# Patient Record
Sex: Male | Born: 1945
Health system: Southern US, Community
[De-identification: ages and names within clinical notes are randomized; demographics above are authoritative.]

## PROBLEM LIST (undated history)

## (undated) DIAGNOSIS — Z86010 Personal history of colonic polyps: Secondary | ICD-10-CM

## (undated) DIAGNOSIS — J449 Chronic obstructive pulmonary disease, unspecified: Secondary | ICD-10-CM

## (undated) DIAGNOSIS — K219 Gastro-esophageal reflux disease without esophagitis: Secondary | ICD-10-CM

## (undated) DIAGNOSIS — M545 Low back pain, unspecified: Secondary | ICD-10-CM

## (undated) DIAGNOSIS — H269 Unspecified cataract: Secondary | ICD-10-CM

## (undated) DIAGNOSIS — K5730 Diverticulosis of large intestine without perforation or abscess without bleeding: Secondary | ICD-10-CM

## (undated) DIAGNOSIS — I10 Essential (primary) hypertension: Secondary | ICD-10-CM

## (undated) DIAGNOSIS — E785 Hyperlipidemia, unspecified: Secondary | ICD-10-CM

## (undated) DIAGNOSIS — J189 Pneumonia, unspecified organism: Secondary | ICD-10-CM

## (undated) HISTORY — DX: Unspecified cataract: H26.9

## (undated) HISTORY — DX: Diverticulosis of large intestine without perforation or abscess without bleeding: K57.30

## (undated) HISTORY — DX: Gastro-esophageal reflux disease without esophagitis: K21.9

## (undated) HISTORY — PX: TONSILLECTOMY: SUR1361

## (undated) HISTORY — DX: Personal history of colonic polyps: Z86.010

## (undated) HISTORY — DX: Chronic obstructive pulmonary disease, unspecified: J44.9

## (undated) HISTORY — DX: Pneumonia, unspecified organism: J18.9

## (undated) HISTORY — PX: ESOPHAGOGASTRODUODENOSCOPY: SHX1529

## (undated) HISTORY — PX: CATARACT EXTRACTION: SUR2

## (undated) HISTORY — PX: COLONOSCOPY: SHX174

## (undated) HISTORY — DX: Hyperlipidemia, unspecified: E78.5

---
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
## 1966-11-07 DIAGNOSIS — J189 Pneumonia, unspecified organism: Secondary | ICD-10-CM

## 1966-11-07 HISTORY — DX: Pneumonia, unspecified organism: J18.9

## 1995-11-08 HISTORY — PX: HERNIA REPAIR: SHX51

## 2003-08-01 ENCOUNTER — Encounter: Payer: Self-pay | Admitting: Internal Medicine

## 2003-08-27 ENCOUNTER — Encounter: Payer: Self-pay | Admitting: General Surgery

## 2003-09-03 ENCOUNTER — Encounter (INDEPENDENT_AMBULATORY_CARE_PROVIDER_SITE_OTHER): Payer: Self-pay | Admitting: *Deleted

## 2003-09-03 ENCOUNTER — Ambulatory Visit (HOSPITAL_COMMUNITY): Admission: RE | Admit: 2003-09-03 | Discharge: 2003-09-03 | Payer: Self-pay | Admitting: General Surgery

## 2005-06-02 ENCOUNTER — Ambulatory Visit: Payer: Self-pay | Admitting: Internal Medicine

## 2005-06-06 ENCOUNTER — Ambulatory Visit: Payer: Self-pay | Admitting: Internal Medicine

## 2005-06-16 ENCOUNTER — Encounter: Admission: RE | Admit: 2005-06-16 | Discharge: 2005-09-14 | Payer: Self-pay | Admitting: Internal Medicine

## 2005-07-07 ENCOUNTER — Ambulatory Visit: Payer: Self-pay | Admitting: Internal Medicine

## 2005-09-23 ENCOUNTER — Ambulatory Visit: Payer: Self-pay | Admitting: Internal Medicine

## 2005-10-03 ENCOUNTER — Ambulatory Visit: Payer: Self-pay | Admitting: Internal Medicine

## 2006-02-06 ENCOUNTER — Ambulatory Visit: Payer: Self-pay | Admitting: Internal Medicine

## 2006-02-13 ENCOUNTER — Ambulatory Visit: Payer: Self-pay | Admitting: Internal Medicine

## 2006-06-15 ENCOUNTER — Ambulatory Visit: Payer: Self-pay | Admitting: Internal Medicine

## 2006-06-27 ENCOUNTER — Ambulatory Visit: Payer: Self-pay | Admitting: Internal Medicine

## 2006-08-23 ENCOUNTER — Ambulatory Visit: Payer: Self-pay | Admitting: Internal Medicine

## 2006-08-23 LAB — CONVERTED CEMR LAB: Hgb A1c MFr Bld: 7.7 % — ABNORMAL HIGH (ref 4.6–6.0)

## 2006-10-05 ENCOUNTER — Ambulatory Visit: Payer: Self-pay | Admitting: Internal Medicine

## 2006-11-13 ENCOUNTER — Ambulatory Visit: Payer: Self-pay | Admitting: Internal Medicine

## 2007-01-30 ENCOUNTER — Ambulatory Visit: Payer: Self-pay | Admitting: Internal Medicine

## 2007-01-30 LAB — CONVERTED CEMR LAB
ALT: 25 units/L (ref 0–40)
AST: 22 units/L (ref 0–37)
BUN: 17 mg/dL (ref 6–23)
Cholesterol: 103 mg/dL (ref 0–200)
Creatinine, Ser: 1 mg/dL (ref 0.4–1.5)
Creatinine,U: 227.4 mg/dL
HDL: 39.2 mg/dL (ref >39.0)
Hgb A1c MFr Bld: 7.8 % — ABNORMAL HIGH (ref 4.6–6.0)
LDL Cholesterol: 46 mg/dL (ref 0–99)
Microalb Creat Ratio: 5.3 mg/g (ref 0.0–30.0)
Microalb, Ur: 1.2 mg/dL (ref 0.0–1.9)
Potassium: 4.3 meq/L (ref 3.5–5.1)
Total CHOL/HDL Ratio: 2.6
Triglycerides: 87 mg/dL (ref 0–149)
VLDL: 17 mg/dL (ref 0–40)

## 2007-07-06 ENCOUNTER — Ambulatory Visit: Payer: Self-pay | Admitting: Internal Medicine

## 2007-07-06 DIAGNOSIS — R972 Elevated prostate specific antigen [PSA]: Secondary | ICD-10-CM | POA: Insufficient documentation

## 2007-07-06 LAB — CONVERTED CEMR LAB
Cholesterol, target level: 200 mg/dL
HDL goal, serum: 40 mg/dL
LDL Goal: 100 mg/dL

## 2007-07-09 ENCOUNTER — Encounter: Payer: Self-pay | Admitting: Internal Medicine

## 2007-07-09 LAB — CONVERTED CEMR LAB
ALT: 24 units/L (ref 0–53)
AST: 19 units/L (ref 0–37)
Albumin: 4.4 g/dL (ref 3.5–5.2)
Alkaline Phosphatase: 91 units/L (ref 39–117)
BUN: 7 mg/dL (ref 6–23)
Basophils Absolute: 0 10*3/uL (ref 0.0–0.1)
Basophils Relative: 0 % (ref 0.0–1.0)
Bilirubin, Direct: 0.1 mg/dL (ref 0.0–0.3)
CO2: 29 meq/L (ref 19–32)
Calcium: 10.7 mg/dL — ABNORMAL HIGH (ref 8.4–10.5)
Chloride: 102 meq/L (ref 96–112)
Cholesterol: 187 mg/dL (ref 0–200)
Creatinine, Ser: 1 mg/dL (ref 0.4–1.5)
Creatinine,U: 57.1 mg/dL
Eosinophils Absolute: 0.1 10*3/uL (ref 0.0–0.6)
Eosinophils Relative: 1.7 % (ref 0.0–5.0)
GFR calc Af Amer: 98 mL/min
GFR calc non Af Amer: 81 mL/min
Glucose, Bld: 203 mg/dL — ABNORMAL HIGH (ref 70–99)
HCT: 49.6 % (ref 39.0–52.0)
HDL: 35.5 mg/dL — ABNORMAL LOW (ref >39.0)
Hemoglobin: 16.6 g/dL (ref 13.0–17.0)
Hgb A1c MFr Bld: 10.2 % — ABNORMAL HIGH (ref 4.6–6.0)
LDL Cholesterol: 130 mg/dL — ABNORMAL HIGH (ref 0–99)
Lymphocytes Relative: 26.1 % (ref 12.0–46.0)
MCHC: 33.5 g/dL (ref 30.0–36.0)
MCV: 87.2 fL (ref 78.0–100.0)
Microalb Creat Ratio: 8.8 mg/g (ref 0.0–30.0)
Microalb, Ur: 0.5 mg/dL (ref 0.0–1.9)
Monocytes Absolute: 0.8 10*3/uL — ABNORMAL HIGH (ref 0.2–0.7)
Monocytes Relative: 11 % (ref 3.0–11.0)
Neutro Abs: 4.7 10*3/uL (ref 1.4–7.7)
Neutrophils Relative %: 61.2 % (ref 43.0–77.0)
Platelets: 248 10*3/uL (ref 150–400)
Potassium: 4.1 meq/L (ref 3.5–5.1)
RBC: 5.69 M/uL (ref 4.22–5.81)
RDW: 13.2 % (ref 11.5–14.6)
Sodium: 139 meq/L (ref 135–145)
TSH: 1.56 microintl units/mL (ref 0.35–5.50)
Total Bilirubin: 0.7 mg/dL (ref 0.3–1.2)
Total CHOL/HDL Ratio: 5.3
Total Protein: 6.9 g/dL (ref 6.0–8.3)
Triglycerides: 110 mg/dL (ref 0–149)
VLDL: 22 mg/dL (ref 0–40)
WBC: 7.6 10*3/uL (ref 4.5–10.5)

## 2007-07-10 ENCOUNTER — Encounter (INDEPENDENT_AMBULATORY_CARE_PROVIDER_SITE_OTHER): Payer: Self-pay | Admitting: *Deleted

## 2007-10-24 ENCOUNTER — Encounter: Payer: Self-pay | Admitting: Internal Medicine

## 2007-11-15 ENCOUNTER — Encounter: Payer: Self-pay | Admitting: Internal Medicine

## 2008-01-09 ENCOUNTER — Encounter: Payer: Self-pay | Admitting: Internal Medicine

## 2008-07-17 ENCOUNTER — Ambulatory Visit: Payer: Self-pay | Admitting: Internal Medicine

## 2008-07-17 DIAGNOSIS — E1165 Type 2 diabetes mellitus with hyperglycemia: Secondary | ICD-10-CM

## 2008-07-17 DIAGNOSIS — E785 Hyperlipidemia, unspecified: Secondary | ICD-10-CM

## 2008-07-17 DIAGNOSIS — IMO0001 Reserved for inherently not codable concepts without codable children: Secondary | ICD-10-CM | POA: Insufficient documentation

## 2008-07-17 DIAGNOSIS — E782 Mixed hyperlipidemia: Secondary | ICD-10-CM | POA: Insufficient documentation

## 2008-07-17 DIAGNOSIS — Z860100 Personal history of colon polyps, unspecified: Secondary | ICD-10-CM

## 2008-07-17 DIAGNOSIS — Z860101 Personal history of adenomatous and serrated colon polyps: Secondary | ICD-10-CM

## 2008-07-17 DIAGNOSIS — I10 Essential (primary) hypertension: Secondary | ICD-10-CM

## 2008-07-17 HISTORY — DX: Personal history of colonic polyps: Z86.010

## 2008-07-17 HISTORY — DX: Personal history of adenomatous and serrated colon polyps: Z86.0101

## 2008-07-17 HISTORY — DX: Personal history of colon polyps, unspecified: Z86.0100

## 2008-07-29 ENCOUNTER — Ambulatory Visit: Payer: Self-pay | Admitting: Internal Medicine

## 2008-08-04 ENCOUNTER — Encounter (INDEPENDENT_AMBULATORY_CARE_PROVIDER_SITE_OTHER): Payer: Self-pay | Admitting: *Deleted

## 2008-08-04 LAB — CONVERTED CEMR LAB
ALT: 20 units/L (ref 0–53)
AST: 18 units/L (ref 0–37)
Albumin: 4 g/dL (ref 3.5–5.2)
Alkaline Phosphatase: 91 units/L (ref 39–117)
BUN: 9 mg/dL (ref 6–23)
Basophils Absolute: 0 10*3/uL (ref 0.0–0.1)
Basophils Relative: 0 % (ref 0.0–3.0)
Bilirubin, Direct: 0.1 mg/dL (ref 0.0–0.3)
Cholesterol: 180 mg/dL (ref 0–200)
Creatinine, Ser: 0.9 mg/dL (ref 0.4–1.5)
Creatinine,U: 241.2 mg/dL
Eosinophils Absolute: 0.1 10*3/uL (ref 0.0–0.7)
Eosinophils Relative: 0.9 % (ref 0.0–5.0)
HCT: 49.8 % (ref 39.0–52.0)
HDL: 34.4 mg/dL — ABNORMAL LOW (ref >39.0)
Hemoglobin: 16.7 g/dL (ref 13.0–17.0)
Hgb A1c MFr Bld: 9.8 % — ABNORMAL HIGH (ref 4.6–6.0)
LDL Cholesterol: 130 mg/dL — ABNORMAL HIGH (ref 0–99)
Lymphocytes Relative: 20.7 % (ref 12.0–46.0)
MCHC: 33.5 g/dL (ref 30.0–36.0)
MCV: 87.9 fL (ref 78.0–100.0)
Microalb Creat Ratio: 25.3 mg/g (ref 0.0–30.0)
Microalb, Ur: 6.1 mg/dL — ABNORMAL HIGH (ref 0.0–1.9)
Monocytes Absolute: 1.1 10*3/uL — ABNORMAL HIGH (ref 0.1–1.0)
Monocytes Relative: 10.7 % (ref 3.0–12.0)
Neutro Abs: 6.8 10*3/uL (ref 1.4–7.7)
Neutrophils Relative %: 67.7 % (ref 43.0–77.0)
Platelets: 238 10*3/uL (ref 150–400)
RBC: 5.67 M/uL (ref 4.22–5.81)
RDW: 13.7 % (ref 11.5–14.6)
TSH: 1.28 microintl units/mL (ref 0.35–5.50)
Total Bilirubin: 0.8 mg/dL (ref 0.3–1.2)
Total CHOL/HDL Ratio: 5.2
Total Protein: 7.1 g/dL (ref 6.0–8.3)
Triglycerides: 79 mg/dL (ref 0–149)
VLDL: 16 mg/dL (ref 0–40)
WBC: 10.1 10*3/uL (ref 4.5–10.5)

## 2008-10-10 ENCOUNTER — Ambulatory Visit: Payer: Self-pay | Admitting: Internal Medicine

## 2008-10-10 DIAGNOSIS — R1319 Other dysphagia: Secondary | ICD-10-CM | POA: Insufficient documentation

## 2008-10-14 ENCOUNTER — Ambulatory Visit (HOSPITAL_COMMUNITY): Admission: RE | Admit: 2008-10-14 | Discharge: 2008-10-14 | Payer: Self-pay | Admitting: Internal Medicine

## 2008-11-11 ENCOUNTER — Encounter: Payer: Self-pay | Admitting: Internal Medicine

## 2008-11-12 ENCOUNTER — Encounter: Payer: Self-pay | Admitting: Internal Medicine

## 2008-11-12 ENCOUNTER — Ambulatory Visit: Payer: Self-pay | Admitting: Internal Medicine

## 2008-11-18 ENCOUNTER — Encounter: Payer: Self-pay | Admitting: Internal Medicine

## 2009-01-12 ENCOUNTER — Telehealth (INDEPENDENT_AMBULATORY_CARE_PROVIDER_SITE_OTHER): Payer: Self-pay | Admitting: *Deleted

## 2009-01-21 ENCOUNTER — Encounter (INDEPENDENT_AMBULATORY_CARE_PROVIDER_SITE_OTHER): Payer: Self-pay | Admitting: *Deleted

## 2009-03-24 ENCOUNTER — Emergency Department (HOSPITAL_COMMUNITY): Admission: EM | Admit: 2009-03-24 | Discharge: 2009-03-24 | Payer: Self-pay | Admitting: Family Medicine

## 2009-03-28 ENCOUNTER — Emergency Department (HOSPITAL_COMMUNITY): Admission: EM | Admit: 2009-03-28 | Discharge: 2009-03-28 | Payer: Self-pay | Admitting: Family Medicine

## 2009-04-29 ENCOUNTER — Telehealth (INDEPENDENT_AMBULATORY_CARE_PROVIDER_SITE_OTHER): Payer: Self-pay | Admitting: *Deleted

## 2009-05-07 ENCOUNTER — Telehealth (INDEPENDENT_AMBULATORY_CARE_PROVIDER_SITE_OTHER): Payer: Self-pay | Admitting: *Deleted

## 2009-05-07 ENCOUNTER — Ambulatory Visit: Payer: Self-pay | Admitting: Internal Medicine

## 2009-05-19 ENCOUNTER — Encounter (INDEPENDENT_AMBULATORY_CARE_PROVIDER_SITE_OTHER): Payer: Self-pay | Admitting: *Deleted

## 2009-05-19 LAB — CONVERTED CEMR LAB
ALT: 19 units/L (ref 0–53)
AST: 22 units/L (ref 0–37)
Albumin: 4 g/dL (ref 3.5–5.2)
Alkaline Phosphatase: 100 units/L (ref 39–117)
BUN: 11 mg/dL (ref 6–23)
Basophils Absolute: 0 10*3/uL (ref 0.0–0.1)
Basophils Relative: 0 % (ref 0.0–3.0)
Bilirubin, Direct: 0.1 mg/dL (ref 0.0–0.3)
CO2: 27 meq/L (ref 19–32)
Calcium: 9.8 mg/dL (ref 8.4–10.5)
Chloride: 106 meq/L (ref 96–112)
Cholesterol: 115 mg/dL (ref 0–200)
Creatinine, Ser: 0.8 mg/dL (ref 0.4–1.5)
Creatinine,U: 165.2 mg/dL
Eosinophils Absolute: 0.2 10*3/uL (ref 0.0–0.7)
Eosinophils Relative: 1.9 % (ref 0.0–5.0)
GFR calc non Af Amer: 103.57 mL/min (ref >60)
Glucose, Bld: 139 mg/dL — ABNORMAL HIGH (ref 70–99)
HCT: 44.3 % (ref 39.0–52.0)
HDL: 40.5 mg/dL (ref >39.00)
Hemoglobin: 15.4 g/dL (ref 13.0–17.0)
Hgb A1c MFr Bld: 9 % — ABNORMAL HIGH (ref 4.6–6.5)
LDL Cholesterol: 59 mg/dL (ref 0–99)
Lymphocytes Relative: 23.4 % (ref 12.0–46.0)
Lymphs Abs: 1.9 10*3/uL (ref 0.7–4.0)
MCHC: 34.8 g/dL (ref 30.0–36.0)
MCV: 85.5 fL (ref 78.0–100.0)
Microalb Creat Ratio: 38.7 mg/g — ABNORMAL HIGH (ref 0.0–30.0)
Microalb, Ur: 6.4 mg/dL — ABNORMAL HIGH (ref 0.0–1.9)
Monocytes Absolute: 0.8 10*3/uL (ref 0.1–1.0)
Monocytes Relative: 9.3 % (ref 3.0–12.0)
Neutro Abs: 5.4 10*3/uL (ref 1.4–7.7)
Neutrophils Relative %: 65.4 % (ref 43.0–77.0)
Platelets: 219 10*3/uL (ref 150.0–400.0)
Potassium: 4.2 meq/L (ref 3.5–5.1)
RBC: 5.18 M/uL (ref 4.22–5.81)
RDW: 14.5 % (ref 11.5–14.6)
Sodium: 141 meq/L (ref 135–145)
TSH: 1.01 microintl units/mL (ref 0.35–5.50)
Total Bilirubin: 0.7 mg/dL (ref 0.3–1.2)
Total CHOL/HDL Ratio: 3
Total Protein: 6.9 g/dL (ref 6.0–8.3)
Triglycerides: 77 mg/dL (ref 0.0–149.0)
VLDL: 15.4 mg/dL (ref 0.0–40.0)
WBC: 8.3 10*3/uL (ref 4.5–10.5)

## 2009-05-22 ENCOUNTER — Ambulatory Visit: Payer: Self-pay | Admitting: Internal Medicine

## 2009-05-22 DIAGNOSIS — K219 Gastro-esophageal reflux disease without esophagitis: Secondary | ICD-10-CM | POA: Insufficient documentation

## 2009-08-25 ENCOUNTER — Telehealth (INDEPENDENT_AMBULATORY_CARE_PROVIDER_SITE_OTHER): Payer: Self-pay | Admitting: *Deleted

## 2009-09-28 ENCOUNTER — Telehealth (INDEPENDENT_AMBULATORY_CARE_PROVIDER_SITE_OTHER): Payer: Self-pay | Admitting: *Deleted

## 2009-11-19 ENCOUNTER — Telehealth: Payer: Self-pay | Admitting: Internal Medicine

## 2009-11-20 ENCOUNTER — Encounter (INDEPENDENT_AMBULATORY_CARE_PROVIDER_SITE_OTHER): Payer: Self-pay | Admitting: *Deleted

## 2009-12-01 ENCOUNTER — Encounter (INDEPENDENT_AMBULATORY_CARE_PROVIDER_SITE_OTHER): Payer: Self-pay | Admitting: *Deleted

## 2009-12-02 ENCOUNTER — Ambulatory Visit: Payer: Self-pay | Admitting: Internal Medicine

## 2009-12-02 ENCOUNTER — Encounter (INDEPENDENT_AMBULATORY_CARE_PROVIDER_SITE_OTHER): Payer: Self-pay | Admitting: *Deleted

## 2009-12-14 ENCOUNTER — Ambulatory Visit: Payer: Self-pay | Admitting: Internal Medicine

## 2009-12-18 ENCOUNTER — Encounter: Payer: Self-pay | Admitting: Internal Medicine

## 2010-01-04 ENCOUNTER — Telehealth (INDEPENDENT_AMBULATORY_CARE_PROVIDER_SITE_OTHER): Payer: Self-pay | Admitting: *Deleted

## 2010-01-12 ENCOUNTER — Telehealth (INDEPENDENT_AMBULATORY_CARE_PROVIDER_SITE_OTHER): Payer: Self-pay | Admitting: *Deleted

## 2010-01-28 ENCOUNTER — Encounter: Payer: Self-pay | Admitting: Internal Medicine

## 2010-02-10 ENCOUNTER — Telehealth: Payer: Self-pay | Admitting: Internal Medicine

## 2010-04-12 ENCOUNTER — Telehealth (INDEPENDENT_AMBULATORY_CARE_PROVIDER_SITE_OTHER): Payer: Self-pay | Admitting: *Deleted

## 2010-08-17 ENCOUNTER — Encounter: Payer: Self-pay | Admitting: Internal Medicine

## 2010-08-19 ENCOUNTER — Telehealth (INDEPENDENT_AMBULATORY_CARE_PROVIDER_SITE_OTHER): Payer: Self-pay | Admitting: *Deleted

## 2010-10-11 ENCOUNTER — Encounter: Payer: Self-pay | Admitting: Internal Medicine

## 2010-10-14 ENCOUNTER — Ambulatory Visit: Payer: Self-pay | Admitting: Internal Medicine

## 2010-10-14 ENCOUNTER — Encounter: Payer: Self-pay | Admitting: Internal Medicine

## 2010-10-14 DIAGNOSIS — I455 Other specified heart block: Secondary | ICD-10-CM | POA: Insufficient documentation

## 2010-10-15 LAB — CONVERTED CEMR LAB
ALT: 19 units/L (ref 0–53)
AST: 19 units/L (ref 0–37)
Albumin: 4.1 g/dL (ref 3.5–5.2)
Basophils Relative: 0.1 % (ref 0.0–3.0)
Cholesterol: 121 mg/dL (ref 0–200)
Eosinophils Relative: 0 % (ref 0.0–5.0)
GFR calc non Af Amer: 59.94 mL/min — ABNORMAL LOW (ref >60.00)
HCT: 45.9 % (ref 39.0–52.0)
HDL: 31.3 mg/dL — ABNORMAL LOW (ref >39.00)
Hemoglobin: 15.7 g/dL (ref 13.0–17.0)
Lymphs Abs: 0.8 10*3/uL (ref 0.7–4.0)
Monocytes Relative: 9.2 % (ref 3.0–12.0)
Neutro Abs: 8.8 10*3/uL — ABNORMAL HIGH (ref 1.4–7.7)
Potassium: 3.5 meq/L (ref 3.5–5.1)
RBC: 5.27 M/uL (ref 4.22–5.81)
TSH: 1.75 microintl units/mL (ref 0.35–5.50)
Total Bilirubin: 0.6 mg/dL (ref 0.3–1.2)
Total CHOL/HDL Ratio: 4
Triglycerides: 170 mg/dL — ABNORMAL HIGH (ref 0.0–149.0)
WBC: 10.6 10*3/uL — ABNORMAL HIGH (ref 4.5–10.5)

## 2010-10-16 ENCOUNTER — Emergency Department (HOSPITAL_COMMUNITY)
Admission: EM | Admit: 2010-10-16 | Discharge: 2010-10-16 | Payer: Self-pay | Source: Home / Self Care | Admitting: Emergency Medicine

## 2010-10-19 ENCOUNTER — Encounter: Payer: Self-pay | Admitting: Internal Medicine

## 2010-10-21 ENCOUNTER — Encounter: Payer: Self-pay | Admitting: Cardiology

## 2010-10-22 ENCOUNTER — Ambulatory Visit: Payer: Self-pay | Admitting: Cardiology

## 2010-10-22 DIAGNOSIS — R9431 Abnormal electrocardiogram [ECG] [EKG]: Secondary | ICD-10-CM

## 2010-10-22 DIAGNOSIS — R079 Chest pain, unspecified: Secondary | ICD-10-CM

## 2010-11-02 ENCOUNTER — Telehealth (INDEPENDENT_AMBULATORY_CARE_PROVIDER_SITE_OTHER): Payer: Self-pay | Admitting: *Deleted

## 2010-11-03 ENCOUNTER — Encounter (HOSPITAL_COMMUNITY)
Admission: RE | Admit: 2010-11-03 | Discharge: 2010-12-07 | Payer: Self-pay | Source: Home / Self Care | Attending: Cardiology | Admitting: Cardiology

## 2010-11-03 ENCOUNTER — Ambulatory Visit (HOSPITAL_COMMUNITY)
Admission: RE | Admit: 2010-11-03 | Discharge: 2010-11-03 | Payer: Self-pay | Source: Home / Self Care | Attending: Cardiology | Admitting: Cardiology

## 2010-11-03 ENCOUNTER — Encounter: Payer: Self-pay | Admitting: Cardiology

## 2010-11-03 ENCOUNTER — Ambulatory Visit: Payer: Self-pay

## 2010-11-11 ENCOUNTER — Telehealth: Payer: Self-pay | Admitting: Cardiology

## 2010-11-18 ENCOUNTER — Ambulatory Visit: Admission: RE | Admit: 2010-11-18 | Discharge: 2010-11-18 | Payer: Self-pay | Source: Home / Self Care

## 2010-12-07 NOTE — Assessment & Plan Note (Signed)
Summary: CPX/FASTING//KN   Vital Signs:  Patient profile:   65 year old male Height:      69.25 inches Weight:      239.8 pounds BMI:     35.28 Temp:     98.0 degrees F oral Pulse rate:   76 / minute Resp:     16 per minute BP sitting:   116 / 68  (left arm) Cuff size:   large  Vitals Entered By: Shonna Chock CMA (October 14, 2010 10:52 AM) CC: CPX with fasting labs , discuss pneumonia vaccine, Type 2 diabetes mellitus follow-up Comments Refused Flu Vaccine     Primary Care Provider:  Marga Melnick, MD  CC:  CPX with fasting labs , discuss pneumonia vaccine, and Type 2 diabetes mellitus follow-up.  History of Present Illness: Here for Medicare AWV: 1.Risk factors based on Past M, S, F history:DM; HTN; Dyslipidemia; ERD ( chart updated) 2.Physical Activities: no regular exercise 3.Depression/mood: no issues 4.Hearing: whisper heard @ 6 ft 5.ADL's: no limitations 6.Fall Risk: no issues 7.Home Safety: no risk  8.Height, weight, &visual acuity:wall chart read @ 6 ft w/o lenses 9.Counseling: POA & Living Will not in place  10.Labs ordered based on risk factors: see Orders  11.  Referral Coordination: none requested 12. Care Plan: see Instructions 13.Cognitive Assessment: Oriented X 3 ; memory & recall  excellent  ; "WORLD" spelled  backwards ; mood & affect normal. Type 2 Diabetes Mellitus Follow-Up:    The patient reports  rare self managed hypoglycemia, but denies polyuria, polydipsia, blurred vision, weight loss, weight gain, and numbness of extremities.  Other symptoms include orthostatic symptoms after squatting.  The patient denies the following symptoms: neuropathic pain, chest pain, vomiting, poor wound healing, intermittent claudication, vision loss, and foot ulcer.  Since the last visit the patient reports  fair  dietary compliance.  The patient has been measuring capillary blood glucose before breakfast , 130 on aveage and 2 hrs after dinner, 175-200.Last A1c 8% in  09/11 @ Dr Willeen Cass.  Since the last visit, the patient reports having had eye care by an ophthalmologist ( no retinopathy) but  no foot care.   Hyperlipidemia Follow-Up: The patient denies muscle aches, GI upset, abdominal pain, flushing, itching, constipation, diarrhea, and fatigue.  Other symptoms include syncope X 1 in context of choking  due to coughing. PMH of esophageal stricture, S/P dilation. The patient denies the following symptoms: dypsnea, palpitations, and pedal edema.  Compliance with medications (by patient report) has been near 100%.  Adjunctive measures currently used by the patient include ASA and fish oil supplements.    Preventive Screening-Counseling & Management  Alcohol-Tobacco     Alcohol drinks/day: < 1     Smoking Status: quit > 6 months     Year Started: 1996     Cigars/week: 10 / week     Tobacco Counseling: to quit use of tobacco products  Caffeine-Diet-Exercise     Caffeine use/day: 1 cup     Diet Comments: low carb  Hep-HIV-STD-Contraception     Dental Visit-last 6 months no     Dental Care Counseling: dentures     Sun Exposure-Excessive: no  Safety-Violence-Falls     Seat Belt Use: yes     Smoke Detectors: yes      Blood Transfusions:  no.        Travel History:  [place] 2008.    Current Medications (verified): 1)  Novolog Mix 70/30 70-30 % Susp (Insulin Aspart  Prot & Aspart) .... 40 Units Am 30 Units Pm 2)  Metformin Hcl 1000 Mg  Tabs (Metformin Hcl) .Marland Kitchen.. 1 By Mouth Two Times A Day With Meals **appointment Due** 3)  Freestyle Test   Strp (Glucose Blood) .... Test Twice Daily 4)  Ramipril 10 Mg  Caps (Ramipril) .... Take One Capsule Daily 5)  Adult Aspirin Low Strength 81 Mg Tbdp (Aspirin) .Marland Kitchen.. 1 By Mouth Once Daily 6)  Multivitamins   Tabs (Multiple Vitamin) .Marland Kitchen.. 1 By Mouth Once Daily 7)  Fish Oil 1000 Mg Caps (Omega-3 Fatty Acids) .Marland Kitchen.. 1 By Mouth Once Daily 8)  Vytorin 10-20 Mg Tabs (Ezetimibe-Simvastatin) .... Take As Directed **labs Due** 9)   Prilosec 40 Mg  Cpdr (Omeprazole) .Marland Kitchen.. 1 Each Day 30 Minutes Before Breakfast 10)  Losartan Potassium-Hctz 100-25 Mg Tabs (Losartan Potassium-Hctz) .Marland Kitchen.. 1 By Mouth Once Daily  Allergies (verified): No Known Drug Allergies  Past History:  Past Medical History: Elevated PSA ( max 9.5), Dr Retta Diones Diabetes mellitus, type II, Dr Talmage Nap Hyperlipidemia Hypertension Colonic polyps, PMH  of 2004,2010, Dr Leone Payor Diverticulosis, colon [phone] GERD with erosive esophagitis & gastritis @ Endo 2010  Past Surgical History: Prostate biopsy X 3, Dr Retta Diones ;Coccyxectomy Appendectomy Colon polypectomy 2004, 2010; Esophageal Dilation 11/2008, Dr Leone Payor Inguinal herniorrhaphy Rotator cuff repair Tonsillectomy  Family History: Father: CVA in  late 60s,CHF Mother: Alzheimer's Siblings: bro: DM ,  PTE  post  gastric bypass; sister :DDD  Social History: Married with 2 children (boys) Occupation: Systems developer Current Smoker: 10 cigars / week Alcohol use-yes Smoking Status:  quit > 6 months Caffeine use/day:  1 cup Dental Care w/in 6 mos.:  no Sun Exposure-Excessive:  no Seat Belt Use:  yes Blood Transfusions:  no  Review of Systems  The patient denies anorexia, fever, vision loss, decreased hearing, hoarseness, prolonged cough, hemoptysis, melena, hematochezia, severe indigestion/heartburn, suspicious skin lesions, unusual weight change, abnormal bleeding, enlarged lymph nodes, and angioedema.    Physical Exam  General:  alert,appropriate and cooperative throughout examination Head:  Normocephalic and atraumatic without obvious abnormalities. No apparent alopecia ; moustache & beard Eyes:  No corneal or conjunctival inflammation noted. Perrla. Funduscopic exam benign, without hemorrhages, exudates or papilledema.  Ears:  External ear exam shows no significant lesions or deformities.  Otoscopic examination reveals clear canals, tympanic membranes are intact bilaterally  without bulging, retraction, inflammation or discharge. Hearing is grossly normal bilaterally. Nose:  External nasal examination shows no deformity or inflammation. Nasal mucosa are pink and moist without lesions or exudates. Mouth:  Oral mucosa and oropharynx without lesions or exudates.   Dentures. Marked pharyngeal erythema.   Neck:  No deformities, masses, or tenderness noted. Lungs:  Normal respiratory effort, chest expands symmetrically. Lungs are clear to auscultation, no crackles or wheezes. Heart:  Normal rate and regular rhythm. S1 and S2 normal without gallop, murmur, click, rub. S4 Abdomen:  Bowel sounds positive,abdomen soft and non-tender without masses, organomegaly or hernias noted. Genitalia:  Dr  Retta Diones Msk:  No deformity or scoliosis noted of thoracic or lumbar spine.   Pulses:  R and L carotid,radial,dorsalis pedis and posterior tibial pulses are full and equal bilaterally Extremities:  No clubbing, cyanosis, edema. Chronic finger & toenail fungal changes Neurologic:  alert & oriented X3, sensation intact to light touch over feet, and DTRs symmetrical and normal.   Skin:  Soles dry. Tatoo LUE Cervical Nodes:  No lymphadenopathy noted Axillary Nodes:  No palpable lymphadenopathy Psych:  memory intact for  recent and remote, normally interactive, and good eye contact.     Impression & Recommendations:  Problem # 1:  PREVENTIVE HEALTH CARE (ICD-V70.0)  Orders: Welcome to [employer], Physical [phone])  Problem # 2:  OTHER HEART BLOCK (ICD-426.6)  Bifasicular block, new LAD since 2007  Orders: EKG w/ Interpretation (93000) Cardiology Referral (Cardiology)  Problem # 3:  GERD (ICD-530.81)  controlled His updated medication list for this problem includes:    Prilosec 40 Mg Cpdr (Omeprazole) .Marland Kitchen... 1 each day 30 minutes before breakfast  Orders: Venipuncture (60454) TLB-CBC Platelet - w/Differential (85025-CBCD) Specimen Handling (09811)  Problem # 4:  DIABETES  MELLITUS, TYPE II, UNCONTROLLED (ICD-250.02) as per Dr Talmage Nap His updated medication list for this problem includes:    Novolog Mix 70/30 70-30 % Susp (Insulin aspart prot & aspart) .Marland KitchenMarland KitchenMarland KitchenMarland Kitchen 40 units am 30 units pm    Metformin Hcl 1000 Mg Tabs (Metformin hcl) .Marland Kitchen... 1 by mouth two times a day with meals    Ramipril 10 Mg Caps (Ramipril) .Marland Kitchen... Take one capsule daily    Adult Aspirin Low Strength 81 Mg Tbdp (Aspirin) .Marland Kitchen... 1 by mouth once daily    Losartan Potassium-hctz 100-25 Mg Tabs (Losartan potassium-hctz) .Marland Kitchen... 1 by mouth once daily  Problem # 5:  HYPERTENSION (ICD-401.9)  controlled His updated medication list for this problem includes:    Ramipril 10 Mg Caps (Ramipril) .Marland Kitchen... Take one capsule daily    Losartan Potassium-hctz 100-25 Mg Tabs (Losartan potassium-hctz) .Marland Kitchen... 1 by mouth once daily  Orders: TLB-BMP (Basic Metabolic Panel-BMET) (80048-METABOL) Specimen Handling (91478)  Problem # 6:  HYPERLIPIDEMIA (ICD-272.4)  His updated medication list for this problem includes:    Vytorin 10-20 Mg Tabs (Ezetimibe-simvastatin) .Marland Kitchen... Take as directed  Orders: Venipuncture (29562) TLB-Lipid Panel (80061-LIPID) TLB-Hepatic/Liver Function Pnl (80076-HEPATIC) TLB-TSH (Thyroid Stimulating Hormone) (84443-TSH) Specimen Handling (13086)  Problem # 7:  ELEVATED PROSTATE SPECIFIC ANTIGEN (ICD-790.93) as per Dr Retta Diones  Complete Medication List: 1)  Novolog Mix 70/30 70-30 % Susp (Insulin aspart prot & aspart) .... 40 units am 30 units pm 2)  Metformin Hcl 1000 Mg Tabs (Metformin hcl) .Marland Kitchen.. 1 by mouth two times a day with meals 3)  Freestyle Test Strp (Glucose blood) .... Test twice daily 4)  Ramipril 10 Mg Caps (Ramipril) .... Take one capsule daily 5)  Adult Aspirin Low Strength 81 Mg Tbdp (Aspirin) .Marland Kitchen.. 1 by mouth once daily 6)  Multivitamins Tabs (Multiple vitamin) .Marland Kitchen.. 1 by mouth once daily 7)  Fish Oil 1000 Mg Caps (Omega-3 fatty acids) .Marland Kitchen.. 1 by mouth once daily 8)  Vytorin 10-20  Mg Tabs (Ezetimibe-simvastatin) .... Take as directed 9)  Prilosec 40 Mg Cpdr (Omeprazole) .Marland Kitchen.. 1 each day 30 minutes before breakfast 10)  Losartan Potassium-hctz 100-25 Mg Tabs (Losartan potassium-hctz) .Marland Kitchen.. 1 by mouth once daily  Patient Instructions: 1)  Consider POA & Living Will. Annual Flu shot recommended. 2)  Stop Smoking Tips: Choose a Quit date. Cut down before the Quit date. decide what you will do as a substitute when you feel the urge to smoke(gum,toothpick,exercise). 3)  It is important that you exercise regularly at least 20 minutes 5 times a week. If you develop chest pain, have severe difficulty breathing, or feel very tired , stop exercising immediately and seek medical attention. 4)  Check your feet each night for sore areas, calluses or signs of infection.Call if Podiatry referral desired. 5)  Check your Blood Pressure regularly. If it is above: 135/85 ON AVERAGE  you should  make an appointment. Prescriptions: METFORMIN HCL 1000 MG  TABS (METFORMIN HCL) 1 by mouth two times a day with Meals  #180 x 3   Entered and Authorized by:   Marga Melnick MD   Signed by:   Marga Melnick MD on 10/14/2010   Method used:   Print then Give to Patient   RxID:   [id number] VYTORIN 10-20 MG TABS (EZETIMIBE-SIMVASTATIN) Take as directed  #90 x 3   Entered and Authorized by:   Marga Melnick MD   Signed by:   Marga Melnick MD on 10/14/2010   Method used:   Print then Give to Patient   RxID:   [id number]    Orders Added: 1)  Welcome to Medicare, Physical [G0402] 2)  Est. Patient Level III [84132] 3)  EKG w/ Interpretation [93000] 4)  Venipuncture [36415] 5)  TLB-Lipid Panel [80061-LIPID] 6)  TLB-BMP (Basic Metabolic Panel-BMET) [80048-METABOL] 7)  TLB-CBC Platelet - w/Differential [85025-CBCD] 8)  TLB-Hepatic/Liver Function Pnl [80076-HEPATIC] 9)  TLB-TSH (Thyroid Stimulating Hormone) [84443-TSH] 10)  Specimen Handling [99000] 11)  Cardiology Referral  [Cardiology]

## 2010-12-07 NOTE — Letter (Signed)
Summary: Diabetic Instructions  [facility]  [address] [place], [place] [address]   Phone: [phone]  Fax: [phone]    Karl Lawson 1946/07/03 MRN: [id number]   _  _   ORAL DIABETIC MEDICATION INSTRUCTIONS  The day before your procedure:   Take your diabetic pill as you do normally  The day of your procedure:   Do not take your diabetic pill    We will check your blood sugar levels during the admission process and again in Recovery before discharging you home  ________________________________________________________________________  _  _   INSULIN (LONG ACTING) MEDICATION INSTRUCTIONS (Lantus, NPH, 70/30, Humulin, Novolin-N)   The day before your procedure:   Take  your regular evening dose    The day of your procedure:   Do not take your morning dose

## 2010-12-07 NOTE — Progress Notes (Signed)
Summary: Refill Request  Phone Note Refill Request Call back at [phone] Message from:  Pharmacy on April 12, 2010 4:14 PM  Refills Requested: Medication #1:  VYTORIN 10-20 MG TABS Take as directed   Dosage confirmed as above?Dosage Confirmed   [facility] [address]  Initial call taken by: Harold Barban,  April 12, 2010 4:14 PM    Prescriptions: VYTORIN 10-20 MG TABS (EZETIMIBE-SIMVASTATIN) Take as directed  #90 x 0   Entered by:   Shonna Chock   Authorized by:   Marga Melnick MD   Signed by:   Shonna Chock on 04/12/2010   Method used:   [facility]        [facility]  [address] [phone][facility] ([facility]ail)       [address]       [place], [place]  [address]       Ph: [phone] or [phone]       Fax: [phone]   RxID:   [id number]

## 2010-12-07 NOTE — Progress Notes (Signed)
Summary: vytorin refil   Phone Note Refill Request Message from:  Patient on January 12, 2010 3:40 PM  Refills Requested: Medication #1:  VYTORIN 10-20 MG TABS Take as directed [facility] [address]   Initial call taken by: Doristine Devoid,  January 12, 2010 3:40 PM    Prescriptions: VYTORIN 10-20 MG TABS (EZETIMIBE-SIMVASTATIN) Take as directed  #90 x 0   Entered by:   Doristine Devoid   Authorized by:   Marga Melnick MD   Signed by:   Doristine Devoid on 01/12/2010   Method used:   [facility]        [facility]  [address] [phone]* (retail)       [address]       [place], [place]  [address]       Ph: [phone] or [phone]       Fax: [phone]   RxID:   [id number]

## 2010-12-07 NOTE — Progress Notes (Signed)
Summary: metformin refill   Phone Note Refill Request Message from:  Patient on January 04, 2010 4:34 PM  Refills Requested: Medication #1:  METFORMIN HCL 1000 MG  TABS TAKE 1 TAB TWICE A DAY WITH MEALS Initial call taken by: Doristine Devoid,  January 04, 2010 4:34 PM    Prescriptions: METFORMIN HCL 1000 MG  TABS (METFORMIN HCL) TAKE 1 TAB TWICE A DAY WITH MEALS  #180 x 1   Entered by:   Doristine Devoid   Authorized by:   Marga Melnick MD   Signed by:   Doristine Devoid on 01/04/2010   Method used:   [facility]        [facility]  [address] [phone]* (retail)       [address]       [place], [place]  [address]       Ph: [phone] or [phone]       Fax: [phone]   RxID:   [id number]

## 2010-12-07 NOTE — Progress Notes (Signed)
Summary: Schedule Endoscopy  Phone Note Outgoing Call Call back at [phone]   Call placed by: Harlow Mares CMA Duncan Dull),  November 19, 2009 10:49 AM Call placed to: Patient Summary of Call: called the patient on his ceel and he will call back to schedule a direct EGD  tomorrow when he has his schedule he is on insulin but can be a direct since the previsit nurses has standing insulin orders.  Initial call taken by: Harlow Mares CMA Duncan Dull),  November 19, 2009 10:54 AM  Follow-up for Phone Call        patient schedule egd note to use standing orders for insulin. Follow-up by: Harlow Mares CMA (AAMA),  November 20, 2009 11:10 AM

## 2010-12-07 NOTE — Procedures (Signed)
Summary: Upper Endoscopy  Patient: Karl Lawson Note: All result statuses are Final unless otherwise noted.  Tests: (1) Upper Endoscopy (EGD)   EGD Upper Endoscopy       DONE     [facility]     [address]     [place], [place]  [address]           ENDOSCOPY PROCEDURE REPORT           PATIENT:  Karl Lawson, Karl Lawson  MR#:  [id number]     BIRTHDATE:  [birth year], 63 yrs. old  GENDER:  male           ENDOSCOPIST:  Iva Boop, MD, [facility]           PROCEDURE DATE:  12/14/2009     PROCEDURE:  EGD with biopsy     ASA CLASS:  Class II     INDICATIONS:  GERD, Screening for Barrett's esopahgus in a GERD     patient. 11/2008 reflux esophagitis and stricture, now asymptomatic     for assessment of healing and to look for possible underlying     Barrett's esophagus           MEDICATIONS:   Fentanyl 50 mcg IV, Versed 6 mg IV     TOPICAL ANESTHETIC:  Exactacain Spray           DESCRIPTION OF PROCEDURE:   After the risks benefits and     alternatives of the procedure were thoroughly explained, informed     consent was obtained.  The LB GIF-H180 [id number] endoscope was     introduced through the mouth and advanced to the second portion of     the duodenum, without limitations.  The instrument was slowly     withdrawn as the mucosa was fully examined.     <<PROCEDUREIMAGES>>           There were columnar mucosal changes consistent with Barrett's     esophagus identified. in the distal esophagus. 39-40 cm, 1 tongue     seen Multiple biopsies were obtained and sent to pathology.  A     hiatal hernia was found. It was 2 cm in size. 40-42 cm, sliding.     Otherwise the examination was normal.    Retroflexed views revealed     a hiatal hernia.    The scope was then withdrawn from the patient     and the procedure completed.           COMPLICATIONS:  None           ENDOSCOPIC IMPRESSION:     1) Columnar mucosa, ? Barrett's in the distal esophagus (1cm     tongue - biopsied)     2) 2 cm  hiatal hernia     3) Otherwise normal examination           RECOMMENDATIONS:  Continue omeprazole 40 mg daily for GERD     Barrett's esophagus handout given today           REPEAT EXAM:  In for EGD, pending biopsy results.           Iva Boop, MD, [facility]           CC:  The Patient           n.     eSIGNED:   Iva Boop at 12/14/2009 02:01 PM           Loma Messing, [phone]  Note: An exclamation mark (!) indicates a result that was not dispersed into the flowsheet. Document Creation Date: 12/14/2009 2:01 PM _______________________________________________________________________  (1) Order result status: Final Collection or observation date-time: 12/14/2009 13:52 Requested date-time:  Receipt date-time:  Reported date-time:  Referring Physician:   Ordering Physician: Stan Head [id number]) Specimen Source:  Source: Launa Grill Order Number: [id number] Lab site:   Appended Document: Upper Endoscopy     Procedures Next Due Date:    EGD: 12/2010

## 2010-12-07 NOTE — Letter (Signed)
Summary: EGD Instructions  [facility]  [address] [place], [place] [address]   Phone: [phone]  Fax: [phone]       Karl Lawson    1946/09/19    MRN: [id number]       Procedure Day /Date:  Monday 02/07     Arrival Time:  12:30pm     Procedure Time: 1:30pm     Location of Procedure:                    _ X _ [facility] (4th Floor)    PREPARATION FOR ENDOSCOPY   On  Monday 02/07  THE DAY OF THE PROCEDURE:  1.   No solid foods, milk or milk products are allowed after midnight the night before your procedure.  2.   Do not drink anything colored red or purple.  Avoid juices with pulp.  No orange juice.  3.  You may drink clear liquids until 11:30pm  which is 2 hours before your procedure.                                                                                                CLEAR LIQUIDS INCLUDE: Water Jello Ice Popsicles Tea (sugar ok, no milk/cream) Powdered fruit flavored drinks Coffee (sugar ok, no milk/cream) Gatorade Juice: apple, white grape, white cranberry  Lemonade Clear bullion, consomm, broth Carbonated beverages (any kind) Strained chicken noodle soup Hard Candy   MEDICATION INSTRUCTIONS  Unless otherwise instructed, you should take regular prescription medications with a small sip of water as early as possible the morning of your procedure.  Diabetic patients - see separate instructions.                 OTHER INSTRUCTIONS  You will need a responsible adult at least 66 years of age to accompany you and drive you home.   This person must remain in the waiting room during your procedure.  Wear loose fitting clothing that is easily removed.  Leave jewelry and other valuables at home.  However, you may wish to bring a book to read or an iPod/MP3 player to listen to music as you wait for your procedure to start.  Remove all body piercing jewelry and leave at home.  Total time from sign-in until  discharge is approximately 2-3 hours.  You should go home directly after your procedure and rest.  You can resume normal activities the day after your procedure.  The day of your procedure you should not:   Drive   Make legal decisions   Operate machinery   Drink alcohol   Return to work  You will receive specific instructions about eating, activities and medications before you leave.    The above instructions have been reviewed and explained to me by   Ezra Sites RN  December 02, 2009 8:18 AM     I fully understand and can verbalize these instructions _____________________________ Date _________

## 2010-12-07 NOTE — Progress Notes (Signed)
Summary: 90-day RX  Phone Note From Pharmacy   Caller: [place]  [phone] Call For: Dr. Leone Payor  Summary of Call: Refill for Omeprazole---Ins. will only cover a 90 day supply Initial call taken by: Karna Christmas,  February 10, 2010 10:03 AM  Follow-up for Phone Call        updated rx sent to pharmacy.  Pharmacist notified via phone new rx sent. Follow-up by: Francee Piccolo CMA Duncan Dull),  February 10, 2010 1:59 PM    New/Updated Medications: PRILOSEC 40 MG  CPDR (OMEPRAZOLE) 1 each day 30 minutes before breakfast Prescriptions: PRILOSEC 40 MG  CPDR (OMEPRAZOLE) 1 each day 30 minutes before breakfast  #90 x 3   Entered by:   Francee Piccolo CMA (AAMA)   Authorized by:   Iva Boop MD, [facility]   Signed by:   Francee Piccolo CMA (AAMA) on 02/10/2010   Method used:   [facility]        [facility]  [address] [phone]* (retail)       [address]       [place], [place]  [address]       Ph: [phone] or [phone]       Fax: [phone]   RxID:   [id number]

## 2010-12-07 NOTE — Progress Notes (Signed)
Summary: Refill  Phone Note Call from Patient   Caller: Spouse Summary of Call: Pts wife called and scheduled cpx with fasting labs on 10/14/10 @ 10:45. She is requesting his RX (Vytorin) be filled now that he has an appt. Initial call taken by: Lavell Islam,  August 19, 2010 9:30 AM    Prescriptions: VYTORIN 10-20 MG TABS (EZETIMIBE-SIMVASTATIN) Take as directed **Labs Due**  #90 x 0   Entered by:   Shonna Chock CMA   Authorized by:   Marga Melnick MD   Signed by:   Shonna Chock CMA on 08/19/2010   Method used:   [facility]        [facility]  [address] [phone]* (retail)       [address]       [place], [place]  [address]       Ph: [phone] or [phone]       Fax: [phone]   RxID:   [id number]

## 2010-12-07 NOTE — Letter (Signed)
Summary: Previsit letter  [facility]  [address] [place], [place] [address]   Phone: [phone]  Fax: [phone]       11/20/2009 MRN: [id number]  [facility] [address] [place], [place]  [address]  Dear Mr. [facility],  Welcome to the Gastroenterology Division at [facility].    You are scheduled to see a nurse for your pre-procedure visit on 12-02-09 at 8:00a.m. on the 3rd floor at [facility], [address]  We ask that you try to arrive at our office 15 minutes prior to your appointment time to allow for check-in.  Your nurse visit will consist of discussing your medical and surgical history, your immediate family medical history, and your medications.    Please bring a complete list of all your medications or, if you prefer, bring the medication bottles and we will list them.  We will need to be aware of both prescribed and over the counter drugs.  We will need to know exact dosage information as well.  If you are on blood thinners (Coumadin, Plavix, Aggrenox, Ticlid, etc.) please call our office today/prior to your appointment, as we need to consult with your physician about holding your medication.   Please be prepared to read and sign documents such as consent forms, a financial agreement, and acknowledgement forms.  If necessary, and with your consent, a friend or relative is welcome to sit-in on the nurse visit with you.  Please bring your insurance card so that we may make a copy of it.  If your insurance requires a referral to see a specialist, please bring your referral form from your primary care physician.  No co-pay is required for this nurse visit.     If you cannot keep your appointment, please call [phone] to cancel or reschedule prior to your appointment date.  This allows [place] the opportunity to schedule an appointment for another patient in need of care.    Thank you for choosing [facility] for your medical needs.  We  appreciate the opportunity to care for you.  Please visit [place] at our website  to learn more about our practice.                     Sincerely.                                                                                                                   The Gastroenterology Division

## 2010-12-07 NOTE — Letter (Signed)
Summary: Patient Notice-Barrett's [facility]  [address] [place], [place] [address]   Phone: [phone]  Fax: [phone]        December 18, 2009 MRN: [id number]    [facility] [address] [place], [place]  [address]    Dear Mr. VOTAW,  I am pleased to inform you that the biopsies taken during your recent endoscopic examination did not show any evidence of cancer upon pathologic examination.  However, your biopsies indicate you have a condition known as Barrett's esophagus. While not cancer, it is pre-cancerous (can progress to cancer) and needs to be monitored with repeat endoscopic examination and biopsies.  Fortunately, it is quite rare that this develops into cancer, but careful monitoring of the condition along with taking your medication as prescribed is important in reducing the risk of developing cancer.  Avoiding tobacco, losing weight and minimizing or eliminating caffeine use also reduce the risk of problems with GERD, Barrett's and your overall health.    It is my recommendation that you have a repeat upper gastrointestinal endoscopic examination in 1 year.  Please call us if you have or develop heartburn, reflux symptoms, any swallowing problems, or if you have questions about your condition that have not been fully answered at this time.  Sincerely,  Iva Boop MD, [facility]  This letter has been electronically signed by your physician.  Appended Document: Patient Notice-Barrett's Esopghagus Letter mailed 2.15.11

## 2010-12-07 NOTE — Letter (Signed)
Summary: [facility]  [facility]   Imported By: Lanelle Bal 08/28/2010 10:46:59  _____________________________________________________________________  External Attachment:    Type:   Image     Comment:   External Document

## 2010-12-07 NOTE — Letter (Signed)
Summary: [facility]  [facility]   Imported By: Lanelle Bal 02/09/2010 08:05:03  _____________________________________________________________________  External Attachment:    Type:   Image     Comment:   External Document

## 2010-12-07 NOTE — Miscellaneous (Signed)
Summary: [facility]  Clinical Lists Changes  Observations: Added new observation of NKA: T (12/02/2009 7:51)

## 2010-12-09 NOTE — Assessment & Plan Note (Signed)
Summary: Cardiology Nuclear Testing  Nuclear Med Background Indications for Stress Test: Evaluation for Ischemia, Abnormal EKG     Symptoms: Chest Pain, Dizziness    Nuclear Pre-Procedure Cardiac Risk Factors: Hypertension, IDDM Type 2, Lipids, RBBB, Smoker Caffeine/Decaff Intake: none NPO After: 8:00 PM Lungs: Clear IV 0.9% NS with Angio Cath: 20g     IV Site: R Antecubital IV Started by: Stanton Kidney, EMT-P Chest Size (in) 46     Height (in): 69 Weight (lb): 234 BMI: 34.68  Nuclear Med Study 1 or 2 day study:  1 day     Stress Test Type:  Stress Reading MD:  Olga Millers, MD     Referring MD:  Marca Ancona Resting Radionuclide:  Technetium 110m Tetrofosmin     Resting Radionuclide Dose:  11.0 mCi  Stress Radionuclide:  Technetium 43m Tetrofosmin     Stress Radionuclide Dose:  33.0 mCi   Stress Protocol Exercise Time (min):  4:30 min     Max HR:  148 bpm     Predicted Max HR:  155 bpm  Max Systolic BP: 171 mm Hg     Percent Max HR:  95.48 %     METS: 6.4 Rate Pressure Product:  04540    Stress Test Technologist:  Irean Hong,  RN     Nuclear Technologist:  Doyne Keel, CNMT  Rest Procedure  Myocardial perfusion imaging was performed at rest 45 minutes following the intravenous administration of Technetium 28m Tetrofosmin.  Stress Procedure  The patient exercised for 4 minutes and 30 seconds, RPE=15. The patient stopped due to DOE    and denied any chest pain.  The EKG was nondiagnostic due to baseline changes. Technetium 59m Tetrofosmin was injected at peak exercise and myocardial perfusion imaging was performed after a brief delay.  QPS Raw Data Images:  Acquisition technically good; normal left ventricular size. Stress Images:  There is decreased uptake in the inferior wall Rest Images:  There is decreased uptake in the inferior wall, slightly less prominent compared to the stress images. Subtraction (SDS):  These findings are consistent with inferior thinning and  mild inferior ischemia. Transient Ischemic Dilatation:  .92  (Normal <1.22)  Lung/Heart Ratio:  .32  (Normal <0.45)  Quantitative Gated Spect Images QGS EDV:  77 ml QGS ESV:  23 ml QGS EF:  70 % QGS cine images:  Normal wall motion.   Overall Impression  Exercise Capacity: Poor exercise capacity. BP Response: Normal blood pressure response. Clinical Symptoms: There is dyspnea. ECG Impression: No significant ST segment change suggestive of ischemia. Overall Impression: Abnormal stress nuclear study with inferior thinning and mild inferior ischemia.  Appended Document: Cardiology Nuclear Testing possible mild inferior ischemia.  Will need to discuss in-depth at followup.   Appended Document: Cardiology Nuclear Testing adv pt of results. will schedule 6mos f/u Claris Gladden, RN, BSN    Appended Document: Cardiology Nuclear Testing per Dr Malachi Paradise to schedule OV in next 1-2 weeks--LMTCB   Appended Document: Cardiology Nuclear Testing I discussed with pt--appt made with Dr Shirlee Latch for 11/18/10

## 2010-12-09 NOTE — Assessment & Plan Note (Signed)
Summary: rov   Visit Type:  Follow-up Primary Provider:  Marga Melnick, MD  CC:  no complaints.  History of Present Illness: 65 yo with history of diabetes, HTN, hyperlipidemia, and smoking returns for cardiology followup.  Patient was recently noted to have RBBB with LAFB on ECG.  He has not had any severe cardiopulmonary symptoms.  He does report some sharp left lateral chest pain that is nonexertional and lasts for < 30 seconds at a time.  He gets somewhat short of breath walking up steps and noted some shortness of breath when he had to walk about 1/2 mile last Sunday from the parking lot to the [facility] for the [facility] basketball game. He is trying to cut back on his smoking.  He has no history of syncope.  He is lightheaded only if he bends over for a prolonged period then stands up quickly.   I had him do an ETT-myoview, which showed below average exercise tolerance and a partially reversible mild inferior defect that may have been due to ischemia versus diaphragmatic attenuation.  Echo showed preserved systolic function with mild LV hypertrophy.   ECG: NSR, LAFB, RBBB  Labs (12/11): K 3.5, creatinine 1.3, LDL 56, HDL 31, LFTs normal, TSH normal   Current Medications (verified): 1)  Novolog Mix 70/30 70-30 % Susp (Insulin Aspart Prot & Aspart) .... 40 Units Am 30 Units Pm 2)  Metformin Hcl 1000 Mg  Tabs (Metformin Hcl) .Marland Kitchen.. 1 By Mouth Two Times A Day With Meals 3)  Freestyle Test   Strp (Glucose Blood) .... Test Twice Daily 4)  Ramipril 10 Mg  Caps (Ramipril) .... Take One Capsule Daily 5)  Adult Aspirin Low Strength 81 Mg Tbdp (Aspirin) .Marland Kitchen.. 1 By Mouth Once Daily 6)  Multivitamins   Tabs (Multiple Vitamin) .Marland Kitchen.. 1 By Mouth Once Daily 7)  Fish Oil 1000 Mg Caps (Omega-3 Fatty Acids) .Marland Kitchen.. 1 By Mouth Once Daily 8)  Vytorin 10-20 Mg Tabs (Ezetimibe-Simvastatin) .... Take As Directed 9)  Prilosec 40 Mg  Cpdr (Omeprazole) .Marland Kitchen.. 1 Each Day 30 Minutes Before Breakfast 10)  Losartan  Potassium-Hctz 100-25 Mg Tabs (Losartan Potassium-Hctz) .Marland Kitchen.. 1 By Mouth Once Daily 11)  Finasteride 5 Mg Tabs (Finasteride) .Marland Kitchen.. 1 Tablet By Mouth Once Daily 12)  Rapaflo 8 Mg Caps (Silodosin) .Marland Kitchen.. 1 Capsule By Mouth Once Daily 13)  Doxycycline Hyclate 100 Mg Caps (Doxycycline Hyclate) .Marland Kitchen.. 1 Cap Two Times A Day Until Gone, 10-24-10  Allergies (verified): No Known Drug Allergies  Past History:  Past Medical History: 1. Elevated PSA ( max 9.5), Dr Retta Diones.  Biopsies negative for cancer. 2. Diabetes mellitus, type II, Dr Talmage Nap 3. Hyperlipidemia 4. Hypertension 5. Colonic polyps, PMH  of 2004,2010, Dr Leone Payor 6. Diverticulosis, colon 2004,2010 7. GERD with erosive esophagitis & gastritis @ Endo 2010 8. RBBB with LAFB 9. ETT-myoview (12/11): 4'30", stopped due to dyspnea, nonspecific ECG changes, EF 70%, mild partially reversible inferior defect (ischemia versus diaphragmatic attenuation) 10.  Echo (12/11) : EF 55-60%, mild LV hypertrophy 11.  Obesity  Family History: Reviewed history from 10/14/2010 and no changes required. Father: CVA in  late 60s,CHF Mother: Alzheimer's Siblings: bro: DM ,  PTE  post  gastric bypass; sister :DDD  Social History: Reviewed history from 10/14/2010 and no changes required. Married with 2 children (boys) Occupation: Systems developer Current Smoker: 10 cigars / week Alcohol use-yes  Review of Systems       All systems reviewed and negative except as  per HPI.   Vital Signs:  Patient profile:   65 year old male Height:      69 inches Weight:      240 pounds BMI:     35.57 Pulse rate:   80 / minute BP sitting:   128 / 70  (left arm) Cuff size:   regular  Vitals Entered By: Caralee Ates CMA (November 18, 2010 10:04 AM)  Physical Exam  General:  Well developed, well nourished, in no acute distress. Obese.  Neck:  Neck thick, no JVD. No masses, thyromegaly or abnormal cervical nodes. Lungs:  Clear bilaterally to auscultation and  percussion. Heart:  Non-displaced PMI, chest non-tender; regular rate and rhythm, S1, S2 without murmurs, rubs. +S4. Carotid upstroke normal, no bruit.  Pedals normal pulses. No edema, no varicosities. Abdomen:  Bowel sounds positive; abdomen soft and non-tender without masses, organomegaly, or hernias noted. No hepatosplenomegaly. Extremities:  No clubbing or cyanosis. Neurologic:  Alert and oriented x 3. Psych:  Normal affect.   Impression & Recommendations:  Problem # 1:  SHORTNESS OF BREATH (ICD-786.05) Some exertional dyspnea with no significant chest pain.  ECG with RBBB and LAFB.  Patient has a number of risk factors for CAD: HTN, hyperlipidemia, smoking, diabetes.  He had a myoview showing a mild partially reversible inferior defect.  This is overall low risk and may be diaphragmatic attenuation.  However, given his risk factors, this certainly may indicate coronary disease.  I had a long discussion with him today about how to proceed: cath versus medical management.  Given the overall low risk study, he opted for medical management for now.  If he develops progressive dyspnea or any significant chest pain, I would plan on a left heart cath. - Continue ASA, statin, ACEI.  - Needs to quit smoking - Needs to lose weight  Problem # 2:  HYPERTENSION (ICD-401.9) Blood pressure is currently under good control.   Problem # 3:  HYPERLIPIDEMIA (ICD-272.4) LDL < 70 already, which is at goal.   Patient Instructions: 1)  Your physician wants you to follow-up in:  6 months. You will receive a reminder letter in the mail two months in advance. If you don't receive a letter, please call our office to schedule the follow-up appointment. 2)  You will need to return for FASTING labwork just prior to your follwoup appointment: lipid/liver.  3)  Your physician recommends that you continue on your current medications as directed. Please refer to the Current Medication list given to you today.

## 2010-12-09 NOTE — Assessment & Plan Note (Signed)
Summary: np6/heart block-mb   Visit Type:  np Primary Vernice Bowker:  Marga Melnick, MD  CC:  dizziness-Occ.  History of Present Illness: 65 yo with history of diabetes, HTN, hyperlipidemia, and smoking presents for evaluation of abnormal ECG.  Patient was recently noted to have RBBB with LAFB on ECG.  He has not had any severe cardiopulmonary symptoms.  He does report some sharp left lateral chest pain that is nonexertional and lasts for < 30 seconds at a time.  He denies exertional dyspnea.  No palpitations.  He occasionally gets mildly lightheaded but only after standing quickly.  No sycnope.  He has some left shoulder pain that he attributes to rotator cuff problems.  His blood glucose is not ideally controlled.  It has been running in the 200s most evenings.    ECG: NSR, LAFB, RBBB  Labs (12/11): K 3.5, creatinine 1.3, LDL 56, HDL 31, LFTs normal, TSH normal  Current Medications (verified): 1)  Novolog Mix 70/30 70-30 % Susp (Insulin Aspart Prot & Aspart) .... 40 Units Am 30 Units Pm 2)  Metformin Hcl 1000 Mg  Tabs (Metformin Hcl) .Marland Kitchen.. 1 By Mouth Two Times A Day With Meals 3)  Freestyle Test   Strp (Glucose Blood) .... Test Twice Daily 4)  Ramipril 10 Mg  Caps (Ramipril) .... Take One Capsule Daily 5)  Adult Aspirin Low Strength 81 Mg Tbdp (Aspirin) .Marland Kitchen.. 1 By Mouth Once Daily 6)  Multivitamins   Tabs (Multiple Vitamin) .Marland Kitchen.. 1 By Mouth Once Daily 7)  Fish Oil 1000 Mg Caps (Omega-3 Fatty Acids) .Marland Kitchen.. 1 By Mouth Once Daily 8)  Vytorin 10-20 Mg Tabs (Ezetimibe-Simvastatin) .... Take As Directed 9)  Prilosec 40 Mg  Cpdr (Omeprazole) .Marland Kitchen.. 1 Each Day 30 Minutes Before Breakfast 10)  Losartan Potassium-Hctz 100-25 Mg Tabs (Losartan Potassium-Hctz) .Marland Kitchen.. 1 By Mouth Once Daily 11)  Finasteride 5 Mg Tabs (Finasteride) .Marland Kitchen.. 1 Tablet By Mouth Once Daily 12)  Rapaflo 8 Mg Caps (Silodosin) .Marland Kitchen.. 1 Capsule By Mouth Once Daily 13)  Doxycycline Hyclate 100 Mg Caps (Doxycycline Hyclate) .Marland Kitchen.. 1 Cap Two Times A  Day Until Gone, 10-24-10  Allergies (verified): No Known Drug Allergies  Past History:  Past Medical History: 1. Elevated PSA ( max 9.5), Dr Retta Diones.  Biopsies negative for cancer. 2. Diabetes mellitus, type II, Dr Talmage Nap 3. Hyperlipidemia 4. Hypertension 5. Colonic polyps, PMH  of 2004,2010, Dr Leone Payor 6. Diverticulosis, colon 2004,2010 7. GERD with erosive esophagitis & gastritis @ Endo 2010 8. RBBB with LAFB  Family History: Reviewed history from 10/14/2010 and no changes required. Father: CVA in  late 60s,CHF Mother: Alzheimer's Siblings: bro: DM ,  PTE  post  gastric bypass; sister :DDD  Social History: Reviewed history from 10/14/2010 and no changes required. 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   Vital Signs:  Patient profile:   65 year old male Height:      69.25 inches Weight:      235.50 pounds BMI:     34.65 Pulse rate:   95 / minute BP sitting:   116 / 70  (left arm) Cuff size:   regular  Vitals Entered By: Caralee Ates CMA (October 22, 2010 2:43 PM)  Physical Exam  General:  Well developed, well nourished, in no acute distress. Obese.  Head:  normocephalic and atraumatic Nose:  no deformity, discharge, inflammation, or lesions Mouth:  Teeth, gums and palate normal. Oral mucosa normal. Neck:  Neck supple, no JVD. No masses, thyromegaly or abnormal cervical nodes. Lungs:  Clear bilaterally to auscultation and percussion. Heart:  Non-displaced PMI, chest non-tender; regular rate and rhythm, S1, S2 without murmurs, rubs. +S4. Carotid upstroke normal, no bruit.  Pedals normal pulses. No edema, no varicosities. Abdomen:  Bowel sounds positive; abdomen soft and non-tender without masses, organomegaly, or hernias noted. No hepatosplenomegaly. Extremities:  No clubbing or cyanosis. Neurologic:   Alert and oriented x 3. Skin:  Intact without lesions or rashes. Psych:  Normal affect.   Impression & Recommendations:  Problem # 1:  ABNORMAL ELECTROCARDIOGRAM (ICD-794.31) Patient has RBBB with LAFB.  This can be due to coronary disease, but also could be secondary to non-CAD related conduction system degeneration.  Patient has had no symptoms of bradycardia such as spontaneous lightheadedness or syncope.  He does have many risk factors for coronary disease, including hyperlipidemia, HTN, diabetes, smoking, obesity, age and gender.  He has some atypical chest pain.  I will arrange for an ETT-myoview for risk stratification.  I will get an echo to assess for structural cardiac disease.    Problem # 2:  HYPERTENSION (ICD-401.9) BP is under good control.   Problem # 3:  HYPERLIPIDEMIA (ICD-272.4) LDL < 70, which is what I would aim for in this patient.   Other Orders: Echocardiogram (Echo) Nuclear Stress Test (Nuc Stress Test)  Patient Instructions: 1)  Your physician has requested that you have an exercise stress myoview.  For further information please visit  2)  [email or web address]  Please follow instruction sheet, as given. 3)  Your physician has requested that you have an echocardiogram.  Echocardiography is a painless test that uses sound waves to create images of your heart. It provides your doctor with information about the size and shape of your heart and how well your heart's chambers and valves are working.  This procedure takes approximately one hour. There are no restrictions for this procedure. 4)  Your physician recommends that you schedule a follow-up appointment as needed with Dr Shirlee Latch.

## 2010-12-09 NOTE — Progress Notes (Signed)
Summary: resturning your call  Phone Note Call from Patient Call back at Home Phone [phone]   Caller: Patient Reason for Call: Talk to Nurse Summary of Call: pt returning anne call Initial call taken by: Roe Coombs,  November 11, 2010 8:59 AM  Follow-up for Phone Call        I discussed myoview results with pt--appt made with Dr Shirlee Latch for 11/18/10 to discuss results

## 2010-12-09 NOTE — Progress Notes (Signed)
Summary: Nuclear Pre-Procedure  Phone Note Outgoing Call Call back at Home Phone [phone]   Action Taken: Phone Call Completed Summary of Call: Reviewed information on Myoview Information Sheet (see scanned document for further details).  Spoke with Patient's wife, Glena Norfolk.    Nuclear Med Background Indications for Stress Test: Evaluation for Ischemia, Abnormal EKG     Symptoms: Chest Pain, Dizziness    Nuclear Pre-Procedure Cardiac Risk Factors: IDDM Type 2, Lipids, RBBB, Smoker Height (in): 69.25

## 2010-12-09 NOTE — Letter (Signed)
Summary: [facility]  [facility]   Imported By: Lanelle Bal 11/03/2010 10:47:43  _____________________________________________________________________  External Attachment:    Type:   Image     Comment:   External Document

## 2010-12-20 ENCOUNTER — Encounter: Payer: Self-pay | Admitting: Internal Medicine

## 2010-12-29 NOTE — Letter (Signed)
Summary: Endoscopy Letter  [facility]  [address]   [place], [place] [address]   Phone: [phone]  Fax: [phone]      December 20, 2010 MRN: [id number]   [facility] [address] [place], [place]  [address]   Dear Mr. MIERZWA,   According to your medical record, it is time for you to schedule an Endoscopy. Endoscopic screening is recommended for patients with certain upper digestive tract conditions because of associated increased risk for cancers of the upper digestive system.  This letter has been generated based on the recommendations made at the time of your prior procedure. If you feel that in your particular situation this may no longer apply, please contact our office.  Please call our office at [phone]) to schedule this appointment or to update your records at your earliest convenience.  Thank you for cooperating with [place] to provide you with the very best care possible.   Sincerely,   Stan Head, M.D.  [facility] [phone]

## 2011-01-17 LAB — URINE MICROSCOPIC-ADD ON

## 2011-01-17 LAB — URINALYSIS, ROUTINE W REFLEX MICROSCOPIC
Bilirubin Urine: NEGATIVE
Glucose, UA: >1000 mg/dL — AB
Ketones, ur: NEGATIVE mg/dL
Protein, ur: NEGATIVE mg/dL
pH: 6.5 (ref 5.0–8.0)

## 2011-01-17 LAB — GLUCOSE, CAPILLARY: Glucose-Capillary: 204 mg/dL — ABNORMAL HIGH (ref 70–99)

## 2011-01-26 LAB — GLUCOSE, CAPILLARY
Glucose-Capillary: 159 mg/dL — ABNORMAL HIGH (ref 70–99)
Glucose-Capillary: 72 mg/dL (ref 70–99)

## 2011-02-21 LAB — GLUCOSE, CAPILLARY: Glucose-Capillary: 163 mg/dL — ABNORMAL HIGH (ref 70–99)

## 2011-03-03 ENCOUNTER — Other Ambulatory Visit: Payer: Self-pay | Admitting: Internal Medicine

## 2011-03-03 NOTE — Telephone Encounter (Signed)
Needs to have Upper Endoscopy.

## 2011-03-25 NOTE — Op Note (Signed)
NAME:  Karl Lawson, Karl Lawson                           ACCOUNT NO.:  [id number]   MEDICAL RECORD NO.:  [id number]                   PATIENT TYPE:  AMB   LOCATION:  DAY                                  FACILITY:  [facility]   PHYSICIAN:  Sharlet Salina T. Hoxworth, M.D.          DATE OF BIRTH:  1946/02/16   DATE OF PROCEDURE:  09/03/2003  DATE OF DISCHARGE:                                 OPERATIVE REPORT   PREOPERATIVE DIAGNOSIS:  Incarcerated sliding left inguinal hernia.   POSTOPERATIVE DIAGNOSIS:  Incarcerated sliding left inguinal hernia.   PROCEDURE:  Repair of incarcerated sliding left inguinal hernia.   SURGEON:  Lorne Skeens. Hoxworth, M.D.   ANESTHESIA:  General.   BRIEF HISTORY:  Mr. Trumbull is a 65 year old white male with a long history of  gradually-enlarging mass in his left groin and scrotum.  Examination reveals  a large, chronically incarcerated left scrotal hernia.  Repair under general  anesthesia has been recommended and accepted.  The nature of the procedure,  indications, risks of bleeding, infection, and recurrence, were discussed  and understood preoperatively.  He is now brought to the operating room for  this procedure.   DESCRIPTION OF OPERATION:  The patient was brought to the operating room and  placed in the supine position on the operating table and general anesthesia  was induced.  He received preoperative antibiotics.  The abdomen, scrotum,  and penis were all sterilely prepped and draped.  An oblique incision was  made in the left groin and dissection carried down through subcutaneous  tissue.  There was  large hernia mass extending out through the external  ring.  The external ring and external oblique were identified and cleared  and the external oblique divided along the line of its fibers through the  external ring.  The hernia sac and contents were very large and to help with  the reduction, I dissected all this bluntly out of the scrotum, including  the  testicle and cord.  At this point the sac was exposed medially and  opened.  The hernia sac contained a large portion of the sigmoid colon,  approximately two feet, and most of this was in a retroperitoneal position  firmly attached to the sac in a sliding fashion and could not be reduced  into the abdomen independent of the sac.  A portion of the sigmoid colon was  free.  Due to large mass of hernia content, this was impossible to reduce  through the existing internal ring even though it was dilated.  Therefore, I  enlarged the internal ring, dividing the internal oblique back laterally  into the abdominal wall to allow the colon and hernia sac to be completely  reduced back into the abdominal cavity.  The cord and testicle were freed,  dividing cremasteric attachments, and the cord was completely freed back  into the retroperitoneum as well.  At this point I then  repaired the  internal oblique, closing with running 0 PDS, working lateral to medial  until the original anatomy of the internal ring was reconstructed.  There  was essentially no floor of the inguinal canal remaining.  A large piece of  Prolene mesh was used to cover the floor and large tails to go back up  around the cord at the internal ring and to cover the internal oblique over  where it had been repaired.  The mesh was sutured initially to the pubic  tubercle with running 2-0 Prolene and then to the iliopubic tract and  inguinal ligament, working medial to lateral up until we were well lateral  to the internal ring.  The rectus sheath was exposed very medially to allow  nice overlap of the mesh onto the rectus sheath as the floor was essentially  gone from the hernia.  The mesh was then sutured to the rectus sheath with  interrupted 2-0 Prolene.  The tails were then tacked together lateral to the  cord at the internal ring, creating a new internal ring.  This appeared to  provide nice, broad coverage of the direct and  indirect spaces.  The  cord and testicle were returned to their anatomic position.  The external  oblique was closed with running 3-0 Vicryl.  The subcu was closed with  running 3-0 Vicryl.  The skin was closed with staples.  Bertram Millard.  Dahlstedt, M.D., then proceeded with circumcision as planned.                                               Lorne Skeens. Hoxworth, M.D.    Tory Emerald  D:  09/03/2003  T:  09/03/2003  Job:  [id number]

## 2011-03-25 NOTE — Op Note (Signed)
NAME:  Karl Lawson, Karl Lawson                           ACCOUNT NO.:  [id number]   MEDICAL RECORD NO.:  [id number]                   PATIENT TYPE:  AMB   LOCATION:  DAY                                  FACILITY:  [facility]   PHYSICIAN:  Bertram Millard. Dahlstedt, M.D.          DATE OF BIRTH:  [birth year]   DATE OF PROCEDURE:  09/03/2003  DATE OF DISCHARGE:                                 OPERATIVE REPORT   PREOPERATIVE DIAGNOSES:  1. Recurrent balanitis.  2. Pathologic phimosis.   POSTOPERATIVE DIAGNOSIS:  Pathologic phimosis.   PROCEDURE:  Circumcision.   SURGEON:  Bertram Millard. Retta Diones, M.D.   ASSISTANT:  Susanne Borders, M.D.   ANESTHESIA:  General endotracheal, 0.5% Marcaine 6 mL penile block.   COMPLICATIONS:  None.   ESTIMATED BLOOD LOSS:  Minimal.   INDICATIONS FOR PROCEDURE:  [name] is a 65 year old diabetic male who has  a history of recurrent balanitis and mild phimosis. The patient had  difficulty retracting his foreskin and has had problems with yeast  infections. He requested a circumcision. He agreed to this after  understanding the risks, benefits, and alternatives. Because the patient had  a large left inguinal hernia, this is to be done in conjunction with Dr.  Johna Sheriff performing a left inguinal hernia repair. Because of the excessive  size of this hernia, it was felt that it was necessary to repair the hernia  prior to the circumcision to avoid distortion of the penile skin.   DESCRIPTION OF PROCEDURE:  The patient was already under general anesthesia  having had his left inguinal hernia repair performed by Dr. Johna Sheriff. The  foreskin was retracted manually, there were some penile adhesions that were  bluntly taken down. The glans was reprepped with Betadine. Proximal and  distal circumcising incisions were made. Care was taken in the proximal  incision to preserve a little more ventral skin. A 15 blade knife was used  to make the incisions. Metzenbaum scissors were used  to dissect under the  foreskin.   A midline incision was made dorsally through the foreskin. The four corners  of the foreskin were grasped with hemostats and Bovie electrocautery was  used to incise the dartos attachments to the foreskin. The foreskin was  passed off the table. The dartos tissue remaining on the penile shaft was  carefully inspected for bleeding and any bleeding vessels were carefully  coagulated with the Bovie. The incision was then closed in four quadrants  with  3-0 chromic, these were interrupted sutures. A U stitch was placed  ventrally through the frenulum. The remaining incision between the four  interrupted sutures was closed in a running fashion with the 4-0 chromic. A  Vaseline gauze and a Conform dressing were applied.   The patient awakened easily from his anesthesia and was taken to  Postanesthesia Care Unit in stable condition. Please note that Dr. Retta Diones  was present and participated in  all aspects of this case as he was the  primary surgeon.     Susanne Borders, MD                           Bertram Millard. Dahlstedt, M.D.    DR/MEDQ  D:  09/03/2003  T:  09/03/2003  Job:  [id number]

## 2011-06-20 ENCOUNTER — Other Ambulatory Visit: Payer: Self-pay | Admitting: Internal Medicine

## 2011-09-01 ENCOUNTER — Other Ambulatory Visit: Payer: Self-pay | Admitting: Internal Medicine

## 2011-10-09 ENCOUNTER — Other Ambulatory Visit: Payer: Self-pay | Admitting: Internal Medicine

## 2011-10-13 ENCOUNTER — Other Ambulatory Visit: Payer: Self-pay | Admitting: Internal Medicine

## 2011-10-17 ENCOUNTER — Other Ambulatory Visit: Payer: Self-pay | Admitting: Internal Medicine

## 2011-10-18 NOTE — Telephone Encounter (Signed)
Patient last seen for an office visit 10/10/2008. Last seen for EGD 12/14/2009 with a note for recall EGD in 1 year. A Letter was sent out 12/2010 to remind patient of a recall Endoscopic procedure but there is a recall Colon in the system for 11/08/2011. Do you want to refill Omeprazole or does patient need to come in to be seen. Please advise.

## 2011-10-19 NOTE — Telephone Encounter (Signed)
Talked with Mrs Mohon and told her per Dr. Leone Payor, patient needs to call and schedule a pre-visit and EGD/Colon or can call and schedule OV. I will send 2 month supply only of medication.

## 2011-10-19 NOTE — Telephone Encounter (Signed)
He can have a 2 month refill but I would lke him called and have a previsit for EGD/colonoscopy arranged He may schedule office visit if he wants to discuss these recommendations

## 2011-12-02 ENCOUNTER — Encounter: Payer: Self-pay | Admitting: Internal Medicine

## 2011-12-18 ENCOUNTER — Other Ambulatory Visit: Payer: Self-pay | Admitting: Internal Medicine

## 2011-12-22 ENCOUNTER — Emergency Department (INDEPENDENT_AMBULATORY_CARE_PROVIDER_SITE_OTHER)
Admission: EM | Admit: 2011-12-22 | Discharge: 2011-12-22 | Disposition: A | Discharge status: Home / Self Care | Payer: BC Managed Care – PPO | Source: Home / Self Care | Attending: Family Medicine | Admitting: Family Medicine

## 2011-12-22 ENCOUNTER — Encounter (HOSPITAL_COMMUNITY): Payer: Self-pay

## 2011-12-22 DIAGNOSIS — L723 Sebaceous cyst: Secondary | ICD-10-CM

## 2011-12-22 HISTORY — DX: Essential (primary) hypertension: I10

## 2011-12-22 MED ORDER — CEPHALEXIN 500 MG PO CAPS: 500.0000 mg | ORAL_CAPSULE | Freq: Four times a day (QID) | ORAL | Status: AC

## 2011-12-22 NOTE — ED Notes (Signed)
C/o painful cyst ot lt upper back for 1 week. Has been using hot compresses and taking ibuprofen.  States had similar area to rt side of back years ago.

## 2011-12-22 NOTE — Discharge Instructions (Signed)
Keep area clean and covered. It may drain for a few days. Epidermal Cyst An epidermal cyst is sometimes called a sebaceous cyst, epidermal inclusion cyst, or infundibular cyst. These cysts usually contain a substance that looks "pasty" or "cheesy" and may have a bad smell. This substance is a protein called keratin. Epidermal cysts are usually found on the face, neck, or trunk. They may also occur in the vaginal area or other parts of the genitalia of both men and women. Epidermal cysts are usually small, painless, slow-growing bumps or lumps that move freely under the skin. It is important not to try to pop them. This may cause an infection and lead to tenderness and swelling. CAUSES  Epidermal cysts may be caused by a deep penetrating injury to the skin or a plugged hair follicle, often associated with acne. SYMPTOMS  Epidermal cysts can become inflamed and cause:  Redness.   Tenderness.   Increased temperature of the skin over the bumps or lumps.   Grayish-white, bad smelling material that drains from the bump or lump.  DIAGNOSIS  Epidermal cysts are easily diagnosed by your caregiver during an exam. Rarely, a tissue sample (biopsy) may be taken to rule out other conditions that may resemble epidermal cysts. TREATMENT   Epidermal cysts often get better and disappear on their own. They are rarely ever cancerous.   If a cyst becomes infected, it may become inflamed and tender. This may require opening and draining the cyst. Treatment with antibiotics may be necessary. When the infection is gone, the cyst may be removed with minor surgery.   Small, inflamed cysts can often be treated with antibiotics or by injecting steroid medicines.   Sometimes, epidermal cysts become large and bothersome. If this happens, surgical removal in your caregiver's office may be necessary.  HOME CARE INSTRUCTIONS  Only take over-the-counter or prescription medicines as directed by your caregiver.   Take your  antibiotics as directed. Finish them even if you start to feel better.  SEEK MEDICAL CARE IF:   Your cyst becomes tender, red, or swollen.   Your condition is not improving or is getting worse.   You have any other questions or concerns.  MAKE SURE YOU:  Understand these instructions.   Will watch your condition.   Will get help right away if you are not doing well or get worse.  Document Released: 09/24/2004 Document Revised: 07/06/2011 Document Reviewed: 05/02/2011 [facility] Patient Information 2012 [place], [place].

## 2011-12-22 NOTE — ED Provider Notes (Signed)
History     CSN: [id number]  Arrival date & time 12/22/11  0920   First MD Initiated Contact with Patient 12/22/11 1002      Chief Complaint  Patient presents with  . Cyst    (Consider location/radiation/quality/duration/timing/severity/associated sxs/prior treatment) HPI Comments: Patient reports having an abscess on his back for quite sometime. States it enlarged over the past wk and became more painful. His wife tried to drain with a needle. Hx of similar episode in past on different side of back.   The history is provided by the patient.    Past Medical History  Diagnosis Date  . Diabetes mellitus   . Hypertension     Past Surgical History  Procedure Date  . Appendectomy   . Hernia repair     History reviewed. No pertinent family history.  History  Substance Use Topics  . Smoking status: Current Everyday Smoker    Types: Cigars  . Smokeless tobacco: Not on file  . Alcohol Use: Yes      Review of Systems  Constitutional: Negative.   HENT: Negative.   Respiratory: Negative.   Cardiovascular: Negative.   Gastrointestinal: Negative.   Genitourinary: Negative.     Allergies  Review of patient's allergies indicates no known allergies.  Home Medications   Current Outpatient Rx  Name Route Sig Dispense Refill  . AMLODIPINE BESYLATE 5 MG PO TABS Oral Take 5 mg by mouth daily.    Marland Kitchen EZETIMIBE-SIMVASTATIN 10-20 MG PO TABS Oral Take 1 tablet by mouth at bedtime.    Marland Kitchen FINASTERIDE 5 MG PO TABS Oral Take 5 mg by mouth daily.    Marland Kitchen NOVOLOG [place] Subcutaneous Inject into the skin 2 (two) times daily.    Marland Kitchen LOSARTAN POTASSIUM-HCTZ 100-25 MG PO TABS Oral Take 1 tablet by mouth daily.    Marland Kitchen METFORMIN HCL 1000 MG PO TABS Oral Take 1,000 mg by mouth 2 (two) times daily with a meal.    . OMEPRAZOLE 40 MG PO CPDR  TAKE 1 CAPSULE EACH DAY 30 MINUTES BEFORE BREAKFAST 30 capsule 1  . CEPHALEXIN 500 MG PO CAPS Oral Take 1 capsule (500 mg total) by mouth 4 (four) times daily. 20  capsule 0    BP 119/74  Pulse 88  Temp(Src) 97.9 F (36.6 C) (Oral)  Resp 16  SpO2 94%  Physical Exam  Nursing note and vitals reviewed. Constitutional: He appears well-developed and well-nourished. No distress.  Cardiovascular: Normal rate and regular rhythm.   Pulmonary/Chest: Effort normal and breath sounds normal.  Skin:       Inflamed sebaceous cyst left upper back.     ED Course  Procedures (including critical care time) after discussion with the patient he state he wants it drained. area was prepped with betadine. local with 2% xylocain . 2 cm incision with 11 blade. large amt of foul smelling cheesey material expressed. good hemostasis . patient tollerated well. steril dressing applied. Labs Reviewed - No data to display No results found.   1. Sebaceous cyst       MDM          Randa Spike, MD 12/22/11 1053

## 2011-12-23 ENCOUNTER — Other Ambulatory Visit: Payer: Self-pay | Admitting: Internal Medicine

## 2011-12-26 ENCOUNTER — Other Ambulatory Visit: Payer: Self-pay | Admitting: Internal Medicine

## 2012-01-07 ENCOUNTER — Other Ambulatory Visit: Payer: Self-pay | Admitting: Internal Medicine

## 2012-01-08 ENCOUNTER — Other Ambulatory Visit: Payer: Self-pay | Admitting: Internal Medicine

## 2012-01-09 NOTE — Telephone Encounter (Signed)
Prescription sent to pharmacy.  Past is past due for CPX

## 2012-01-09 NOTE — Telephone Encounter (Signed)
Prescription sent to pharmacy.  Patient is past due for CPX

## 2012-05-08 ENCOUNTER — Other Ambulatory Visit: Payer: Self-pay | Admitting: Internal Medicine

## 2012-05-08 NOTE — Telephone Encounter (Signed)
A1C 250.00 

## 2012-05-20 ENCOUNTER — Other Ambulatory Visit: Payer: Self-pay | Admitting: Internal Medicine

## 2012-05-21 NOTE — Telephone Encounter (Signed)
Dr.Hopper please advise for patient was reminded on several occasions that he is due for an appointment. No pending appointment , Last OV 10/14/2010

## 2012-05-21 NOTE — Telephone Encounter (Signed)
No office visit for 19 months; as per the standard care, unfortunately I cannot refill this. He has been seeing his cardiologist perhaps they will be willing to fill this.

## 2012-07-01 ENCOUNTER — Other Ambulatory Visit: Payer: Self-pay | Admitting: Cardiology

## 2012-07-16 ENCOUNTER — Other Ambulatory Visit: Payer: Self-pay | Admitting: Internal Medicine

## 2012-07-17 NOTE — Telephone Encounter (Signed)
Unfortunately this medication cannot be refilled until evaluation is completed. Last office visit was approximately 2 years ago

## 2012-07-17 NOTE — Telephone Encounter (Signed)
Last OV 2011, no A1c on file for greater than 3 years. Please advise

## 2012-07-20 NOTE — Telephone Encounter (Signed)
Polly called LM on triage stated pt just saw his Diabetes dr and she was not aware medication needed to be filled by dr hopp., but patient needs medication today if possible Cb# [phone]

## 2012-07-20 NOTE — Telephone Encounter (Signed)
I called Polly and informed her she needs to contact patient's DM doctor for refills. Polly verbalized understanding of instruction.

## 2012-07-26 ENCOUNTER — Encounter: Payer: Self-pay | Admitting: Internal Medicine

## 2012-08-10 ENCOUNTER — Encounter: Payer: Self-pay | Admitting: Internal Medicine

## 2012-10-01 ENCOUNTER — Encounter: Payer: Self-pay | Admitting: Internal Medicine

## 2012-11-04 ENCOUNTER — Encounter (HOSPITAL_COMMUNITY): Payer: Self-pay | Admitting: *Deleted

## 2012-11-04 ENCOUNTER — Emergency Department (INDEPENDENT_AMBULATORY_CARE_PROVIDER_SITE_OTHER)
Admission: EM | Admit: 2012-11-04 | Discharge: 2012-11-04 | Disposition: A | Discharge status: Home / Self Care | Payer: BC Managed Care – PPO | Source: Home / Self Care | Attending: Emergency Medicine | Admitting: Emergency Medicine

## 2012-11-04 ENCOUNTER — Emergency Department (INDEPENDENT_AMBULATORY_CARE_PROVIDER_SITE_OTHER): Payer: BC Managed Care – PPO

## 2012-11-04 DIAGNOSIS — J45909 Unspecified asthma, uncomplicated: Secondary | ICD-10-CM

## 2012-11-04 DIAGNOSIS — R911 Solitary pulmonary nodule: Secondary | ICD-10-CM

## 2012-11-04 MED ORDER — AZITHROMYCIN 250 MG PO TABS: ORAL_TABLET | ORAL | Status: DC

## 2012-11-04 MED ORDER — ALBUTEROL SULFATE HFA 108 (90 BASE) MCG/ACT IN AERS: 1.0000 | INHALATION_SPRAY | Freq: Four times a day (QID) | RESPIRATORY_TRACT | Status: DC | PRN

## 2012-11-04 MED ORDER — IPRATROPIUM BROMIDE 0.02 % IN SOLN
0.5000 mg | Freq: Once | RESPIRATORY_TRACT | Status: AC
  Administered 2012-11-04: 0.5 mg via RESPIRATORY_TRACT

## 2012-11-04 MED ORDER — ALBUTEROL SULFATE (5 MG/ML) 0.5% IN NEBU
INHALATION_SOLUTION | RESPIRATORY_TRACT | Status: AC
  Filled 2012-11-04: qty 1

## 2012-11-04 MED ORDER — GUAIFENESIN-CODEINE 100-10 MG/5ML PO SYRP: 10.0000 mL | ORAL_SOLUTION | Freq: Four times a day (QID) | ORAL | Status: DC | PRN

## 2012-11-04 MED ORDER — ALBUTEROL SULFATE (5 MG/ML) 0.5% IN NEBU
5.0000 mg | INHALATION_SOLUTION | Freq: Once | RESPIRATORY_TRACT | Status: AC
  Administered 2012-11-04: 5 mg via RESPIRATORY_TRACT

## 2012-11-04 NOTE — ED Notes (Signed)
Patient complains of chest congestion with worsening cough with wheezing x 1 week. Complains of fever/chills, weakness, body aches x 4 days. Denies nausea, vomiting, diarrhea.

## 2012-11-04 NOTE — ED Provider Notes (Addendum)
Chief Complaint  Patient presents with  . URI    History of Present Illness:   Karl Lawson is a 66 year old male with type II, insulin requiring diabetes, hypertension, and reflux esophagitis. He presents today with a four-day history of dry cough, wheezing and chest tightness, low-grade fever no higher than 99, nasal congestion and rhinorrhea. He denies any shaking chills, chest pain, sore throat, or GI symptoms. He has no history of asthma or COPD, but he is a cigar smoker.  Review of Systems:  Other than noted above, the patient denies any of the following symptoms. Systemic:  No fever, chills, sweats, fatigue, myalgias, headache, or anorexia. Eye:  No redness, pain or drainage. ENT:  No earache, ear congestion, nasal congestion, sneezing, rhinorrhea, sinus pressure, sinus pain, post nasal drip, or sore throat. Lungs:  No cough, sputum production, wheezing, shortness of breath, or chest pain. GI:  No abdominal pain, nausea, vomiting, or diarrhea.  PMFSH:  Past medical history, family history, social history, meds, and allergies were reviewed.  Physical Exam:   Vital signs:  BP 148/80  Pulse 102  Temp 98.6 F (37 C) (Oral)  Resp 24  SpO2 93% General:  Alert, in no distress. Eye:  No conjunctival injection or drainage. Lids were normal. ENT:  TMs and canals were normal, without erythema or inflammation.  Nasal mucosa was clear and uncongested, without drainage.  Mucous membranes were moist.  Pharynx was clear, without exudate or drainage.  There were no oral ulcerations or lesions. Neck:  Supple, no adenopathy, tenderness or mass. Lungs:  No respiratory distress.  He had bilateral expiratory wheezes in all lung fields, no rales or rhonchi. Heart:  Regular rhythm, without gallops, murmers or rubs. Skin:  Clear, warm, and dry, without rash or lesions.  Radiology:  Dg Chest 2 View  11/04/2012  *RADIOLOGY REPORT*  Clinical Data: Non productive cough and wheezing for 3 days. History of  smoking.  CHEST - 2 VIEW  Comparison: 03/28/2009  Findings: The heart is upper limits normal in size.  There is minimal bibasilar atelectasis or scarring.  At the left lung base, there are is a possibility of two small pulmonary nodules.  Further evaluation with chest CT is recommended for further evaluation. There is no evidence for pulmonary edema.  Mild mid thoracic degenerative changes are identified.  IMPRESSION:  1.  Question of pulmonary nodules at the left base.  Non emergent follow-up chest CT with contrast is recommended for further evaluation. 2.  No evidence for acute infectious process.  These results will be called to the ordering clinician or representative by the Radiologist Assistant, and communication documented in the PACS Dashboard.   Original Report Authenticated By: Norva Pavlov, M.D.    I reviewed the images independently and personally and concur with the radiologist's findings.  Course in Urgent Care Center:   He was given a DuoNeb breathing treatment and thereafter felt well although still did have some expiratory wheezes. He was in no respiratory distress and air movement was good. The patient was informed about the results of the chest x-ray, particularly with regard to the 2 nodules at the left base. He was informed of these could represent early cancer and that he would need to have them followed up as soon as possible. I suggested he followup with Dr. Alwyn Ren and we'll send an e-mail and call Dr. Alwyn Ren about this to make sure that he gets followup.  Assessment:  The primary encounter diagnosis was Asthmatic bronchitis. A  diagnosis of Pulmonary nodule, left was also pertinent to this visit.  Plan:   1.  The following meds were prescribed:   New Prescriptions   ALBUTEROL (PROVENTIL HFA;VENTOLIN HFA) 108 (90 BASE) MCG/ACT INHALER    Inhale 1-2 puffs into the lungs every 6 (six) hours as needed for wheezing.   AZITHROMYCIN (ZITHROMAX Z-PAK) 250 MG TABLET    Take as directed.    GUAIFENESIN-CODEINE (GUIATUSS AC) 100-10 MG/5ML SYRUP    Take 10 mLs by mouth 4 (four) times daily as needed for cough.   2.  The patient was instructed in symptomatic care and handouts were given. 3.  The patient was told to return if becoming worse in any way, if no better in 3 or 4 days, and given some red flag symptoms that would indicate earlier return.  Follow up:  The patient was told to follow up with Dr. Alwyn Ren in one week for followup on the pulmonary nodules.    Reuben Likes, MD 11/04/12 1518  Reuben Likes, MD 11/04/12 [phone]

## 2012-11-05 ENCOUNTER — Ambulatory Visit (AMBULATORY_SURGERY_CENTER): Payer: Medicare Other | Admitting: *Deleted

## 2012-11-05 VITALS — Ht 69.0 in | Wt 240.0 lb

## 2012-11-05 DIAGNOSIS — R1310 Dysphagia, unspecified: Secondary | ICD-10-CM

## 2012-11-05 DIAGNOSIS — Z1211 Encounter for screening for malignant neoplasm of colon: Secondary | ICD-10-CM

## 2012-11-05 MED ORDER — SUPREP BOWEL PREP KIT 17.5-3.13-1.6 GM/177ML PO SOLN: ORAL | Status: DC

## 2012-11-05 NOTE — ED Notes (Signed)
Jane from radiology called report on pt's cxr.  ? Pulmonary nodules at the base of the left lung.  Non emergent; F/U chest CT with contrast recommended.  Will notify Dr. Lorenz Coaster.

## 2012-11-19 ENCOUNTER — Encounter: Payer: Self-pay | Admitting: Internal Medicine

## 2012-11-19 ENCOUNTER — Ambulatory Visit (AMBULATORY_SURGERY_CENTER): Payer: Medicare Other | Admitting: Internal Medicine

## 2012-11-19 VITALS — BP 117/69 | HR 100 | Temp 96.8°F | Resp 18 | Ht 69.0 in | Wt 240.0 lb

## 2012-11-19 DIAGNOSIS — K2270 Barrett's esophagus without dysplasia: Secondary | ICD-10-CM | POA: Insufficient documentation

## 2012-11-19 DIAGNOSIS — K222 Esophageal obstruction: Secondary | ICD-10-CM

## 2012-11-19 DIAGNOSIS — Z86010 Personal history of colonic polyps: Secondary | ICD-10-CM

## 2012-11-19 DIAGNOSIS — D126 Benign neoplasm of colon, unspecified: Secondary | ICD-10-CM

## 2012-11-19 DIAGNOSIS — K449 Diaphragmatic hernia without obstruction or gangrene: Secondary | ICD-10-CM

## 2012-11-19 DIAGNOSIS — K5730 Diverticulosis of large intestine without perforation or abscess without bleeding: Secondary | ICD-10-CM

## 2012-11-19 DIAGNOSIS — R1310 Dysphagia, unspecified: Secondary | ICD-10-CM

## 2012-11-19 DIAGNOSIS — K219 Gastro-esophageal reflux disease without esophagitis: Secondary | ICD-10-CM

## 2012-11-19 DIAGNOSIS — Z1211 Encounter for screening for malignant neoplasm of colon: Secondary | ICD-10-CM

## 2012-11-19 HISTORY — DX: Diverticulosis of large intestine without perforation or abscess without bleeding: K57.30

## 2012-11-19 LAB — GLUCOSE, CAPILLARY: Glucose-Capillary: 155 mg/dL — ABNORMAL HIGH (ref 70–99)

## 2012-11-19 MED ORDER — OMEPRAZOLE 40 MG PO CPDR: 40.0000 mg | DELAYED_RELEASE_CAPSULE | Freq: Every day | ORAL | Status: DC

## 2012-11-19 MED ORDER — SODIUM CHLORIDE 0.9 % IV SOLN: 500.0000 mL | INTRAVENOUS | Status: DC

## 2012-11-19 NOTE — Op Note (Signed)
[facility] [address] [place], [address]   COLONOSCOPY PROCEDURE REPORT  PATIENT: Karl, Lawson  MR#: [id number] BIRTHDATE: Jul 14, 1946 , 66  yrs. old GENDER: Male ENDOSCOPIST: Iva Boop, MD, [facility] PROCEDURE DATE:  11/19/2012 PROCEDURE:   Colonoscopy with biopsy and snare polypectomy ASA CLASS:   Class III INDICATIONS:Screening and surveillance,personal history of colonic polyps and Patient's personal history of adenomatous colon polyps.  MEDICATIONS: There was residual sedation effect present from prior procedure, Propofol (Diprivan) 210 mg IV, MAC sedation, administered by CRNA, and These medications were titrated to patient response per physician's verbal order  DESCRIPTION OF PROCEDURE:   After the risks benefits and alternatives of the procedure were thoroughly explained, informed consent was obtained.  A digital rectal exam revealed decreased sphincter tone, A digital rectal exam revealed no rectal mass, and A digital rectal exam revealed the prostate was not enlarged.   The LB PCF-H180AL [id number]  endoscope was introduced through the anus and advanced to the cecum, which was identified by both the appendix and ileocecal valve. No adverse events experienced.   The quality of the prep was Suprep good  The instrument was then slowly withdrawn as the colon was fully examined.      COLON FINDINGS: Two diminutive polypoid shaped sessile polyps were found at the cecum.  A polypectomy was performed with cold forceps. The resection was complete and the polyp tissue was completely retrieved.   Two polypoid shaped sessile polyps measuring 7-10 mm in size were found in the transverse colon and descending colon.  A polypectomy was performed with a cold snare.  The resection was complete and the polyp tissue was completely retrieved.   Moderate diverticulosis was noted The finding was in the left colon.   The colon mucosa was otherwise normal.   A right  colon retroflexion was performed.  Retroflexed views revealed no abnormalities. The time to cecum=1 minutes 52 seconds.  Withdrawal time=21 minutes 42 seconds.  The scope was withdrawn and the procedure completed. COMPLICATIONS: There were no complications.  ENDOSCOPIC IMPRESSION: 1.   Two diminutive sessile polyps were found at the cecum; polypectomy was performed with cold forceps 2.   Two sessile polyps measuring 7-10 mm in size were found in the transverse colon and descending colon; polypectomy was performed with a cold snare 3.   Moderate diverticulosis was noted in the left colon 4.   The colon mucosa was otherwise normal - prior hx 6 adenomas 2010 and 2 polyps in 2004 (inflammatory and one not retrieved)  RECOMMENDATIONS: Timing of repeat colonoscopy will be determined by pathology findings.   eSigned:  Iva Boop, MD, [facility] 11/19/2012 10:40 AM   cc: The Patient   PATIENT NAME:  Karl, Lawson MR#: [id number]

## 2012-11-19 NOTE — Progress Notes (Addendum)
Called to room to assist during endoscopic procedure.  Patient ID and intended procedure confirmed with present staff. Received instructions for my participation in the procedure from the performing physician.   I was in the procedure room for the polyp removed but not for the esophageal dilation.  All information taken from the report

## 2012-11-19 NOTE — Patient Instructions (Addendum)
The esophagus looked slightly inflamed and was narrow (stricture) where it joins the stomach. I dilated or stretched that. I did not see signs of the Barrett's esophagus changes (? Pre-cancerous changes) seen last time.   Four colon polyps were removed and you have diverticulosis (pouches) in the colon.  I will let you know pathology results and when to have another routine colonoscopy by mail.  I do want to see you back in the office in 2 months to make sure swallowing is ok and that heartburn is under control. We can also discuss need to look at the esophagus again - it might be a good idea to be sure things are healed up and there is no pre-cancerous change present.  Thank you for choosing me and [facility].  Iva Boop, MD, FACG   YOU HAD AN ENDOSCOPIC PROCEDURE TODAY AT THE [facility]: Refer to the procedure report that was given to you for any specific questions about what was found during the examination.  If the procedure report does not answer your questions, please call your gastroenterologist to clarify.  If you requested that your care partner not be given the details of your procedure findings, then the procedure report has been included in a sealed envelope for you to review at your convenience later.  YOU SHOULD EXPECT: Some feelings of bloating in the abdomen. Passage of more gas than usual.  Walking can help get rid of the air that was put into your GI tract during the procedure and reduce the bloating. If you had a lower endoscopy (such as a colonoscopy or flexible sigmoidoscopy) you may notice spotting of blood in your stool or on the toilet paper. If you underwent a bowel prep for your procedure, then you may not have a normal bowel movement for a few days.  DIET: FOLLOW DILATATION DIET TODAY  ACTIVITY: Your care partner should take you home directly after the procedure.  You should plan to take it easy, moving slowly for the rest of the day.   You can resume normal activity the day after the procedure however you should NOT DRIVE or use heavy machinery for 24 hours (because of the sedation medicines used during the test).    SYMPTOMS TO REPORT IMMEDIATELY: A gastroenterologist can be reached at any hour.  During normal business hours, 8:30 AM to 5:00 PM Monday through Friday, call [phone].  After hours and on weekends, please call the GI answering service at [phone] who will take a message and have the physician on call contact you.   Following lower endoscopy (colonoscopy or flexible sigmoidoscopy):  Excessive amounts of blood in the stool  Significant tenderness or worsening of abdominal pains  Swelling of the abdomen that is new, acute  Fever of 100F or higher  Following upper endoscopy (EGD)  Vomiting of blood or coffee ground material  New chest pain or pain under the shoulder blades  Painful or persistently difficult swallowing  New shortness of breath  Fever of 100F or higher  Black, tarry-looking stools  FOLLOW UP: If any biopsies were taken you will be contacted by phone or by letter within the next 1-3 weeks.  Call your gastroenterologist if you have not heard about the biopsies in 3 weeks.  Our staff will call the home number listed on your records the next business day following your procedure to check on you and address any questions or concerns that you may have at that time regarding  the information given to you following your procedure. This is a courtesy call and so if there is no answer at the home number and we have not heard from you through the emergency physician on call, we will assume that you have returned to your regular daily activities without incident.  SIGNATURES/CONFIDENTIALITY: You and/or your care partner have signed paperwork which will be entered into your electronic medical record.  These signatures attest to the fact that that the information above on your After Visit Summary  has been reviewed and is understood.  Full responsibility of the confidentiality of this discharge information lies with you and/or your care-partner  DILATATION DIET FOR TODAY (GIVEN TO YOU)  INFORMATION ON POLYPS & DIVERTICULOSIS GIVEN TO YOU TODAY  CALL & MAKE APPOINTMENT TO SEE DR GESSNER IN HIS OFFICE 8 WEEKS FROM TODAY  90 DAY SUPPLY RX OF REFLUX MEDICATION SENT TO [facility] BY DR GESSNER   UNTIL 90 DAY RX GOES THROUGH,  DR GESSNER GAVE YOU PRILOSEC  TO TAKE 2 CAPSULES 30 MINUTES PRIOR TO BREAKFAST OR FIRST MEAL OF THE DAY

## 2012-11-19 NOTE — Progress Notes (Signed)
Patient did not have preoperative order for IV antibiotic SSI prophylaxis. (G8918)  Patient did not experience any of the following events: a burn prior to discharge; a fall within the facility; wrong site/side/patient/procedure/implant event; or a hospital transfer or hospital admission upon discharge from the facility. (G8907)  

## 2012-11-19 NOTE — Op Note (Addendum)
[facility] [address] [place], [address]   ENDOSCOPY PROCEDURE REPORT  PATIENT: Karl, Lawson  MR#: [id number] BIRTHDATE: 05-10-1946 , 66  yrs. old GENDER: Male ENDOSCOPIST: Iva Boop, MD, [facility] PROCEDURE DATE:  11/19/2012 PROCEDURE:  Esophagoscopy and Maloney dilation of esophagus ASA CLASS:     Class III INDICATIONS:  Dysphagia.   follow up of Barrett's esophagus. MEDICATIONS: Propofol (Diprivan) 120 mg IV, MAC sedation, administered by CRNA, These medications were titrated to patient response per physician's verbal order, and Robinul 0.2 mg IV TOPICAL ANESTHETIC: Cetacaine Spray  DESCRIPTION OF PROCEDURE: After the risks benefits and alternatives of the procedure were thoroughly explained, informed consent was obtained.  [facility] GIF-H180 [id number] endoscope was introduced through the mouth and advanced to the stomach antrum. Without limitations. The instrument was slowly withdrawn as the mucosa was fully examined.        ESOPHAGUS: A stricture was found at the gastroesophageal junction. The stenosis was traversable with the endoscope. Mild inflammatory changes seen, no columnar tongues were seen (no signs of Barrett's) A 1 cm hiatal hernia was noted.  STOMACH: The mucosa of the stomach appeared normal.  Retroflexed views revealed a hiatal hernia.     The scope was then withdrawn from the patient, a 35 [place] Elease Hashimoto was passed with mild resistance and no heme. Reinspection showed appropriate heme and dilation effect, the scope was removed and the procedure completed.  COMPLICATIONS: There were no complications. ENDOSCOPIC IMPRESSION: 1.   Stricture was found at the gastroesophageal junction- dilated 54 French maloney 2.   1 cm hiatal hernia 3.   The mucosa of the stomach appeared normal - exam to antrum 4. ? if previous bxs thought to be Barrett's were related to hiatal hernia  RECOMMENDATIONS: 1.  Clear liquids until 1230, then soft  foods rest of day.  Resume prior diet tomorrow. 2.  Call for office visit (call this week) to be seen in 8 weeks - need to be sure dysphagia resolved and discuss repeat endoscopy need. 3. Stay on omeprazole 40 mg daily   eSigned:  Iva Boop, MD, [facility] 11/19/2012 10:41 AM Revised: 11/19/2012 10:41 AM  CC:The Patient

## 2012-11-20 ENCOUNTER — Telehealth: Payer: Self-pay

## 2012-11-20 NOTE — Telephone Encounter (Signed)
  Follow up Call-  Call back number 11/19/2012  Post procedure Call Back phone  # [phone]  Permission to leave phone message Yes     Patient questions:  Do you have a fever, pain , or abdominal swelling? no Pain Score  0 *  Have you tolerated food without any problems? no  Have you been able to return to your normal activities? yes  Do you have any questions about your discharge instructions: Diet   no Medications  no Follow up visit  no  Do you have questions or concerns about your Care? no  Actions: * If pain score is 4 or above: No action needed, pain <4.   Per the pt he ate a soft fish last night and he said, "it did get stuck".  I reminded him he was supposed to be on a soft diet last night.  He was drinking coffee this am when I called and was not having any diffiuclity swallowing it.  I told him he could try to advance to his regular diet today.  To call if difficulity swallowing persist.  He said he would. Maw

## 2012-11-22 ENCOUNTER — Encounter: Payer: Self-pay | Admitting: Internal Medicine

## 2012-11-22 ENCOUNTER — Telehealth: Payer: Self-pay | Admitting: Internal Medicine

## 2012-11-22 NOTE — Telephone Encounter (Signed)
Spoke to his wife and assured her rx went to the pharmacy requested, not [facility].

## 2012-11-22 NOTE — Progress Notes (Signed)
Quick Note:  4 adenomas max 10 mm ______

## 2012-11-27 ENCOUNTER — Encounter: Payer: Self-pay | Admitting: Internal Medicine

## 2012-11-27 ENCOUNTER — Ambulatory Visit (INDEPENDENT_AMBULATORY_CARE_PROVIDER_SITE_OTHER): Payer: Medicare Other | Admitting: Internal Medicine

## 2012-11-27 VITALS — BP 118/70 | HR 88 | Wt 251.0 lb

## 2012-11-27 DIAGNOSIS — R918 Other nonspecific abnormal finding of lung field: Secondary | ICD-10-CM

## 2012-11-27 DIAGNOSIS — R9389 Abnormal findings on diagnostic imaging of other specified body structures: Secondary | ICD-10-CM | POA: Insufficient documentation

## 2012-11-27 DIAGNOSIS — Z87891 Personal history of nicotine dependence: Secondary | ICD-10-CM

## 2012-11-27 DIAGNOSIS — I10 Essential (primary) hypertension: Secondary | ICD-10-CM

## 2012-11-27 DIAGNOSIS — IMO0001 Reserved for inherently not codable concepts without codable children: Secondary | ICD-10-CM

## 2012-11-27 DIAGNOSIS — E785 Hyperlipidemia, unspecified: Secondary | ICD-10-CM

## 2012-11-27 LAB — BUN: BUN: 20 mg/dL (ref 6–23)

## 2012-11-27 NOTE — Progress Notes (Signed)
  Subjective:    Patient ID: Karl Lawson., male    DOB: 1946-07-18, 67 y.o.   MRN: [id number]  HPI He was seen 11/04/12 at the urgent care for asthmatic bronchitis. He was seen for nonproductive cough and wheezing for 3 days. He has no history of asthma. He has a smoking history of at least 30 pack years of cigarettes as well as subsequent history of smoking 2-3 cigars a day for 17 years.  Chest x-ray suggested possible pulmonary nodules at the left base. Followup CT was recommended. The prescription was "possible 2 small pulmonary nodules.  At this time he denies cough, sputum,hemoptysis, dyspnea, fever, chills, sweats or weight loss.    Review of Systems HYPERTENSION: Disease Monitoring:average < 140/90 Compliant with present antihypertensive regimen; not on ACE-I   DIABETES  : Disease Monitoring: Blood Sugar: range 110-115  Medication Compliance: yes Diet: low carb  Exercise : none   Ophth exam current; cataract surgery pending Podiatry exam not current  Hypoglycemic symptoms: occasionally middle of night to 80  HYPERLIPIDEMIA: Diet & exercise as noted Medication Compliance:  Statin taken  FH premature CAD / CVA :  no (updated & recorded in  FH)    Past medical history/family history/social history were all reviewed and updated. CAP 1968 in [place]    Significant headache:no Chest pain, palpitations , claudications, PNDyspnea:no      Lightheadedness,Syncope: no   Edema:no Polyuria/phagia/dipsia : no Numbness & tingling :no Non healing skin lesions:  no    Blurred , double or loss of vision:no Abd pain, bowel changes:no  Myalgias:no             Objective:   Physical Exam General appearance:adequate nourishment w/o distress.  Eyes: No conjunctival inflammation or scleral icterus is present.faint arcus  Oral exam: Dentures; lips and gums are healthy appearing.There is mild oropharyngeal erythema ;no exudate noted.   Heart:  Normal rate and regular  rhythm. S1 and S2 normal without gallop, murmur, click, rub .S4 w/o extra sounds     Lungs:Chest clear to auscultation; no wheezes, rhonchi,rales ,or rubs present.No increased work of breathing.   Abdomen: bowel sounds normal, soft and non-tender without masses, organomegaly or hernias noted.  No guarding or rebound   Skin:Warm & dry.  Intact without suspicious lesions or rashes ; no jaundice or tenting  Lymphatic: No lymphadenopathy is noted about the head, neck, axilla  Chronically deformed  R  fingernails       Assessment & Plan:  #1 possible left lower lobe pulmonary nodules in the context of at least 30 pack years of smoking. Remote history of pneumonia while in [place] 1968.  #2 diabetes; history suggest that control. The concern is nocturnal hypoglycemia. The should be discussed with Dr. Talmage Nap.  Plan: Noncontrast CT of the lungs

## 2012-11-27 NOTE — Patient Instructions (Addendum)
Do not take the metformin the day before or for 2 days after the CAT scan. Stay well hydrated; drink to thirst up to 32-40 ounces of fluids a day during the same period Share results with all non [facility] medical staff seen    If you activate My Chart; the results can be released to you as soon as they populate from the lab. If you choose not to use this program; the labs have to be reviewed, copied & mailed causing a delay in getting the results to you.

## 2012-12-04 ENCOUNTER — Ambulatory Visit (INDEPENDENT_AMBULATORY_CARE_PROVIDER_SITE_OTHER)
Admission: RE | Admit: 2012-12-04 | Discharge: 2012-12-04 | Disposition: A | Discharge status: Home / Self Care | Payer: Medicare Other | Source: Ambulatory Visit | Attending: Internal Medicine | Admitting: Internal Medicine

## 2012-12-04 DIAGNOSIS — Z87891 Personal history of nicotine dependence: Secondary | ICD-10-CM

## 2012-12-04 DIAGNOSIS — R918 Other nonspecific abnormal finding of lung field: Secondary | ICD-10-CM

## 2012-12-04 MED ORDER — IOHEXOL 300 MG/ML  SOLN
80.0000 mL | Freq: Once | INTRAMUSCULAR | Status: AC | PRN
  Administered 2012-12-04: 80 mL via INTRAVENOUS

## 2012-12-22 ENCOUNTER — Other Ambulatory Visit: Payer: Self-pay

## 2013-01-22 ENCOUNTER — Ambulatory Visit: Payer: Medicare Other | Admitting: Internal Medicine

## 2013-02-20 ENCOUNTER — Other Ambulatory Visit: Payer: Self-pay | Admitting: Cardiology

## 2013-03-04 ENCOUNTER — Encounter: Payer: Self-pay | Admitting: Family Medicine

## 2013-03-04 ENCOUNTER — Telehealth: Payer: Self-pay | Admitting: Internal Medicine

## 2013-03-04 ENCOUNTER — Ambulatory Visit (INDEPENDENT_AMBULATORY_CARE_PROVIDER_SITE_OTHER): Payer: Medicare Other | Admitting: Family Medicine

## 2013-03-04 VITALS — BP 120/70 | HR 85 | Temp 98.0°F | Wt 256.0 lb

## 2013-03-04 DIAGNOSIS — F341 Dysthymic disorder: Secondary | ICD-10-CM

## 2013-03-04 DIAGNOSIS — R0989 Other specified symptoms and signs involving the circulatory and respiratory systems: Secondary | ICD-10-CM

## 2013-03-04 DIAGNOSIS — J209 Acute bronchitis, unspecified: Secondary | ICD-10-CM

## 2013-03-04 DIAGNOSIS — F418 Other specified anxiety disorders: Secondary | ICD-10-CM | POA: Insufficient documentation

## 2013-03-04 MED ORDER — ALBUTEROL SULFATE HFA 108 (90 BASE) MCG/ACT IN AERS: 1.0000 | INHALATION_SPRAY | Freq: Four times a day (QID) | RESPIRATORY_TRACT | Status: DC | PRN

## 2013-03-04 MED ORDER — ALBUTEROL SULFATE (2.5 MG/3ML) 0.083% IN NEBU
2.5000 mg | INHALATION_SOLUTION | Freq: Once | RESPIRATORY_TRACT | Status: AC
  Administered 2013-03-04: 2.5 mg via RESPIRATORY_TRACT

## 2013-03-04 MED ORDER — CLONAZEPAM 0.5 MG PO TABS: 0.5000 mg | ORAL_TABLET | Freq: Two times a day (BID) | ORAL | Status: DC | PRN

## 2013-03-04 MED ORDER — AZITHROMYCIN 250 MG PO TABS: ORAL_TABLET | ORAL | Status: DC

## 2013-03-04 MED ORDER — SERTRALINE HCL 50 MG PO TABS: 50.0000 mg | ORAL_TABLET | Freq: Every day | ORAL | Status: DC

## 2013-03-04 NOTE — Progress Notes (Signed)
  Subjective:     Karl Lawson is a 67 y.o. male here for evaluation of a cough. Onset of symptoms was several days ago. Symptoms have been gradually worsening since that time. The cough is barky and is aggravated by pollens and reclining position. Associated symptoms include: sputum production and wheezing. Patient does not have a history of asthma. Patient does not have a history of environmental allergens. Patient has not traveled recently. Patient does have a history of smoking. Patient has had a previous chest x-ray. Patient has not had a PPD done.  Pt also admits to feeling jittery and depressed.  Pt is having trouble sleeping-- he will drink 1-2 glasses of wine and then he is up at 4am.  Pt is not suicidal.    The following portions of the patient's history were reviewed and updated as appropriate: allergies, current medications, past family history, past medical history, past social history, past surgical history and problem list.  Review of Systems Pertinent items are noted in HPI.    Objective:    Oxygen saturation 97% on room air BP 120/70  Pulse 85  Temp(Src) 98 F (36.7 C) (Oral)  Wt 256 lb (116.121 kg)  BMI 37.79 kg/m2  SpO2 97% General appearance: alert, cooperative, appears stated age and no distress Lungs: wheezes bilaterally Heart: S1, S2 normal Psych-- pt admits to feeling anxious and depressed.  Pt is not suicidal      Assessment:    Acute Bronchitis    Plan:    Antibiotics per medication orders. Antitussives per medication orders. Avoid exposure to tobacco smoke and fumes. B-agonist inhaler. Call if shortness of breath worsens, blood in sputum, change in character of cough, development of fever or chills, inability to maintain nutrition and hydration. Avoid exposure to tobacco smoke and fumes. Steroid inhaler as ordered.

## 2013-03-04 NOTE — Addendum Note (Signed)
Addended by: Arnette Norris on: 03/04/2013 04:58 PM   Modules accepted: Orders

## 2013-03-04 NOTE — Assessment & Plan Note (Signed)
zoloft 50 mg  1/2 po qd for 8 days then increase 1 tab qd  Klonopin 0.5 mg 1 po bid prn

## 2013-03-04 NOTE — Telephone Encounter (Signed)
Patient Information:  Caller Name: Dauntae  Phone: [phone]  Patient: Karl Lawson, Karl Lawson  Gender: Male  DOB: August 08, 1946  Age: 67 Years  PCP: Marga Melnick  Office Follow Up:  Does the office need to follow up with this patient?: No  Instructions For The Office: N/A  RN Note:  pt also reports that he has wheezing in his chest at times.  Pt reports he is having SOB with going up steps  Symptoms  Reason For Call & Symptoms: jittery, no appetite, cant sleep.  Pt denies any pain.  Pt reports he quit smoking on 02/24/13.  Pt is using ecig.  Reviewed Health History In EMR: Yes  Reviewed Medications In EMR: Yes  Reviewed Allergies In EMR: Yes  Reviewed Surgeries / Procedures: Yes  Date of Onset of Symptoms: 02/28/2013  Guideline(s) Used:  Breathing Difficulty  Disposition Per Guideline:   Go to Office Now  Reason For Disposition Reached:   Mild difficulty breathing (e.g., minimal/no SOB at rest, SOB with walking, pulse < 100) of new onset or worse than normal  Advice Given:  N/A  Patient Will Follow Care Advice:  YES  Appointment Scheduled:  03/04/2013 15:45:00 Appointment Scheduled Provider:  Lelon Perla.  (RN offered pt a sooner appt but pt states he had to be on a job and needed the latest appt )

## 2013-03-04 NOTE — Patient Instructions (Signed)

## 2013-03-29 ENCOUNTER — Encounter: Payer: Self-pay | Admitting: Lab

## 2013-04-02 ENCOUNTER — Ambulatory Visit (INDEPENDENT_AMBULATORY_CARE_PROVIDER_SITE_OTHER): Payer: Medicare Other | Admitting: Internal Medicine

## 2013-04-02 ENCOUNTER — Encounter: Payer: Self-pay | Admitting: Internal Medicine

## 2013-04-02 VITALS — BP 128/70 | HR 82 | Wt 254.0 lb

## 2013-04-02 DIAGNOSIS — F4323 Adjustment disorder with mixed anxiety and depressed mood: Secondary | ICD-10-CM | POA: Insufficient documentation

## 2013-04-02 DIAGNOSIS — R918 Other nonspecific abnormal finding of lung field: Secondary | ICD-10-CM

## 2013-04-02 LAB — T4, FREE: Free T4: 0.76 ng/dL (ref 0.60–1.60)

## 2013-04-02 MED ORDER — SERTRALINE HCL 50 MG PO TABS: ORAL_TABLET | ORAL | Status: DC

## 2013-04-02 NOTE — Progress Notes (Signed)
  Subjective:    Patient ID: Karl Lawson, male    DOB: 08-01-1946, 67 y.o.   MRN: [id number]  HPI  He feels that he is 50% better with the clonazepam for anxiety and the sertraline. He is only taking clonazepam on average once a day. He feels that the sertraline has helped the "sadness" he was experiencing.  He expresses concerns about getting older and having associates his age die.  He is unaware of any family history of anxiety or depression.  In reviewing the chart his last thyroid function test was December 2011. Was therapeutic at 1.75.    Review of Systems His appetite is "okay". He sleeps on average 6-1/2 hours a night from 11:30 PM to 5 AM; he awakens rested.  He denies blurred vision, double vision, loss of vision. He has no hoarseness or trouble swallowing. Constipation or diarrhea are not present. Other than some thinning of the hair he has no skin or nail changes otherwise.  He denies a constellation of headache, flushing, chest pain, &  diarrhea       Objective:   Physical Exam Appears  well-nourished & in no acute distress  No carotid bruits are present. Thyroid normal to palpation  Heart rhythm and rate are normal with no significant murmurs or gallops.S4  Chest is clear with no increased work of breathing   No clubbing, cyanosis or edema present.  Pedal pulses are intact   No ischemic skin changes are present . Nails  with chronic fungal changes  RUE. No onycholysis  Oriented but affect flat. Strength, tone, DTRs reflexes normal. No tremor          Assessment & Plan:  See Current Assessment & Plan in Problem List under specific Diagnosis

## 2013-04-02 NOTE — Assessment & Plan Note (Signed)
Check TFTs Increase Sertraline to 75 mg daily The pathophysiology of neurotransmitter deficiency was discussed along with the benefits and potential adverse effects of SSRI therapy.

## 2013-04-02 NOTE — Patient Instructions (Addendum)
Share results with all non [facility] medical staff seen  

## 2013-04-05 ENCOUNTER — Other Ambulatory Visit: Payer: Self-pay | Admitting: Internal Medicine

## 2013-04-05 ENCOUNTER — Ambulatory Visit (INDEPENDENT_AMBULATORY_CARE_PROVIDER_SITE_OTHER)
Admission: RE | Admit: 2013-04-05 | Discharge: 2013-04-05 | Disposition: A | Discharge status: Home / Self Care | Payer: Medicare Other | Source: Ambulatory Visit | Attending: Internal Medicine | Admitting: Internal Medicine

## 2013-04-05 DIAGNOSIS — R918 Other nonspecific abnormal finding of lung field: Secondary | ICD-10-CM

## 2013-04-17 ENCOUNTER — Encounter: Payer: Self-pay | Admitting: Internal Medicine

## 2013-06-08 ENCOUNTER — Other Ambulatory Visit: Payer: Self-pay | Admitting: Internal Medicine

## 2013-06-12 ENCOUNTER — Other Ambulatory Visit: Payer: Self-pay

## 2013-08-12 ENCOUNTER — Other Ambulatory Visit: Payer: Self-pay | Admitting: Family Medicine

## 2013-09-12 ENCOUNTER — Other Ambulatory Visit: Payer: Self-pay

## 2013-11-19 ENCOUNTER — Other Ambulatory Visit: Payer: Self-pay | Admitting: *Deleted

## 2013-11-19 MED ORDER — ALBUTEROL SULFATE HFA 108 (90 BASE) MCG/ACT IN AERS: INHALATION_SPRAY | RESPIRATORY_TRACT | Status: DC

## 2013-12-30 ENCOUNTER — Telehealth: Payer: Self-pay | Admitting: Internal Medicine

## 2013-12-30 MED ORDER — OMEPRAZOLE 40 MG PO CPDR: 40.0000 mg | DELAYED_RELEASE_CAPSULE | Freq: Every day | ORAL | Status: DC

## 2014-04-21 ENCOUNTER — Emergency Department (HOSPITAL_COMMUNITY)
Admission: EM | Admit: 2014-04-21 | Discharge: 2014-04-21 | Disposition: A | Discharge status: Home / Self Care | Payer: Medicare Other | Source: Home / Self Care | Attending: Family Medicine | Admitting: Family Medicine

## 2014-04-21 ENCOUNTER — Emergency Department (INDEPENDENT_AMBULATORY_CARE_PROVIDER_SITE_OTHER): Payer: Medicare Other

## 2014-04-21 ENCOUNTER — Encounter (HOSPITAL_COMMUNITY): Payer: Self-pay | Admitting: Emergency Medicine

## 2014-04-21 DIAGNOSIS — J45909 Unspecified asthma, uncomplicated: Secondary | ICD-10-CM

## 2014-04-21 MED ORDER — BENZONATATE 100 MG PO CAPS: 100.0000 mg | ORAL_CAPSULE | Freq: Three times a day (TID) | ORAL | Status: DC

## 2014-04-21 MED ORDER — ALBUTEROL SULFATE (2.5 MG/3ML) 0.083% IN NEBU
INHALATION_SOLUTION | RESPIRATORY_TRACT | Status: AC
  Filled 2014-04-21: qty 3

## 2014-04-21 MED ORDER — ALBUTEROL SULFATE (2.5 MG/3ML) 0.083% IN NEBU
5.0000 mg | INHALATION_SOLUTION | Freq: Once | RESPIRATORY_TRACT | Status: AC
  Administered 2014-04-21: 5 mg via RESPIRATORY_TRACT

## 2014-04-21 MED ORDER — IPRATROPIUM BROMIDE 0.02 % IN SOLN
0.5000 mg | Freq: Once | RESPIRATORY_TRACT | Status: AC
  Administered 2014-04-21: 0.5 mg via RESPIRATORY_TRACT

## 2014-04-21 MED ORDER — PREDNISONE 20 MG PO TABS
60.0000 mg | ORAL_TABLET | Freq: Once | ORAL | Status: AC
  Administered 2014-04-21: 60 mg via ORAL

## 2014-04-21 MED ORDER — IPRATROPIUM-ALBUTEROL 0.5-2.5 (3) MG/3ML IN SOLN
RESPIRATORY_TRACT | Status: AC
  Filled 2014-04-21: qty 3

## 2014-04-21 MED ORDER — PREDNISONE 20 MG PO TABS
ORAL_TABLET | ORAL | Status: AC
  Filled 2014-04-21: qty 3

## 2014-04-21 MED ORDER — PREDNISONE 10 MG PO TABS: ORAL_TABLET | ORAL | Status: DC

## 2014-04-21 MED ORDER — ALBUTEROL SULFATE HFA 108 (90 BASE) MCG/ACT IN AERS: 1.0000 | INHALATION_SPRAY | Freq: Four times a day (QID) | RESPIRATORY_TRACT | Status: DC | PRN

## 2014-04-21 NOTE — ED Provider Notes (Signed)
CSN: [id number]     Arrival date & time 04/21/14  1709 History   First MD Initiated Contact with Patient 04/21/14 1805     Chief Complaint  Patient presents with  . URI   (Consider location/radiation/quality/duration/timing/severity/associated sxs/prior Treatment) HPI Comments: Former smoker. Quit 13 mos ago  Patient is a 68 y.o. male presenting with URI. The history is provided by the patient.  URI Presenting symptoms: congestion, cough, fever, rhinorrhea and sore throat   Presenting symptoms: no ear pain, no facial pain and no fatigue   Severity:  Moderate Onset quality:  Gradual Duration:  5 days Timing:  Constant Progression:  Unchanged Chronicity:  New Associated symptoms: no wheezing   Risk factors comment:  Reports grandson also sick with URI   Past Medical History  Diagnosis Date  . Diabetes mellitus   . Hypertension   . Hyperlipidemia   . Personal history of adenomatous colonic polyps 07/17/2008    2004 2 polyps max 8 mm (adenomas) 2010 7 polyps TV and tubular adenomas max 6 mm 11/19/2012    . Barrett's esophagus 11/19/2012     initially dxed 2010   . CAP (community acquired pneumonia) 1968    [place] ([place])  . Diverticulosis of colon 11/19/12    left colon   Past Surgical History  Procedure Laterality Date  . Hernia repair  1997    left inguinal  . Appendectomy  1959  . Esophagogastroduodenoscopy  multiple  . Colonoscopy  multiple    adenomatous polyps, Dr Carlean Purl   Family History  Problem Relation Age of Onset  . Heart disease Father   . Stroke Father   . Asthma Neg Hx   . Diabetes Neg Hx   . Alzheimer's disease Mother   . Pulmonary embolism Brother    History  Substance Use Topics  . Smoking status: Former Smoker    Types: Cigars    Start date: 10/07/2012    Quit date: 10/31/2012  . Smokeless tobacco: Never Used     Comment: smoked ages [age], up to 1 ppd . Smoked 2-3 cigars/ day [age]  . Alcohol Use: 1.8 oz/week    3 Cans of beer per  week    Review of Systems  Constitutional: Positive for fever. Negative for fatigue.  HENT: Positive for congestion, rhinorrhea and sore throat. Negative for ear pain.   Eyes: Negative.   Respiratory: Positive for cough. Negative for chest tightness, shortness of breath and wheezing.   Cardiovascular: Negative.   Gastrointestinal: Negative.   Musculoskeletal: Negative.     Allergies  Review of patient's allergies indicates no known allergies.  Home Medications   Prior to Admission medications   Medication Sig Start Date End Date Taking? Authorizing Provider  albuterol (PROAIR HFA) 108 (90 BASE) MCG/ACT inhaler INHALE 1 TO 2 PUFFS INTO THE LUNGS EVERY 6 HOURS AS NEEDED FOR WHEEZING 11/19/13   Hendricks Limes, MD  albuterol (PROVENTIL HFA;VENTOLIN HFA) 108 (90 BASE) MCG/ACT inhaler Inhale 1-2 puffs into the lungs every 6 (six) hours as needed for wheezing or shortness of breath. 04/21/14   Lahoma Rocker, PA  amLODipine (NORVASC) 5 MG tablet Take 5 mg by mouth daily.    Historical Provider, MD  aspirin EC 81 MG tablet Take 81 mg by mouth daily.    Historical Provider, MD  benzonatate (TESSALON) 100 MG capsule Take 1 capsule (100 mg total) by mouth every 8 (eight) hours. 04/21/14   Lahoma Rocker, PA  clonazePAM Bobbye [place]) 0.5  MG tablet Take 1 tablet (0.5 mg total) by mouth 2 (two) times daily as needed for anxiety. 03/04/13   Rosalita Chessman, DO  ezetimibe-simvastatin (VYTORIN) 10-20 MG per tablet As directed: 1/2 by mouth daily, Rx'ed by cardiologist 07/01/12   Larey Dresser, MD  finasteride (PROSCAR) 5 MG tablet Take 5 mg by mouth daily.    Historical Provider, MD  Insulin Aspart (NOVOLOG [place]) Inject into the skin 2 (two) times daily. 50 Units in AM/40 Units in PM Novolog 70/30    Historical Provider, MD  losartan-hydrochlorothiazide (HYZAAR) 100-25 MG per tablet Take 1 tablet by mouth daily.    Historical Provider, MD  metFORMIN (GLUCOPHAGE) 1000 MG tablet TAKE 1 TABLET TWICE  A DAY WITH MEALS 05/08/12   Hendricks Limes, MD  Multiple Vitamins-Minerals (MULTIVITAMIN WITH MINERALS) tablet Take 1 tablet by mouth daily.    Historical Provider, MD  omeprazole (PRILOSEC) 40 MG capsule Take 1 capsule (40 mg total) by mouth daily before breakfast. 12/30/13   Gatha Mayer, MD  predniSONE (DELTASONE) 10 MG tablet Beginning 04-22-2014, take 4 tabs po qd day 1, 3 tabs po qd day 2, 2 tabs po qd day 3, 1 tab po qd day 4 then stop 04/21/14   Lahoma Rocker, PA  sertraline (ZOLOFT) 50 MG tablet TAKE 1 & 1/2 TABLET BY MOUTH EVERY DAY 06/08/13   Hendricks Limes, MD   BP 132/82  Pulse 92  Temp(Src) 98.4 F (36.9 C)  Resp 16  SpO2 92% Physical Exam  Nursing note and vitals reviewed. Constitutional: He is oriented to person, place, and time. He appears well-developed and well-nourished. No distress.  HENT:  Head: Normocephalic and atraumatic.  Mouth/Throat: Oropharynx is clear and moist.  Eyes: Conjunctivae are normal. No scleral icterus.  Cardiovascular: Normal rate, regular rhythm and normal heart sounds.   Pulmonary/Chest: Effort normal. No respiratory distress. He has wheezes. He has no rales. He exhibits no tenderness.  Abdominal: Soft. Bowel sounds are normal. He exhibits no distension. There is no tenderness.  +obese  Musculoskeletal: Normal range of motion.  Neurological: He is alert and oriented to person, place, and time.  Skin: Skin is warm and dry. No rash noted. No erythema.  Psychiatric: He has a normal mood and affect. His behavior is normal.    ED Course  Procedures (including critical care time) Labs Review Labs Reviewed - No data to display  Imaging Review Dg Chest 2 View  04/21/2014   CLINICAL DATA:  Cough, congestion.  EXAM: CHEST  2 VIEW  COMPARISON:  11/04/2012  FINDINGS: There is hyperinflation of the lungs compatible with COPD. Heart is borderline in size. No confluent airspace opacities or effusions. No acute bony abnormality.  IMPRESSION: COPD.   No active disease.   Electronically Signed   By: Rolm Baptise M.D.   On: 04/21/2014 18:53     MDM   1. Asthmatic bronchitis   Films without evidence of acute illness. Given 60mg  oral prednisone and albuterol 5mg  and atrovent 0.5mg  neb treatment in clinic and patient reports he is feeling much better following treatment. Will discharge home with Rx for tessalon, albuterol MDI and prednisone taper. Advised close follow up with PCP should symptoms not begin to improve over the next several days.    [place], [place] 04/21/14 [phone]

## 2014-04-21 NOTE — Discharge Instructions (Signed)
You have no evidence of pneumonia and you are likely experiencing a viral bronchitis. You do not need antibiotics. Please use prednisone and albuterol as prescribed along with cough suppressant. You can expect to cough for another 7-10 days. If you develop fever >100.5, please follow up with your doctor for re-evaluation.  Bronchitis Bronchitis is inflammation of the airways that extend from the windpipe into the lungs (bronchi). The inflammation often causes mucus to develop, which leads to a cough. If the inflammation becomes severe, it may cause shortness of breath. CAUSES  Bronchitis may be caused by:   Viral infections.   Bacteria.   Cigarette smoke.   Allergens, pollutants, and other irritants.  SIGNS AND SYMPTOMS  The most common symptom of bronchitis is a frequent cough that produces mucus. Other symptoms include:  Fever.   Body aches.   Chest congestion.   Chills.   Shortness of breath.   Sore throat.  DIAGNOSIS  Bronchitis is usually diagnosed through a medical history and physical exam. Tests, such as chest X-rays, are sometimes done to rule out other conditions.  TREATMENT  You may need to avoid contact with whatever caused the problem (smoking, for example). Medicines are sometimes needed. These may include:  Antibiotics. These may be prescribed if the condition is caused by bacteria.  Cough suppressants. These may be prescribed for relief of cough symptoms.   Inhaled medicines. These may be prescribed to help open your airways and make it easier for you to breathe.   Steroid medicines. These may be prescribed for those with recurrent (chronic) bronchitis. HOME CARE INSTRUCTIONS  Get plenty of rest.   Drink enough fluids to keep your urine clear or pale yellow (unless you have a medical condition that requires fluid restriction). Increasing fluids may help thin your secretions and will prevent dehydration.   Only take over-the-counter or  prescription medicines as directed by your health care provider.  Only take antibiotics as directed. Make sure you finish them even if you start to feel better.  Avoid secondhand smoke, irritating chemicals, and strong fumes. These will make bronchitis worse. If you are a smoker, quit smoking. Consider using nicotine gum or skin patches to help control withdrawal symptoms. Quitting smoking will help your lungs heal faster.   Put a cool-mist humidifier in your bedroom at night to moisten the air. This may help loosen mucus. Change the water in the humidifier daily. You can also run the hot water in your shower and sit in the bathroom with the door closed for 5 10 minutes.   Follow up with your health care provider as directed.   Wash your hands frequently to avoid catching bronchitis again or spreading an infection to others.  SEEK MEDICAL CARE IF: Your symptoms do not improve after 1 week of treatment.  SEEK IMMEDIATE MEDICAL CARE IF:  Your fever increases.  You have chills.   You have chest pain.   You have worsening shortness of breath.   You have bloody sputum.  You faint.  You have lightheadedness.  You have a severe headache.   You vomit repeatedly. MAKE SURE YOU:   Understand these instructions.  Will watch your condition.  Will get help right away if you are not doing well or get worse. Document Released: 10/24/2005 Document Revised: 08/14/2013 Document Reviewed: 06/18/2013 [facility] Patient Information 2014 [place].

## 2014-04-21 NOTE — ED Notes (Signed)
C/o cold sx for six days  States he has a fever, chest congestion, non productive cough, watery eyes and sneezing tx tried was tylenol and ibuprofen Admits to smoking electronic cigs

## 2014-04-22 NOTE — ED Provider Notes (Signed)
Medical screening examination/treatment/procedure(s) were performed by resident physician or non-physician practitioner and as supervising physician I was immediately available for consultation/collaboration.   Pauline Good MD.   Billy Fischer, MD 04/22/14 [phone]

## 2014-06-08 IMAGING — CT CT CHEST W/O CM
2 of 3 series · 15 of 36 positions shown, 18 images · IV contrast (Omnipaque 300)
Comparison: 12/04/2012

CLINICAL DATA: Evaluate pulmonary nodules, former smoker

CT CHEST WITHOUT CONTRAST
TECHNIQUE: Multidetector CT imaging of the chest was performed
following the standard protocol without IV contrast.

[Series 3: chest routine with · axial · 0.94mm/px · z∈[-344,-64]mm · 12 of 66 slices shown, 15 images]
[im 5/66  mediastinal]
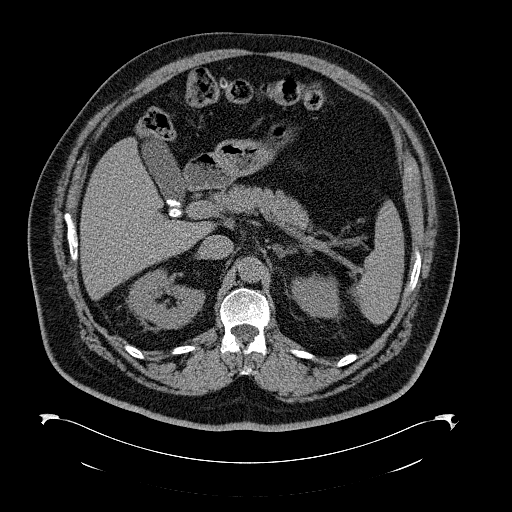
[im 5/66  lung]
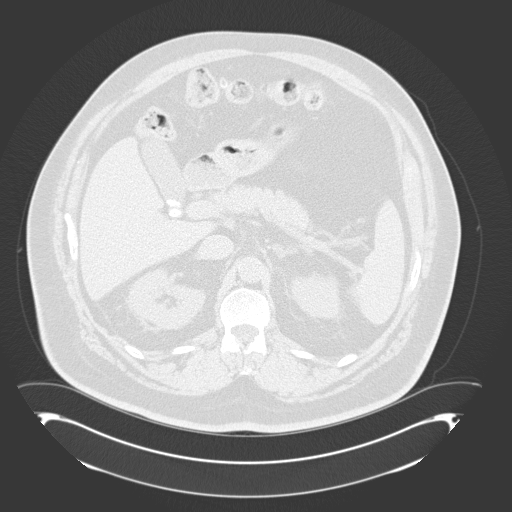
[im 10/66  lung]
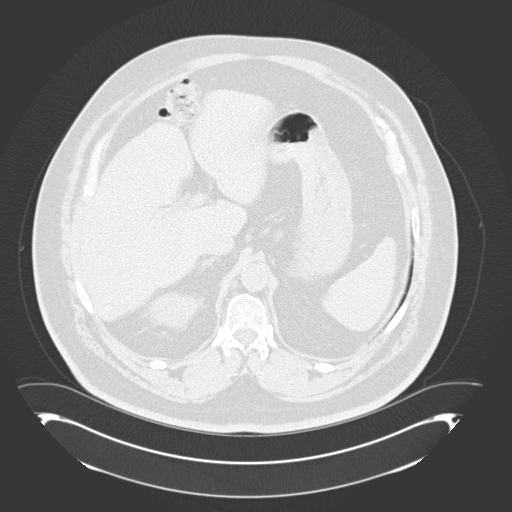
[im 15/66  lung]
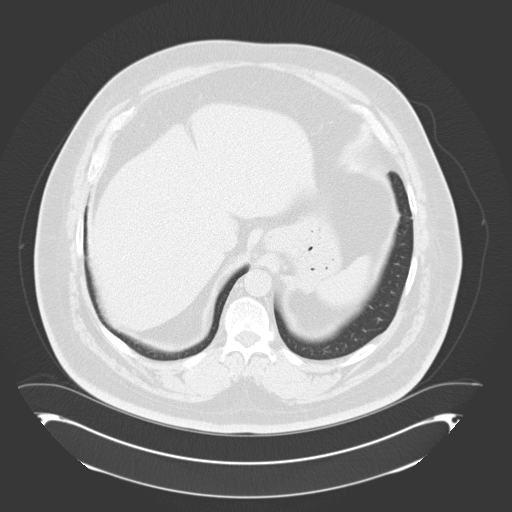
[im 20/66  lung]
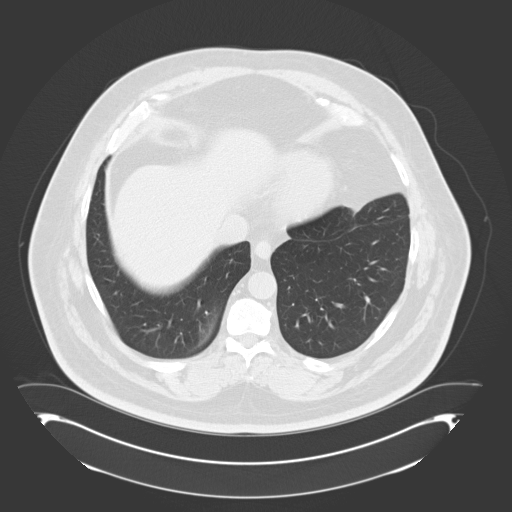
[im 25/66  mediastinal]
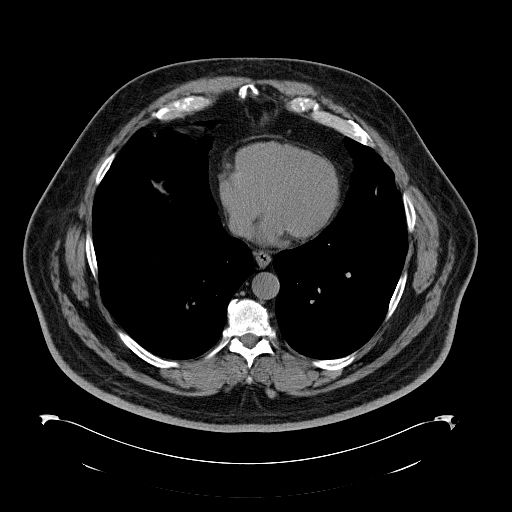
[im 25/66  lung]
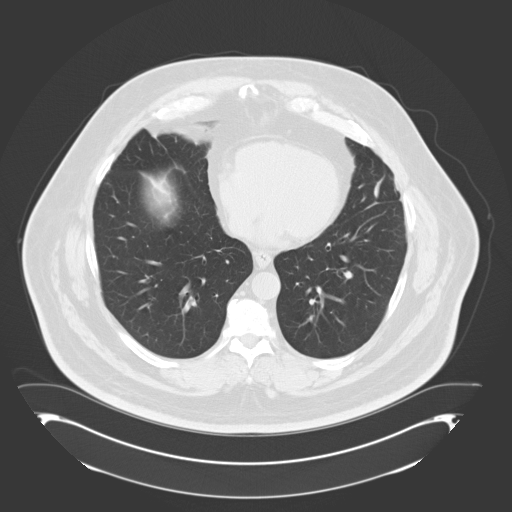
[im 29/66  lung]
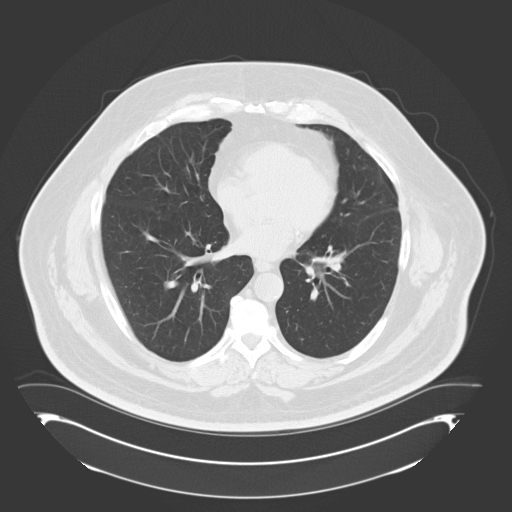
[im 37/66  lung]
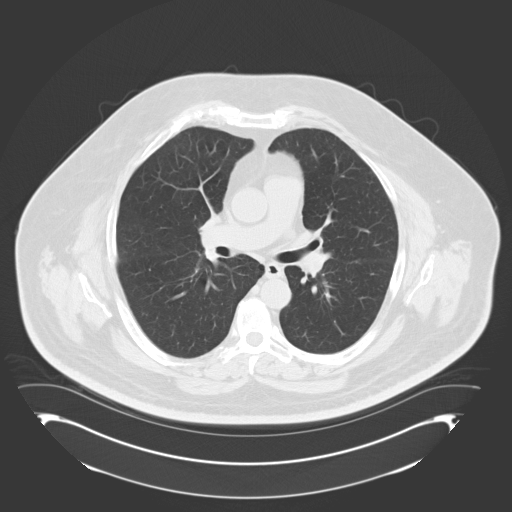
[im 41/66  lung]
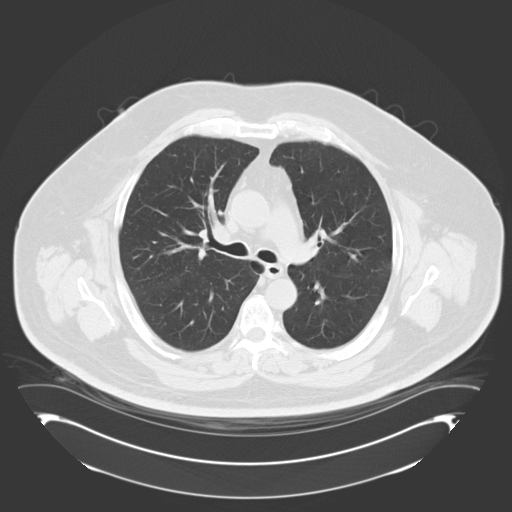
[im 46/66  mediastinal]
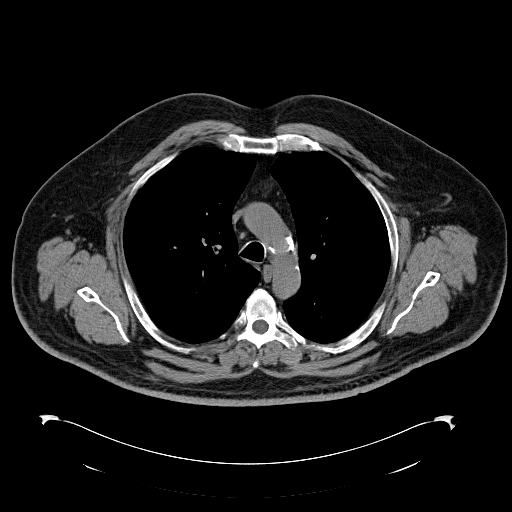
[im 46/66  lung]
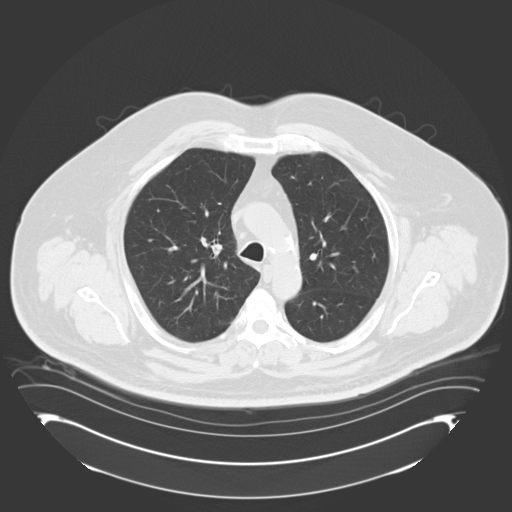
[im 51/66  lung]
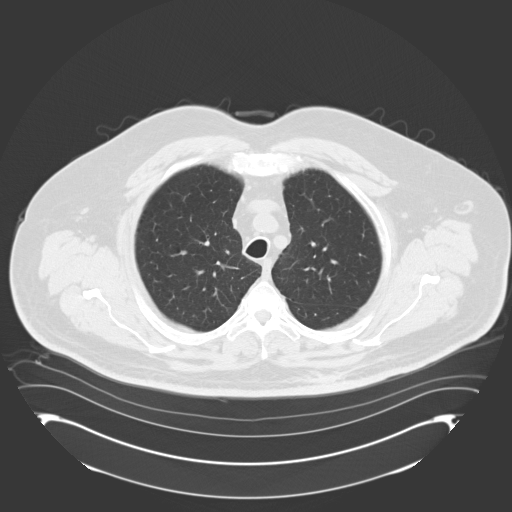
[im 56/66  lung]
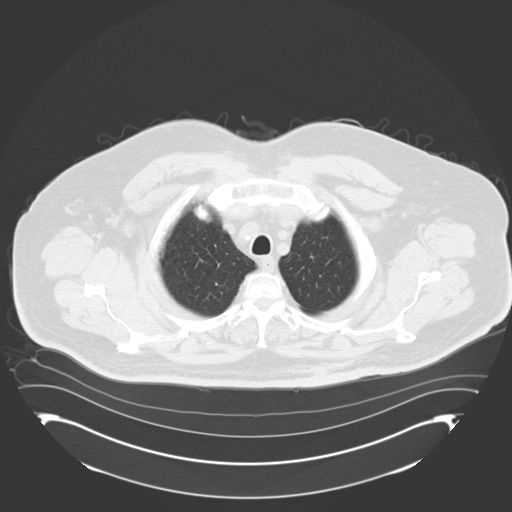
[im 61/66  lung]
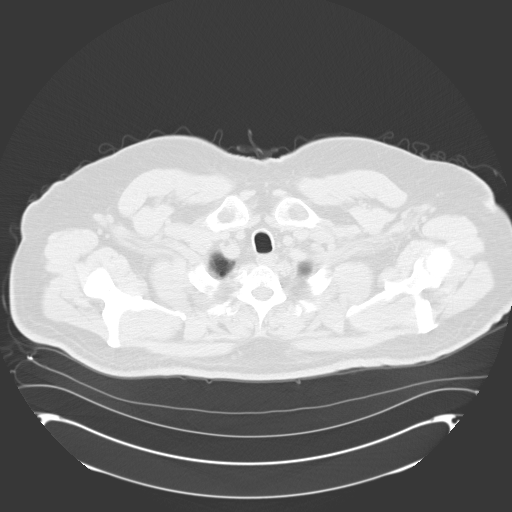

[Series 602: coronals · coronal · 0.94mm/px · 3 of 147 slices shown]
[im 30/147  lung]
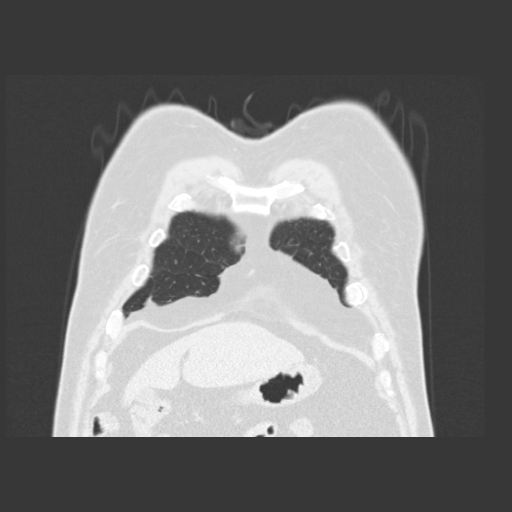
[im 59/147  lung]
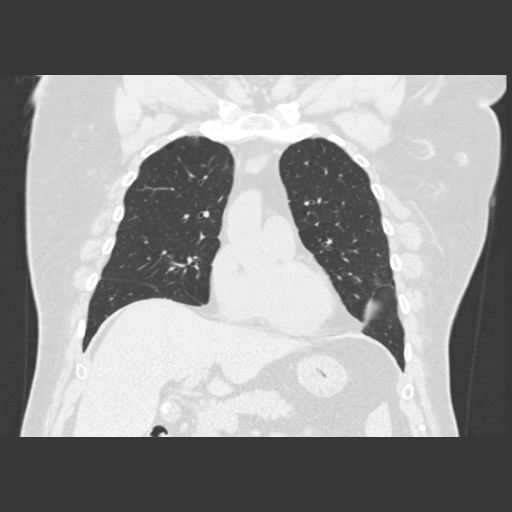
[im 88/147  lung]
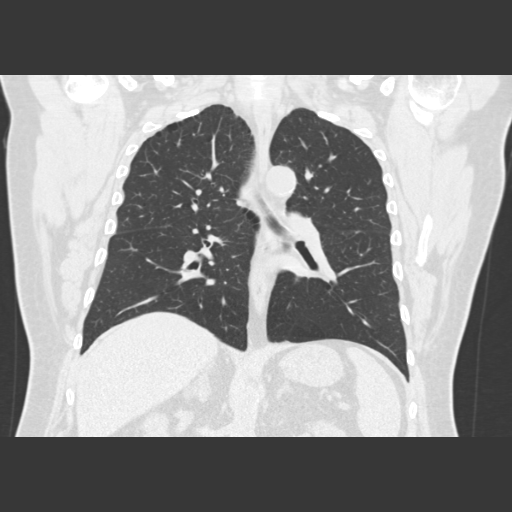

[15 of 36 positions shown; findings below may reference images not displayed]

FINDINGS: Mild apical predominant centrilobular emphysematous change.

No new pulmonary nodules.  Punctate granuloma within the right
lower lobe left lower lobe (image 36, series four) is unchanged.
Fissural nodules adjacent to the left major fissure are unchanged
with dominant nodule measuring approximate 5 mm in diameter (image
31, series four).  There is been apparent interval resolution of
previously noted dominant (approximately 9 mm) nodule within the
inferior segment of the lingula - rather, there is minimal
subsegmental atelectasis at this location.

No focal airspace opacity.  No pleural effusion or pneumothorax.
The central pulmonary airways are patent.  Shoddy mediastinal lymph
nodes are not enlarged by CT criteria.  No mediastinal, hilar or
axillary lymphadenopathy on this noncontrast examination.

Noncontrast evaluation of the upper abdomen demonstrates
cholelithiasis.  Punctate calcification within the right lobe of
the liver (image 56, series 3) is favored to be the sequelae of
prior granulomatous infection.

Normal heart size. No pericardial effusion.  Coronary artery
calcifications.  Minimal atherosclerotic plaque primarily involving
the undersurface of the aortic arch.

No acute or aggressive osseous abnormalities.
IMPRESSION: 1.  No new pulmonary nodules.

2.  Interval resolution of previously noted dominant nodule within
the inferior segment of the lingula.  Residual nodules adjacent to
the left major fissure are unchanged with dominant nodule measuring
5 mm in diameter.  This examination documents 4 months of
stability.  Given risk factors for bronchogenic carcinoma, follow-
up chest CT at 6 - 12 months is recommended.  This recommendation
follows the consensus statement: Guidelines for Management of Small
Pulmonary Nodules Detected on CT Scans: A Statement from the

3.  Mild centrilobular emphysematous change.

4.  Cholelithiasis.

## 2014-07-09 ENCOUNTER — Other Ambulatory Visit: Payer: Self-pay | Admitting: Internal Medicine

## 2014-07-16 ENCOUNTER — Encounter: Payer: Self-pay | Admitting: Internal Medicine

## 2014-07-16 ENCOUNTER — Ambulatory Visit (INDEPENDENT_AMBULATORY_CARE_PROVIDER_SITE_OTHER): Payer: Medicare Other | Admitting: Internal Medicine

## 2014-07-16 ENCOUNTER — Other Ambulatory Visit (INDEPENDENT_AMBULATORY_CARE_PROVIDER_SITE_OTHER): Payer: Medicare Other

## 2014-07-16 VITALS — BP 130/70 | HR 99 | Temp 98.3°F | Ht 68.5 in | Wt 253.8 lb

## 2014-07-16 DIAGNOSIS — E785 Hyperlipidemia, unspecified: Secondary | ICD-10-CM

## 2014-07-16 DIAGNOSIS — E1165 Type 2 diabetes mellitus with hyperglycemia: Secondary | ICD-10-CM

## 2014-07-16 DIAGNOSIS — IMO0001 Reserved for inherently not codable concepts without codable children: Secondary | ICD-10-CM

## 2014-07-16 DIAGNOSIS — Z0000 Encounter for general adult medical examination without abnormal findings: Secondary | ICD-10-CM

## 2014-07-16 LAB — LIPID PANEL
CHOLESTEROL: 133 mg/dL (ref 0–200)
HDL: 33.5 mg/dL — ABNORMAL LOW (ref >39.00)
LDL Cholesterol: 72 mg/dL (ref 0–99)
NonHDL: 99.5
Total CHOL/HDL Ratio: 4
Triglycerides: 138 mg/dL (ref 0.0–149.0)
VLDL: 27.6 mg/dL (ref 0.0–40.0)

## 2014-07-16 LAB — HEPATIC FUNCTION PANEL
ALT: 18 U/L (ref 0–53)
AST: 23 U/L (ref 0–37)
Albumin: 4.2 g/dL (ref 3.5–5.2)
Alkaline Phosphatase: 90 U/L (ref 39–117)
Bilirubin, Direct: 0.2 mg/dL (ref 0.0–0.3)
TOTAL PROTEIN: 7.2 g/dL (ref 6.0–8.3)
Total Bilirubin: 0.8 mg/dL (ref 0.2–1.2)

## 2014-07-16 LAB — TSH: TSH: 1.26 u[IU]/mL (ref 0.35–4.50)

## 2014-07-16 NOTE — Progress Notes (Signed)
   Subjective:    Patient ID: Karl Lawson, male    DOB: Sep 23, 1946, 68 y.o.   MRN: [id number]  HPI UHC/Medicare Wellness Visit: Psychosocial and medical history were reviewed as required by Medicare (history related to abuse, antisocial behavior , firearm risk). Social history: Caffeine: 1-2 cups/day & 1-2 diet colas/ day , Alcohol: occasionally, Tobacco TKW:IOXB 2014 Exercise: bike 1-2 X/week for 15 min Personal safety/fall risk:no Limitations of activities of daily living:no Seatbelt/ smoke alarm use:yes Healthcare Power of Attorney/Living Will status: not UTD Ophthalmologic exam status:due  Hearing evaluation status:not UTD Orientation: Oriented X 3 Memory and recall: good Math testing: good Depression/anxiety assessment: no Foreign travel history: 2005 [place] Immunization status for influenza/pneumonia/ shingles /tetanus:Shingles , flu, PNA never taken. PMH of CAP. Risks/benefits  discussed Transfusion history:no Preventive health care maintenance status: Colonoscopy as per protocol/standard care:UTD (11/19/12) Dental care: dentures Chart reviewed and updated. Active issues reviewed and addressed as documented below.  He has been compliant with his statin/ezetimibe agent. He states he is on modified heart healthy diet. Exercise as noted above.  There are no current lipid values chart; the last values were done in 2011. No Cardiology F/U.  His diabetes and renal function have been monitored by Dr. Chalmers Cater. A1c was < 8% in May.  Lightheadedness when he arises from a squatting or kneeling while working occasionally. Polyuria on Invokana Occasional feet numbness. Slow healing L shin lesion treated by Derm with topical antibiotics.    Review of Systems   All below NEGATIVE: Chest pain, palpitations       Dyspnea Edema Claudication  Weight change Polyphagia/dipsia    Blurred vision /diplopia/lossof vision Abd pain, bowel changes   Myalgias       Objective:   Physical Exam   Positive or pertinent physical findings include: Pattern alopecia is present. He has a beard and mustache. Complete dentures present. Nasal septum deviated to the right. Ptosis greater on the left and right. Arcus senilis present. There is decreased range of motion of the cervical spine. Faint rales are noted diffusely without increased work of breathing or other abnormal breath sounds.(Note: Chest x-ray 04/21/14 revealed COPD with no active lung disease). Abdomen is massive; no organomegaly or tenderness present. Deep tendon reflexes 1/2+ at the knees. He has crepitus of the knees without associated effusion. Dorsalis pedis pulses are decreased. There is scarring of the left shin without active cellulitis. Affect is markedly flat.    General appearance :adequately nourished; in no distress. Eyes: No conjunctival inflammation or scleral icterus is present. Oral exam:  Lips and gums are healthy appearing.There is no oropharyngeal erythema or exudate noted.  Heart:  Normal rate and regular rhythm. S1 and S2 normal without gallop, murmur, click, rub or other extra sounds  Abdomen: bowel sounds normal, soft and non-tender without  guarding or rebound.  Skin:Warm & dry.  Intact without suspicious lesions or rashes ; no jaundice or tenting Lymphatic: No lymphadenopathy is noted about the head, neck, axilla GU as per Urology.           Assessment & Plan:  See Current Assessment & Plan in Problem List under specific DiagnosisThe labs will be reviewed and risks and options assessed. Written recommendations will be provided by mail or directly through My Chart.Further evaluation or change in medical therapy will be directed by those results.

## 2014-07-16 NOTE — Assessment & Plan Note (Signed)
Lipids, LFTs, TSH  

## 2014-07-16 NOTE — Progress Notes (Signed)
Pre visit review using our clinic review tool, if applicable. No additional management support is needed unless otherwise documented below in the visit note. 

## 2014-07-16 NOTE — Patient Instructions (Signed)
Your next office appointment will be determined based upon review of your pending labs. Those instructions will be transmitted to you through My Chart . 

## 2014-07-16 NOTE — Assessment & Plan Note (Signed)
A1c < 8% in May

## 2014-10-06 ENCOUNTER — Other Ambulatory Visit: Payer: Self-pay | Admitting: Internal Medicine

## 2014-12-03 DIAGNOSIS — R972 Elevated prostate specific antigen [PSA]: Secondary | ICD-10-CM | POA: Diagnosis not present

## 2014-12-03 DIAGNOSIS — N400 Enlarged prostate without lower urinary tract symptoms: Secondary | ICD-10-CM | POA: Diagnosis not present

## 2014-12-03 DIAGNOSIS — N471 Phimosis: Secondary | ICD-10-CM | POA: Diagnosis not present

## 2014-12-03 DIAGNOSIS — N5201 Erectile dysfunction due to arterial insufficiency: Secondary | ICD-10-CM | POA: Diagnosis not present

## 2015-01-19 ENCOUNTER — Other Ambulatory Visit: Payer: Self-pay | Admitting: Internal Medicine

## 2015-04-14 DIAGNOSIS — I10 Essential (primary) hypertension: Secondary | ICD-10-CM | POA: Diagnosis not present

## 2015-04-14 DIAGNOSIS — E780 Pure hypercholesterolemia: Secondary | ICD-10-CM | POA: Diagnosis not present

## 2015-04-14 DIAGNOSIS — E1165 Type 2 diabetes mellitus with hyperglycemia: Secondary | ICD-10-CM | POA: Diagnosis not present

## 2015-04-16 ENCOUNTER — Encounter: Payer: Self-pay | Admitting: Internal Medicine

## 2015-04-16 ENCOUNTER — Other Ambulatory Visit (INDEPENDENT_AMBULATORY_CARE_PROVIDER_SITE_OTHER): Payer: Medicare Other

## 2015-04-16 ENCOUNTER — Ambulatory Visit (INDEPENDENT_AMBULATORY_CARE_PROVIDER_SITE_OTHER): Payer: Medicare Other | Admitting: Internal Medicine

## 2015-04-16 VITALS — BP 132/68 | HR 77 | Temp 97.6°F | Resp 18 | Ht 68.5 in | Wt 255.0 lb

## 2015-04-16 DIAGNOSIS — E1165 Type 2 diabetes mellitus with hyperglycemia: Secondary | ICD-10-CM | POA: Diagnosis not present

## 2015-04-16 DIAGNOSIS — I10 Essential (primary) hypertension: Secondary | ICD-10-CM

## 2015-04-16 DIAGNOSIS — IMO0002 Reserved for concepts with insufficient information to code with codable children: Secondary | ICD-10-CM

## 2015-04-16 DIAGNOSIS — Z86010 Personal history of colonic polyps: Secondary | ICD-10-CM

## 2015-04-16 DIAGNOSIS — E785 Hyperlipidemia, unspecified: Secondary | ICD-10-CM

## 2015-04-16 DIAGNOSIS — Z0000 Encounter for general adult medical examination without abnormal findings: Secondary | ICD-10-CM | POA: Diagnosis not present

## 2015-04-16 DIAGNOSIS — Z23 Encounter for immunization: Secondary | ICD-10-CM | POA: Diagnosis not present

## 2015-04-16 LAB — CBC WITH DIFFERENTIAL/PLATELET
BASOS ABS: 0 10*3/uL (ref 0.0–0.1)
Basophils Relative: 0.3 % (ref 0.0–3.0)
EOS ABS: 0.2 10*3/uL (ref 0.0–0.7)
Eosinophils Relative: 2.4 % (ref 0.0–5.0)
HEMATOCRIT: 46.9 % (ref 39.0–52.0)
HEMOGLOBIN: 15.6 g/dL (ref 13.0–17.0)
Lymphocytes Relative: 20 % (ref 12.0–46.0)
Lymphs Abs: 1.9 10*3/uL (ref 0.7–4.0)
MCHC: 33.2 g/dL (ref 30.0–36.0)
MCV: 83.4 fl (ref 78.0–100.0)
MONO ABS: 0.9 10*3/uL (ref 0.1–1.0)
Monocytes Relative: 9.2 % (ref 3.0–12.0)
NEUTROS ABS: 6.6 10*3/uL (ref 1.4–7.7)
Neutrophils Relative %: 68.1 % (ref 43.0–77.0)
PLATELETS: 248 10*3/uL (ref 150.0–400.0)
RBC: 5.63 Mil/uL (ref 4.22–5.81)
RDW: 14.7 % (ref 11.5–15.5)
WBC: 9.8 10*3/uL (ref 4.0–10.5)

## 2015-04-16 LAB — BASIC METABOLIC PANEL
BUN: 22 mg/dL (ref 6–23)
CO2: 26 meq/L (ref 19–32)
Calcium: 10.4 mg/dL (ref 8.4–10.5)
Chloride: 100 mEq/L (ref 96–112)
Creatinine, Ser: 1.3 mg/dL (ref 0.40–1.50)
GFR: 58.08 mL/min — AB (ref >60.00)
GLUCOSE: 110 mg/dL — AB (ref 70–99)
POTASSIUM: 3.8 meq/L (ref 3.5–5.1)
Sodium: 135 mEq/L (ref 135–145)

## 2015-04-16 LAB — LIPID PANEL
Cholesterol: 109 mg/dL (ref 0–200)
HDL: 37.1 mg/dL — ABNORMAL LOW (ref >39.00)
LDL CALC: 51 mg/dL (ref 0–99)
NONHDL: 71.9
TRIGLYCERIDES: 107 mg/dL (ref 0.0–149.0)
Total CHOL/HDL Ratio: 3
VLDL: 21.4 mg/dL (ref 0.0–40.0)

## 2015-04-16 LAB — HEPATIC FUNCTION PANEL
ALT: 15 U/L (ref 0–53)
AST: 15 U/L (ref 0–37)
Albumin: 4.5 g/dL (ref 3.5–5.2)
Alkaline Phosphatase: 101 U/L (ref 39–117)
Bilirubin, Direct: 0.2 mg/dL (ref 0.0–0.3)
Total Bilirubin: 1 mg/dL (ref 0.2–1.2)
Total Protein: 7.3 g/dL (ref 6.0–8.3)

## 2015-04-16 LAB — TSH: TSH: 1.31 u[IU]/mL (ref 0.35–4.50)

## 2015-04-16 LAB — MICROALBUMIN / CREATININE URINE RATIO
Creatinine,U: 113.6 mg/dL
MICROALB/CREAT RATIO: 0.6 mg/g (ref 0.0–30.0)
Microalb, Ur: <0.7 mg/dL (ref 0.0–1.9)

## 2015-04-16 NOTE — Patient Instructions (Signed)
  Your next office appointment will be determined based upon review of your pending labs .  Those written interpretation of the lab results and instructions will be transmitted to you by My Chart   Critical results will be called.   Followup as needed for any active or acute issue. Please report any significant change in your symptoms.  Colonoscopy due 2019.

## 2015-04-16 NOTE — Progress Notes (Signed)
Pre visit review using our clinic review tool, if applicable. No additional management support is needed unless otherwise documented below in the visit note. 

## 2015-04-16 NOTE — Progress Notes (Signed)
Subjective:    Patient ID: Karl Lawson., male    DOB: 04-12-1946, 69 y.o.   MRN: [id number]  HPI Medicare Wellness Visit: Psychosocial and medical history were reviewed as required by Medicare (history related to abuse, antisocial behavior , firearm risk). Social history: Caffeine: 2 cups Alcohol:occasionally Tobacco use: [phone], up to 1 ppd. Cigars 2-3/ day  1999-2013 Exercise:none Personal safety/fall risk:no Limitations of activities of daily living:no Seatbelt/ smoke alarm use:yes Healthcare Power of Attorney/Living Will status and End of Life process assessment : needs review Ophthalmologic exam status: due Hearing evaluation status:not UTD Orientation: Oriented X 3 Memory and recall: good Math testing: excellent Depression/anxiety assessment: denied Foreign travel history:[place] 2005 Immunization status for influenza/pneumonia/ shingles /tetanus:Shingles shot needed Transfusion history:no Preventive health care maintenance status: Colonoscopy as per protocol/standard care:polypectomy 2014, Dr Carlean Purl Dental care:dentures Chart reviewed and updated. Active issues reviewed and addressed as documented below.   He is not on heart healthy diet; he does restricts salt. He does not exercise. He denies any cardiopulmonary symptoms.  He has been compliant with his medicines without adverse effects. He is not monitoring his blood pressure.  He has polyuria and some postural dizziness which he attributes to the Invokana prescribed by Dr. Chalmers Cater. His A1c was 7.8% on 04/14/15.  Colonoscopy is due 2019. He has no active GI symptoms  His father had a stroke in his 52s & congestive heart failure. There is no family history of heart attack or colon cancer. A brother had diabetes    Review of Systems Chest pain, palpitations, tachycardia, exertional dyspnea, paroxysmal nocturnal dyspnea, claudication or edema are absent. No unexplained weight loss, abdominal pain, significant  dyspepsia, dysphagia, melena, rectal bleeding, or persistently small caliber stools. Dysuria, pyuria, hematuria, frequency,or nocturia are denied. Change in hair, skin, nails denied. No bowel changes of constipation or diarrhea. No intolerance to heat or cold.    Objective:   Physical Exam Gen.: Adequately nourished in appearance. Alert, appropriate and cooperative throughout exam. BMI: 38.2 Appears younger than stated age  Head: Normocephalic without obvious abnormalities;  Beard & moustache.  Eyes: No corneal or conjunctival inflammation noted. Pupils equal round reactive to light and accommodation. Extraocular motion intact. Pterygia bilaterally.Ptosis bilaterally. Ears: External  ear exam reveals no significant lesions or deformities. Canals clear .TMs normal. Hearing is grossly normal bilaterally. Nose: External nasal exam reveals no deformity or inflammation. Nasal mucosa are pink and moist. No lesions or exudates noted.   Mouth: Oral mucosa and oropharynx reveal no lesions or exudates. Complete dentures. Neck: No deformities, masses, or tenderness noted. Range of motion & Thyroid normal. Chest/Lungs: Gynecomastia.Normal respiratory effort; chest expands symmetrically. Lungs are clear to auscultation without rales, wheezes, or increased work of breathing. Heart: Normal rate and rhythm. Normal S1 and S2. No gallop, click, or rub. No murmur. Abdomen: Very protuberant.Bowel sounds normal; abdomen soft and nontender. No masses, organomegaly or hernias noted. Genitalia: as per Dr Diona Fanti                                  Musculoskeletal/extremities: No deformity or scoliosis noted of  the thoracic or lumbar spine.  No clubbing, cyanosis, edema, or significant extremity  deformity noted.  Range of motion normal . Tone & strength normal. Hand joints normal  Deformed,pitted fingernails right hand. Crepitus of knees  Able to lie down & sit up w/o help.  Negative SLR bilaterally Vascular:  Carotid,  radial artery, dorsalis pedis and  posterior tibial pulses are full and equal. No bruits present. Neurologic: Alert and oriented x3. Deep tendon reflexes symmetrical and normal.  Gait normal        Skin: Intact without suspicious lesions or rashes. Lymph: No cervical, axillary lymphadenopathy present. Psych: Mood and affect are normal. Normally interactive                                                                                       Assessment & Plan:  See Current Assessment & Plan in Problem List under specific DiagnosisThe labs will be reviewed and risks and options assessed. Written recommendations will be provided by mail or directly through My Chart.Further evaluation or change in medical therapy will be directed by those results.

## 2015-04-18 NOTE — Assessment & Plan Note (Signed)
Urine microalbumin

## 2015-04-18 NOTE — Assessment & Plan Note (Signed)
CBC Colonoscopy 2019

## 2015-04-18 NOTE — Assessment & Plan Note (Signed)
Lipids, LFTs, TSH  

## 2015-04-18 NOTE — Assessment & Plan Note (Signed)
Blood pressure goals reviewed. BMET 

## 2015-05-03 ENCOUNTER — Other Ambulatory Visit: Payer: Self-pay | Admitting: Internal Medicine

## 2015-05-07 ENCOUNTER — Other Ambulatory Visit: Payer: Self-pay | Admitting: Internal Medicine

## 2015-05-08 ENCOUNTER — Other Ambulatory Visit: Payer: Self-pay | Admitting: Internal Medicine

## 2015-05-13 ENCOUNTER — Other Ambulatory Visit: Payer: Self-pay | Admitting: Internal Medicine

## 2015-07-15 ENCOUNTER — Ambulatory Visit (INDEPENDENT_AMBULATORY_CARE_PROVIDER_SITE_OTHER): Payer: Medicare Other | Admitting: Internal Medicine

## 2015-07-15 ENCOUNTER — Encounter: Payer: Self-pay | Admitting: Internal Medicine

## 2015-07-15 VITALS — BP 114/68 | HR 80 | Ht 68.5 in | Wt 262.2 lb

## 2015-07-15 DIAGNOSIS — K2270 Barrett's esophagus without dysplasia: Secondary | ICD-10-CM

## 2015-07-15 DIAGNOSIS — K2100 Gastro-esophageal reflux disease with esophagitis, without bleeding: Secondary | ICD-10-CM

## 2015-07-15 DIAGNOSIS — K222 Esophageal obstruction: Secondary | ICD-10-CM | POA: Diagnosis not present

## 2015-07-15 MED ORDER — OMEPRAZOLE 40 MG PO CPDR: DELAYED_RELEASE_CAPSULE | ORAL | Status: DC

## 2015-07-15 NOTE — Patient Instructions (Addendum)
You have been scheduled for an endoscopy. Please follow written instructions given to you at your visit today. If you use inhalers (even only as needed), please bring them with you on the day of your procedure.  Please hold your Diabetic medicines the AM of your procedure and only take 1/2 dose of your insulin the night before.   We have sent the following medications to your pharmacy for you to pick up at your convenience: Omeprazole   I appreciate the opportunity to care for you. Silvano Rusk, MD, [facility]

## 2015-07-15 NOTE — Progress Notes (Signed)
   Subjective:    Patient ID: Karl Lawson., male    DOB: 21-Jun-1946, 69 y.o.   MRN: [id number] CC: GERD f/u HPI Doing well - no dysphagia, sig heartburn Last EGD w/ dili 2014  Wife just had colon resection for tumor in appendix   Medications, allergies, past medical history, past surgical history, family history and social history are reviewed and updated in the EMR.  Review of Systems As above    Objective:   Physical Exam BP 114/68 mmHg  Pulse 80  Ht 5' 8.5" (1.74 m)  Wt 262 lb 3.2 oz (118.933 kg)  BMI 39.28 kg/m2 Obese NAD  Reviewed 2014 EGD, 2011 EGD, pathology results    Assessment & Plan:  Gastroesophageal reflux disease with esophagitis  Barrett's esophagus -  Esophageal stricture - hx, dilated 2014  He had Barrett's vs intestinal metaplasia of hiatal hernia in 2011. Did not see any tongues when I dilated stricture 2014. Not clear what to do but one more endoscopy to reinspect re? ? Barrett's is plan - ? If stricture obscured it 2014. Stay on PPI - its working.  The risks and benefits as well as alternatives of endoscopic procedure(s) have been discussed and reviewed. All questions answered. The patient agrees to proceed.

## 2015-08-21 DIAGNOSIS — H353131 Nonexudative age-related macular degeneration, bilateral, early dry stage: Secondary | ICD-10-CM | POA: Diagnosis not present

## 2015-08-21 DIAGNOSIS — D3131 Benign neoplasm of right choroid: Secondary | ICD-10-CM | POA: Diagnosis not present

## 2015-08-21 DIAGNOSIS — E113293 Type 2 diabetes mellitus with mild nonproliferative diabetic retinopathy without macular edema, bilateral: Secondary | ICD-10-CM | POA: Diagnosis not present

## 2015-09-02 ENCOUNTER — Other Ambulatory Visit: Payer: Self-pay | Admitting: Internal Medicine

## 2015-09-25 ENCOUNTER — Encounter: Payer: Self-pay | Admitting: Internal Medicine

## 2015-09-25 DIAGNOSIS — Z794 Long term (current) use of insulin: Secondary | ICD-10-CM | POA: Diagnosis not present

## 2015-09-25 DIAGNOSIS — E113293 Type 2 diabetes mellitus with mild nonproliferative diabetic retinopathy without macular edema, bilateral: Secondary | ICD-10-CM | POA: Diagnosis not present

## 2015-09-25 DIAGNOSIS — H11042 Peripheral pterygium, stationary, left eye: Secondary | ICD-10-CM | POA: Diagnosis not present

## 2015-09-25 DIAGNOSIS — H2513 Age-related nuclear cataract, bilateral: Secondary | ICD-10-CM | POA: Diagnosis not present

## 2015-09-25 DIAGNOSIS — H25013 Cortical age-related cataract, bilateral: Secondary | ICD-10-CM | POA: Diagnosis not present

## 2015-10-07 ENCOUNTER — Ambulatory Visit (AMBULATORY_SURGERY_CENTER): Payer: Medicare Other | Admitting: Internal Medicine

## 2015-10-07 ENCOUNTER — Encounter: Payer: Self-pay | Admitting: Internal Medicine

## 2015-10-07 VITALS — BP 119/59 | HR 66 | Temp 96.8°F | Resp 23 | Ht 68.0 in | Wt 262.0 lb

## 2015-10-07 DIAGNOSIS — K219 Gastro-esophageal reflux disease without esophagitis: Secondary | ICD-10-CM | POA: Diagnosis present

## 2015-10-07 DIAGNOSIS — K2270 Barrett's esophagus without dysplasia: Secondary | ICD-10-CM

## 2015-10-07 LAB — GLUCOSE, CAPILLARY
GLUCOSE-CAPILLARY: 125 mg/dL — AB (ref 65–99)
GLUCOSE-CAPILLARY: 116 mg/dL — AB (ref 65–99)

## 2015-10-07 MED ORDER — SODIUM CHLORIDE 0.9 % IV SOLN: 500.0000 mL | INTRAVENOUS | Status: DC

## 2015-10-07 NOTE — Progress Notes (Signed)
Report to PACU, RN, vss, BBS= Clear.  

## 2015-10-07 NOTE — Op Note (Signed)
[place]  [address]. [place], [id number]   ENDOSCOPY PROCEDURE REPORT  PATIENT: Karl, Lawson  MR#: [id number] BIRTHDATE: 1946/01/16 , 24  yrs. old GENDER: male ENDOSCOPIST: Gatha Mayer, MD, [facility] PROCEDURE DATE:  10/07/2015 PROCEDURE:  Esophagoscopy ASA CLASS:     Class III INDICATIONS:  ? Barrett's - hx of intestinal metaplasia in GE junction region. MEDICATIONS: Propofol 140 mg IV TOPICAL ANESTHETIC: none  DESCRIPTION OF PROCEDURE: After the risks benefits and alternatives of the procedure were thoroughly explained, informed consent was obtained.  The [id number] endoscope was introduced through the mouth and advanced to the stomach antrum , Without limitations.  The instrument was slowly withdrawn as the mucosa was fully examined.    Slightly irregular Z-line but no columnar tongues.  Normal stomach. Retroflexed views revealed no abnormalities.     The scope was then withdrawn from the patient and the procedure completed.  COMPLICATIONS: There were no immediate complications.  ENDOSCOPIC IMPRESSION: Slightly irregular Z-line but no columnar tongues.  Normal stomach   RECOMMENDATIONS: See me as needed, stay on PPI for GERD and hx stricture Colonoscopy first half 2017 - will send letter - hx polyps   eSigned:  Gatha Mayer, MD, [facility] 10/07/2015 8:16 AM    CC: The Patient

## 2015-10-07 NOTE — Patient Instructions (Addendum)
You do not have Barrett's esophagus.  Try Pepcid Complete before bedtime on [place] food nights.  I will see you as needed.  I appreciate the opportunity to care for you. Gatha Mayer, MD, [facility]  Colonoscopy in 2017.   YOU HAD AN ENDOSCOPIC PROCEDURE TODAY AT [facility]:   Refer to the procedure report that was given to you for any specific questions about what was found during the examination.  If the procedure report does not answer your questions, please call your gastroenterologist to clarify.  If you requested that your care partner not be given the details of your procedure findings, then the procedure report has been included in a sealed envelope for you to review at your convenience later.  YOU SHOULD EXPECT: Some feelings of bloating in the abdomen. Passage of more gas than usual.  Walking can help get rid of the air that was put into your GI tract during the procedure and reduce the bloating. If you had a lower endoscopy (such as a colonoscopy or flexible sigmoidoscopy) you may notice spotting of blood in your stool or on the toilet paper. If you underwent a bowel prep for your procedure, you may not have a normal bowel movement for a few days.  Please Note:  You might notice some irritation and congestion in your nose or some drainage.  This is from the oxygen used during your procedure.  There is no need for concern and it should clear up in a day or so.  SYMPTOMS TO REPORT IMMEDIATELY:    Following upper endoscopy (EGD)  Vomiting of blood or coffee ground material  New chest pain or pain under the shoulder blades  Painful or persistently difficult swallowing  New shortness of breath  Fever of 100F or higher  Black, tarry-looking stools  For urgent or emergent issues, a gastroenterologist can be reached at any hour by calling [phone].   DIET: Your first meal following the procedure should be a small meal and then it is ok to progress to  your normal diet. Heavy or fried foods are harder to digest and may make you feel nauseous or bloated.  Likewise, meals heavy in dairy and vegetables can increase bloating.  Drink plenty of fluids but you should avoid alcoholic beverages for 24 hours.  ACTIVITY:  You should plan to take it easy for the rest of today and you should NOT DRIVE or use heavy machinery until tomorrow (because of the sedation medicines used during the test).    FOLLOW UP: Our staff will call the number listed on your records the next business day following your procedure to check on you and address any questions or concerns that you may have regarding the information given to you following your procedure. If we do not reach you, we will leave a message.  However, if you are feeling well and you are not experiencing any problems, there is no need to return our call.  We will assume that you have returned to your regular daily activities without incident.  If any biopsies were taken you will be contacted by phone or by letter within the next 1-3 weeks.  Please call us at [phone] if you have not heard about the biopsies in 3 weeks.    SIGNATURES/CONFIDENTIALITY: You and/or your care partner have signed paperwork which will be entered into your electronic medical record.  These signatures attest to the fact that that the information above on your After  Visit Summary has been reviewed and is understood.  Full responsibility of the confidentiality of this discharge information lies with you and/or your care-partner. 

## 2015-10-08 ENCOUNTER — Telehealth: Payer: Self-pay

## 2015-10-08 NOTE — Telephone Encounter (Signed)
  Follow up Call-  Call back number 10/07/2015  Post procedure Call Back phone  # [phone] - cell  Permission to leave phone message Yes     Patient questions:  Do you have a fever, pain , or abdominal swelling? No. Pain Score  0 *  Have you tolerated food without any problems? Yes.    Have you been able to return to your normal activities? Yes.    Do you have any questions about your discharge instructions: Diet   No. Medications  No. Follow up visit  No.  Do you have questions or concerns about your Care? No.  Actions: * If pain score is 4 or above: No action needed, pain <4.

## 2015-10-21 DIAGNOSIS — I10 Essential (primary) hypertension: Secondary | ICD-10-CM | POA: Diagnosis not present

## 2015-10-21 DIAGNOSIS — E1165 Type 2 diabetes mellitus with hyperglycemia: Secondary | ICD-10-CM | POA: Diagnosis not present

## 2015-10-21 DIAGNOSIS — E7800 Pure hypercholesterolemia, unspecified: Secondary | ICD-10-CM | POA: Diagnosis not present

## 2015-12-07 ENCOUNTER — Encounter: Payer: Self-pay | Admitting: Internal Medicine

## 2015-12-10 LAB — HM DIABETES EYE EXAM

## 2015-12-15 LAB — HM DIABETES EYE EXAM

## 2015-12-25 LAB — HM DIABETES EYE EXAM

## 2016-01-13 LAB — HM DIABETES EYE EXAM

## 2016-01-27 DIAGNOSIS — H18452 Nodular corneal degeneration, left eye: Secondary | ICD-10-CM | POA: Insufficient documentation

## 2016-07-18 LAB — HM DIABETES EYE EXAM

## 2016-09-13 ENCOUNTER — Other Ambulatory Visit: Payer: Self-pay | Admitting: Internal Medicine

## 2016-09-13 ENCOUNTER — Other Ambulatory Visit: Payer: Self-pay

## 2016-09-13 MED ORDER — OMEPRAZOLE 40 MG PO CPDR: DELAYED_RELEASE_CAPSULE | ORAL | 0 refills | Status: DC

## 2016-09-13 NOTE — Telephone Encounter (Signed)
Mail order refill didn't go thru earlier so re-sent the omeprazole to the [facility].

## 2016-11-01 ENCOUNTER — Other Ambulatory Visit: Payer: Self-pay | Admitting: Internal Medicine

## 2016-11-29 ENCOUNTER — Encounter: Payer: Self-pay | Admitting: Internal Medicine

## 2016-12-15 ENCOUNTER — Encounter: Payer: Self-pay | Admitting: Family

## 2016-12-15 ENCOUNTER — Ambulatory Visit (INDEPENDENT_AMBULATORY_CARE_PROVIDER_SITE_OTHER): Payer: Medicare Other | Admitting: Family

## 2016-12-15 DIAGNOSIS — J410 Simple chronic bronchitis: Secondary | ICD-10-CM | POA: Diagnosis not present

## 2016-12-15 DIAGNOSIS — J449 Chronic obstructive pulmonary disease, unspecified: Secondary | ICD-10-CM | POA: Insufficient documentation

## 2016-12-15 DIAGNOSIS — L918 Other hypertrophic disorders of the skin: Secondary | ICD-10-CM | POA: Diagnosis not present

## 2016-12-15 MED ORDER — UMECLIDINIUM-VILANTEROL 62.5-25 MCG/INH IN AEPB: 1.0000 | INHALATION_SPRAY | Freq: Every day | RESPIRATORY_TRACT | 1 refills | Status: DC

## 2016-12-15 MED ORDER — ALBUTEROL SULFATE HFA 108 (90 BASE) MCG/ACT IN AERS: 2.0000 | INHALATION_SPRAY | Freq: Four times a day (QID) | RESPIRATORY_TRACT | 0 refills | Status: DC | PRN

## 2016-12-15 NOTE — Patient Instructions (Signed)
Thank you for choosing [facility].  SUMMARY AND INSTRUCTIONS:  Start Anoro 1 puff daily.  Albuterol as needed.  They will call to schedule your pulmonary function test and dermatology.  Medication:  Your prescription(s) have been submitted to your pharmacy or been printed and provided for you. Please take as directed and contact our office if you believe you are having problem(s) with the medication(s) or have any questions.   Follow up:  If your symptoms worsen or fail to improve, please contact our office for further instruction, or in case of emergency go directly to the emergency room at the closest medical facility.    Chronic Obstructive Pulmonary Disease Chronic obstructive pulmonary disease (COPD) is a common lung problem. In COPD, the flow of air from the lungs is limited. The way your lungs work will probably never return to normal, but there are things you can do to improve your lungs and make yourself feel better. Your doctor may treat your condition with:  Medicines.  Oxygen.  Lung surgery.  Changes to your diet.  Rehabilitation. This may involve a team of specialists. Follow these instructions at home:  Take all medicines as told by your doctor.  Avoid medicines or cough syrups that dry up your airway (such as antihistamines) and do not allow you to get rid of thick spit. You do not need to avoid them if told differently by your doctor.  If you smoke, stop. Smoking makes the problem worse.  Avoid being around things that make your breathing worse (like smoke, chemicals, and fumes).  Use oxygen therapy and therapy to help improve your lungs (pulmonary rehabilitation) if told by your doctor. If you need home oxygen therapy, ask your doctor if you should buy a tool to measure your oxygen level (oximeter).  Avoid people who have a sickness you can catch (contagious).  Avoid going outside when it is very hot, cold, or humid.  Eat healthy foods. Eat  smaller meals more often. Rest before meals.  Stay active, but remember to also rest.  Make sure to get all the shots (vaccines) your doctor recommends. Ask your doctor if you need a pneumonia shot.  Learn and use tips on how to relax.  Learn and use tips on how to control your breathing as told by your doctor. Try: 1. Breathing in (inhaling) through your nose for 1 second. Then, pucker your lips and breath out (exhale) through your lips for 2 seconds. 2. Putting one hand on your belly (abdomen). Breathe in slowly through your nose for 1 second. Your hand on your belly should move out. Pucker your lips and breathe out slowly through your lips. Your hand on your belly should move in as you breathe out.  Learn and use controlled coughing to clear thick spit from your lungs. The steps are: 1. Lean your head a little forward. 2. Breathe in deeply. 3. Try to hold your breath for 3 seconds. 4. Keep your mouth slightly open while coughing 2 times. 5. Spit any thick spit out into a tissue. 6. Rest and do the steps again 1 or 2 times as needed. Contact a doctor if:  You cough up more thick spit than usual.  There is a change in the color or thickness of the spit.  It is harder to breathe than usual.  Your breathing is faster than usual. Get help right away if:  You have shortness of breath while resting.  You have shortness of breath that stops you  from:  Being able to talk.  Doing normal activities.  You chest hurts for longer than 5 minutes.  Your skin color is more blue than usual.  Your pulse oximeter shows that you have low oxygen for longer than 5 minutes. This information is not intended to replace advice given to you by your health care provider. Make sure you discuss any questions you have with your health care provider. Document Released: 04/11/2008 Document Revised: 03/31/2016 Document Reviewed: 06/20/2013 Elsevier Interactive Patient Education  2017 [place].  Pulmonary Function Tests Pulmonary function tests (PFTs) measure how well your lungs are working. The tests can help to identify the causes of lung problems. They can also help your health care provider select the best treatment for you. Your health care provider may order pulmonary function for any of the following reasons:  When an illness involving the lungs is suspected.  To follow changes in your lung function over time if you are known to have a chronic lung disease.  For industrial plant workers to examine the effects of being exposed to chemicals over a long period of time.  To assess lung function prior to surgery or other procedures.  For people who are smokers. Your measured lung function will be compared to the expected lung function of someone with healthy lungs who is similar to you in age, gender, size, and other factors. This is used to determine your "percent predicted" lung function, which is how your health care provider knows if your lung function is normal or abnormal. If you have had prior pulmonary function testing performed, your health care provider will also compare your current results with past tests to see if your lung function is better, worse, or staying the same. This can sometimes be useful to see if treatments are working.  LET [facility] CARE PROVIDER KNOW ABOUT:  Any allergies you have.  All medicines you are taking, including inhaler or nebulizer medicines, vitamins, herbs, eye drops, creams, and over-the-counter medicines.  Any blood disorders you have.  Previous surgeries you have had, especially recent eye surgery, abdominal surgery, or chest surgery. These can make performing pulmonary function tests difficult or unsafe.  Medical conditions you have.  Chest pain or heart problems.  Tuberculosis or respiratory infections, such as pneumonia, a cold, or the flu. If you think you will have difficulty performing any of the breathing maneuvers,  ask your health care provider if you should reschedule the test. RISKS AND COMPLICATIONS: Generally, pulmonary function testing is a safe procedure. However, as with any procedure, complications can occur. Possible complications include:  Lightheadedness due to overbreathing (hyperventilation).  An asthmatic attack from deep breathing. BEFORE THE PROCEDURE  Take medicine as directed by your health care provider. If you take inhaler or nebulizer medicines, ask your health care provider which medicines you should take on the day of your test. Some inhaler medicines may interfere with pulmonary function tests, such as bronchodilator testing, if taken shortly before the test.  Avoid eating a large meal before your test.  Do not smoke before your test.  Wear comfortable clothing which will not interfere with breathing. PROCEDURE  You will be given a soft nose clip to wear during the procedure. This is done so that all of your breaths will go through your mouth instead of your nose.  You will be given a germ-free (sterile) mouthpiece. It will be attached to a spirometer. The spirometer is the machine that measures your breathing.  You will  be instructed to perform various breathing maneuvers. The maneuvers will be done by breathing in (inhaling) and breathing out (exhaling). Depending on what measurements are ordered, you may be asked to repeat the maneuvers several times before the test is completed.  It is important to follow the instructions exactly to obtain accurate results. Make sure to blow as hard and as fast as you can when you are instructed to do so.  You may be given a bronchodilator after testing has been performed. A bronchodilator is a medicine which makes the small air passages in your lungs larger. These medicines usually make it easier to breathe. The tests are then repeated several minutes later after the bronchodilator has taken effect.  You will be monitored carefully  during the procedure for faintness, dizziness, difficulty breathing, or any other problems. AFTER THE PROCEDURE   You may resume your usual diet, medicines, and activities as directed by your health care provider.  Your health care provider will go over your test results with you and determine what treatments may be helpful. This information is not intended to replace advice given to you by your health care provider. Make sure you discuss any questions you have with your health care provider. Document Released: 06/16/2004 Document Revised: 08/14/2013 Document Reviewed: 05/23/2013 Elsevier Interactive Patient Education  2017 [employer].

## 2016-12-15 NOTE — Progress Notes (Signed)
Subjective:    Patient ID: Karl Lawson., male    DOB: Feb 11, 1946, 71 y.o.   MRN: [id number]  Chief Complaint  Patient presents with  . Establish Care    skin under right eye, deep cough left over from a cold in january, needs to see if he needs inhaler as Dr. Linna Darner rxed him one in the past for same situation    HPI:  Karl Lawson. is a 71 y.o. male who  has a past medical history of CAP (community acquired pneumonia) (1968); Cataract; Diabetes mellitus; Diverticulosis of colon (11/19/12); GERD (gastroesophageal reflux disease); Hyperlipidemia; Hypertension; and Personal history of adenomatous colonic polyps (07/17/2008). and presents today for an office visit.   1.) Cough - Previously had a cough about 1 month ago and described as going from his head to his chest and continues to have a deep bronchial type cough. Denies fevers. Modifying factors robitussin which has helped. Symptoms have generally stayed about the same since initial onset. He has been smoking e-cigarettes and using menthol seems to aggravate his symptoms. No recent antibiotics. He is currently maintained on omeprazole for his GERD. Noted to have an inhaler previously.   2.) Skin tag - Associated symptom of a skin tag located under his right eye has been going on for about 3-4 months. Unsure of any growth. Nonpainful. Denies any modifying factors that make it better or worse.    No Known Allergies    Outpatient Medications Prior to Visit  Medication Sig Dispense Refill  . amLODipine (NORVASC) 5 MG tablet Take 1 tablet by mouth daily.    Marland Kitchen aspirin EC 81 MG tablet Take 81 mg by mouth daily.    . finasteride (PROSCAR) 5 MG tablet Take 5 mg by mouth daily.    . insulin lispro protamine-lispro (HUMALOG 75/25 MIX) (75-25) 100 UNIT/ML SUSP injection Inject 40 Units into the skin 2 (two) times daily. Inject 45 units am and 40 units pm     . INVOKANA 100 MG TABS tablet Take 1 tablet by mouth.    .  losartan-hydrochlorothiazide (HYZAAR) 100-25 MG per tablet Take 1 tablet by mouth daily.    . metFORMIN (GLUCOPHAGE) 1000 MG tablet TAKE 1 TABLET TWICE A DAY WITH MEALS 60 tablet 0  . Multiple Vitamins-Minerals (MULTIVITAMIN WITH MINERALS) tablet Take 1 tablet by mouth daily.    Marland Kitchen omeprazole (PRILOSEC) 40 MG capsule TAKE 1 CAPSULE BY MOUTH  DAILY BEFORE BREAKFAST 90 capsule 0  . ezetimibe-simvastatin (VYTORIN) 10-20 MG per tablet As directed: 1/2 by mouth daily, Rx'ed by cardiologist     No facility-administered medications prior to visit.       Past Surgical History:  Procedure Laterality Date  . APPENDECTOMY  1959  . CATARACT EXTRACTION    . COLONOSCOPY  multiple   adenomatous polyps, Dr Carlean Purl  . ESOPHAGOGASTRODUODENOSCOPY  multiple  . HERNIA REPAIR  1997   left inguinal      Past Medical History:  Diagnosis Date  . CAP (community acquired pneumonia) 1968   [place] ([facility])  . Cataract   . Diabetes mellitus   . Diverticulosis of colon 11/19/12   left colon, Dr Carlean Purl  . GERD (gastroesophageal reflux disease)   . Hyperlipidemia   . Hypertension   . Personal history of adenomatous colonic polyps 07/17/2008   2004 2 polyps max 8 mm (adenomas) 2010 7 polyps TV and tubular adenomas max 6 mm 11/19/2012  Review of Systems  Constitutional: Negative for chills and fever.  HENT: Positive for congestion.   Respiratory: Positive for cough and wheezing. Negative for chest tightness and shortness of breath.   Cardiovascular: Negative for chest pain, palpitations and leg swelling.  Skin:       Positive for skin tag      Objective:    BP 128/68 (BP Location: Left Arm, Patient Position: Sitting, Cuff Size: Large)   Pulse (!) 108   Temp 97.8 F (36.6 C) (Oral)   Resp 18   Ht 5\' 8"  (1.727 m)   Wt 254 lb 1.9 oz (115.3 kg)   SpO2 93%   BMI 38.64 kg/m  Nursing note and vital signs reviewed.  Physical Exam  Constitutional: He is oriented to person, place, and  time. He appears well-developed and well-nourished. No distress.  Cardiovascular: Normal rate, regular rhythm, normal heart sounds and intact distal pulses.  Exam reveals no gallop and no friction rub.   No murmur heard. Pulmonary/Chest: Effort normal. No respiratory distress. He has wheezes. He has no rales. He exhibits no tenderness.  Neurological: He is alert and oriented to person, place, and time.  Skin: Skin is warm and dry.  Small firm skin tag noted under right eye.   Psychiatric: He has a normal mood and affect. His behavior is normal. Judgment and thought content normal.       Assessment & Plan:   Problem List Items Addressed This Visit      Respiratory   COPD (chronic obstructive pulmonary disease) ([place])    Symptoms and exam are consistent with COPD given smoking history and continues to use e-cigarettes. Start Anoro. Continue current dosage of albuterol as needed for wheezing. Obtain pulmonary function test. Encouraged nicotine cessation. Continue to monitor.       Relevant Medications   albuterol (PROVENTIL HFA;VENTOLIN HFA) 108 (90 Base) MCG/ACT inhaler   umeclidinium-vilanterol (ANORO ELLIPTA) 62.5-25 MCG/INH AEPB   Other Relevant Orders   Pulmonary function test     Musculoskeletal and Integument   Skin tag    Small skin tag noted below right eye. Appears benign. Refer to dermatology for removal with concern for scar development.       Relevant Orders   Ambulatory referral to Dermatology       I have discontinued Mr. Allocco ezetimibe-simvastatin. I am also having him start on albuterol and umeclidinium-vilanterol. Additionally, I am having him maintain his losartan-hydrochlorothiazide, finasteride, metFORMIN, multivitamin with minerals, aspirin EC, insulin lispro protamine-lispro, INVOKANA, amLODipine, and omeprazole.   Meds ordered this encounter  Medications  . albuterol (PROVENTIL HFA;VENTOLIN HFA) 108 (90 Base) MCG/ACT inhaler    Sig: Inhale 2 puffs into  the lungs every 6 (six) hours as needed for wheezing or shortness of breath.    Dispense:  1 Inhaler    Refill:  0    Order Specific Question:   Supervising Provider    Answer:   Pricilla Holm A [id number]  . umeclidinium-vilanterol (ANORO ELLIPTA) 62.5-25 MCG/INH AEPB    Sig: Inhale 1 puff into the lungs daily.    Dispense:  60 each    Refill:  1    Order Specific Question:   Supervising Provider    Answer:   Pricilla Holm A [id number]     Follow-up: Return in about 1 month (around 01/12/2017), or if symptoms worsen or fail to improve.  Mauricio Po, FNP

## 2016-12-15 NOTE — Assessment & Plan Note (Signed)
Small skin tag noted below right eye. Appears benign. Refer to dermatology for removal with concern for scar development.

## 2016-12-15 NOTE — Assessment & Plan Note (Addendum)
Symptoms and exam are consistent with COPD given smoking history and continues to use e-cigarettes. Start Anoro. Continue current dosage of albuterol as needed for wheezing. Obtain pulmonary function test. Encouraged nicotine cessation. Continue to monitor.

## 2016-12-22 ENCOUNTER — Ambulatory Visit (AMBULATORY_SURGERY_CENTER): Payer: Self-pay

## 2016-12-22 ENCOUNTER — Encounter: Payer: Self-pay | Admitting: Internal Medicine

## 2016-12-22 VITALS — Ht 69.0 in | Wt 255.4 lb

## 2016-12-22 DIAGNOSIS — Z860100 Personal history of colon polyps, unspecified: Secondary | ICD-10-CM

## 2016-12-22 NOTE — Progress Notes (Signed)
No allergies to eggs or soy No past problems with anesthesia No diet meds No home oxygen  Declined emmi 

## 2017-01-05 ENCOUNTER — Encounter: Payer: Self-pay | Admitting: Internal Medicine

## 2017-01-05 ENCOUNTER — Ambulatory Visit (AMBULATORY_SURGERY_CENTER): Payer: Medicare Other | Admitting: Internal Medicine

## 2017-01-05 VITALS — BP 104/54 | HR 76 | Temp 99.3°F | Resp 25 | Ht 69.0 in | Wt 255.0 lb

## 2017-01-05 DIAGNOSIS — Z86010 Personal history of colonic polyps: Secondary | ICD-10-CM

## 2017-01-05 DIAGNOSIS — D123 Benign neoplasm of transverse colon: Secondary | ICD-10-CM

## 2017-01-05 DIAGNOSIS — D124 Benign neoplasm of descending colon: Secondary | ICD-10-CM | POA: Diagnosis not present

## 2017-01-05 DIAGNOSIS — D122 Benign neoplasm of ascending colon: Secondary | ICD-10-CM

## 2017-01-05 MED ORDER — SODIUM CHLORIDE 0.9 % IV SOLN: 500.0000 mL | INTRAVENOUS | Status: DC

## 2017-01-05 NOTE — Progress Notes (Signed)
Pt's states no medical or surgical changes since previsit or office visit. maw 

## 2017-01-05 NOTE — Progress Notes (Signed)
Called to room to assist during endoscopic procedure.  Patient ID and intended procedure confirmed with present staff. Received instructions for my participation in the procedure from the performing physician.  

## 2017-01-05 NOTE — Op Note (Signed)
[facility] Patient Name: Karl Lawson Procedure Date: 01/05/2017 8:08 AM MRN: [id number] Endoscopist: Gatha Mayer , MD Age: 71 Referring MD:  Date of Birth: Mar 17, 1946 Gender: Male Account #: [id number] Procedure:                Colonoscopy Indications:              Surveillance: Personal history of adenomatous                            polyps on last colonoscopy > 3 years ago Medicines:                Propofol per Anesthesia, Monitored Anesthesia Care Procedure:                Pre-Anesthesia Assessment:                           - Prior to the procedure, a History and Physical                            was performed, and patient medications and                            allergies were reviewed. The patient's tolerance of                            previous anesthesia was also reviewed. The risks                            and benefits of the procedure and the sedation                            options and risks were discussed with the patient.                            All questions were answered, and informed consent                            was obtained. Prior Anticoagulants: The patient has                            taken no previous anticoagulant or antiplatelet                            agents. ASA Grade Assessment: III - A patient with                            severe systemic disease. After reviewing the risks                            and benefits, the patient was deemed in                            satisfactory condition to undergo the procedure.  After obtaining informed consent, the colonoscope                            was passed under direct vision. Throughout the                            procedure, the patient's blood pressure, pulse, and                            oxygen saturations were monitored continuously. The                            Colonoscope was introduced through the anus and   advanced to the the cecum, identified by                            appendiceal orifice and ileocecal valve. The                            colonoscopy was performed without difficulty. The                            patient tolerated the procedure well. The quality                            of the bowel preparation was good. The bowel                            preparation used was Miralax. The ileocecal valve,                            appendiceal orifice, and rectum were photographed. Scope In: 8:12:43 AM Scope Out: 8:28:21 AM Scope Withdrawal Time: 0 hours 13 minutes 32 seconds  Total Procedure Duration: 0 hours 15 minutes 38 seconds  Findings:                 The perianal and digital rectal examinations were                            normal. Pertinent negatives include normal prostate                            (size, shape, and consistency).                           Eight sessile polyps were found in the descending                            colon, transverse colon and ascending colon. The                            polyps were 3 to 10 mm in size. These polyps were  removed with a cold snare. Resection and retrieval                            were complete. Verification of patient                            identification for the specimen was done. Estimated                            blood loss was minimal.                           Multiple large-mouthed diverticula were found in                            the sigmoid colon and descending colon.                           The exam was otherwise without abnormality on                            direct and retroflexion views. Complications:            No immediate complications. Estimated Blood Loss:     Estimated blood loss was minimal. Impression:               - Eight 3 to 10 mm polyps in the descending colon,                            in the transverse colon and in the ascending colon,                             removed with a cold snare. Resected and retrieved.                           - Diverticulosis in the sigmoid colon and in the                            descending colon.                           - The examination was otherwise normal on direct                            and retroflexion views.                           - Personal history of colonic polyps. Recommendation:           - Patient has a contact number available for                            emergencies. The signs and symptoms of potential                            delayed  complications were discussed with the                            patient. Return to normal activities tomorrow.                            Written discharge instructions were provided to the                            patient.                           - Resume previous diet.                           - Continue present medications.                           - Repeat colonoscopy is recommended for                            surveillance. The colonoscopy date will be                            determined after pathology results from today's                            exam become available for review. Gatha Mayer, MD 01/05/2017 8:37:23 AM This report has been signed electronically.

## 2017-01-05 NOTE — Progress Notes (Signed)
A and O x3. Report to RN. Tolerated MAC anesthesia well.

## 2017-01-05 NOTE — Patient Instructions (Addendum)
You are still growing polyps - I removed 8 small to medium polyps.  You also have a condition called diverticulosis - common and not usually a problem. Please read the handout provided.  I will let you know pathology results and when to have another routine colonoscopy by mail or My Chart.   I appreciate the opportunity to care for you. Gatha Mayer, MD, [facility]  Discharge instructions given. Handouts on polyps and diverticulosis. Resume previous medications. YOU HAD AN ENDOSCOPIC PROCEDURE TODAY AT [facility]:   Refer to the procedure report that was given to you for any specific questions about what was found during the examination.  If the procedure report does not answer your questions, please call your gastroenterologist to clarify.  If you requested that your care partner not be given the details of your procedure findings, then the procedure report has been included in a sealed envelope for you to review at your convenience later.  YOU SHOULD EXPECT: Some feelings of bloating in the abdomen. Passage of more gas than usual.  Walking can help get rid of the air that was put into your GI tract during the procedure and reduce the bloating. If you had a lower endoscopy (such as a colonoscopy or flexible sigmoidoscopy) you may notice spotting of blood in your stool or on the toilet paper. If you underwent a bowel prep for your procedure, you may not have a normal bowel movement for a few days.  Please Note:  You might notice some irritation and congestion in your nose or some drainage.  This is from the oxygen used during your procedure.  There is no need for concern and it should clear up in a day or so.  SYMPTOMS TO REPORT IMMEDIATELY:   Following lower endoscopy (colonoscopy or flexible sigmoidoscopy):  Excessive amounts of blood in the stool  Significant tenderness or worsening of abdominal pains  Swelling of the abdomen that is new, acute  Fever of 100F or  higher   For urgent or emergent issues, a gastroenterologist can be reached at any hour by calling [phone].   DIET:  We do recommend a small meal at first, but then you may proceed to your regular diet.  Drink plenty of fluids but you should avoid alcoholic beverages for 24 hours.  ACTIVITY:  You should plan to take it easy for the rest of today and you should NOT DRIVE or use heavy machinery until tomorrow (because of the sedation medicines used during the test).    FOLLOW UP: Our staff will call the number listed on your records the next business day following your procedure to check on you and address any questions or concerns that you may have regarding the information given to you following your procedure. If we do not reach you, we will leave a message.  However, if you are feeling well and you are not experiencing any problems, there is no need to return our call.  We will assume that you have returned to your regular daily activities without incident.  If any biopsies were taken you will be contacted by phone or by letter within the next 1-3 weeks.  Please call us at [phone] if you have not heard about the biopsies in 3 weeks.    SIGNATURES/CONFIDENTIALITY: You and/or your care partner have signed paperwork which will be entered into your electronic medical record.  These signatures attest to the fact that that the information above on your  After Visit Summary has been reviewed and is understood.  Full responsibility of the confidentiality of this discharge information lies with you and/or your care-partner.

## 2017-01-06 ENCOUNTER — Telehealth: Payer: Self-pay

## 2017-01-06 NOTE — Telephone Encounter (Signed)
  Follow up Call-  Call back number 01/05/2017 10/07/2015  Post procedure Call Back phone  # [phone] - hm [phone] - cell  Permission to leave phone message Yes Yes  Some recent data might be hidden     Patient questions:  Do you have a fever, pain , or abdominal swelling? No. Pain Score  0 *  Have you tolerated food without any problems? Yes.    Have you been able to return to your normal activities? Yes.    Do you have any questions about your discharge instructions: Diet   No. Medications  No. Follow up visit  No.  Do you have questions or concerns about your Care? No.  Actions: * If pain score is 4 or above:  No action needed, pain <4.   Per pt no problems. maw

## 2017-01-12 ENCOUNTER — Ambulatory Visit (INDEPENDENT_AMBULATORY_CARE_PROVIDER_SITE_OTHER): Payer: Medicare Other | Admitting: Family

## 2017-01-12 ENCOUNTER — Encounter: Payer: Self-pay | Admitting: Family

## 2017-01-12 DIAGNOSIS — J410 Simple chronic bronchitis: Secondary | ICD-10-CM

## 2017-01-12 MED ORDER — UMECLIDINIUM-VILANTEROL 62.5-25 MCG/INH IN AEPB: 1.0000 | INHALATION_SPRAY | Freq: Every day | RESPIRATORY_TRACT | 0 refills | Status: DC

## 2017-01-12 NOTE — Patient Instructions (Signed)
Thank you for choosing [facility].  SUMMARY AND INSTRUCTIONS:  Please check the price of the following medications:  Bevespi Stiolto Spiriva Atrovent  We will follow up at the completion of your breathing test.  Medication:  Your prescription(s) have been submitted to your pharmacy or been printed and provided for you. Please take as directed and contact our office if you believe you are having problem(s) with the medication(s) or have any questions.   Follow up:  If your symptoms worsen or fail to improve, please contact our office for further instruction, or in case of emergency go directly to the emergency room at the closest medical facility.

## 2017-01-12 NOTE — Assessment & Plan Note (Signed)
Symptoms and exam consistent with COPD favoring chronic bronchitis. Awaiting pulmonary function testing. Will continue Anoro and albuterol pending PFT. Stop Anoro 1 week prior to testing. Continue albuterol as needed. Follow up pending pulmonary function testing. Advised to discontinue e-cigarettes as able.

## 2017-01-12 NOTE — Progress Notes (Signed)
Subjective:    Patient ID: Karl Darting., male    DOB: 1946/02/05, 71 y.o.   MRN: [id number]  Chief Complaint  Patient presents with  . Follow-up    Bronchitis, has breathing test the 23rd of march    HPI:  Karl Lawson. is a 71 y.o. male who  has a past medical history of CAP (community acquired pneumonia) (1968); Cataract; COPD (chronic obstructive pulmonary disease) ([place]); Diabetes mellitus; Diverticulosis of colon (11/19/12); GERD (gastroesophageal reflux disease); Hyperlipidemia; Hypertension; and Personal history of adenomatous colonic polyps (07/17/2008). and presents today for a follow office visit.   1.) COPD - Recently evaluated in the office with concern for chronic bronchitis. He has a pulmonary function test scheduled for March 23rd. He was prescribed albuterol and Anoro which he reports taking as prescribed and denies adverse side effects. Notes breathing improvements since starting the medication and feels like things are broken up a little easier. He does continue to use e-cigarettes on a daily basis which he notes generally worsens his symptoms.   No Known Allergies    Outpatient Medications Prior to Visit  Medication Sig Dispense Refill  . albuterol (PROVENTIL HFA;VENTOLIN HFA) 108 (90 Base) MCG/ACT inhaler Inhale 2 puffs into the lungs every 6 (six) hours as needed for wheezing or shortness of breath. 1 Inhaler 0  . amLODipine (NORVASC) 5 MG tablet Take 1 tablet by mouth daily.    Marland Kitchen aspirin EC 81 MG tablet Take 81 mg by mouth daily.    . finasteride (PROSCAR) 5 MG tablet Take 5 mg by mouth daily.    . insulin lispro protamine-lispro (HUMALOG 75/25 MIX) (75-25) 100 UNIT/ML SUSP injection Inject 40 Units into the skin 2 (two) times daily. Inject 45 units am and 40 units pm     . INVOKANA 100 MG TABS tablet Take 1 tablet by mouth.    . losartan-hydrochlorothiazide (HYZAAR) 100-25 MG per tablet Take 1 tablet by mouth daily.    . metFORMIN (GLUCOPHAGE) 1000 MG  tablet TAKE 1 TABLET TWICE A DAY WITH MEALS 60 tablet 0  . Multiple Vitamins-Minerals (MULTIVITAMIN WITH MINERALS) tablet Take 1 tablet by mouth daily.    Marland Kitchen omeprazole (PRILOSEC) 40 MG capsule TAKE 1 CAPSULE BY MOUTH  DAILY BEFORE BREAKFAST 90 capsule 0   Facility-Administered Medications Prior to Visit  Medication Dose Route Frequency Provider Last Rate Last Dose  . 0.9 %  sodium chloride infusion  500 mL Intravenous Continuous Gatha Mayer, MD        Review of Systems  Constitutional: Negative for chills and fever.  Respiratory: Positive for cough and wheezing. Negative for chest tightness and shortness of breath.   Cardiovascular: Negative for chest pain, palpitations and leg swelling.      Objective:    BP 112/60 (BP Location: Left Arm, Patient Position: Sitting, Cuff Size: Large)   Pulse 73   Temp 97.9 F (36.6 C) (Oral)   Resp 16   Ht 5\' 9"  (1.753 m)   Wt 261 lb (118.4 kg)   SpO2 93%   BMI 38.54 kg/m  Nursing note and vital signs reviewed.  Physical Exam  Constitutional: He is oriented to person, place, and time. He appears well-developed and well-nourished. No distress.  Cardiovascular: Normal rate, regular rhythm, normal heart sounds and intact distal pulses.  Exam reveals no gallop and no friction rub.   No murmur heard. Pulmonary/Chest: Effort normal. No respiratory distress. He has wheezes. He has no rales. He  exhibits no tenderness.  Neurological: He is alert and oriented to person, place, and time.  Skin: Skin is warm and dry.  Psychiatric: He has a normal mood and affect. His behavior is normal. Judgment and thought content normal.       Assessment & Plan:   Problem List Items Addressed This Visit      Respiratory   COPD (chronic obstructive pulmonary disease) ([place])    Symptoms and exam consistent with COPD favoring chronic bronchitis. Awaiting pulmonary function testing. Will continue Anoro and albuterol pending PFT. Stop Anoro 1 week prior to testing.  Continue albuterol as needed. Follow up pending pulmonary function testing. Advised to discontinue e-cigarettes as able.       Relevant Medications   umeclidinium-vilanterol (ANORO ELLIPTA) 62.5-25 MCG/INH AEPB       I am having Mr. Chacko start on umeclidinium-vilanterol. I am also having him maintain his losartan-hydrochlorothiazide, finasteride, metFORMIN, multivitamin with minerals, aspirin EC, insulin lispro protamine-lispro, INVOKANA, amLODipine, omeprazole, and albuterol. We will continue to administer sodium chloride.   Meds ordered this encounter  Medications  . umeclidinium-vilanterol (ANORO ELLIPTA) 62.5-25 MCG/INH AEPB    Sig: Inhale 1 puff into the lungs daily.    Dispense:  7 each    Refill:  0    Order Specific Question:   Supervising Provider    Answer:   Pricilla Holm A [[id number]]     Follow-up: Return in about 2 months (around 03/14/2017), or if symptoms worsen or fail to improve.  Mauricio Po, FNP

## 2017-01-16 ENCOUNTER — Encounter: Payer: Self-pay | Admitting: Internal Medicine

## 2017-01-16 NOTE — Progress Notes (Signed)
8 adenomas Recall 3 yrs 2021 My chart letter

## 2017-01-26 ENCOUNTER — Other Ambulatory Visit: Payer: Self-pay | Admitting: Internal Medicine

## 2017-01-27 ENCOUNTER — Ambulatory Visit (INDEPENDENT_AMBULATORY_CARE_PROVIDER_SITE_OTHER): Payer: Medicare Other | Admitting: Internal Medicine

## 2017-01-27 DIAGNOSIS — J410 Simple chronic bronchitis: Secondary | ICD-10-CM

## 2017-01-27 LAB — PULMONARY FUNCTION TEST
DL/VA % PRED: 86 %
DL/VA: 3.85 ml/min/mmHg/L
DLCO UNC: 21.49 ml/min/mmHg
DLCO cor % pred: 69 %
DLCO cor: 20.72 ml/min/mmHg
DLCO unc % pred: 72 %
FEF 25-75 Post: 2.22 L/sec
FEF 25-75 Pre: 2.03 L/sec
FEF2575-%Change-Post: 9 %
FEF2575-%PRED-POST: 100 %
FEF2575-%Pred-Pre: 91 %
FEV1-%CHANGE-POST: 1 %
FEV1-%PRED-POST: 87 %
FEV1-%Pred-Pre: 86 %
FEV1-Post: 2.58 L
FEV1-Pre: 2.54 L
FEV1FVC-%Change-Post: 5 %
FEV1FVC-%Pred-Pre: 104 %
FEV6-%Change-Post: -2 %
FEV6-%PRED-POST: 83 %
FEV6-%Pred-Pre: 86 %
FEV6-POST: 3.18 L
FEV6-PRE: 3.28 L
FEV6FVC-%CHANGE-POST: 0 %
FEV6FVC-%PRED-PRE: 104 %
FEV6FVC-%Pred-Post: 105 %
FVC-%CHANGE-POST: -3 %
FVC-%PRED-POST: 79 %
FVC-%Pred-Pre: 82 %
FVC-POST: 3.22 L
FVC-Pre: 3.34 L
PRE FEV6/FVC RATIO: 98 %
Post FEV1/FVC ratio: 80 %
Post FEV6/FVC ratio: 99 %
Pre FEV1/FVC ratio: 76 %
RV % PRED: 101 %
RV: 2.41 L
TLC % pred: 87 %
TLC: 5.82 L

## 2017-01-27 NOTE — Progress Notes (Signed)
PFT done today. 

## 2017-01-29 ENCOUNTER — Encounter: Payer: Self-pay | Admitting: Family

## 2017-02-17 ENCOUNTER — Ambulatory Visit (HOSPITAL_COMMUNITY)
Admission: EM | Admit: 2017-02-17 | Discharge: 2017-02-17 | Disposition: A | Discharge status: Home / Self Care | Payer: Medicare Other | Attending: Family Medicine | Admitting: Family Medicine

## 2017-02-17 ENCOUNTER — Encounter (HOSPITAL_COMMUNITY): Payer: Self-pay | Admitting: Emergency Medicine

## 2017-02-17 DIAGNOSIS — H6592 Unspecified nonsuppurative otitis media, left ear: Secondary | ICD-10-CM | POA: Diagnosis not present

## 2017-02-17 MED ORDER — FLUTICASONE PROPIONATE 50 MCG/ACT NA SUSP: 2.0000 | Freq: Every day | NASAL | 1 refills | Status: DC

## 2017-02-17 NOTE — ED Triage Notes (Signed)
Here for ear fullness and muffled hearing onset 1 week  Denies cold sx, fevers  A&O x4... NAD

## 2017-02-17 NOTE — ED Provider Notes (Signed)
CSN: [id number]     Arrival date & time 02/17/17  1143 History   First MD Initiated Contact with Patient 02/17/17 1206     Chief Complaint  Patient presents with  . Otalgia   (Consider location/radiation/quality/duration/timing/severity/associated sxs/prior Treatment) 71 year old male presents to clinic with with hx of ear pain and pressure. Dx last week by PCP with "fluid in his ears" has been taking OTC ear drops with minimal relief    The history is provided by the patient.  Otalgia  Location:  Left Behind ear:  No abnormality Quality:  Aching and pressure Severity:  Mild Onset quality:  Gradual Duration:  2 weeks Timing:  Constant Progression:  Worsening Chronicity:  New Context: not direct blow, not foreign body in ear, not recent URI and not water in ear   Relieved by:  Nothing Worsened by:  Nothing Ineffective treatments:  OTC medications Associated symptoms: no congestion, no cough, no ear discharge, no fever, no headaches, no hearing loss, no rash, no rhinorrhea, no sore throat and no vomiting     Past Medical History:  Diagnosis Date  . CAP (community acquired pneumonia) 1968   [place] ([facility])  . Cataract   . COPD (chronic obstructive pulmonary disease) ([place])   . Diabetes mellitus   . Diverticulosis of colon 11/19/12   left colon, Dr Carlean Purl  . GERD (gastroesophageal reflux disease)   . History of adenomatous polyps of colon - probable attenuated polyposis 07/17/2008   2004: 2 polyps max 8 mm 1 lost and 1 infammatory 2010: 7 polyps TV and tubular adenomas max 6 mm 11/19/2012 :4 polyps; max 1 cm removed; all adenomas 01/05/2017 8 polyps max 10 mm adenomas recall 2021    . Hyperlipidemia   . Hypertension   . Personal history of adenomatous colonic polyps 07/17/2008   2004 2 polyps max 8 mm (adenomas) 2010 7 polyps TV and tubular adenomas max 6 mm 11/19/2012     Past Surgical History:  Procedure Laterality Date  . APPENDECTOMY  1959  . CATARACT EXTRACTION     . COLONOSCOPY  multiple   adenomatous polyps, Dr Carlean Purl  . ESOPHAGOGASTRODUODENOSCOPY  multiple  . HERNIA REPAIR  1997   left inguinal   Family History  Problem Relation Age of Onset  . Heart disease Father     CHF  . Stroke Father 65  . Alzheimer's disease Mother   . Pulmonary embolism Brother   . Asthma Neg Hx   . Colon cancer Neg Hx   . Esophageal cancer Neg Hx   . Pancreatic cancer Neg Hx   . Prostate cancer Neg Hx   . Rectal cancer Neg Hx   . Stomach cancer Neg Hx    Social History  Substance Use Topics  . Smoking status: Current Every Day Smoker    Types: Cigarettes, E-cigarettes    Start date: 10/07/2012    Last attempt to quit: 10/31/2012  . Smokeless tobacco: Never Used     Comment: smoked ages [age], up to 1 ppd . Smoked 2-3 cigars/ day [age]  . Alcohol use Yes     Comment: occasionally    Review of Systems  Constitutional: Negative for chills and fever.  HENT: Positive for ear pain. Negative for congestion, ear discharge, hearing loss, rhinorrhea and sore throat.   Eyes: Negative.   Respiratory: Negative for cough and shortness of breath.   Cardiovascular: Negative for chest pain and palpitations.  Gastrointestinal: Negative.  Negative for vomiting.  Genitourinary: Negative.   Musculoskeletal: Negative.   Skin: Negative.  Negative for rash.  Neurological: Negative for dizziness, weakness and headaches.    Allergies  Patient has no known allergies.  Home Medications   Prior to Admission medications   Medication Sig Start Date End Date Taking? Authorizing Provider  albuterol (PROVENTIL HFA;VENTOLIN HFA) 108 (90 Base) MCG/ACT inhaler Inhale 2 puffs into the lungs every 6 (six) hours as needed for wheezing or shortness of breath. 12/15/16  Yes Golden Circle, FNP  amLODipine (NORVASC) 5 MG tablet Take 1 tablet by mouth daily. 05/17/15  Yes Historical Provider, MD  aspirin EC 81 MG tablet Take 81 mg by mouth daily.   Yes Historical Provider, MD   finasteride (PROSCAR) 5 MG tablet Take 5 mg by mouth daily.   Yes Historical Provider, MD  insulin lispro protamine-lispro (HUMALOG 75/25 MIX) (75-25) 100 UNIT/ML SUSP injection Inject 40 Units into the skin 2 (two) times daily. Inject 45 units am and 40 units pm    Yes Historical Provider, MD  INVOKANA 100 MG TABS tablet Take 1 tablet by mouth. 07/13/15  Yes Historical Provider, MD  losartan-hydrochlorothiazide (HYZAAR) 100-25 MG per tablet Take 1 tablet by mouth daily.   Yes Historical Provider, MD  metFORMIN (GLUCOPHAGE) 1000 MG tablet TAKE 1 TABLET TWICE A DAY WITH MEALS 05/08/12  Yes Hendricks Limes, MD  Multiple Vitamins-Minerals (MULTIVITAMIN WITH MINERALS) tablet Take 1 tablet by mouth daily.   Yes Historical Provider, MD  omeprazole (PRILOSEC) 40 MG capsule TAKE 1 CAPSULE BY MOUTH  DAILY BEFORE BREAKFAST 01/26/17  Yes Gatha Mayer, MD  fluticasone [facility]) 50 MCG/ACT nasal spray Place 2 sprays into both nostrils daily. 02/17/17   Barnet Glasgow, NP  umeclidinium-vilanterol (ANORO ELLIPTA) 62.5-25 MCG/INH AEPB Inhale 1 puff into the lungs daily. 01/12/17   Golden Circle, FNP   Meds Ordered and Administered this Visit  Medications - No data to display  BP 130/78 (BP Location: Left Arm)   Pulse 99   Temp 98 F (36.7 C) (Oral)   Resp 18   SpO2 96%  No data found.   Physical Exam  Constitutional: He is oriented to person, place, and time. He appears well-developed and well-nourished. No distress.  HENT:  Head: Normocephalic and atraumatic.  Right Ear: Tympanic membrane and external ear normal.  Left Ear: External ear normal. A middle ear effusion is present.  Mouth/Throat: Oropharynx is clear and moist.  Eyes: Conjunctivae are normal. Right eye exhibits no discharge. Left eye exhibits no discharge.  Neck: Normal range of motion.  Cardiovascular: Normal rate and regular rhythm.   Pulmonary/Chest: Effort normal and breath sounds normal.  Lymphadenopathy:       Head (right  side): No submental, no submandibular, no tonsillar and no preauricular adenopathy present.       Head (left side): No submental, no submandibular, no tonsillar and no preauricular adenopathy present.    He has no cervical adenopathy.  Neurological: He is alert and oriented to person, place, and time.  Skin: Skin is warm and dry. Capillary refill takes less than 2 seconds. He is not diaphoretic.  Psychiatric: He has a normal mood and affect. His behavior is normal.  Nursing note and vitals reviewed.   Urgent Care Course     Procedures (including critical care time)  Labs Review Labs Reviewed - No data to display  Imaging Review No results found.     MDM   1. Left otitis media with effusion  Treating for middle ear effusion, recommend Flonase daily, follow up with PCP if symptoms fail to improve or worsen.      Barnet Glasgow, NP 02/17/17 1240

## 2017-02-17 NOTE — Discharge Instructions (Signed)
You have fluid buildup in your left ear. I have prescribed a nasal steroid called Flonase, do 2 sprays each nostril once a day. This will help open up your tubes, and allow for the fluid to drain off of your ears. Should your symptoms persist past one week, follow-up with your primary care provider or return to clinic.

## 2017-07-14 DIAGNOSIS — R972 Elevated prostate specific antigen [PSA]: Secondary | ICD-10-CM | POA: Diagnosis not present

## 2017-07-14 DIAGNOSIS — R3915 Urgency of urination: Secondary | ICD-10-CM | POA: Diagnosis not present

## 2017-07-15 LAB — PSA: PSA: 2.11

## 2017-08-02 DIAGNOSIS — E1165 Type 2 diabetes mellitus with hyperglycemia: Secondary | ICD-10-CM | POA: Diagnosis not present

## 2017-08-02 DIAGNOSIS — E7800 Pure hypercholesterolemia, unspecified: Secondary | ICD-10-CM | POA: Diagnosis not present

## 2017-08-09 DIAGNOSIS — E7800 Pure hypercholesterolemia, unspecified: Secondary | ICD-10-CM | POA: Diagnosis not present

## 2017-08-09 DIAGNOSIS — Z23 Encounter for immunization: Secondary | ICD-10-CM | POA: Diagnosis not present

## 2017-08-09 DIAGNOSIS — I10 Essential (primary) hypertension: Secondary | ICD-10-CM | POA: Diagnosis not present

## 2017-08-09 DIAGNOSIS — E1165 Type 2 diabetes mellitus with hyperglycemia: Secondary | ICD-10-CM | POA: Diagnosis not present

## 2017-09-27 ENCOUNTER — Ambulatory Visit (HOSPITAL_COMMUNITY)
Admission: EM | Admit: 2017-09-27 | Discharge: 2017-09-27 | Disposition: A | Discharge status: Home / Self Care | Payer: Medicare Other | Attending: Family Medicine | Admitting: Family Medicine

## 2017-09-27 ENCOUNTER — Encounter (HOSPITAL_COMMUNITY): Payer: Self-pay | Admitting: Emergency Medicine

## 2017-09-27 DIAGNOSIS — J00 Acute nasopharyngitis [common cold]: Secondary | ICD-10-CM

## 2017-09-27 MED ORDER — CETIRIZINE-PSEUDOEPHEDRINE ER 5-120 MG PO TB12: 1.0000 | ORAL_TABLET | Freq: Every day | ORAL | 0 refills | Status: DC

## 2017-09-27 MED ORDER — BENZONATATE 100 MG PO CAPS: 100.0000 mg | ORAL_CAPSULE | Freq: Three times a day (TID) | ORAL | 0 refills | Status: DC

## 2017-09-27 NOTE — ED Provider Notes (Signed)
Karl Lawson    CSN: [id number] Arrival date & time: 09/27/17  1001     History   Chief Complaint Chief Complaint  Patient presents with  . URI    HPI Karl Lawson. is a 71 y.o. male.   71 year old male comes in for 3-4 day history of URI symptoms. Symptoms include chest congestion, dry cough, burning of both eyes, rhinorrhea, nasal congestion, mild sore throat. Denies fever, chills, night sweats. Denies shortness of breath, trouble breathing. Tried otc tylenol with some relief. States he used his inhaler once in the past few days with good relief. Was former cigarette smoker, now uses e-cigarette every day.       Past Medical History:  Diagnosis Date  . CAP (community acquired pneumonia) 1968   [place] ([facility])  . Cataract   . COPD (chronic obstructive pulmonary disease) ([place])   . Diabetes mellitus   . Diverticulosis of colon 11/19/12   left colon, Dr Carlean Purl  . GERD (gastroesophageal reflux disease)   . History of adenomatous polyps of colon - probable attenuated polyposis 07/17/2008   2004: 2 polyps max 8 mm 1 lost and 1 infammatory 2010: 7 polyps TV and tubular adenomas max 6 mm 11/19/2012 :4 polyps; max 1 cm removed; all adenomas 01/05/2017 8 polyps max 10 mm adenomas recall 2021    . Hyperlipidemia   . Hypertension   . Personal history of adenomatous colonic polyps 07/17/2008   2004 2 polyps max 8 mm (adenomas) 2010 7 polyps TV and tubular adenomas max 6 mm 11/19/2012      Patient Active Problem List   Diagnosis Date Noted  . COPD (chronic obstructive pulmonary disease) ([place]) 12/15/2016  . Skin tag 12/15/2016  . Adjustment disorder with mixed anxiety and depressed mood 04/02/2013  . Depression with anxiety 03/04/2013  . Abnormal chest x-ray 11/27/2012  . ABNORMAL ELECTROCARDIOGRAM 10/22/2010  . OTHER HEART BLOCK 10/14/2010  . GERD 05/22/2009  . Diabetes type 2, uncontrolled ([place]) 07/17/2008  . Hyperlipidemia 07/17/2008  . Essential  hypertension 07/17/2008  . History of adenomatous polyps of colon - probable attenuated polyposis 07/17/2008  . ELEVATED PROSTATE SPECIFIC ANTIGEN 07/06/2007    Past Surgical History:  Procedure Laterality Date  . APPENDECTOMY  1959  . CATARACT EXTRACTION    . COLONOSCOPY  multiple   adenomatous polyps, Dr Carlean Purl  . ESOPHAGOGASTRODUODENOSCOPY  multiple  . HERNIA REPAIR  1997   left inguinal       Home Medications    Prior to Admission medications   Medication Sig Start Date End Date Taking? Authorizing Provider  amLODipine (NORVASC) 5 MG tablet Take 1 tablet by mouth daily. 05/17/15  Yes [provider]  aspirin EC 81 MG tablet Take 81 mg by mouth daily.   Yes [provider]  finasteride (PROSCAR) 5 MG tablet Take 5 mg by mouth daily.   Yes [provider]  fluticasone (FLONASE) 50 MCG/ACT nasal spray Place 2 sprays into both nostrils daily. 02/17/17  Yes Barnet Glasgow, NP  insulin lispro protamine-lispro (HUMALOG 75/25 MIX) (75-25) 100 UNIT/ML SUSP injection Inject 40 Units into the skin 2 (two) times daily. Inject 45 units am and 40 units pm    Yes [provider]  INVOKANA 100 MG TABS tablet Take 1 tablet by mouth. 07/13/15  Yes [provider]  losartan-hydrochlorothiazide (HYZAAR) 100-25 MG per tablet Take 1 tablet by mouth daily.   Yes [provider]  metFORMIN (GLUCOPHAGE) 1000  MG tablet TAKE 1 TABLET TWICE A DAY WITH MEALS 05/08/12  Yes Hendricks Limes, MD  Multiple Vitamins-Minerals (MULTIVITAMIN WITH MINERALS) tablet Take 1 tablet by mouth daily.   Yes [provider]  omeprazole (PRILOSEC) 40 MG capsule TAKE 1 CAPSULE BY MOUTH  DAILY BEFORE BREAKFAST 01/26/17  Yes Gatha Mayer, MD  umeclidinium-vilanterol (ANORO ELLIPTA) 62.5-25 MCG/INH AEPB Inhale 1 puff into the lungs daily. 01/12/17  Yes Golden Circle, FNP  albuterol (PROVENTIL HFA;VENTOLIN HFA) 108 (90 Base) MCG/ACT inhaler Inhale 2 puffs into the  lungs every 6 (six) hours as needed for wheezing or shortness of breath. 12/15/16   Golden Circle, FNP  benzonatate (TESSALON) 100 MG capsule Take 1 capsule (100 mg total) by mouth every 8 (eight) hours. 09/27/17   Tasia Catchings, Calem Cocozza V, PA-C  cetirizine-pseudoephedrine (ZYRTEC-D) 5-120 MG tablet Take 1 tablet by mouth daily. 09/27/17   Ok Edwards, PA-C    Family History Family History  Problem Relation Age of Onset  . Heart disease Father        CHF  . Stroke Father 63  . Alzheimer's disease Mother   . Pulmonary embolism Brother   . Asthma Neg Hx   . Colon cancer Neg Hx   . Esophageal cancer Neg Hx   . Pancreatic cancer Neg Hx   . Prostate cancer Neg Hx   . Rectal cancer Neg Hx   . Stomach cancer Neg Hx     Social History Social History   Tobacco Use  . Smoking status: Current Every Day Smoker    Types: Cigarettes, E-cigarettes    Start date: 10/07/2012    Last attempt to quit: 10/31/2012    Years since quitting: 4.9  . Smokeless tobacco: Never Used  . Tobacco comment: smoked ages [age], up to 1 ppd . Smoked 2-3 cigars/ day [age]  Substance Use Topics  . Alcohol use: Yes    Comment: occasionally  . Drug use: No     Allergies   Patient has no known allergies.   Review of Systems Review of Systems  Reason unable to perform ROS: See HPI as above.     Physical Exam Triage Vital Signs ED Triage Vitals [09/27/17 1016]  Enc Vitals Group     BP (!) 148/71     Pulse Rate 89     Resp (!) 22     Temp 97.9 F (36.6 C)     Temp Source Oral     SpO2 95 %     Weight      Height      Head Circumference      Peak Flow      Pain Score      Pain Loc      Pain Edu?      Excl. in [place]?    No data found.  Updated Vital Signs BP (!) 148/71 (BP Location: Left Arm)   Pulse 89   Temp 97.9 F (36.6 C) (Oral)   Resp (!) 22   SpO2 95%   Physical Exam  Constitutional: He is oriented to person, place, and time. He appears well-developed and well-nourished. No distress.  HENT:    Head: Normocephalic and atraumatic.  Right Ear: External ear and ear canal normal. Tympanic membrane is erythematous. Tympanic membrane is not bulging.  Left Ear: External ear and ear canal normal. Tympanic membrane is erythematous. Tympanic membrane is not bulging.  Nose: Nose normal. Right sinus exhibits no maxillary sinus tenderness and  no frontal sinus tenderness. Left sinus exhibits no maxillary sinus tenderness and no frontal sinus tenderness.  Mouth/Throat: Uvula is midline, oropharynx is clear and moist and mucous membranes are normal.  Eyes: Conjunctivae are normal. Pupils are equal, round, and reactive to light.  Neck: Normal range of motion. Neck supple.  Cardiovascular: Normal rate, regular rhythm and normal heart sounds. Exam reveals no gallop and no friction rub.  No murmur heard. Pulmonary/Chest: Effort normal.  Mild wheezing that resolved with cough.   Lymphadenopathy:    He has no cervical adenopathy.  Neurological: He is alert and oriented to person, place, and time.  Skin: Skin is warm and dry.  Psychiatric: He has a normal mood and affect. His behavior is normal. Judgment normal.     UC Treatments / Results  Labs (all labs ordered are listed, but only abnormal results are displayed) Labs Reviewed - No data to display  EKG  EKG Interpretation None       Radiology No results found.  Procedures Procedures (including critical care time)  Medications Ordered in UC Medications - No data to display   Initial Impression / Assessment and Plan / UC Course  I have reviewed the triage vital signs and the nursing notes.  Pertinent labs & imaging results that were available during my care of the patient were reviewed by me and considered in my medical decision making (see chart for details).    Discussed with patient history and exam most consistent with viral URI. Symptomatic treatment as needed. Push fluids. Return precautions given.   Final Clinical  Impressions(s) / UC Diagnoses   Final diagnoses:  Acute nasopharyngitis    ED Discharge Orders        Ordered    benzonatate (TESSALON) 100 MG capsule  Every 8 hours     09/27/17 1036    cetirizine-pseudoephedrine (ZYRTEC-D) 5-120 MG tablet  Daily     09/27/17 1036       Ok Edwards, [place] 09/27/17 1048

## 2017-09-27 NOTE — Discharge Instructions (Signed)
Tessalon for cough. Start flonase, zyrtec-D for nasal congestion. You can use over the counter nasal saline rinse such as neti pot for nasal congestion. Keep hydrated, your urine should be clear to pale yellow in color. Tylenol/motrin for fever and pain. Monitor for any worsening of symptoms, chest pain, shortness of breath, wheezing, swelling of the throat, follow up for reevaluation.   For sore throat try using a honey-based tea. Use 3 teaspoons of honey with juice squeezed from half lemon. Place shaved pieces of ginger into 1/2-1 cup of water and warm over stove top. Then mix the ingredients and repeat every 4 hours as needed.

## 2017-09-27 NOTE — ED Triage Notes (Signed)
PT C/O: cold sx  ONSET: 3-4 days  SX INCLUDE: chest congestion, dry cough, burning of both eyes  DENIES: fever, SOB, dyspnea    TAKING MEDS: acetaminophen w/temp relief.    A&O x4... NAD... Ambulatory

## 2017-10-12 DIAGNOSIS — E113292 Type 2 diabetes mellitus with mild nonproliferative diabetic retinopathy without macular edema, left eye: Secondary | ICD-10-CM | POA: Diagnosis not present

## 2017-10-12 DIAGNOSIS — E113591 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, right eye: Secondary | ICD-10-CM | POA: Diagnosis not present

## 2017-10-12 DIAGNOSIS — H353132 Nonexudative age-related macular degeneration, bilateral, intermediate dry stage: Secondary | ICD-10-CM | POA: Diagnosis not present

## 2017-10-12 DIAGNOSIS — H35033 Hypertensive retinopathy, bilateral: Secondary | ICD-10-CM | POA: Diagnosis not present

## 2017-10-12 LAB — HM DIABETES EYE EXAM

## 2017-11-28 DIAGNOSIS — H353132 Nonexudative age-related macular degeneration, bilateral, intermediate dry stage: Secondary | ICD-10-CM | POA: Diagnosis not present

## 2017-12-28 DIAGNOSIS — H353132 Nonexudative age-related macular degeneration, bilateral, intermediate dry stage: Secondary | ICD-10-CM | POA: Diagnosis not present

## 2018-02-05 ENCOUNTER — Other Ambulatory Visit: Payer: Self-pay | Admitting: Internal Medicine

## 2018-02-07 DIAGNOSIS — E7800 Pure hypercholesterolemia, unspecified: Secondary | ICD-10-CM | POA: Diagnosis not present

## 2018-02-07 DIAGNOSIS — B379 Candidiasis, unspecified: Secondary | ICD-10-CM | POA: Diagnosis not present

## 2018-02-07 DIAGNOSIS — I10 Essential (primary) hypertension: Secondary | ICD-10-CM | POA: Diagnosis not present

## 2018-02-07 DIAGNOSIS — E1165 Type 2 diabetes mellitus with hyperglycemia: Secondary | ICD-10-CM | POA: Diagnosis not present

## 2018-05-23 DIAGNOSIS — H353111 Nonexudative age-related macular degeneration, right eye, early dry stage: Secondary | ICD-10-CM | POA: Diagnosis not present

## 2018-05-23 DIAGNOSIS — H35433 Paving stone degeneration of retina, bilateral: Secondary | ICD-10-CM | POA: Diagnosis not present

## 2018-05-23 DIAGNOSIS — H353122 Nonexudative age-related macular degeneration, left eye, intermediate dry stage: Secondary | ICD-10-CM | POA: Diagnosis not present

## 2018-05-23 DIAGNOSIS — E113293 Type 2 diabetes mellitus with mild nonproliferative diabetic retinopathy without macular edema, bilateral: Secondary | ICD-10-CM | POA: Diagnosis not present

## 2018-07-17 LAB — PSA: PSA: 7.03

## 2018-07-23 DIAGNOSIS — R3915 Urgency of urination: Secondary | ICD-10-CM | POA: Diagnosis not present

## 2018-08-02 DIAGNOSIS — E7800 Pure hypercholesterolemia, unspecified: Secondary | ICD-10-CM | POA: Diagnosis not present

## 2018-08-02 DIAGNOSIS — E1165 Type 2 diabetes mellitus with hyperglycemia: Secondary | ICD-10-CM | POA: Diagnosis not present

## 2018-08-09 DIAGNOSIS — E1165 Type 2 diabetes mellitus with hyperglycemia: Secondary | ICD-10-CM | POA: Diagnosis not present

## 2018-08-09 DIAGNOSIS — E7800 Pure hypercholesterolemia, unspecified: Secondary | ICD-10-CM | POA: Diagnosis not present

## 2018-08-09 DIAGNOSIS — Z23 Encounter for immunization: Secondary | ICD-10-CM | POA: Diagnosis not present

## 2018-08-09 DIAGNOSIS — I10 Essential (primary) hypertension: Secondary | ICD-10-CM | POA: Diagnosis not present

## 2018-10-15 DIAGNOSIS — H353132 Nonexudative age-related macular degeneration, bilateral, intermediate dry stage: Secondary | ICD-10-CM | POA: Diagnosis not present

## 2018-10-15 DIAGNOSIS — E113292 Type 2 diabetes mellitus with mild nonproliferative diabetic retinopathy without macular edema, left eye: Secondary | ICD-10-CM | POA: Diagnosis not present

## 2018-10-15 DIAGNOSIS — H35033 Hypertensive retinopathy, bilateral: Secondary | ICD-10-CM | POA: Diagnosis not present

## 2018-10-15 DIAGNOSIS — E113591 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, right eye: Secondary | ICD-10-CM | POA: Diagnosis not present

## 2018-10-15 LAB — HM DIABETES EYE EXAM

## 2019-03-11 DIAGNOSIS — L0390 Cellulitis, unspecified: Secondary | ICD-10-CM | POA: Diagnosis not present

## 2019-03-11 DIAGNOSIS — E7800 Pure hypercholesterolemia, unspecified: Secondary | ICD-10-CM | POA: Diagnosis not present

## 2019-03-11 DIAGNOSIS — E1165 Type 2 diabetes mellitus with hyperglycemia: Secondary | ICD-10-CM | POA: Diagnosis not present

## 2019-03-11 DIAGNOSIS — I10 Essential (primary) hypertension: Secondary | ICD-10-CM | POA: Diagnosis not present

## 2019-03-14 ENCOUNTER — Other Ambulatory Visit: Payer: Self-pay

## 2019-03-14 NOTE — Patient Outreach (Signed)
[facility]) Care Management  03/14/2019  Karl Lawson. 1946-04-12 [id number]   Medication Adherence call to Mr. Karl Lawson spoke with patient he is due on Simvastatin 40 mg and Losartan 100 mg patient explain he is still taking 1 tablet daily and has enough for two more weeks. Karl Lawson is showing past due under [place].   [facility] Management Direct Dial [phone]  Fax [phone] [email or web address]

## 2019-03-29 ENCOUNTER — Other Ambulatory Visit: Payer: Self-pay | Admitting: Internal Medicine

## 2019-05-01 ENCOUNTER — Other Ambulatory Visit: Payer: Self-pay

## 2019-05-01 NOTE — Patient Outreach (Signed)
[facility]) Care Management  05/01/2019  Jamere Stidham. 06-17-1946 [id number]   Medication Adherence call to Mr. Kem Boroughs [facility] spoke with patient he is past due on Losartan 100 mg patient explain he is taking 1 tablet daily and has enough for 3 more month patient did not no why he still had medication.Mr. Faria is showing past due under [place].   [place] Management Direct Dial [phone]  Fax [phone] [email or web address]

## 2019-06-03 ENCOUNTER — Other Ambulatory Visit: Payer: Self-pay | Admitting: Internal Medicine

## 2019-07-06 LAB — PSA: PSA: 2.7

## 2019-07-22 ENCOUNTER — Other Ambulatory Visit: Payer: Self-pay | Admitting: Internal Medicine

## 2019-07-29 ENCOUNTER — Telehealth: Payer: Self-pay

## 2019-07-29 DIAGNOSIS — E113593 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, bilateral: Secondary | ICD-10-CM | POA: Insufficient documentation

## 2019-07-29 NOTE — Progress Notes (Signed)
Subjective:   Patient ID: Karl Lawson., male    DOB: Mar 16, 1946, 73 y.o.   MRN: [id number]  Karl Lawson. is a pleasant 73 y.o. year old male who presents to clinic today with New Patient (Initial Visit) (Patient is here today to establish care. Pt is not currently fasting. Declines PNV vaccine. States that will wait till Nov OV with Endo to have feet checked. Last eye exam in January No DM retinopathy. Agrees to Hep-C screening.)  on 07/30/2019  HPI:  DM- Diagnosed in 1997.  Followed by Dr. Chalmers Cater.  Last a1c was 7.2 ON 08/02/18. Currently taking Humalog 40 mg twice daily, Metformin 1000 twice daily and Invo[place] 100 mg.  Checks sugars twice daily.  Fasting usually around 147.   Lab Results  Component Value Date   HGBA1C 9.0 (H) 05/07/2009   I am unable to read her notes.  HTN- on Hyzaar and and amlodipine. Denies CP, SOB or blurred vision.  COPD- takes Ellipta and as needed albuterol.  HLD- on simvastatin 40 mg daily.  Current Outpatient Medications on File Prior to Visit  Medication Sig Dispense Refill   amLODipine (NORVASC) 5 MG tablet Take 1 tablet by mouth daily.     aspirin EC 81 MG tablet Take 81 mg by mouth daily.     finasteride (PROSCAR) 5 MG tablet Take 5 mg by mouth daily.     fluticasone (FLONASE) 50 MCG/ACT nasal spray Place 2 sprays into both nostrils daily. 15.8 g 1   insulin lispro protamine-lispro (HUMALOG 75/25 MIX) (75-25) 100 UNIT/ML SUSP injection Inject 40 Units into the skin 2 (two) times daily.     INVOKANA 100 MG TABS tablet Take 1 tablet by mouth.     losartan-hydrochlorothiazide (HYZAAR) 100-25 MG per tablet Take 1 tablet by mouth daily.     metFORMIN (GLUCOPHAGE) 1000 MG tablet TAKE 1 TABLET TWICE A DAY WITH MEALS 60 tablet 0   Multiple Vitamins-Minerals (MULTIVITAMIN WITH MINERALS) tablet Take 1 tablet by mouth daily.     omeprazole (PRILOSEC) 40 MG capsule TAKE 1 CAPSULE BY MOUTH  DAILY BEFORE BREAKFAST 90 capsule 0   No current  facility-administered medications on file prior to visit.     No Known Allergies  Past Medical History:  Diagnosis Date   CAP (community acquired pneumonia) 1968   [place] ([facility])   Cataract    COPD (chronic obstructive pulmonary disease) ([place])    Diabetes mellitus    Diverticulosis of colon 11/19/12   left colon, Dr Carlean Purl   GERD (gastroesophageal reflux disease)    History of adenomatous polyps of colon - probable attenuated polyposis 07/17/2008   2004: 2 polyps max 8 mm 1 lost and 1 infammatory 2010: 7 polyps TV and tubular adenomas max 6 mm 11/19/2012 :4 polyps; max 1 cm removed; all adenomas 01/05/2017 8 polyps max 10 mm adenomas recall 2021     Hyperlipidemia    Hypertension    Personal history of adenomatous colonic polyps 07/17/2008   2004 2 polyps max 8 mm (adenomas) 2010 7 polyps TV and tubular adenomas max 6 mm 11/19/2012      Past Surgical History:  Procedure Laterality Date   APPENDECTOMY  1959   CATARACT EXTRACTION     COLONOSCOPY  multiple   adenomatous polyps, Dr Carlean Purl   ESOPHAGOGASTRODUODENOSCOPY  multiple   HERNIA REPAIR  1997   left inguinal    Family History  Problem Relation Age of Onset   Heart  disease Father        CHF   Stroke Father 59   Alzheimer's disease Mother    Pulmonary embolism Brother    Asthma Neg Hx    Colon cancer Neg Hx    Esophageal cancer Neg Hx    Pancreatic cancer Neg Hx    Prostate cancer Neg Hx    Rectal cancer Neg Hx    Stomach cancer Neg Hx     Social History   Socioeconomic History   Marital status: Married    Spouse name: Not on file   Number of children: 2   Years of education: 14   Highest education level: Not on file  Occupational History   Occupation: Retired - Mostly  Social Designer, fashion/clothing strain: Not on file   Food insecurity    Worry: Not on file    Inability: Not on file   Transportation needs    Medical: Not on file    Non-medical: Not on file    Tobacco Use   Smoking status: Current Every Day Smoker    Types: Cigarettes, E-cigarettes    Start date: 10/07/2012    Last attempt to quit: 10/31/2012    Years since quitting: 6.7   Smokeless tobacco: Never Used   Tobacco comment: smoked ages [age], up to 1 ppd . Smoked 2-3 cigars/ day [age]  Substance and Sexual Activity   Alcohol use: Yes    Comment: occasionally   Drug use: No   Sexual activity: Not on file  Lifestyle   Physical activity    Days per week: Not on file    Minutes per session: Not on file   Stress: Not on file  Relationships   Social connections    Talks on phone: Not on file    Gets together: Not on file    Attends religious service: Not on file    Active member of club or organization: Not on file    Attends meetings of clubs or organizations: Not on file    Relationship status: Not on file   Intimate partner violence    Fear of current or ex partner: Not on file    Emotionally abused: Not on file    Physically abused: Not on file    Forced sexual activity: Not on file  Other Topics Concern   Not on file  Social History Narrative   Fun: Building services engineer, working in the yard and planting in the garden.    The PMH, PSH, Social History, Family History, Medications, and allergies have been reviewed in [facility], and have been updated if relevant.  Review of Systems  Constitutional: Negative.   HENT: Negative.   Respiratory: Negative.   Cardiovascular: Negative.   Gastrointestinal: Negative.   Endocrine: Negative.   Genitourinary: Negative.   Musculoskeletal: Negative.   Allergic/Immunologic: Negative.   Neurological: Negative.   Hematological: Negative.   Psychiatric/Behavioral: Negative.   All other systems reviewed and are negative.      Objective:    BP 140/68 (BP Location: Right Arm, Patient Position: Sitting, Cuff Size: Large)    Pulse 90    Temp 98.4 F (36.9 C) (Oral)    Ht 5\' 8"  (1.727 m)    Wt 256 lb 6.4 oz (116.3 kg)    SpO2 92%     BMI 38.99 kg/m    Physical Exam  General:  pleasant male in no acute distress Eyes:  PERRL Ears:  External ear exam shows no significant  lesions or deformities.  TMs normal bilaterally Hearing is grossly normal bilaterally. Nose:  External nasal examination shows no deformity or inflammation. Nasal mucosa are pink and moist without lesions or exudates. Mouth:  Oral mucosa and oropharynx without lesions or exudates.  Teeth in good repair. Neck:  no carotid bruit or thyromegaly no cervical or supraclavicular lymphadenopathy  Lungs:  Normal respiratory effort, chest expands symmetrically. Lungs are clear to auscultation, no crackles or wheezes. Heart:  Normal rate and regular rhythm. S1 and S2 normal without gallop, murmur, click, rub or other extra sounds. Abdomen:  Bowel sounds positive,abdomen soft and non-tender without masses, organomegaly or hernias noted. Pulses:  R and L posterior tibial pulses are full and equal bilaterally  Extremities:  no edema  Psych:  Good eye contact, not anxious or depressed appearing      Assessment & Plan:   Insulin dependent diabetes mellitus with complications (HCC) - Plan: Hemoglobin A1c, Lipid panel, Comprehensive metabolic panel, CBC with Differential/Platelet, TSH  Essential hypertension - Plan: Comprehensive metabolic panel, CBC with Differential/Platelet, TSH  Simple chronic bronchitis (HCC) - Plan: albuterol (VENTOLIN HFA) 108 (90 Base) MCG/ACT inhaler  Pure hypercholesterolemia - Plan: Lipid panel, Comprehensive metabolic panel, CBC with Differential/Platelet, TSH  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

## 2019-07-29 NOTE — Telephone Encounter (Signed)
Questions for Screening COVID-19  Symptom onset: None  Travel or Contacts: None  During this illness, did/does the patient experience any of the following symptoms? Fever >100.79F []   Yes [x]   No []   Unknown Subjective fever (felt feverish) []   Yes [x]   No []   Unknown Chills []   Yes [x]   No []   Unknown Muscle aches (myalgia) []   Yes [x]   No []   Unknown Runny nose (rhinorrhea) []   Yes [x]   No []   Unknown Sore throat []   Yes [x]   No []   Unknown Cough (new onset or worsening of chronic cough) []   Yes [x]   No []   Unknown Shortness of breath (dyspnea) []   Yes [x]   No []   Unknown Nausea or vomiting []   Yes [x]   No []   Unknown Headache []   Yes [x]   No []   Unknown Abdominal pain  []   Yes [x]   No []   Unknown Diarrhea (?3 loose/looser than normal stools/24hr period) []   Yes [x]   No []   Unknown Other, specify:  Patient risk factors: Smoker? []   Current []   Former []   Never If male, currently pregnant? []   Yes []   No  Patient Active Problem List   Diagnosis Date Noted  . COPD (chronic obstructive pulmonary disease) ([place]) 12/15/2016  . Skin tag 12/15/2016  . Adjustment disorder with mixed anxiety and depressed mood 04/02/2013  . Depression with anxiety 03/04/2013  . Abnormal chest x-ray 11/27/2012  . ABNORMAL ELECTROCARDIOGRAM 10/22/2010  . OTHER HEART BLOCK 10/14/2010  . GERD 05/22/2009  . Diabetes type 2, uncontrolled ([place]) 07/17/2008  . Hyperlipidemia 07/17/2008  . Essential hypertension 07/17/2008  . History of adenomatous polyps of colon - probable attenuated polyposis 07/17/2008  . ELEVATED PROSTATE SPECIFIC ANTIGEN 07/06/2007    Plan:  []   High risk for COVID-19 with red flags go to ED (with CP, SOB, weak/lightheaded, or fever > 101.5). Call ahead.  []   High risk for COVID-19 but stable. Inform provider and coordinate time for [facility] visit.   []   No red flags but URI signs or symptoms okay for [facility] visit.

## 2019-07-30 ENCOUNTER — Encounter: Payer: Self-pay | Admitting: Family Medicine

## 2019-07-30 ENCOUNTER — Other Ambulatory Visit: Payer: Self-pay

## 2019-07-30 ENCOUNTER — Ambulatory Visit (INDEPENDENT_AMBULATORY_CARE_PROVIDER_SITE_OTHER): Payer: Medicare Other | Admitting: Family Medicine

## 2019-07-30 VITALS — BP 140/68 | HR 90 | Temp 98.4°F | Ht 68.0 in | Wt 256.4 lb

## 2019-07-30 DIAGNOSIS — E113593 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, bilateral: Secondary | ICD-10-CM

## 2019-07-30 DIAGNOSIS — E7800 Pure hypercholesterolemia, unspecified: Secondary | ICD-10-CM | POA: Diagnosis not present

## 2019-07-30 DIAGNOSIS — E118 Type 2 diabetes mellitus with unspecified complications: Secondary | ICD-10-CM | POA: Diagnosis not present

## 2019-07-30 DIAGNOSIS — IMO0001 Reserved for inherently not codable concepts without codable children: Secondary | ICD-10-CM

## 2019-07-30 DIAGNOSIS — J410 Simple chronic bronchitis: Secondary | ICD-10-CM | POA: Diagnosis not present

## 2019-07-30 DIAGNOSIS — I10 Essential (primary) hypertension: Secondary | ICD-10-CM | POA: Diagnosis not present

## 2019-07-30 DIAGNOSIS — Z1159 Encounter for screening for other viral diseases: Secondary | ICD-10-CM

## 2019-07-30 DIAGNOSIS — Z794 Long term (current) use of insulin: Secondary | ICD-10-CM | POA: Diagnosis not present

## 2019-07-30 LAB — CBC WITH DIFFERENTIAL/PLATELET
Basophils Absolute: 0 10*3/uL (ref 0.0–0.1)
Basophils Relative: 0.2 % (ref 0.0–3.0)
Eosinophils Absolute: 0.2 10*3/uL (ref 0.0–0.7)
Eosinophils Relative: 3.1 % (ref 0.0–5.0)
HCT: 44.8 % (ref 39.0–52.0)
Hemoglobin: 14.6 g/dL (ref 13.0–17.0)
Lymphocytes Relative: 18.9 % (ref 12.0–46.0)
Lymphs Abs: 1.4 10*3/uL (ref 0.7–4.0)
MCHC: 32.5 g/dL (ref 30.0–36.0)
MCV: 80.7 fl (ref 78.0–100.0)
Monocytes Absolute: 0.9 10*3/uL (ref 0.1–1.0)
Monocytes Relative: 12.1 % — ABNORMAL HIGH (ref 3.0–12.0)
Neutro Abs: 5 10*3/uL (ref 1.4–7.7)
Neutrophils Relative %: 65.7 % (ref 43.0–77.0)
Platelets: 269 10*3/uL (ref 150.0–400.0)
RBC: 5.55 Mil/uL (ref 4.22–5.81)
RDW: 15.6 % — ABNORMAL HIGH (ref 11.5–15.5)
WBC: 7.6 10*3/uL (ref 4.0–10.5)

## 2019-07-30 LAB — COMPREHENSIVE METABOLIC PANEL
ALT: 13 U/L (ref 0–53)
AST: 14 U/L (ref 0–37)
Albumin: 4.1 g/dL (ref 3.5–5.2)
Alkaline Phosphatase: 107 U/L (ref 39–117)
BUN: 15 mg/dL (ref 6–23)
CO2: 25 mEq/L (ref 19–32)
Calcium: 10.3 mg/dL (ref 8.4–10.5)
Chloride: 105 mEq/L (ref 96–112)
Creatinine, Ser: 0.92 mg/dL (ref 0.40–1.50)
GFR: 80.46 mL/min (ref >60.00)
Glucose, Bld: 98 mg/dL (ref 70–99)
Potassium: 3.7 mEq/L (ref 3.5–5.1)
Sodium: 139 mEq/L (ref 135–145)
Total Bilirubin: 0.5 mg/dL (ref 0.2–1.2)
Total Protein: 6.8 g/dL (ref 6.0–8.3)

## 2019-07-30 LAB — LIPID PANEL
Cholesterol: 108 mg/dL (ref 0–200)
HDL: 36.6 mg/dL — ABNORMAL LOW (ref >39.00)
LDL Cholesterol: 51 mg/dL (ref 0–99)
NonHDL: 71.06
Total CHOL/HDL Ratio: 3
Triglycerides: 101 mg/dL (ref 0.0–149.0)
VLDL: 20.2 mg/dL (ref 0.0–40.0)

## 2019-07-30 LAB — TSH: TSH: 1.21 u[IU]/mL (ref 0.35–4.50)

## 2019-07-30 MED ORDER — ANORO ELLIPTA 62.5-25 MCG/INH IN AEPB: 1.0000 | INHALATION_SPRAY | Freq: Every day | RESPIRATORY_TRACT | 3 refills | Status: DC

## 2019-07-30 MED ORDER — ALBUTEROL SULFATE HFA 108 (90 BASE) MCG/ACT IN AERS: 2.0000 | INHALATION_SPRAY | Freq: Four times a day (QID) | RESPIRATORY_TRACT | 11 refills | Status: DC | PRN

## 2019-07-30 MED ORDER — SIMVASTATIN 40 MG PO TABS: 40.0000 mg | ORAL_TABLET | Freq: Every day | ORAL | 3 refills | Status: DC

## 2019-07-30 NOTE — Assessment & Plan Note (Signed)
Followed by by Dr. Herbert Deaner and retinal specialist.

## 2019-07-30 NOTE — Assessment & Plan Note (Signed)
Has been under better controlled, followed by endo.  He has an appointment with her next month. . No changes made to rxs. He is on ARB and statin. See avs- I have requested records from all of his providers.

## 2019-07-30 NOTE — Assessment & Plan Note (Signed)
Inhalers have been keeping symptoms under controlled.

## 2019-07-30 NOTE — Assessment & Plan Note (Signed)
On simvastatin 40 mg daily. Denies myalgias. Orders Placed This Encounter  Procedures  . Hepatitis C Antibody  . Lipid panel  . Comprehensive metabolic panel  . CBC with Differential/Platelet  . TSH

## 2019-07-30 NOTE — Patient Instructions (Addendum)
Great to see you. I will call you with your lab results from today and you can view them online.   Please sign medical release form for Dr. Chalmers Cater at [facility], Dr. Dorina Hoyer, Dr. Baird Cancer, Dr. Herbert Deaner and Dr. Jerold Coombe.

## 2019-07-30 NOTE — Assessment & Plan Note (Signed)
Well controlled on hyzaar and amlodipine.  No changes made

## 2019-07-31 LAB — HEPATITIS C ANTIBODY
Hepatitis C Ab: NONREACTIVE
SIGNAL TO CUT-OFF: 0.04 (ref <1.00)

## 2019-08-14 ENCOUNTER — Encounter: Payer: Self-pay | Admitting: Family Medicine

## 2019-08-26 ENCOUNTER — Other Ambulatory Visit: Payer: Self-pay

## 2019-08-26 MED ORDER — ALBUTEROL SULFATE HFA 108 (90 BASE) MCG/ACT IN AERS: 2.0000 | INHALATION_SPRAY | Freq: Four times a day (QID) | RESPIRATORY_TRACT | 11 refills | Status: DC | PRN

## 2019-09-03 ENCOUNTER — Other Ambulatory Visit: Payer: Self-pay

## 2019-09-03 MED ORDER — ALBUTEROL SULFATE HFA 108 (90 BASE) MCG/ACT IN AERS: 2.0000 | INHALATION_SPRAY | Freq: Four times a day (QID) | RESPIRATORY_TRACT | 11 refills | Status: DC | PRN

## 2019-09-03 NOTE — Telephone Encounter (Signed)
Last fill 08/05/19  #18g/1 Last OV 06/18/19

## 2019-09-09 DIAGNOSIS — E7800 Pure hypercholesterolemia, unspecified: Secondary | ICD-10-CM | POA: Diagnosis not present

## 2019-09-09 DIAGNOSIS — E1165 Type 2 diabetes mellitus with hyperglycemia: Secondary | ICD-10-CM | POA: Diagnosis not present

## 2019-09-16 DIAGNOSIS — E1165 Type 2 diabetes mellitus with hyperglycemia: Secondary | ICD-10-CM | POA: Diagnosis not present

## 2019-09-16 DIAGNOSIS — E7800 Pure hypercholesterolemia, unspecified: Secondary | ICD-10-CM | POA: Diagnosis not present

## 2019-09-16 DIAGNOSIS — I10 Essential (primary) hypertension: Secondary | ICD-10-CM | POA: Diagnosis not present

## 2019-09-16 DIAGNOSIS — Z794 Long term (current) use of insulin: Secondary | ICD-10-CM | POA: Diagnosis not present

## 2019-10-04 ENCOUNTER — Other Ambulatory Visit: Payer: Self-pay | Admitting: Internal Medicine

## 2019-11-20 DIAGNOSIS — E113292 Type 2 diabetes mellitus with mild nonproliferative diabetic retinopathy without macular edema, left eye: Secondary | ICD-10-CM | POA: Diagnosis not present

## 2019-11-20 DIAGNOSIS — H353132 Nonexudative age-related macular degeneration, bilateral, intermediate dry stage: Secondary | ICD-10-CM | POA: Diagnosis not present

## 2019-11-20 DIAGNOSIS — E113591 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, right eye: Secondary | ICD-10-CM | POA: Diagnosis not present

## 2019-11-20 DIAGNOSIS — H35033 Hypertensive retinopathy, bilateral: Secondary | ICD-10-CM | POA: Diagnosis not present

## 2019-12-22 ENCOUNTER — Ambulatory Visit: Payer: Medicare Other | Attending: Internal Medicine

## 2019-12-22 DIAGNOSIS — Z23 Encounter for immunization: Secondary | ICD-10-CM

## 2019-12-22 NOTE — Progress Notes (Signed)
   [id number] Vaccination [facility]  Name:  Karl Lawson.    MRN: [id number] DOB: 07-17-1946  12/22/2019  Karl Lawson was observed post Covid-19 immunization for 15 minutes without incidence. He was provided with Vaccine Information Sheet and instruction to access the V-Safe system.   Karl Lawson was instructed to call 911 with any severe reactions post vaccine: Marland Kitchen Difficulty breathing  . Swelling of your face and throat  . A fast heartbeat  . A bad rash all over your body  . Dizziness and weakness    Immunizations Administered    Name Date Dose VIS Date Route   Pfizer COVID-19 Vaccine 12/22/2019  9:48 AM 0.3 mL 10/18/2019 Intramuscular   Manufacturer: [place]   Lot: [id number]   [place]: [id number]

## 2020-01-14 ENCOUNTER — Ambulatory Visit: Payer: Medicare Other | Attending: Internal Medicine

## 2020-01-14 DIAGNOSIS — E1165 Type 2 diabetes mellitus with hyperglycemia: Secondary | ICD-10-CM | POA: Diagnosis not present

## 2020-01-14 DIAGNOSIS — I10 Essential (primary) hypertension: Secondary | ICD-10-CM | POA: Diagnosis not present

## 2020-01-14 DIAGNOSIS — Z794 Long term (current) use of insulin: Secondary | ICD-10-CM | POA: Diagnosis not present

## 2020-01-14 DIAGNOSIS — E7800 Pure hypercholesterolemia, unspecified: Secondary | ICD-10-CM | POA: Diagnosis not present

## 2020-01-14 DIAGNOSIS — Z23 Encounter for immunization: Secondary | ICD-10-CM | POA: Insufficient documentation

## 2020-01-14 NOTE — Progress Notes (Signed)
   [id number] Vaccination Clinic  Name:  Karl Lawson.    MRN: [id number] DOB: 09/26/1946  01/14/2020  Karl Lawson was observed post Covid-19 immunization for 15 minutes without incident. He was provided with Vaccine Information Sheet and instruction to access the V-Safe system.   Karl Lawson was instructed to call 911 with any severe reactions post vaccine: Marland Kitchen Difficulty breathing  . Swelling of face and throat  . A fast heartbeat  . A bad rash all over body  . Dizziness and weakness   Immunizations Administered    Name Date Dose VIS Date Route   Pfizer COVID-19 Vaccine 01/14/2020 10:38 AM 0.3 mL 10/18/2019 Intramuscular   Manufacturer: [place]   Lot: [id number]   [place]: [id number]

## 2020-03-24 ENCOUNTER — Other Ambulatory Visit: Payer: Self-pay | Admitting: Internal Medicine

## 2020-05-05 NOTE — Progress Notes (Signed)
05/05/2020 Benavides [id number] 03/14/1946   CHIEF COMPLAINT:  Schedule a colonoscopy, acid reflux symptoms   HISTORY OF PRESENT ILLNESS:  Karl Lawson. Karl Lawson is a 74 year old male with a past medical history of hypertension, hyperlipidemia, DM II, COPD, GERD, benign esophageal stricture, diverticulosis and colon polyps. He presents today to schedule a colonoscopy.  His most recent colonoscopy was 01/22/2017 which identified 8 tubular adenomatous polyps which were removed from the ascending, transverse and descending colon.  He was advised by Dr. Carlean Purl to repeat a colonoscopy in 3 years.  He is passing a normal formed brown bowel movement once daily.  He has diarrhea if he gets nervous or anxious.  He denies having any rectal bleeding or black stools.  He takes aspirin 81 mg once daily.  He takes Tylenol for headaches as needed.  He has a history of GERD for which she takes Omeprazole 40 mg once daily.  However, he ran out of his Omeprazole prescription 4 weeks ago which resulted in having acid reflux symptoms.  He complains of having heartburn every 2 to 3 days.  He takes Tums with some relief.  He complains of food getting stuck to the upper esophagus which occurs once every 2 to 3 weeks.  He drinks a few sips of water and the food passes down the esophagus within 1 to 2 minutes.  He has a history of a benign esophageal stricture was dilated at the time of an EGD in 2014.  He denies having any upper or lower abdominal pain. He takes ASA 4m daily. No other NSAID use.  His COPD is stable.  He does not require oxygen.  No other complaints today.  Labs 07/30/2019: Hemoglobin 14.6.  Hematocrit 44.8.  BUN 15.  Creatinine 0.92.  Alk phos 107.  AST 14.  ALT 13.  Pulmonary function test 01/27/2017: The FVC, FEV1, FEV1/FVC ratio and FEF25-75% are within normal limits, but there is curvature to the flow volume loop suggesting minimal small airway disease. The airway resistance is normal. While the  TLC and RV are with normal limits, the FRC is reduced. Following administration of bronchodilators, there is no significant response. The reduced diffusing capacity indicates a mild loss of functional alveolar capillary surface. Conclusions: Although there is airway obstruction and a diffusion defect suggesting emphysema, the absence of overinflation is inconsistent with that diagnosis.  Colonoscopy 01/22/2017 by Dr. GCarlean Purl  Eight 3 to 10 mm polyps in the descending colon, in the transverse colon and in the ascending colon, removed with a cold snare. Resected and retrieved. - Diverticulosis in the sigmoid colon and in the descending colon. - The examination was otherwise normal on direct and retroflexion views. - Personal history of colonic polyps - Recall colonoscopy 3 years.  1. Surgical [P], transverse and ascending, polyps (6) -TUBULAR ADENOMAS. -NO HIGH GRADE DYSPLASIA OR MALIGNANCY IDENTIFIED. 2. Surgical [P], descending, polyps (2) -TUBULAR ADENOMAS. -NO HIGH GRADE DYSPLASIA OR MALIGNANCY IDENTIFIED.  EGD 10/07/2015 by Dr. GCarlean Purl  Slightly irregular Z-line but no columnar tongues.  Normal stomach.  EGD 11/19/2012: Stricture at the GE junction dilated with a 579French Maloney 1 cm hiatal hernia   Past Medical History:  Diagnosis Date  . [place]  [place]   . [place]   . Diabetes mellitus   . Diverticulosis of colon 11/19/12   left colon, Dr GCarlean Purl .  GERD (gastroesophageal reflux disease)   . History of adenomatous polyps of colon - probable attenuated polyposis 07/17/2008   2004: 2 polyps max 8 mm 1 lost and 1 infammatory 2010: 7 polyps TV and tubular adenomas max 6 mm 11/19/2012 :4 polyps; max 1 cm removed; all adenomas 01/05/2017 8 polyps max 10 mm adenomas recall 2021    . Hyperlipidemia   . Hypertension   . Personal history of adenomatous colonic polyps 07/17/2008     2004 2 polyps max 8 mm (adenomas) 2010 7 polyps TV and tubular adenomas max 6 mm 11/19/2012     Past Surgical History:  Procedure Laterality Date  . APPENDECTOMY  1959  . CATARACT EXTRACTION    . COLONOSCOPY  multiple   adenomatous polyps, Dr Carlean Purl  . ESOPHAGOGASTRODUODENOSCOPY  multiple  . HERNIA REPAIR  1997   left inguinal    Social History: Married. Retired. He quit smoking cigars 7 or 8 years ago. He drinks one glass of wine or beer once or twice weekly. No history of drug use.    Family History:  Mother died 49's with Alzheimer's disease. Brother died in his early 75's after he had a PE/CVA s/p gastric surgery  No Known Allergies    Outpatient Encounter Medications as of 05/06/2020  Medication Sig  . albuterol (PROAIR HFA) 108 (90 Base) MCG/ACT inhaler Inhale 2 puffs into the lungs every 6 (six) hours as needed for wheezing or shortness of breath.  Marland Kitchen amLODipine (NORVASC) 5 MG tablet Take 1 tablet by mouth daily.  Marland Kitchen aspirin EC 81 MG tablet Take 81 mg by mouth daily.  . finasteride (PROSCAR) 5 MG tablet Take 5 mg by mouth daily.  . fluticasone (FLONASE) 50 MCG/ACT nasal spray Place 2 sprays into both nostrils daily.  . insulin lispro protamine-lispro (HUMALOG 75/25 MIX) (75-25) 100 UNIT/ML SUSP injection Inject 40 Units into the skin 2 (two) times daily.  . INVOKANA 100 MG TABS tablet Take 1 tablet by mouth.  . losartan-hydrochlorothiazide (HYZAAR) 100-25 MG per tablet Take 1 tablet by mouth daily.  . metFORMIN (GLUCOPHAGE) 1000 MG tablet TAKE 1 TABLET TWICE A DAY WITH MEALS  . Multiple Vitamins-Minerals (MULTIVITAMIN WITH MINERALS) tablet Take 1 tablet by mouth daily.  Marland Kitchen omeprazole (PRILOSEC) 40 MG capsule TAKE 1 CAPSULE BY MOUTH  DAILY BEFORE BREAKFAST  . simvastatin (ZOCOR) 40 MG tablet Take 1 tablet (40 mg total) by mouth at bedtime.  Marland Kitchen umeclidinium-vilanterol (ANORO ELLIPTA) 62.5-25 MCG/INH AEPB Inhale 1 puff into the lungs daily.   No facility-administered encounter  medications on file as of 05/06/2020.     REVIEW OF SYSTEMS: All other systems reviewed and negative except where noted in the History of Present Illness.  Gen: Denies fever, sweats or chills. No weight loss.  CV: Denies chest pain, palpitations or edema. Resp: Denies cough, shortness of breath of hemoptysis.  GI: See HPI.  GU : Denies urinary burning, blood in urine, increased urinary frequency or incontinence. MS: Denies joint pain, muscles aches or weakness. Derm: Denies rash, itchiness, skin lesions or unhealing ulcers. Psych: Denies depression, anxiety, memory loss, suicidal ideation and confusion. Heme: Denies bruising, bleeding. Neuro:  Denies headaches, dizziness or paresthesias. Endo:  Denies any problems with DM, thyroid or adrenal function.    PHYSICAL EXAM: BP 136/68 (BP Location: Left Arm, Patient Position: Sitting, Cuff Size: Normal)   Pulse 96   Ht '5\' 7"'  (1.702 m) Comment: height measured without shoes  Wt 246 lb (111.6 kg)   BMI  38.53 kg/m   General: Well developed  74 year old male in no acute distress. Head: Normocephalic and atraumatic. Eyes:  Sclerae non-icteric, conjunctive pink. Ears: Normal auditory acuity. Mouth: Upper and lower dentures. No ulcers or lesions.  Neck: Supple, no lymphadenopathy or thyromegaly.  Lungs: Clear bilaterally to auscultation without wheezes, crackles or rhonchi. Heart: Regular rate and rhythm. No murmur, rub or gallop appreciated.  Abdomen: Soft, nontender, non distended. No masses. No hepatosplenomegaly. Normoactive bowel sounds x 4 quadrants.  Rectal: Deferred.  Musculoskeletal: Symmetrical with no gross deformities. Skin: Warm and dry. No rash or lesions on visible extremities. Extremities: No edema. Neurological: Alert oriented x 4, no focal deficits.  Psychological:  Alert and cooperative. Normal mood and affect.  ASSESSMENT AND PLAN:  29.  73 year old male with a history of GERD and benign stricture at the GE junction  presents with heartburn and dysphagia after running out out of his Omeprazole for 1 month. -Omeprazole 40 mg once daily -EGD benefits and risks discussed including risk with sedation, risk of bleeding, perforation and infection  -Patient to call our office if his symptoms worsen  2.  History of numerous tubular adenomatous polyps -Colonoscopy benefits and risks discussed including risk with sedation, risk of bleeding, perforation and infection   3. COPD  4. DM II  Further follow-up to be determined after the above evaluation completed            CC:  Lucille Passy, MD

## 2020-05-06 ENCOUNTER — Ambulatory Visit: Payer: Medicare Other | Admitting: Nurse Practitioner

## 2020-05-06 ENCOUNTER — Encounter: Payer: Self-pay | Admitting: Nurse Practitioner

## 2020-05-06 VITALS — BP 136/68 | HR 96 | Ht 67.0 in | Wt 246.0 lb

## 2020-05-06 DIAGNOSIS — K2100 Gastro-esophageal reflux disease with esophagitis, without bleeding: Secondary | ICD-10-CM | POA: Diagnosis not present

## 2020-05-06 DIAGNOSIS — Z86010 Personal history of colonic polyps: Secondary | ICD-10-CM

## 2020-05-06 DIAGNOSIS — R1310 Dysphagia, unspecified: Secondary | ICD-10-CM

## 2020-05-06 MED ORDER — OMEPRAZOLE 40 MG PO CPDR: 40.0000 mg | DELAYED_RELEASE_CAPSULE | Freq: Every day | ORAL | 3 refills | Status: DC

## 2020-05-06 MED ORDER — NA SULFATE-K SULFATE-MG SULF 17.5-3.13-1.6 GM/177ML PO SOLN
1.0000 | Freq: Once | ORAL | 0 refills | Status: AC
Start: 2020-05-06 — End: 2020-05-06

## 2020-05-06 NOTE — Patient Instructions (Addendum)
If you are age 75 or older, your body mass index should be between 23-30. Your Body mass index is 38.53 kg/m. If this is out of the aforementioned range listed, please consider follow up with your Primary Care Provider.  If you are age 37 or younger, your body mass index should be between 19-25. Your Body mass index is 38.53 kg/m. If this is out of the aformentioned range listed, please consider follow up with your Primary Care Provider.   We have sent the following medications to your pharmacy for you to pick up at your convenience: Omeprazole 40 mg daily 30-60 minutes before breakfast.  Call office if symptoms get worse.  You have been scheduled for an endoscopy and colonoscopy. Please follow the written instructions given to you at your visit today. Please pick up your prep supplies at the pharmacy within the next 1-3 days. If you use inhalers (even only as needed), please bring them with you on the day of your procedure.  Due to recent changes in healthcare laws, you may see the results of your imaging and laboratory studies on MyChart before your provider has had a chance to review them.  We understand that in some cases there may be results that are confusing or concerning to you. Not all laboratory results come back in the same time frame and the provider may be waiting for multiple results in order to interpret others.  Please give [place] 48 hours in order for your provider to thoroughly review all the results before contacting the office for clarification of your results.

## 2020-06-05 ENCOUNTER — Telehealth: Payer: Self-pay | Admitting: Nurse Practitioner

## 2020-06-05 MED ORDER — NA SULFATE-K SULFATE-MG SULF 17.5-3.13-1.6 GM/177ML PO SOLN: 1.0000 | Freq: Once | ORAL | 0 refills | Status: AC

## 2020-06-05 NOTE — Telephone Encounter (Signed)
Pls send suprep to [facility] on [address].

## 2020-06-05 NOTE — Addendum Note (Signed)
Addended by: Horris Latino on: 06/05/2020 10:23 AM   Modules accepted: Orders

## 2020-06-05 NOTE — Telephone Encounter (Signed)
Script sent to pharmacy.

## 2020-06-26 ENCOUNTER — Encounter: Payer: Self-pay | Admitting: Internal Medicine

## 2020-07-03 ENCOUNTER — Telehealth: Payer: Self-pay | Admitting: Internal Medicine

## 2020-07-03 NOTE — Telephone Encounter (Signed)
OK Thanks No charge

## 2020-07-08 ENCOUNTER — Encounter: Payer: Self-pay | Admitting: Internal Medicine

## 2020-07-08 DIAGNOSIS — E7800 Pure hypercholesterolemia, unspecified: Secondary | ICD-10-CM | POA: Diagnosis not present

## 2020-07-08 DIAGNOSIS — E1165 Type 2 diabetes mellitus with hyperglycemia: Secondary | ICD-10-CM | POA: Diagnosis not present

## 2020-08-17 DIAGNOSIS — Z794 Long term (current) use of insulin: Secondary | ICD-10-CM | POA: Diagnosis not present

## 2020-08-17 DIAGNOSIS — E7800 Pure hypercholesterolemia, unspecified: Secondary | ICD-10-CM | POA: Diagnosis not present

## 2020-08-17 DIAGNOSIS — E1165 Type 2 diabetes mellitus with hyperglycemia: Secondary | ICD-10-CM | POA: Diagnosis not present

## 2020-08-17 DIAGNOSIS — I10 Essential (primary) hypertension: Secondary | ICD-10-CM | POA: Diagnosis not present

## 2020-11-26 DIAGNOSIS — Z20822 Contact with and (suspected) exposure to covid-19: Secondary | ICD-10-CM | POA: Diagnosis not present

## 2020-12-21 DIAGNOSIS — E1165 Type 2 diabetes mellitus with hyperglycemia: Secondary | ICD-10-CM | POA: Diagnosis not present

## 2020-12-21 DIAGNOSIS — E7800 Pure hypercholesterolemia, unspecified: Secondary | ICD-10-CM | POA: Diagnosis not present

## 2020-12-21 DIAGNOSIS — I10 Essential (primary) hypertension: Secondary | ICD-10-CM | POA: Diagnosis not present

## 2020-12-21 DIAGNOSIS — E669 Obesity, unspecified: Secondary | ICD-10-CM | POA: Diagnosis not present

## 2020-12-21 DIAGNOSIS — Z794 Long term (current) use of insulin: Secondary | ICD-10-CM | POA: Diagnosis not present

## 2020-12-24 ENCOUNTER — Ambulatory Visit: Payer: Medicare Other | Admitting: Family Medicine

## 2021-01-13 DIAGNOSIS — H43821 Vitreomacular adhesion, right eye: Secondary | ICD-10-CM | POA: Diagnosis not present

## 2021-01-13 DIAGNOSIS — E113293 Type 2 diabetes mellitus with mild nonproliferative diabetic retinopathy without macular edema, bilateral: Secondary | ICD-10-CM | POA: Diagnosis not present

## 2021-01-13 DIAGNOSIS — H353132 Nonexudative age-related macular degeneration, bilateral, intermediate dry stage: Secondary | ICD-10-CM | POA: Diagnosis not present

## 2021-01-13 DIAGNOSIS — H35433 Paving stone degeneration of retina, bilateral: Secondary | ICD-10-CM | POA: Diagnosis not present

## 2021-01-25 ENCOUNTER — Encounter: Payer: Self-pay | Admitting: Family Medicine

## 2021-01-25 ENCOUNTER — Other Ambulatory Visit: Payer: Self-pay

## 2021-01-25 ENCOUNTER — Ambulatory Visit (INDEPENDENT_AMBULATORY_CARE_PROVIDER_SITE_OTHER): Payer: Medicare HMO | Admitting: Family Medicine

## 2021-01-25 VITALS — BP 138/72 | HR 71 | Temp 98.4°F | Ht 67.0 in | Wt 236.0 lb

## 2021-01-25 DIAGNOSIS — N481 Balanitis: Secondary | ICD-10-CM | POA: Diagnosis not present

## 2021-01-25 DIAGNOSIS — I10 Essential (primary) hypertension: Secondary | ICD-10-CM | POA: Diagnosis not present

## 2021-01-25 DIAGNOSIS — T887XXA Unspecified adverse effect of drug or medicament, initial encounter: Secondary | ICD-10-CM | POA: Diagnosis not present

## 2021-01-25 DIAGNOSIS — E7800 Pure hypercholesterolemia, unspecified: Secondary | ICD-10-CM | POA: Diagnosis not present

## 2021-01-25 NOTE — Patient Instructions (Signed)
Genital Yeast Infection, Male In men, a genital yeast infection is a condition that causes soreness, swelling, and redness (inflammation) of the head of the penis (glans penis). A genital yeast infection can be spread through sexual contact, but it can also develop without sexual contact. If the infection is not treated properly, it is likely to come back. What are the causes? This condition is caused by a change in the normal balance of the yeast and bacteria that live on the skin. This change causes an overgrowth of yeast, which causes the inflammation. Many types of yeast can cause this infection, but Candida is the most common. What increases the risk? The following factors may make you more likely to develop this condition:  Taking antibiotics.  Having diabetes.  Being exposed to the infection by a sexual partner.  Being uncircumcised.  Having a weak body defense system (immune system).  Taking steroid medicines for a long time.  Having poor hygiene. What are the signs or symptoms? Symptoms of this condition include:  Itching of the groin and penis.  Dry, red, or cracked skin on the penis.  Swelling of the genital area.  Pain while urinating or difficulty urinating.  Thick, bad-smelling discharge on the penis. How is this diagnosed? This condition may be diagnosed based on:  Your medical history.  A physical exam. You may also have tests, such as:  Test of a sample of discharge from the penis.  Urine tests.  Blood tests. How is this treated? This condition is treated with:  Anti-fungal creams or medicines. Anti-fungal medicines may be prescribed by your health care provider or they may be available over-the-counter.  Self-care at home. For men who are not circumcised, circumcision may be recommended to control infections that return and are difficult to treat. Follow these instructions at home: Medicines  Take or apply over-the-counter and prescription  medicines only as told by your health care provider.  Take your anti-fungal medicine as told by your health care provider. Do not stop taking the medicine even if you start to feel better.   Self care  Wash your penis with soap and water every day. If you are not circumcised, pull back the foreskin to wash. Make sure to dry your penis completely after washing.  Wear breathable, cotton underwear.  Keep your underwear clean and dry. General instructions  Do not have sex until your health care provider has approved. Tell your sexual partner that you have a yeast infection. That person should go for treatment even if no symptoms are present.  If you have diabetes, keep your blood sugar levels within your target range.  Keep all follow-up visits as told by your health care provider. This is important. Contact a health care provider if you:  Have a fever.  Have symptoms that go away and then return.  Do not get better with treatment.  Have symptoms that get worse.  Have new symptoms. Get help right away if:  Your swelling and inflammation become so severe that you cannot urinate. Summary  In men, a genital yeast infection is a condition that causes soreness, swelling, and redness (inflammation) of the head of the penis (glans penis).  This condition is caused by a change in the normal balance of the yeast and bacteria that live on the skin. This change causes an overgrowth of yeast, which causes the inflammation.  A genital yeast infection usually spreads through sexual contact, but it can develop without sexual contact. For instance, you   may be more likely to develop this infection if you take antibiotics or steroids, have diabetes, are not circumcised, have a weak immune system, or have poor hygiene.  This condition is treated with anti-fungal cream or pills along with self-care at home. This information is not intended to replace advice given to you by your health care provider.  Make sure you discuss any questions you have with your health care provider. Document Revised: 11/28/2017 Document Reviewed: 11/28/2017 Elsevier Patient Education  2021 Elsevier Inc.  

## 2021-01-25 NOTE — Progress Notes (Signed)
New Patient Office Visit  Subjective:  Patient ID: Karl Albarran., male    DOB: March 04, 1946  Age: 75 y.o. MRN: [id number]  CC:  Chief Complaint  Patient presents with  . Establish Care    NP/establish care, concerns about urine incontinence, continued genital yeast.     HPI Karl Lawson. presents for establishment of care and follow-up of a problem that he has been having that includes urinary frequency with some urgency and a yeast infection.  He has a history of BPH that is been well treated with finasteride.  His urine flow is actually good and he is able to sleep through the night.  He is taking a GLT 2 inhibitor for his diabetes through his endocrinologist.  Last hemoglobin A1c 2 weeks ago was less than 8 he tells me.  He is accompanied by his wife who is a patient of mine also with Alzheimer's dementia.  History of hypertension controlled with losartan HCTZ.  History of elevated cholesterol controlled with simvastatin at 40 mg.  Past Medical History:  Diagnosis Date  . CAP (community acquired pneumonia) 1968   [place] ([facility])  . Cataract   . COPD (chronic obstructive pulmonary disease) ([place])   . Diabetes mellitus   . Diverticulosis of colon 11/19/12   left colon, Dr Carlean Purl  . GERD (gastroesophageal reflux disease)   . History of adenomatous polyps of colon - probable attenuated polyposis 07/17/2008   2004: 2 polyps max 8 mm 1 lost and 1 infammatory 2010: 7 polyps TV and tubular adenomas max 6 mm 11/19/2012 :4 polyps; max 1 cm removed; all adenomas 01/05/2017 8 polyps max 10 mm adenomas recall 2021    . Hyperlipidemia   . Hypertension   . Personal history of adenomatous colonic polyps 07/17/2008   2004 2 polyps max 8 mm (adenomas) 2010 7 polyps TV and tubular adenomas max 6 mm 11/19/2012      Past Surgical History:  Procedure Laterality Date  . APPENDECTOMY  1959  . CATARACT EXTRACTION Right   . COLONOSCOPY  multiple   adenomatous polyps, Dr Carlean Purl  .  ESOPHAGOGASTRODUODENOSCOPY  multiple  . INGUINAL HERNIA REPAIR Left 1997  . TONSILLECTOMY      Family History  Problem Relation Age of Onset  . Heart disease Father        CHF  . Stroke Father 77  . Alzheimer's disease Mother   . Pulmonary embolism Brother   . Diabetes Brother   . Heart disease Paternal Grandfather   . Asthma Neg Hx   . Colon cancer Neg Hx   . Esophageal cancer Neg Hx   . Pancreatic cancer Neg Hx   . Prostate cancer Neg Hx   . Rectal cancer Neg Hx   . Stomach cancer Neg Hx     Social History   Socioeconomic History  . Marital status: Married    Spouse name: Not on file  . Number of children: 2  . Years of education: 40  . Highest education level: Not on file  Occupational History  . Occupation: Retired - Mostly  Tobacco Use  . Smoking status: Former Smoker    Types: Cigarettes, E-cigarettes    Quit date: 10/31/2012    Years since quitting: 8.2  . Smokeless tobacco: Never Used  . Tobacco comment: smoked ages [age], up to 1 ppd . Smoked 2-3 cigars/ day [age]  Vaping Use  . Vaping Use: Every day  Substance and Sexual Activity  .  Alcohol use: Yes    Comment: occasionally  . Drug use: No  . Sexual activity: Not on file  Other Topics Concern  . Not on file  Social History Narrative   Fun: Building services engineer, working in the yard and planting in the garden.    Social Determinants of Health   Financial Resource Strain: Not on file  Food Insecurity: Not on file  Transportation Needs: Not on file  Physical Activity: Not on file  Stress: Not on file  Social Connections: Not on file  Intimate Partner Violence: Not on file    ROS Review of Systems  Constitutional: Negative.   HENT: Negative.   Eyes: Negative for photophobia and visual disturbance.  Respiratory: Negative.   Cardiovascular: Negative.   Gastrointestinal: Negative.   Endocrine: Negative for polyphagia and polyuria.  Genitourinary: Positive for frequency and penile swelling. Negative for  difficulty urinating, hematuria and urgency.  Musculoskeletal: Negative for gait problem and myalgias.  Skin: Positive for rash. Negative for pallor.  Allergic/Immunologic: Negative for immunocompromised state.  Neurological: Negative for light-headedness.  Hematological: Does not bruise/bleed easily.  Psychiatric/Behavioral: Negative.     Objective:   Today's Vitals: BP 138/72   Pulse 71   Temp 98.4 F (36.9 C) (Temporal)   Ht _0  (1.702 m)   Wt 236 lb (107 kg)   SpO2 96%   BMI 36.96 kg/m   Physical Exam Vitals and nursing note reviewed.  Constitutional:      General: He is not in acute distress.    Appearance: Normal appearance. He is not ill-appearing, toxic-appearing or diaphoretic.  HENT:     Head: Normocephalic and atraumatic.     Right Ear: Tympanic membrane, ear canal and external ear normal.     Left Ear: Tympanic membrane, ear canal and external ear normal.     Mouth/Throat:     Pharynx: Oropharynx is clear. No oropharyngeal exudate.  Eyes:     Extraocular Movements: Extraocular movements intact.     Conjunctiva/sclera: Conjunctivae normal.     Pupils: Pupils are equal, round, and reactive to light.  Cardiovascular:     Rate and Rhythm: Normal rate and regular rhythm.     Pulses: Normal pulses.     Heart sounds: Normal heart sounds.  Pulmonary:     Effort: Pulmonary effort is normal.     Breath sounds: Normal breath sounds.  Abdominal:     General: Bowel sounds are normal.  Genitourinary:    Penis: Uncircumcised. Paraphimosis and erythema present. No hypospadias, tenderness, discharge, swelling or lesions.      Testes: Normal.  Musculoskeletal:     Cervical back: No rigidity or tenderness.  Lymphadenopathy:     Cervical: No cervical adenopathy.  Skin:    General: Skin is warm and dry.  Neurological:     Mental Status: He is alert and oriented to person, place, and time.  Psychiatric:        Mood and Affect: Mood normal.        Behavior: Behavior  normal.     Assessment & Plan:   Problem List Items Addressed This Visit      Cardiovascular and Mediastinum   Essential hypertension - Primary     Genitourinary   Balanitis     Other   Medication side effect   Pure hypercholesterolemia      Outpatient Encounter Medications as of 01/25/2021  Medication Sig  . amLODipine (NORVASC) 5 MG tablet Take 1 tablet by mouth daily.  Marland Kitchen  aspirin EC 81 MG tablet Take 81 mg by mouth daily.  . Blood Glucose Monitoring Suppl (ONE TOUCH ULTRA 2) w/Device KIT Check blood glucose 3 times daily as directed  . finasteride (PROSCAR) 5 MG tablet Take 5 mg by mouth daily.  Marland Kitchen glucose blood test strip 3 (three) times daily.  . insulin lispro protamine-lispro (HUMALOG 75/25 MIX) (75-25) 100 UNIT/ML SUSP injection Inject 40 Units into the skin 2 (two) times daily.  Marland Kitchen JARDIANCE 10 MG TABS tablet Take 10 mg by mouth daily.  . metFORMIN (GLUCOPHAGE) 1000 MG tablet TAKE 1 TABLET TWICE A DAY WITH MEALS  . Multiple Vitamins-Minerals (MULTIVITAMIN WITH MINERALS) tablet Take 1 tablet by mouth daily.  Marland Kitchen omeprazole (PRILOSEC) 40 MG capsule Take 1 capsule (40 mg total) by mouth daily.  Glory Rosebush ULTRA test strip   . simvastatin (ZOCOR) 40 MG tablet Take 1 tablet (40 mg total) by mouth at bedtime.  . fluticasone (FLONASE) 50 MCG/ACT nasal spray Place 2 sprays into both nostrils daily.  Marland Kitchen losartan-hydrochlorothiazide (HYZAAR) 100-25 MG per tablet Take 1 tablet by mouth daily.  Marland Kitchen umeclidinium-vilanterol (ANORO ELLIPTA) 62.5-25 MCG/INH AEPB Inhale 1 puff into the lungs daily.  . [DISCONTINUED] albuterol (PROAIR HFA) 108 (90 Base) MCG/ACT inhaler Inhale 2 puffs into the lungs every 6 (six) hours as needed for wheezing or shortness of breath. (Patient not taking: Reported on 01/25/2021)   No facility-administered encounter medications on file as of 01/25/2021.    Follow-up: Return in about 3 months (around 04/27/2021).  Suggested that he follow back up with endocrinology  and asked that the Jardiance be discontinued.  Believe that his symptoms are medication side effect.  Libby Maw, MD

## 2021-02-02 DIAGNOSIS — H2512 Age-related nuclear cataract, left eye: Secondary | ICD-10-CM | POA: Diagnosis not present

## 2021-02-02 DIAGNOSIS — E119 Type 2 diabetes mellitus without complications: Secondary | ICD-10-CM | POA: Diagnosis not present

## 2021-02-02 DIAGNOSIS — H353132 Nonexudative age-related macular degeneration, bilateral, intermediate dry stage: Secondary | ICD-10-CM | POA: Diagnosis not present

## 2021-02-02 DIAGNOSIS — H25012 Cortical age-related cataract, left eye: Secondary | ICD-10-CM | POA: Diagnosis not present

## 2021-02-02 DIAGNOSIS — H18452 Nodular corneal degeneration, left eye: Secondary | ICD-10-CM | POA: Diagnosis not present

## 2021-02-26 DIAGNOSIS — N5201 Erectile dysfunction due to arterial insufficiency: Secondary | ICD-10-CM | POA: Diagnosis not present

## 2021-02-26 DIAGNOSIS — N3943 Post-void dribbling: Secondary | ICD-10-CM | POA: Diagnosis not present

## 2021-02-26 DIAGNOSIS — N401 Enlarged prostate with lower urinary tract symptoms: Secondary | ICD-10-CM | POA: Diagnosis not present

## 2021-02-26 DIAGNOSIS — R972 Elevated prostate specific antigen [PSA]: Secondary | ICD-10-CM | POA: Diagnosis not present

## 2021-02-26 DIAGNOSIS — N481 Balanitis: Secondary | ICD-10-CM | POA: Diagnosis not present

## 2021-05-05 DIAGNOSIS — H26491 Other secondary cataract, right eye: Secondary | ICD-10-CM | POA: Diagnosis not present

## 2021-05-05 DIAGNOSIS — Z961 Presence of intraocular lens: Secondary | ICD-10-CM | POA: Diagnosis not present

## 2021-05-05 DIAGNOSIS — H25812 Combined forms of age-related cataract, left eye: Secondary | ICD-10-CM | POA: Diagnosis not present

## 2021-05-05 DIAGNOSIS — H18452 Nodular corneal degeneration, left eye: Secondary | ICD-10-CM | POA: Diagnosis not present

## 2021-05-07 DIAGNOSIS — Z794 Long term (current) use of insulin: Secondary | ICD-10-CM | POA: Diagnosis not present

## 2021-05-07 DIAGNOSIS — E785 Hyperlipidemia, unspecified: Secondary | ICD-10-CM | POA: Diagnosis not present

## 2021-05-07 DIAGNOSIS — E1142 Type 2 diabetes mellitus with diabetic polyneuropathy: Secondary | ICD-10-CM | POA: Diagnosis not present

## 2021-05-07 DIAGNOSIS — K219 Gastro-esophageal reflux disease without esophagitis: Secondary | ICD-10-CM | POA: Diagnosis not present

## 2021-05-07 DIAGNOSIS — N529 Male erectile dysfunction, unspecified: Secondary | ICD-10-CM | POA: Diagnosis not present

## 2021-05-07 DIAGNOSIS — R32 Unspecified urinary incontinence: Secondary | ICD-10-CM | POA: Diagnosis not present

## 2021-05-07 DIAGNOSIS — Z6835 Body mass index (BMI) 35.0-35.9, adult: Secondary | ICD-10-CM | POA: Diagnosis not present

## 2021-05-07 DIAGNOSIS — N400 Enlarged prostate without lower urinary tract symptoms: Secondary | ICD-10-CM | POA: Diagnosis not present

## 2021-05-07 DIAGNOSIS — I10 Essential (primary) hypertension: Secondary | ICD-10-CM | POA: Diagnosis not present

## 2021-06-14 DIAGNOSIS — E1165 Type 2 diabetes mellitus with hyperglycemia: Secondary | ICD-10-CM | POA: Diagnosis not present

## 2021-06-14 DIAGNOSIS — E7800 Pure hypercholesterolemia, unspecified: Secondary | ICD-10-CM | POA: Diagnosis not present

## 2021-06-21 DIAGNOSIS — I10 Essential (primary) hypertension: Secondary | ICD-10-CM | POA: Diagnosis not present

## 2021-06-21 DIAGNOSIS — E1165 Type 2 diabetes mellitus with hyperglycemia: Secondary | ICD-10-CM | POA: Diagnosis not present

## 2021-06-21 DIAGNOSIS — Z794 Long term (current) use of insulin: Secondary | ICD-10-CM | POA: Diagnosis not present

## 2021-06-21 DIAGNOSIS — E669 Obesity, unspecified: Secondary | ICD-10-CM | POA: Diagnosis not present

## 2021-06-21 DIAGNOSIS — E7800 Pure hypercholesterolemia, unspecified: Secondary | ICD-10-CM | POA: Diagnosis not present

## 2021-07-14 DIAGNOSIS — H43821 Vitreomacular adhesion, right eye: Secondary | ICD-10-CM | POA: Diagnosis not present

## 2021-07-14 DIAGNOSIS — H353132 Nonexudative age-related macular degeneration, bilateral, intermediate dry stage: Secondary | ICD-10-CM | POA: Diagnosis not present

## 2021-07-14 DIAGNOSIS — E113293 Type 2 diabetes mellitus with mild nonproliferative diabetic retinopathy without macular edema, bilateral: Secondary | ICD-10-CM | POA: Diagnosis not present

## 2021-08-23 DIAGNOSIS — Z7952 Long term (current) use of systemic steroids: Secondary | ICD-10-CM | POA: Diagnosis not present

## 2021-08-23 DIAGNOSIS — E113213 Type 2 diabetes mellitus with mild nonproliferative diabetic retinopathy with macular edema, bilateral: Secondary | ICD-10-CM | POA: Diagnosis not present

## 2021-08-23 DIAGNOSIS — H547 Unspecified visual loss: Secondary | ICD-10-CM | POA: Diagnosis not present

## 2021-08-23 DIAGNOSIS — Z961 Presence of intraocular lens: Secondary | ICD-10-CM | POA: Diagnosis not present

## 2021-08-23 DIAGNOSIS — Z794 Long term (current) use of insulin: Secondary | ICD-10-CM | POA: Diagnosis not present

## 2021-08-23 DIAGNOSIS — H18452 Nodular corneal degeneration, left eye: Secondary | ICD-10-CM | POA: Diagnosis not present

## 2021-08-23 DIAGNOSIS — Z79899 Other long term (current) drug therapy: Secondary | ICD-10-CM | POA: Diagnosis not present

## 2021-08-23 DIAGNOSIS — H25812 Combined forms of age-related cataract, left eye: Secondary | ICD-10-CM | POA: Diagnosis not present

## 2021-08-23 DIAGNOSIS — E113553 Type 2 diabetes mellitus with stable proliferative diabetic retinopathy, bilateral: Secondary | ICD-10-CM | POA: Diagnosis not present

## 2021-08-23 DIAGNOSIS — H26491 Other secondary cataract, right eye: Secondary | ICD-10-CM | POA: Diagnosis not present

## 2021-08-23 DIAGNOSIS — Z7984 Long term (current) use of oral hypoglycemic drugs: Secondary | ICD-10-CM | POA: Diagnosis not present

## 2021-08-23 DIAGNOSIS — H35371 Puckering of macula, right eye: Secondary | ICD-10-CM | POA: Diagnosis not present

## 2021-09-07 DIAGNOSIS — E113293 Type 2 diabetes mellitus with mild nonproliferative diabetic retinopathy without macular edema, bilateral: Secondary | ICD-10-CM | POA: Diagnosis not present

## 2021-09-07 DIAGNOSIS — H2512 Age-related nuclear cataract, left eye: Secondary | ICD-10-CM | POA: Diagnosis not present

## 2021-09-07 DIAGNOSIS — H353132 Nonexudative age-related macular degeneration, bilateral, intermediate dry stage: Secondary | ICD-10-CM | POA: Diagnosis not present

## 2021-09-07 DIAGNOSIS — D3131 Benign neoplasm of right choroid: Secondary | ICD-10-CM | POA: Diagnosis not present

## 2021-09-22 DIAGNOSIS — B379 Candidiasis, unspecified: Secondary | ICD-10-CM | POA: Diagnosis not present

## 2021-09-22 DIAGNOSIS — E669 Obesity, unspecified: Secondary | ICD-10-CM | POA: Diagnosis not present

## 2021-09-22 DIAGNOSIS — E1165 Type 2 diabetes mellitus with hyperglycemia: Secondary | ICD-10-CM | POA: Diagnosis not present

## 2021-09-22 DIAGNOSIS — I10 Essential (primary) hypertension: Secondary | ICD-10-CM | POA: Diagnosis not present

## 2021-09-22 DIAGNOSIS — Z794 Long term (current) use of insulin: Secondary | ICD-10-CM | POA: Diagnosis not present

## 2021-09-22 DIAGNOSIS — E7800 Pure hypercholesterolemia, unspecified: Secondary | ICD-10-CM | POA: Diagnosis not present

## 2021-09-23 DIAGNOSIS — E1165 Type 2 diabetes mellitus with hyperglycemia: Secondary | ICD-10-CM | POA: Diagnosis not present

## 2021-10-23 DIAGNOSIS — E1165 Type 2 diabetes mellitus with hyperglycemia: Secondary | ICD-10-CM | POA: Diagnosis not present

## 2021-10-25 DIAGNOSIS — Z7984 Long term (current) use of oral hypoglycemic drugs: Secondary | ICD-10-CM | POA: Diagnosis not present

## 2021-10-25 DIAGNOSIS — Z7952 Long term (current) use of systemic steroids: Secondary | ICD-10-CM | POA: Diagnosis not present

## 2021-10-25 DIAGNOSIS — Z961 Presence of intraocular lens: Secondary | ICD-10-CM | POA: Diagnosis not present

## 2021-10-25 DIAGNOSIS — H25812 Combined forms of age-related cataract, left eye: Secondary | ICD-10-CM | POA: Diagnosis not present

## 2021-10-25 DIAGNOSIS — H18452 Nodular corneal degeneration, left eye: Secondary | ICD-10-CM | POA: Diagnosis not present

## 2021-10-25 DIAGNOSIS — E113553 Type 2 diabetes mellitus with stable proliferative diabetic retinopathy, bilateral: Secondary | ICD-10-CM | POA: Diagnosis not present

## 2021-10-25 DIAGNOSIS — H26491 Other secondary cataract, right eye: Secondary | ICD-10-CM | POA: Diagnosis not present

## 2021-11-23 DIAGNOSIS — E1165 Type 2 diabetes mellitus with hyperglycemia: Secondary | ICD-10-CM | POA: Diagnosis not present

## 2021-12-13 ENCOUNTER — Ambulatory Visit (INDEPENDENT_AMBULATORY_CARE_PROVIDER_SITE_OTHER): Payer: Medicare HMO | Admitting: Family Medicine

## 2021-12-13 ENCOUNTER — Other Ambulatory Visit: Payer: Self-pay

## 2021-12-13 ENCOUNTER — Encounter: Payer: Self-pay | Admitting: Family Medicine

## 2021-12-13 VITALS — BP 136/70 | HR 89 | Temp 97.4°F | Ht 67.0 in | Wt 244.8 lb

## 2021-12-13 DIAGNOSIS — R69 Illness, unspecified: Secondary | ICD-10-CM | POA: Diagnosis not present

## 2021-12-13 DIAGNOSIS — F418 Other specified anxiety disorders: Secondary | ICD-10-CM | POA: Diagnosis not present

## 2021-12-13 DIAGNOSIS — E782 Mixed hyperlipidemia: Secondary | ICD-10-CM

## 2021-12-13 DIAGNOSIS — K4090 Unilateral inguinal hernia, without obstruction or gangrene, not specified as recurrent: Secondary | ICD-10-CM | POA: Diagnosis not present

## 2021-12-13 DIAGNOSIS — I10 Essential (primary) hypertension: Secondary | ICD-10-CM | POA: Diagnosis not present

## 2021-12-13 NOTE — Progress Notes (Signed)
Established Patient Office Visit  Subjective:  Patient ID: Karl Lawson., male    DOB: [birth year]  Age: 76 y.o. MRN: [id number]  CC:  Chief Complaint  Patient presents with   Hernia    Hernia right groan area becoming worse x 2 weeks.     HPI Wlliam Lawson. presents for evaluation of possible inguinal hernia.  Status post hernia repair on the left years ago.  He has experienced a mass that has been painful but reducible in the right inguinal area.  Lost to follow-up for hypertension cholesterol.  He is the sole caregiver of his wife who suffers from advanced Alzheimer's dementia.  He often has to help her out of the bed from sitting in a chair.  Continues to see endocrinology for diabetes control.  Continues follow-up with urology for BPH.  Ongoing urinary incontinence.  He wears a depends.  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)   Cataract    COPD (chronic obstructive pulmonary disease) ([place])    Diabetes mellitus    Diverticulosis of colon 11/19/12   left colon, Dr Carlean Purl   GERD (gastroesophageal reflux disease)    History of adenomatous polyps of colon - probable attenuated polyposis 07/17/2008   2004: 2 polyps max 8 mm 1 lost and 1 infammatory 2010: 7 polyps TV and tubular adenomas max 6 mm 11/19/2012 :4 polyps; max 1 cm removed; all adenomas 01/05/2017 8 polyps max 10 mm adenomas recall 2021     Hyperlipidemia    Hypertension    Personal history of adenomatous colonic polyps 07/17/2008   2004 2 polyps max 8 mm (adenomas) 2010 7 polyps TV and tubular adenomas max 6 mm 11/19/2012      Past Surgical History:  Procedure Laterality Date   APPENDECTOMY  1959   CATARACT EXTRACTION Right    COLONOSCOPY  multiple   adenomatous polyps, Dr Carlean Purl   ESOPHAGOGASTRODUODENOSCOPY  multiple   INGUINAL HERNIA REPAIR Left 1997   TONSILLECTOMY      Family History  Problem Relation Age of Onset   Heart disease Father         CHF   Stroke Father 28   Alzheimer's disease Mother    Pulmonary embolism Brother    Diabetes Brother    Heart disease Paternal Grandfather    Asthma Neg Hx    Colon cancer Neg Hx    Esophageal cancer Neg Hx    Pancreatic cancer Neg Hx    Prostate cancer Neg Hx    Rectal cancer Neg Hx    Stomach cancer Neg Hx     Social History   Socioeconomic History   Marital status: Married    Spouse name: Not on file   Number of children: 2   Years of education: 14   Highest education level: Not on file  Occupational History   Occupation: Retired - Mostly  Tobacco Use   Smoking status: Former    Types: Cigarettes, E-cigarettes    Quit date: 10/31/2012    Years since quitting: 9.1   Smokeless tobacco: Never   Tobacco comments:    smoked ages [age], up to 1 ppd . Smoked 2-3 cigars/ day [age]  Vaping Use   Vaping Use: Every day  Substance and Sexual Activity   Alcohol use: Yes    Comment: occasionally   Drug use: No   Sexual activity: Not on file  Other  Topics Concern   Not on file  Social History Narrative   Fun: Building services engineer, working in the yard and planting in the garden.    Social Determinants of Health   Financial Resource Strain: Not on file  Food Insecurity: Not on file  Transportation Needs: Not on file  Physical Activity: Not on file  Stress: Not on file  Social Connections: Not on file  Intimate Partner Violence: Not on file    Outpatient Medications Prior to Visit  Medication Sig Dispense Refill   amLODipine (NORVASC) 5 MG tablet Take 1 tablet by mouth daily.     aspirin EC 81 MG tablet Take 81 mg by mouth daily.     Blood Glucose Monitoring Suppl (ONE TOUCH ULTRA 2) w/Device KIT Check blood glucose 3 times daily as directed     finasteride (PROSCAR) 5 MG tablet Take 5 mg by mouth daily.     glucose blood test strip 3 (three) times daily.     insulin lispro protamine-lispro (HUMALOG 75/25 MIX) (75-25) 100 UNIT/ML SUSP injection Inject 40 Units into the  skin 2 (two) times daily.     JARDIANCE 10 MG TABS tablet Take 10 mg by mouth daily.     losartan-hydrochlorothiazide (HYZAAR) 100-25 MG per tablet Take 1 tablet by mouth daily.     metFORMIN (GLUCOPHAGE) 1000 MG tablet TAKE 1 TABLET TWICE A DAY WITH MEALS 60 tablet 0   Multiple Vitamins-Minerals (MULTIVITAMIN WITH MINERALS) tablet Take 1 tablet by mouth daily.     omeprazole (PRILOSEC) 40 MG capsule Take 1 capsule (40 mg total) by mouth daily. 90 capsule 3   ONETOUCH ULTRA test strip      simvastatin (ZOCOR) 40 MG tablet Take 1 tablet (40 mg total) by mouth at bedtime. 90 tablet 3   No facility-administered medications prior to visit.    No Known Allergies  ROS Review of Systems  Constitutional: Negative.   HENT: Negative.    Eyes:  Negative for photophobia and visual disturbance.  Respiratory: Negative.    Cardiovascular: Negative.   Gastrointestinal:  Positive for abdominal pain. Negative for abdominal distention, anal bleeding, blood in stool, constipation, diarrhea, nausea, rectal pain and vomiting.  Genitourinary:  Positive for difficulty urinating, frequency and urgency.  Musculoskeletal:  Negative for gait problem and joint swelling.  Neurological:  Negative for speech difficulty and weakness.     Objective:    Physical Exam Vitals and nursing note reviewed.  Constitutional:      General: He is not in acute distress.    Appearance: Normal appearance. He is not ill-appearing, toxic-appearing or diaphoretic.  HENT:     Head: Normocephalic and atraumatic.     Right Ear: Tympanic membrane, ear canal and external ear normal.     Left Ear: Tympanic membrane, ear canal and external ear normal.     Mouth/Throat:     Mouth: Mucous membranes are moist.     Pharynx: Oropharynx is clear. No oropharyngeal exudate or posterior oropharyngeal erythema.  Eyes:     General: No scleral icterus.       Right eye: No discharge.        Left eye: No discharge.     Extraocular Movements:  Extraocular movements intact.     Conjunctiva/sclera: Conjunctivae normal.     Pupils: Pupils are equal, round, and reactive to light.  Cardiovascular:     Rate and Rhythm: Normal rate and regular rhythm.  Pulmonary:     Effort: Pulmonary effort is normal.  Breath sounds: Normal breath sounds.  °Abdominal:  °   General: Bowel sounds are normal. There is no distension.  °   Palpations: Abdomen is soft. There is no mass.  °   Tenderness: There is no abdominal tenderness. There is no guarding or rebound.  °   Hernia: A hernia is present. Hernia is present in the right inguinal area. There is no hernia in the left inguinal area.  °Genitourinary: °   Penis: No tenderness, discharge, swelling or lesions.   °   Testes:     °   Right: Mass, tenderness or swelling not present. Right testis is descended.     °   Left: Mass, tenderness or swelling not present. Left testis is descended.  °   Epididymis:  °   Right: Not inflamed or enlarged.  °   Left: Not inflamed or enlarged.  °Musculoskeletal:  °   Cervical back: No rigidity or tenderness.  °Lymphadenopathy:  °   Cervical: No cervical adenopathy.  °   Lower Body: No right inguinal adenopathy. No left inguinal adenopathy.  °Skin: °   General: Skin is warm and dry.  °Neurological:  °   Mental Status: He is alert and oriented to person, place, and time.  °Psychiatric:     °   Mood and Affect: Mood normal.     °   Behavior: Behavior normal.  ° ° °BP 136/70 (BP Location: Left Arm, Patient Position: Sitting, Cuff Size: Normal)    Pulse 89    Temp (!) 97.4 °F (36.3 °C) (Temporal)    Ht 5' 7" (1.702 m)    Wt 244 lb 12.8 oz (111 kg)    SpO2 95%    BMI 38.34 kg/m²  °Wt Readings from Last 3 Encounters:  °12/13/21 244 lb 12.8 oz (111 kg)  °01/25/21 236 lb (107 kg)  °05/06/20 246 lb (111.6 kg)  ° ° ° °Health Maintenance Due  °Topic Date Due  ° Zoster Vaccines- Shingrix (1 of 2) Never done  ° FOOT EXAM  09/06/2017  ° HEMOGLOBIN A1C  01/31/2019  ° COLONOSCOPY (Pts 45-49yrs  Insurance coverage will need to be confirmed)  01/06/2020  ° ° °There are no preventive care reminders to display for this patient. ° °Lab Results  °Component Value Date  ° TSH 1.21 07/30/2019  ° °Lab Results  °Component Value Date  ° WBC 7.6 07/30/2019  ° HGB 14.6 07/30/2019  ° HCT 44.8 07/30/2019  ° MCV 80.7 07/30/2019  ° PLT 269.0 07/30/2019  ° °Lab Results  °Component Value Date  ° NA 139 07/30/2019  ° K 3.7 07/30/2019  ° CO2 25 07/30/2019  ° GLUCOSE 98 07/30/2019  ° BUN 15 07/30/2019  ° CREATININE 0.92 07/30/2019  ° BILITOT 0.5 07/30/2019  ° ALKPHOS 107 07/30/2019  ° AST 14 07/30/2019  ° ALT 13 07/30/2019  ° PROT 6.8 07/30/2019  ° ALBUMIN 4.1 07/30/2019  ° CALCIUM 10.3 07/30/2019  ° GFR 80.46 07/30/2019  ° °Lab Results  °Component Value Date  ° CHOL 108 07/30/2019  ° °Lab Results  °Component Value Date  ° HDL 36.60 (L) 07/30/2019  ° °Lab Results  °Component Value Date  ° LDLCALC 51 07/30/2019  ° °Lab Results  °Component Value Date  ° TRIG 101.0 07/30/2019  ° °Lab Results  °Component Value Date  ° CHOLHDL 3 07/30/2019  ° °Lab Results  °Component Value Date  ° HGBA1C 9.0 (H) 05/07/2009  ° ° °  °Assessment & Plan:  ° °  Problem List Items Addressed This Visit   ° °  ° Cardiovascular and Mediastinum  ° Essential hypertension - Primary  ° Relevant Orders  ° CBC  ° Comprehensive metabolic panel  °  ° Other  ° Mixed hyperlipidemia  ° Relevant Orders  ° Comprehensive metabolic panel  ° LDL cholesterol, direct  ° Lipid panel  ° Unilateral inguinal hernia without obstruction or gangrene  ° Relevant Orders  ° Ambulatory referral to General Surgery  ° ° °No orders of the defined types were placed in this encounter. ° ° °Follow-up: Return in about 6 months (around 06/12/2022), or Return fasting for ordered blood work..  °Discussed the need for closer follow-up with patient. ° ° Alfred , MD °

## 2021-12-14 ENCOUNTER — Other Ambulatory Visit (INDEPENDENT_AMBULATORY_CARE_PROVIDER_SITE_OTHER): Payer: Medicare HMO

## 2021-12-14 DIAGNOSIS — E782 Mixed hyperlipidemia: Secondary | ICD-10-CM | POA: Diagnosis not present

## 2021-12-14 DIAGNOSIS — I10 Essential (primary) hypertension: Secondary | ICD-10-CM | POA: Diagnosis not present

## 2021-12-14 LAB — LIPID PANEL
Cholesterol: 114 mg/dL (ref 0–200)
HDL: 46.4 mg/dL (ref >39.00)
LDL Cholesterol: 50 mg/dL (ref 0–99)
NonHDL: 67.85
Total CHOL/HDL Ratio: 2
Triglycerides: 88 mg/dL (ref 0.0–149.0)
VLDL: 17.6 mg/dL (ref 0.0–40.0)

## 2021-12-14 LAB — CBC
HCT: 45.7 % (ref 39.0–52.0)
Hemoglobin: 14.7 g/dL (ref 13.0–17.0)
MCHC: 32.2 g/dL (ref 30.0–36.0)
MCV: 79.6 fl (ref 78.0–100.0)
Platelets: 228 10*3/uL (ref 150.0–400.0)
RBC: 5.75 Mil/uL (ref 4.22–5.81)
RDW: 16.7 % — ABNORMAL HIGH (ref 11.5–15.5)
WBC: 7.7 10*3/uL (ref 4.0–10.5)

## 2021-12-14 LAB — COMPREHENSIVE METABOLIC PANEL
ALT: 13 U/L (ref 0–53)
AST: 15 U/L (ref 0–37)
Albumin: 4.7 g/dL (ref 3.5–5.2)
Alkaline Phosphatase: 92 U/L (ref 39–117)
BUN: 19 mg/dL (ref 6–23)
CO2: 32 mEq/L (ref 19–32)
Calcium: 10.8 mg/dL — ABNORMAL HIGH (ref 8.4–10.5)
Chloride: 105 mEq/L (ref 96–112)
Creatinine, Ser: 1.1 mg/dL (ref 0.40–1.50)
GFR: 65.35 mL/min (ref >60.00)
Glucose, Bld: 105 mg/dL — ABNORMAL HIGH (ref 70–99)
Potassium: 4.3 mEq/L (ref 3.5–5.1)
Sodium: 142 mEq/L (ref 135–145)
Total Bilirubin: 1 mg/dL (ref 0.2–1.2)
Total Protein: 7 g/dL (ref 6.0–8.3)

## 2021-12-14 LAB — LDL CHOLESTEROL, DIRECT: Direct LDL: 55 mg/dL

## 2021-12-24 DIAGNOSIS — E1165 Type 2 diabetes mellitus with hyperglycemia: Secondary | ICD-10-CM | POA: Diagnosis not present

## 2021-12-31 DIAGNOSIS — K4020 Bilateral inguinal hernia, without obstruction or gangrene, not specified as recurrent: Secondary | ICD-10-CM | POA: Diagnosis not present

## 2022-01-21 DIAGNOSIS — E1165 Type 2 diabetes mellitus with hyperglycemia: Secondary | ICD-10-CM | POA: Diagnosis not present

## 2022-01-31 ENCOUNTER — Telehealth: Payer: Self-pay | Admitting: Family Medicine

## 2022-01-31 NOTE — Telephone Encounter (Signed)
Left message for patient to call back and schedule Medicare Annual Wellness Visit (AWV) in office.  ? ?If not able to come in office, please offer to do virtually or by telephone.  Left office number and my jabber [phone]. ? ?Last AWV:04/16/2015 ? ?Please schedule at anytime with Nurse Health Advisor. ?  ?

## 2022-02-03 ENCOUNTER — Encounter (HOSPITAL_COMMUNITY): Payer: Self-pay | Admitting: Physician Assistant

## 2022-02-03 ENCOUNTER — Ambulatory Visit (INDEPENDENT_AMBULATORY_CARE_PROVIDER_SITE_OTHER): Payer: Medicare HMO

## 2022-02-03 ENCOUNTER — Ambulatory Visit (HOSPITAL_COMMUNITY)
Admission: EM | Admit: 2022-02-03 | Discharge: 2022-02-03 | Disposition: A | Discharge status: Home / Self Care | Payer: Medicare HMO | Attending: Physician Assistant | Admitting: Physician Assistant

## 2022-02-03 DIAGNOSIS — J189 Pneumonia, unspecified organism: Secondary | ICD-10-CM

## 2022-02-03 DIAGNOSIS — J441 Chronic obstructive pulmonary disease with (acute) exacerbation: Secondary | ICD-10-CM

## 2022-02-03 DIAGNOSIS — R059 Cough, unspecified: Secondary | ICD-10-CM | POA: Diagnosis not present

## 2022-02-03 MED ORDER — ALBUTEROL SULFATE HFA 108 (90 BASE) MCG/ACT IN AERS
INHALATION_SPRAY | RESPIRATORY_TRACT | Status: AC
Start: 2022-02-03 — End: ?
  Filled 2022-02-03: qty 6.7

## 2022-02-03 MED ORDER — BENZONATATE 100 MG PO CAPS: 100.0000 mg | ORAL_CAPSULE | Freq: Three times a day (TID) | ORAL | 0 refills | Status: DC

## 2022-02-03 MED ORDER — ALBUTEROL SULFATE HFA 108 (90 BASE) MCG/ACT IN AERS
2.0000 | INHALATION_SPRAY | Freq: Once | RESPIRATORY_TRACT | Status: AC
  Administered 2022-02-03: 2 via RESPIRATORY_TRACT

## 2022-02-03 MED ORDER — DOXYCYCLINE HYCLATE 100 MG PO CAPS: 100.0000 mg | ORAL_CAPSULE | Freq: Two times a day (BID) | ORAL | 0 refills | Status: DC

## 2022-02-03 NOTE — ED Provider Notes (Signed)
?[facility] ? ? ? ?CSN: [id number] ?Arrival date & time: 02/03/22  4825 ? ? ?  ? ?History   ?Chief Complaint ?Chief Complaint  ?Patient presents with  ? Fever  ? Cough  ? ? ?HPI ?Karl Lawson. is a 76 y.o. male.  ? ?Patient presents today with a 1 week history of worsening cough.  He reports associated shortness of breath, subjective fever, fatigue, malaise.  He denies any chest pain, nausea, vomiting, weakness, confusion.  He does have a history of COPD and has been using albuterol that he has available at home with improvement but not resolution of symptoms.  He has been taking cough medicine and Tylenol without improvement of symptoms.  Denies any recent antibiotics.  He has not had COVID in the past.  He is up-to-date on COVID vaccinations.  Denies any known sick contacts.  He is a former smoker but does occasionally vape.  He has a history of diabetes but reports blood sugars are adequately controlled according to his freestyle libre. ? ? ?Past Medical History:  ?Diagnosis Date  ? CAP (community acquired pneumonia) 1968  ? [place] ([place])  ? Cataract   ? COPD (chronic obstructive pulmonary disease) ([place])   ? Diabetes mellitus   ? Diverticulosis of colon 11/19/12  ? left colon, Dr Carlean Purl  ? GERD (gastroesophageal reflux disease)   ? History of adenomatous polyps of colon - probable attenuated polyposis 07/17/2008  ? 2004: 2 polyps max 8 mm 1 lost and 1 infammatory 2010: 7 polyps TV and tubular adenomas max 6 mm 11/19/2012 :4 polyps; max 1 cm removed; all adenomas 01/05/2017 8 polyps max 10 mm adenomas recall 2021    ? Hyperlipidemia   ? Hypertension   ? Personal history of adenomatous colonic polyps 07/17/2008  ? 2004 2 polyps max 8 mm (adenomas) 2010 7 polyps TV and tubular adenomas max 6 mm 11/19/2012    ? ? ?Patient Active Problem List  ? Diagnosis Date Noted  ? Unilateral inguinal hernia without obstruction or gangrene 12/13/2021  ? Medication side effect 01/25/2021  ? Balanitis 01/25/2021  ?  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, bilateral ([place]) 07/29/2019  ? COPD (chronic obstructive pulmonary disease) ([place]) 12/15/2016  ? Skin tag 12/15/2016  ? Adjustment disorder with mixed anxiety and depressed mood 04/02/2013  ? ABNORMAL ELECTROCARDIOGRAM 10/22/2010  ? OTHER HEART BLOCK 10/14/2010  ? GERD 05/22/2009  ? Insulin dependent diabetes mellitus with complications 00/37/0488  ? Mixed hyperlipidemia 07/17/2008  ? Essential hypertension 07/17/2008  ? History of adenomatous polyps of colon - probable attenuated polyposis 07/17/2008  ? ELEVATED PROSTATE SPECIFIC ANTIGEN 07/06/2007  ? ? ?Past Surgical History:  ?Procedure Laterality Date  ? APPENDECTOMY  1959  ? CATARACT EXTRACTION Right   ? COLONOSCOPY  multiple  ? adenomatous polyps, Dr Carlean Purl  ? ESOPHAGOGASTRODUODENOSCOPY  multiple  ? INGUINAL HERNIA REPAIR Left 1997  ? TONSILLECTOMY    ? ? ? ? ? ?Home Medications   ? ?Prior to Admission medications   ?Medication Sig Start Date End Date Taking? Authorizing Provider  ?benzonatate (TESSALON) 100 MG capsule Take 1 capsule (100 mg total) by mouth every 8 (eight) hours. 02/03/22  Yes Chassity Ludke, Junie Panning K, PA-C  ?doxycycline (VIBRAMYCIN) 100 MG capsule Take 1 capsule (100 mg total) by mouth 2 (two) times daily. 02/03/22  Yes Christella App K, PA-C  ?amLODipine (NORVASC) 5 MG tablet Take 1 tablet by mouth daily. 05/17/15  [provider]  ?aspirin EC 81 MG tablet Take 81 mg by mouth daily.    [provider]  ?Blood Glucose Monitoring Suppl (ONE TOUCH ULTRA 2) w/Device KIT Check blood glucose 3 times daily as directed    [provider]  ?finasteride (PROSCAR) 5 MG tablet Take 5 mg by mouth daily.    [provider]  ?insulin lispro protamine-lispro (HUMALOG 75/25 MIX) (75-25) 100 UNIT/ML SUSP injection Inject 40 Units into the skin 2 (two) times daily.    [provider]  ?JARDIANCE 10 MG TABS tablet Take 10 mg by mouth daily.  04/03/20   [provider]  ?losartan-hydrochlorothiazide (HYZAAR) 100-25 MG per tablet Take 1 tablet by mouth daily.    [provider]  ?metFORMIN (GLUCOPHAGE) 1000 MG tablet TAKE 1 TABLET TWICE A DAY WITH MEALS 05/08/12   Hendricks Limes, MD  ?Multiple Vitamins-Minerals (MULTIVITAMIN WITH MINERALS) tablet Take 1 tablet by mouth daily.    [provider]  ?omeprazole (PRILOSEC) 40 MG capsule Take 1 capsule (40 mg total) by mouth daily. 05/06/20   Noralyn Pick, NP  ?ONETOUCH ULTRA test strip  08/11/20   [provider]  ?simvastatin (ZOCOR) 40 MG tablet Take 1 tablet (40 mg total) by mouth at bedtime. 07/30/19   Lucille Passy, MD  ? ? ?Family History ?Family History  ?Problem Relation Age of Onset  ? Heart disease Father   ?     CHF  ? Stroke Father 69  ? Alzheimer's disease Mother   ? Pulmonary embolism Brother   ? Diabetes Brother   ? Heart disease Paternal Grandfather   ? Asthma Neg Hx   ? Colon cancer Neg Hx   ? Esophageal cancer Neg Hx   ? Pancreatic cancer Neg Hx   ? Prostate cancer Neg Hx   ? Rectal cancer Neg Hx   ? Stomach cancer Neg Hx   ? ? ?Social History ?Social History  ? ?Tobacco Use  ? Smoking status: Former  ?  Types: Cigarettes, E-cigarettes  ?  Quit date: 10/31/2012  ?  Years since quitting: 9.2  ? Smokeless tobacco: Never  ? Tobacco comments:  ?  smoked ages [age], up to 1 ppd . Smoked 2-3 cigars/ day [age]  ?Vaping Use  ? Vaping Use: Every day  ?Substance Use Topics  ? Alcohol use: Yes  ?  Comment: occasionally  ? Drug use: No  ? ? ? ?Allergies   ?Patient has no known allergies. ? ? ?Review of Systems ?Review of Systems  ?Constitutional:  Positive for activity change, fatigue and fever (Subjective fever). Negative for appetite change.  ?HENT:  Negative for congestion, sinus pressure, sneezing and sore throat.   ?Respiratory:  Positive for cough and shortness of breath.   ?Cardiovascular:  Negative for chest pain.  ?Gastrointestinal:  Negative for  abdominal pain, diarrhea, nausea and vomiting.  ?Neurological:  Negative for dizziness, light-headedness and headaches.  ? ? ?Physical Exam ?Triage Vital Signs ?ED Triage Vitals [02/03/22 1107]  ?Enc Vitals Group  ?   BP (!) 148/74  ?   Pulse Rate 68  ?   Resp 18  ?   Temp 98 ?F (36.7 ?C)  ?   Temp Source Oral  ?   SpO2 99 %  ?   Weight   ?   Height   ?   Head Circumference   ?   Peak Flow   ?   Pain Score 0  ?  Pain Loc   ?   Pain Edu?   ?   Excl. in [place]?   ? ?No data found. ? ?Updated Vital Signs ?BP (!) 148/74 (BP Location: Right Arm)   Pulse 68   Temp 98 ?F (36.7 ?C) (Oral)   Resp 18   SpO2 99%  ? ?Visual Acuity ?Right Eye Distance:   ?Left Eye Distance:   ?Bilateral Distance:   ? ?Right Eye Near:   ?Left Eye Near:    ?Bilateral Near:    ? ?Physical Exam ?Vitals reviewed.  ?Constitutional:   ?   General: He is awake.  ?   Appearance: Normal appearance. He is well-developed. He is not ill-appearing.  ?   Comments: Very pleasant male appears stated age in no acute distress sitting comfortably on exam room table  ?HENT:  ?   Head: Normocephalic and atraumatic.  ?   Right Ear: Tympanic membrane, ear canal and external ear normal. Tympanic membrane is not erythematous or bulging.  ?   Left Ear: Tympanic membrane, ear canal and external ear normal. Tympanic membrane is not erythematous or bulging.  ?   Nose: Nose normal.  ?   Mouth/Throat:  ?   Dentition: Has dentures.  ?   Pharynx: Uvula midline. Posterior oropharyngeal erythema present. No oropharyngeal exudate or uvula swelling.  ?Cardiovascular:  ?   Rate and Rhythm: Normal rate and regular rhythm.  ?   Heart sounds: Normal heart sounds, S1 normal and S2 normal. No murmur heard. ?Pulmonary:  ?   Effort: Pulmonary effort is normal. No accessory muscle usage or respiratory distress.  ?   Breath sounds: No stridor. Wheezing and rhonchi present. No rales.  ?   Comments: Widespread wheezing and rhonchi that do not clear with cough ?Abdominal:  ?   General: Bowel  sounds are normal.  ?   Palpations: Abdomen is soft.  ?   Tenderness: There is no abdominal tenderness.  ?Neurological:  ?   Mental Status: He is alert.  ?Psychiatric:     ?   Behavior: Behavior is cooperative.  ?

## 2022-02-03 NOTE — ED Triage Notes (Signed)
Pt presents with c/o cough and fever x 3 days.  ? ?Pt states he thinks he has bronchitis.  ?

## 2022-02-03 NOTE — Discharge Instructions (Addendum)
I am concerned that you have a COPD exacerbation or the beginning of pneumonia based on your x-ray.  Please start doxycycline 100 mg twice daily for 10 days.  Use Tessalon for cough.  Continue albuterol inhaler as needed.  Use Mucinex for cough.  I would recommend that you follow-up with either [place] or your primary care within a few days for reevaluation.  If you develop any worsening symptoms including worsening cough, high fever, chest pain, shortness of breath, weakness you need to go to the emergency room immediately. ?

## 2022-02-07 ENCOUNTER — Encounter: Payer: Self-pay | Admitting: Family Medicine

## 2022-02-07 ENCOUNTER — Ambulatory Visit (INDEPENDENT_AMBULATORY_CARE_PROVIDER_SITE_OTHER): Payer: Medicare HMO | Admitting: Family Medicine

## 2022-02-07 VITALS — BP 142/70 | HR 88 | Temp 97.9°F | Ht 67.0 in | Wt 242.4 lb

## 2022-02-07 DIAGNOSIS — J45909 Unspecified asthma, uncomplicated: Secondary | ICD-10-CM | POA: Insufficient documentation

## 2022-02-07 DIAGNOSIS — J22 Unspecified acute lower respiratory infection: Secondary | ICD-10-CM

## 2022-02-07 DIAGNOSIS — J4521 Mild intermittent asthma with (acute) exacerbation: Secondary | ICD-10-CM

## 2022-02-07 DIAGNOSIS — E1165 Type 2 diabetes mellitus with hyperglycemia: Secondary | ICD-10-CM | POA: Insufficient documentation

## 2022-02-07 DIAGNOSIS — Z794 Long term (current) use of insulin: Secondary | ICD-10-CM | POA: Insufficient documentation

## 2022-02-07 DIAGNOSIS — E669 Obesity, unspecified: Secondary | ICD-10-CM | POA: Insufficient documentation

## 2022-02-07 MED ORDER — PREDNISONE 10 MG PO TABS: 10.0000 mg | ORAL_TABLET | Freq: Two times a day (BID) | ORAL | 0 refills | Status: DC

## 2022-02-07 MED ORDER — PREDNISONE 10 MG PO TABS
10.0000 mg | ORAL_TABLET | Freq: Two times a day (BID) | ORAL | 0 refills | Status: AC
Start: 2022-02-07 — End: 2022-02-14

## 2022-02-07 NOTE — Progress Notes (Signed)
? ?Established Patient Office Visit ? ?Subjective:  ?Patient ID: Karl Darting., male    DOB: 10-14-1946  Age: 76 y.o. MRN: [id number] ? ?CC:  ?Chief Complaint  ?Patient presents with  ? Follow-up  ?  Follow up from urgent care seen for cough seems to be improving.   ? ? ?HPI ?Karl Darting. presents for follow-up of a emergency room visit 4 days ago where he had been diagnosed with a COPD flare with wheezing.  He was treated with albuterol inhaler and 10 days of doxycycline.  Seems to be proving but he is requiring use of his inhaler 3-4 times daily.  Denies fevers chills or phlegm production.  He has discontinued use of his vape.  Hernia surgery scheduled for the end of the month. ? ?Past Medical History:  ?Diagnosis Date  ? CAP (community acquired pneumonia) 1968  ? [place] ([facility])  ? Cataract   ? COPD (chronic obstructive pulmonary disease) ([place])   ? Diabetes mellitus   ? Diverticulosis of colon 11/19/12  ? left colon, Dr Carlean Purl  ? GERD (gastroesophageal reflux disease)   ? History of adenomatous polyps of colon - probable attenuated polyposis 07/17/2008  ? 2004: 2 polyps max 8 mm 1 lost and 1 infammatory 2010: 7 polyps TV and tubular adenomas max 6 mm 11/19/2012 :4 polyps; max 1 cm removed; all adenomas 01/05/2017 8 polyps max 10 mm adenomas recall 2021    ? Hyperlipidemia   ? Hypertension   ? Personal history of adenomatous colonic polyps 07/17/2008  ? 2004 2 polyps max 8 mm (adenomas) 2010 7 polyps TV and tubular adenomas max 6 mm 11/19/2012    ? ? ?Past Surgical History:  ?Procedure Laterality Date  ? APPENDECTOMY  1959  ? CATARACT EXTRACTION Right   ? COLONOSCOPY  multiple  ? adenomatous polyps, Dr Carlean Purl  ? ESOPHAGOGASTRODUODENOSCOPY  multiple  ? INGUINAL HERNIA REPAIR Left 1997  ? TONSILLECTOMY    ? ? ?Family History  ?Problem Relation Age of Onset  ? Heart disease Father   ?     CHF  ? Stroke Father 55  ? Alzheimer's disease Mother   ? Pulmonary embolism Brother   ? Diabetes Brother   ? Heart  disease Paternal Grandfather   ? Asthma Neg Hx   ? Colon cancer Neg Hx   ? Esophageal cancer Neg Hx   ? Pancreatic cancer Neg Hx   ? Prostate cancer Neg Hx   ? Rectal cancer Neg Hx   ? Stomach cancer Neg Hx   ? ? ?Social History  ? ?Socioeconomic History  ? Marital status: Married  ?  Spouse name: Not on file  ? Number of children: 2  ? Years of education: 52  ? Highest education level: Not on file  ?Occupational History  ? Occupation: Retired - [employer]  ?Tobacco Use  ? Smoking status: Former  ?  Types: Cigarettes, E-cigarettes  ?  Quit date: 10/31/2012  ?  Years since quitting: 9.2  ? Smokeless tobacco: Never  ? Tobacco comments:  ?  smoked ages [age], up to 1 ppd . Smoked 2-3 cigars/ day [age]  ?Vaping Use  ? Vaping Use: Every day  ?Substance and Sexual Activity  ? Alcohol use: Yes  ?  Comment: occasionally  ? Drug use: No  ? Sexual activity: Not on file  ?Other Topics Concern  ? Not on file  ?Social History Narrative  ? Fun: Building services engineer, working in the  yard and planting in the garden.   ? ?Social Determinants of Health  ? ?Financial Resource Strain: Not on file  ?Food Insecurity: Not on file  ?Transportation Needs: Not on file  ?Physical Activity: Not on file  ?Stress: Not on file  ?Social Connections: Not on file  ?Intimate Partner Violence: Not on file  ? ? ?Outpatient Medications Prior to Visit  ?Medication Sig Dispense Refill  ? amLODipine (NORVASC) 5 MG tablet Take 1 tablet by mouth daily.    ? aspirin EC 81 MG tablet Take 81 mg by mouth daily.    ? benzonatate (TESSALON) 100 MG capsule Take 1 capsule (100 mg total) by mouth every 8 (eight) hours. 21 capsule 0  ? Blood Glucose Monitoring Suppl (ONE TOUCH ULTRA 2) w/Device KIT Check blood glucose 3 times daily as directed    ? doxycycline (VIBRAMYCIN) 100 MG capsule Take 1 capsule (100 mg total) by mouth 2 (two) times daily. 20 capsule 0  ? finasteride (PROSCAR) 5 MG tablet Take 5 mg by mouth daily.    ? insulin lispro protamine-lispro (HUMALOG 75/25 MIX)  (75-25) 100 UNIT/ML SUSP injection Inject 40 Units into the skin 2 (two) times daily.    ? JARDIANCE 10 MG TABS tablet Take 10 mg by mouth daily.    ? losartan-hydrochlorothiazide (HYZAAR) 100-25 MG per tablet Take 1 tablet by mouth daily.    ? metFORMIN (GLUCOPHAGE) 1000 MG tablet TAKE 1 TABLET TWICE A DAY WITH MEALS 60 tablet 0  ? Multiple Vitamins-Minerals (MULTIVITAMIN WITH MINERALS) tablet Take 1 tablet by mouth daily.    ? omeprazole (PRILOSEC) 40 MG capsule Take 1 capsule (40 mg total) by mouth daily. 90 capsule 3  ? ONETOUCH ULTRA test strip     ? simvastatin (ZOCOR) 40 MG tablet Take 1 tablet (40 mg total) by mouth at bedtime. 90 tablet 3  ? ?No facility-administered medications prior to visit.  ? ? ?No Known Allergies ? ?ROS ?Review of Systems  ?Constitutional:  Negative for chills, diaphoresis, fatigue, fever and unexpected weight change.  ?HENT:  Negative for congestion and postnasal drip.   ?Eyes:  Negative for photophobia and visual disturbance.  ?Respiratory:  Positive for cough and wheezing. Negative for shortness of breath.   ?Cardiovascular:  Negative for chest pain.  ?Gastrointestinal: Negative.   ?Genitourinary: Negative.   ?Musculoskeletal:  Negative for arthralgias and myalgias.  ?Neurological:  Negative for speech difficulty and weakness.  ?Psychiatric/Behavioral: Negative.    ? ?  ?Objective:  ?  ?Physical Exam ?Vitals and nursing note reviewed.  ?Constitutional:   ?   General: He is not in acute distress. ?   Appearance: Normal appearance. He is not ill-appearing, toxic-appearing or diaphoretic.  ?HENT:  ?   Head: Normocephalic and atraumatic.  ?   Right Ear: External ear normal.  ?   Left Ear: External ear normal.  ?   Mouth/Throat:  ?   Mouth: Mucous membranes are moist.  ?   Pharynx: Oropharynx is clear. No oropharyngeal exudate or posterior oropharyngeal erythema.  ?Eyes:  ?   Extraocular Movements: Extraocular movements intact.  ?   Conjunctiva/sclera: Conjunctivae normal.  ?   Pupils:  Pupils are equal, round, and reactive to light.  ?Cardiovascular:  ?   Rate and Rhythm: Tachycardia present. Rhythm irregular.  ?Pulmonary:  ?   Effort: Pulmonary effort is normal. No respiratory distress.  ?   Breath sounds: Wheezing present. No rales.  ?Abdominal:  ?   General: Bowel sounds are  normal.  ?Musculoskeletal:  ?   Cervical back: No rigidity or tenderness.  ?Lymphadenopathy:  ?   Cervical: No cervical adenopathy.  ?Skin: ?   General: Skin is warm and dry.  ?Neurological:  ?   Mental Status: He is alert and oriented to person, place, and time.  ?Psychiatric:     ?   Mood and Affect: Mood normal.     ?   Behavior: Behavior normal.  ? ? ?BP (!) 142/70 (BP Location: Right Arm, Patient Position: Sitting, Cuff Size: Normal)   Pulse 88   Temp 97.9 ?F (36.6 ?C) (Temporal)   Ht 5' 7" (1.702 m)   Wt 242 lb 6.4 oz (110 kg)   SpO2 97%   BMI 37.97 kg/m?  ?Wt Readings from Last 3 Encounters:  ?02/07/22 242 lb 6.4 oz (110 kg)  ?12/13/21 244 lb 12.8 oz (111 kg)  ?01/25/21 236 lb (107 kg)  ? ? ? ?Health Maintenance Due  ?Topic Date Due  ? HEMOGLOBIN A1C  01/31/2019  ? ? ?There are no preventive care reminders to display for this patient. ? ?Lab Results  ?Component Value Date  ? TSH 1.21 07/30/2019  ? ?Lab Results  ?Component Value Date  ? WBC 7.7 12/14/2021  ? HGB 14.7 12/14/2021  ? HCT 45.7 12/14/2021  ? MCV 79.6 12/14/2021  ? PLT 228.0 12/14/2021  ? ?Lab Results  ?Component Value Date  ? NA 142 12/14/2021  ? K 4.3 12/14/2021  ? CO2 32 12/14/2021  ? GLUCOSE 105 (H) 12/14/2021  ? BUN 19 12/14/2021  ? CREATININE 1.10 12/14/2021  ? BILITOT 1.0 12/14/2021  ? ALKPHOS 92 12/14/2021  ? AST 15 12/14/2021  ? ALT 13 12/14/2021  ? PROT 7.0 12/14/2021  ? ALBUMIN 4.7 12/14/2021  ? CALCIUM 10.8 (H) 12/14/2021  ? GFR 65.35 12/14/2021  ? ?Lab Results  ?Component Value Date  ? CHOL 114 12/14/2021  ? ?Lab Results  ?Component Value Date  ? HDL 46.40 12/14/2021  ? ?Lab Results  ?Component Value Date  ? [place] 50 12/14/2021  ? ?Lab  Results  ?Component Value Date  ? TRIG 88.0 12/14/2021  ? ?Lab Results  ?Component Value Date  ? CHOLHDL 2 12/14/2021  ? ?Lab Results  ?Component Value Date  ? HGBA1C 9.0 (H) 05/07/2009  ? ? ?  ?Assessment & Plan

## 2022-02-07 NOTE — Addendum Note (Signed)
Addended by: Jon Billings on: 02/07/2022 11:22 AM ? ? Modules accepted: Orders ? ?

## 2022-02-21 DIAGNOSIS — E1165 Type 2 diabetes mellitus with hyperglycemia: Secondary | ICD-10-CM | POA: Diagnosis not present

## 2022-02-23 ENCOUNTER — Encounter: Payer: Self-pay | Admitting: Family Medicine

## 2022-02-23 ENCOUNTER — Ambulatory Visit (INDEPENDENT_AMBULATORY_CARE_PROVIDER_SITE_OTHER): Payer: Medicare HMO | Admitting: Family Medicine

## 2022-02-23 VITALS — BP 142/62 | HR 100 | Temp 97.2°F | Ht 67.0 in | Wt 238.8 lb

## 2022-02-23 DIAGNOSIS — Z01818 Encounter for other preprocedural examination: Secondary | ICD-10-CM

## 2022-02-23 DIAGNOSIS — I452 Bifascicular block: Secondary | ICD-10-CM

## 2022-02-23 DIAGNOSIS — Z794 Long term (current) use of insulin: Secondary | ICD-10-CM

## 2022-02-23 DIAGNOSIS — E113593 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, bilateral: Secondary | ICD-10-CM

## 2022-02-23 DIAGNOSIS — J189 Pneumonia, unspecified organism: Secondary | ICD-10-CM | POA: Diagnosis not present

## 2022-02-23 NOTE — Progress Notes (Signed)
? ?  Karl Lawson. is a 76 y.o. male who presents today for an office visit. ? ?Assessment/Plan:  ?New/Acute Problems: ?Surgical Clearance ?Just to be canceled ?Patient is much high risk with borderline hypoxia with saturations between 88 to 92%, likely worsened by history of recent COPD exacerbation and COPD and recent atypical pneumonia, although patient is asymptomatic ?Routine EKG obtained for surgical preoperative clearance given showed possible atrial fibrillation although not definitive on my interpretation, but will defer to cardiology ?Also showed RBBB with left anterior fascicular block ?Given these EKG abnormalities and hypoxia, I recommend deferring surgery until further evaluation by cardiology and possible PFTs if no improvement in O2 saturations at future visits ?Patient follow-up in 3 months ? ?Atypical pneumonia ?Symptoms improved since last visit about 2 weeks ago, the patient has some borderline hypoxia ? ? ?Chronic Problems Addressed Today: ?Insulin dependent diabetes mellitus with complications ?Patient follows with endocrinology ?Reports last hemoglobin A1c was in the 7s. ?Fasting CBG and 12/2021 was 105. ? ? ? ?  ?Subjective:  ?HPI: ? ?Patient has scheduled follow-up for laparoscopic hernia repair.  He is here for surgical clearance.  Past medical history pertinent for type 2 diabetes, hyperlipidemia, hypertension, COPD.  Had a recent episode of COPD exacerbation versus atypical pneumonia at the end of March/early April.  Says breathing is much improved after steroids and antibiotics. ?His wife is recently gone to memory care unit she is in respite care at the moment. ? ?   ?  ?Objective:  ?Physical Exam: ?BP (!) 142/62 (BP Location: Left Arm, Patient Position: Sitting, Cuff Size: Normal)   Pulse 100   Temp (!) 97.2 ?F (36.2 ?C) (Temporal)   Ht '5\' 7"'$  (1.702 m)   Wt 238 lb 12.8 oz (108.3 kg)   SpO2 92%   BMI 37.40 kg/m?   ?Gen: No acute distress, resting comfortably ?CV: Regular rate  and rhythm with no murmurs appreciated ?Pulm: Normal work of breathing, clear to auscultation bilaterally with no crackles, wheezes, or rhonchi ?Neuro: Grossly normal, moves all extremities ?Psych: Normal affect and thought content ? ?   ? ?Josephine Igo ?02/23/2022 3:58 PM  ?

## 2022-02-23 NOTE — Patient Instructions (Addendum)
Given your borderline lower oxygen and abnormal EKG along with medical conditions like diabetes, high cholesterol, and high blood pressure, I would recommend that we refer to cardiology and defer getting surgery at this time. ? ?We have put in the referral and will be contacting you regarding this. ?

## 2022-02-23 NOTE — Assessment & Plan Note (Signed)
Patient follows with endocrinology ?Reports last hemoglobin A1c was in the 7s. ?Fasting CBG and 12/2021 was 105. ? ? ?

## 2022-03-07 DIAGNOSIS — H2512 Age-related nuclear cataract, left eye: Secondary | ICD-10-CM | POA: Diagnosis not present

## 2022-03-07 DIAGNOSIS — H18452 Nodular corneal degeneration, left eye: Secondary | ICD-10-CM | POA: Diagnosis not present

## 2022-03-07 DIAGNOSIS — E113293 Type 2 diabetes mellitus with mild nonproliferative diabetic retinopathy without macular edema, bilateral: Secondary | ICD-10-CM | POA: Diagnosis not present

## 2022-03-07 DIAGNOSIS — H353132 Nonexudative age-related macular degeneration, bilateral, intermediate dry stage: Secondary | ICD-10-CM | POA: Diagnosis not present

## 2022-03-07 LAB — HM DIABETES EYE EXAM

## 2022-03-07 NOTE — Progress Notes (Deleted)
?Cardiology Office Note:   ? ?Date:  03/07/2022  ? ?ID:  Karl Lawson., DOB 26-Mar-1946, MRN [id number] ? ?PCP:  Libby Maw, MD ?  ?[facility] Providers ?Cardiologist:  None { ? ?Referring MD: Bonnita Hollow, MD  ? ? ?History of Present Illness:   ? ?Karl Lawson. is a 76 y.o. male with a hx of COPD, HTN, HLD, and DMII who was referred by Dr. Grandville Silos for further evaluation of SOB and abnormal ECG. ? ?Patient was seen by Dr. Grandville Silos in 02/2022 for preoperative evaluation. At that time, he was borderline hypoxic with SpO2 88-92%. ECG also demonstrated RBBB and LAFB and possible Afib although looks to be long first degree AVB. Given concern for SOB and ECG findings, he was recommended for CV evaluation. ? ?Today, *** ? ?Past Medical History:  ?Diagnosis Date  ? CAP (community acquired pneumonia) 1968  ? [place] ([facility])  ? Cataract   ? COPD (chronic obstructive pulmonary disease) ([place])   ? Diabetes mellitus   ? Diverticulosis of colon 11/19/12  ? left colon, Dr Carlean Purl  ? GERD (gastroesophageal reflux disease)   ? History of adenomatous polyps of colon - probable attenuated polyposis 07/17/2008  ? 2004: 2 polyps max 8 mm 1 lost and 1 infammatory 2010: 7 polyps TV and tubular adenomas max 6 mm 11/19/2012 :4 polyps; max 1 cm removed; all adenomas 01/05/2017 8 polyps max 10 mm adenomas recall 2021    ? Hyperlipidemia   ? Hypertension   ? Personal history of adenomatous colonic polyps 07/17/2008  ? 2004 2 polyps max 8 mm (adenomas) 2010 7 polyps TV and tubular adenomas max 6 mm 11/19/2012    ? ? ?Past Surgical History:  ?Procedure Laterality Date  ? APPENDECTOMY  1959  ? CATARACT EXTRACTION Right   ? COLONOSCOPY  multiple  ? adenomatous polyps, Dr Carlean Purl  ? ESOPHAGOGASTRODUODENOSCOPY  multiple  ? INGUINAL HERNIA REPAIR Left 1997  ? TONSILLECTOMY    ? ? ?Current Medications: ?No outpatient medications have been marked as taking for the 03/11/22 encounter (Appointment) with Freada Bergeron, MD.   ?  ? ?Allergies:   Patient has no known allergies.  ? ?Social History  ? ?Socioeconomic History  ? Marital status: Married  ?  Spouse name: Not on file  ? Number of children: 2  ? Years of education: 15  ? Highest education level: Not on file  ?Occupational History  ? Occupation: Retired - [employer]  ?Tobacco Use  ? Smoking status: Former  ?  Types: Cigarettes, E-cigarettes  ?  Quit date: 10/31/2012  ?  Years since quitting: 9.3  ? Smokeless tobacco: Never  ? Tobacco comments:  ?  smoked ages [age], up to 1 ppd . Smoked 2-3 cigars/ day [age]  ?Vaping Use  ? Vaping Use: Every day  ?Substance and Sexual Activity  ? Alcohol use: Yes  ?  Comment: occasionally  ? Drug use: No  ? Sexual activity: Not on file  ?Other Topics Concern  ? Not on file  ?Social History Narrative  ? Fun: Building services engineer, working in the yard and planting in the garden.   ? ?Social Determinants of Health  ? ?Financial Resource Strain: Not on file  ?Food Insecurity: Not on file  ?Transportation Needs: Not on file  ?Physical Activity: Not on file  ?Stress: Not on file  ?Social Connections: Not on file  ?  ? ?Family History: ?The patient's ***family history includes Alzheimer's disease  in his mother; Diabetes in his brother; Heart disease in his father and paternal grandfather; Pulmonary embolism in his brother; Stroke (age of onset: 54) in his father. There is no history of Asthma, Colon cancer, Esophageal cancer, Pancreatic cancer, Prostate cancer, Rectal cancer, or Stomach cancer. ? ?ROS:   ?Please see the history of present illness.    ?*** All other systems reviewed and are negative. ? ?EKGs/Labs/Other Studies Reviewed:   ? ?The following studies were reviewed today: ?*** ? ?EKG:  EKG is *** ordered today.  The ekg ordered today demonstrates *** ? ?Recent Labs: ?12/14/2021: ALT 13; BUN 19; Creatinine, Ser 1.10; Hemoglobin 14.7; Platelets 228.0; Potassium 4.3; Sodium 142  ?Recent Lipid Panel ?   ?Component Value Date/Time  ? CHOL 114 12/14/2021 0909  ?  TRIG 88.0 12/14/2021 0909  ? HDL 46.40 12/14/2021 0909  ? CHOLHDL 2 12/14/2021 0909  ? VLDL 17.6 12/14/2021 0909  ? [place] 50 12/14/2021 0909  ? LDLDIRECT 55.0 12/14/2021 0909  ? ? ? ?Risk Assessment/Calculations:   ?{Does this patient have ATRIAL FIBRILLATION?:[phone]} ? ?    ? ?Physical Exam:   ? ?VS:  There were no vitals taken for this visit.   ? ?Wt Readings from Last 3 Encounters:  ?02/23/22 238 lb 12.8 oz (108.3 kg)  ?02/07/22 242 lb 6.4 oz (110 kg)  ?12/13/21 244 lb 12.8 oz (111 kg)  ?  ? ?GEN: *** Well nourished, well developed in no acute distress ?HEENT: Normal ?NECK: No JVD; No carotid bruits ?LYMPHATICS: No lymphadenopathy ?CARDIAC: ***RRR, no murmurs, rubs, gallops ?RESPIRATORY:  Clear to auscultation without rales, wheezing or rhonchi  ?ABDOMEN: Soft, non-tender, non-distended ?MUSCULOSKELETAL:  No edema; No deformity  ?SKIN: Warm and dry ?NEUROLOGIC:  Alert and oriented x 3 ?PSYCHIATRIC:  Normal affect  ? ?ASSESSMENT:   ? ?No diagnosis found. ?PLAN:   ? ?In order of problems listed above: ? ?#RBBB: ?#LAFB: ?#First degree AVB: ?ECG by PCP with RBBB, LAFB and likely long first degree AVB.  ?-Check TTE ? ?#SOB: ?#COPD: ?Likely secondary to underlying COPD however, also with significant risk factors for CAD. Given plans for possible surgery, will need ischemic evaluation. ?-Check myocardial perfusion PET ?-Check TTE as above ? ?#HTN: ?-Continue amlodipine '5mg'$  daily ?-Continue losartan-HCTZ 100-'25mg'$  daily ? ?#HLD: ?-Continue simvastatin '40mg'$  daily ? ?#DMII: ?On insulin and jardiance ? ?   ? ?{Are you ordering a CV Procedure (e.g. stress test, cath, DCCV, TEE, etc)?   Press F2        :[id number]}  ? ? ?Medication Adjustments/Labs and Tests Ordered: ?Current medicines are reviewed at length with the patient today.  Concerns regarding medicines are outlined above.  ?No orders of the defined types were placed in this encounter. ? ?No orders of the defined types were placed in this encounter. ? ? ?There are  no Patient Instructions on file for this visit.  ? ?Signed, ?Freada Bergeron, MD  ?03/07/2022 1:23 PM    ?[place] ?

## 2022-03-11 ENCOUNTER — Encounter: Payer: Self-pay | Admitting: Cardiology

## 2022-03-11 ENCOUNTER — Ambulatory Visit (INDEPENDENT_AMBULATORY_CARE_PROVIDER_SITE_OTHER): Payer: Medicare HMO

## 2022-03-11 ENCOUNTER — Ambulatory Visit: Payer: Medicare HMO | Admitting: Cardiology

## 2022-03-11 VITALS — BP 162/80 | HR 65 | Ht 68.5 in | Wt 239.0 lb

## 2022-03-11 DIAGNOSIS — I4430 Unspecified atrioventricular block: Secondary | ICD-10-CM

## 2022-03-11 DIAGNOSIS — I10 Essential (primary) hypertension: Secondary | ICD-10-CM

## 2022-03-11 DIAGNOSIS — R0602 Shortness of breath: Secondary | ICD-10-CM

## 2022-03-11 DIAGNOSIS — E119 Type 2 diabetes mellitus without complications: Secondary | ICD-10-CM

## 2022-03-11 DIAGNOSIS — I4510 Unspecified right bundle-branch block: Secondary | ICD-10-CM | POA: Diagnosis not present

## 2022-03-11 DIAGNOSIS — I444 Left anterior fascicular block: Secondary | ICD-10-CM

## 2022-03-11 DIAGNOSIS — I441 Atrioventricular block, second degree: Secondary | ICD-10-CM | POA: Diagnosis not present

## 2022-03-11 DIAGNOSIS — Z01818 Encounter for other preprocedural examination: Secondary | ICD-10-CM | POA: Diagnosis not present

## 2022-03-11 DIAGNOSIS — J449 Chronic obstructive pulmonary disease, unspecified: Secondary | ICD-10-CM

## 2022-03-11 DIAGNOSIS — E782 Mixed hyperlipidemia: Secondary | ICD-10-CM | POA: Diagnosis not present

## 2022-03-11 MED ORDER — AMLODIPINE BESYLATE 10 MG PO TABS: 10.0000 mg | ORAL_TABLET | Freq: Every day | ORAL | 3 refills | Status: DC

## 2022-03-11 NOTE — Patient Instructions (Addendum)
Medication Instructions:  ? ?INCREASE YOUR AMLODIPINE TO 10 MG BY MOUTH DAILY ? ?*If you need a refill on your cardiac medications before your next appointment, please call your pharmacy* ? ? ?Testing/Procedures: ? ?Your physician has requested that you have an echocardiogram. Echocardiography is a painless test that uses sound waves to create images of your heart. It provides your doctor with information about the size and shape of your heart and how well your heart?s chambers and valves are working. This procedure takes approximately one hour. There are no restrictions for this procedure. ? ? ?How to Prepare for Your Cardiac PET/CT Stress Test: ? ?1. Please do not take these medications before your test:  ? ? ?Your remaining medications may be taken with water. ? ?2. Nothing to eat or drink, except water, 3 hours prior to arrival time.   ?NO caffeine/decaffeinated products, or chocolate 12 hours prior to arrival. ? ?3. NO perfume, cologne or lotion ? ?4. Total time is 1 to 2 hours; you may want to bring reading material for the waiting time. ? ?5. Please report to Admitting at the [place] [facility] 60 minutes early for your test. ? [address] ? [place], [place] [address] ? ?Diabetic Preparation:  ?Hold oral medications.  METFORMIN ?You may take NPH and Lantus insulin. ?Do not take Humalog or Humulin R (Regular Insulin) the day of your test. ?Check blood sugars prior to leaving the house. ?If able to eat breakfast prior to 3 hour fasting, you may take all medications, including your insulin, ?Do not worry if you miss your breakfast dose of insulin - start at your next meal. ? ?IF YOU THINK YOU MAY BE PREGNANT, OR ARE NURSING PLEASE INFORM THE TECHNOLOGIST. ? ?In preparation for your appointment, medication and supplies will be purchased.  Appointment availability is limited, so if you need to cancel or reschedule, please call the Radiology Department at [phone]  24 hours in advance to  avoid a cancellation fee of $100.00 ? ?What to Expect After you Arrive: ? ?Once you arrive and check in for your appointment, you will be taken to a preparation room within the Radiology Department.  A technologist or Nurse will obtain your medical history, verify that you are correctly prepped for the exam, and explain the procedure.  Afterwards,  an IV will be started in your arm and electrodes will be placed on your skin for EKG monitoring during the stress portion of the exam. Then you will be escorted to the PET/CT scanner.  There, staff will get you positioned on the scanner and obtain a blood pressure and EKG.  During the exam, you will continue to be connected to the EKG and blood pressure machines.  A small, safe amount of a radioactive tracer will be injected in your IV to obtain a series of pictures of your heart along with an injection of a stress agent.   ? ?After your Exam: ? ?It is recommended that you eat a meal and drink a caffeinated beverage to counter act any effects of the stress agent.  Drink plenty of fluids for the remainder of the day and urinate frequently for the first couple of hours after the exam.  Your doctor will inform you of your test results within 7-10 business days. ? ?For questions about your test or how to prepare for your test, please call: ?Marchia Bond, Cardiac Imaging Nurse Navigator  ?Gordy Clement, Cardiac Imaging Nurse Navigator ?Office: [phone] ? ? ? ?ZIO  XT- Long Term Monitor Instructions ? ?Your physician has requested you wear a ZIO patch monitor for 3 days.  ?This is a single patch monitor. [employer] supplies one patch monitor per enrollment. Additional ?stickers are not available. Please do not apply patch if you will be having a Nuclear Stress Test,  ?Echocardiogram, Cardiac CT, MRI, or Chest Xray during the period you would be wearing the  ?monitor. The patch cannot be worn during these tests. You cannot remove and re-apply the  ?ZIO XT patch monitor.  ?Your  ZIO patch monitor will be mailed 3 day USPS to your address on file. It may take 3-5 days  ?to receive your monitor after you have been enrolled.  ?Once you have received your monitor, please review the enclosed instructions. Your monitor  ?has already been registered assigning a specific monitor serial # to you. ? ?Billing and Patient Assistance Program Information ? ?We have supplied [employer] with any of your insurance information on file for billing purposes. ?[employer] offers a sliding scale Patient Assistance Program for patients that do not have  ?insurance, or whose insurance does not completely cover the cost of the ZIO monitor.  ?You must apply for the Patient Assistance Program to qualify for this discounted rate.  ?To apply, please call [employer] at [phone], select option 4, select option 2, ask to apply for  ?Patient Assistance Program. Theodore Demark will ask your household income, and how many people  ?are in your household. They will quote your out-of-pocket cost based on that information.  ?[employer] will also be able to set up a 36-month interest-free payment plan if needed. ? ?Applying the monitor ?  ?Shave hair from upper left chest.  ?Hold abrader disc by orange tab. Rub abrader in 40 strokes over the upper left chest as  ?indicated in your monitor instructions.  ?Clean area with 4 enclosed alcohol pads. Let dry.  ?Apply patch as indicated in monitor instructions. Patch will be placed under collarbone on left  ?side of chest with arrow pointing upward.  ?Rub patch adhesive wings for 2 minutes. Remove white label marked "1". Remove the white  ?label marked "2". Rub patch adhesive wings for 2 additional minutes.  ?While looking in a mirror, press and release button in center of patch. A small green light will  ?flash 3-4 times. This will be your only indicator that the monitor has been turned on.  ?Do not shower for the first 24 hours. You may shower after the first 24 hours.  ?Press the button if you feel  a symptom. You will hear a small click. Record Date, Time and  ?Symptom in the Patient Logbook.  ?When you are ready to remove the patch, follow instructions on the last 2 pages of Patient  ?Logbook. Stick patch monitor onto the last page of Patient Logbook.  ?Place Patient Logbook in the blue and white box. Use locking tab on box and tape box closed  ?securely. The blue and white box has prepaid postage on it. Please place it in the mailbox as  ?soon as possible. Your physician should have your test results approximately 7 days after the  ?monitor [facility]  ?Call [facility] [phone] you have questions regarding  ?your ZIO XT patch monitor. Call them immediately if you see an orange light blinking on your  ?monitor.  ?If your monitor falls off in less than 4 days, contact our Monitor department at [phone]  ?If your  monitor becomes loose or falls off after 4 days call [employer] at [phone] for  ?suggestions on securing your monitor ? ? ? ?Follow-Up: ? ?3 MONTHS WITH AN EXTENDER IN THE OFFICE ? ? ? ?Important Information About Sugar ? ? ? ? ? ? ?

## 2022-03-11 NOTE — Progress Notes (Unsigned)
Enrolled patient for a 3 day Zio XT monitor to be mailed to patients home  

## 2022-03-11 NOTE — Progress Notes (Signed)
?Cardiology Office Note:   ? ?Date:  03/11/2022  ? ?ID:  Karl Lawson., DOB 1946/10/25, MRN [id number] ? ?PCP:  Libby Maw, MD ?  ?[facility] Providers ?Cardiologist:  None { ? ?Referring MD: Bonnita Hollow, MD  ? ? ?History of Present Illness:   ? ?Karl Lawson. is a 76 y.o. male with a hx of COPD, HTN, HLD, and DMII who was referred by Dr. Grandville Silos for further evaluation of SOB and abnormal ECG. ? ?Patient was seen by Dr. Grandville Silos in 02/2022 for preoperative evaluation. At that time, he was borderline hypoxic with SpO2 88-92%. ECG also demonstrated RBBB and LAFB and likely Mobitz I. Given concern for SOB and ECG findings, he was recommended for CV evaluation. ? ?Today, the patient states that he will have a double hernia repair. He reports his breathing is fine, about the same since his visit with Dr. Grandville Silos. His oxygen did not drop when he was walking around at that visit. He does not use oxygen at home. ? ?Generally he is not very active. If he took his time, he believes he would be able to walk one mile. He denies being limited by shortness of breath unless he walks up a hill or incline. No chest pain, lightheadedness, dizziness or syncope. No known CAD.  ? ?At home his blood pressure is averaging in the 140s/70s. He remains complaint with his current antihypertensives losartan-HCTZ and amlodipine. His BP is higher in clinic today at 162/80, which he attributes to significant stress. Last week his wife suffered a fall and fractured her arm. She is in rehab, but suffering from hallucinations due to side effects of her medication. ? ?He denies any palpitations, chest pain, or peripheral edema. No lightheadedness, headaches, syncope, orthopnea, or PND. ? ? ?Past Medical History:  ?Diagnosis Date  ? CAP (community acquired pneumonia) 1968  ? [place] ([facility])  ? Cataract   ? COPD (chronic obstructive pulmonary disease) ([facility])   ? Diabetes mellitus   ? Diverticulosis of colon 11/19/12   ? left colon, Dr Carlean Purl  ? GERD (gastroesophageal reflux disease)   ? History of adenomatous polyps of colon - probable attenuated polyposis 07/17/2008  ? 2004: 2 polyps max 8 mm 1 lost and 1 infammatory 2010: 7 polyps TV and tubular adenomas max 6 mm 11/19/2012 :4 polyps; max 1 cm removed; all adenomas 01/05/2017 8 polyps max 10 mm adenomas recall 2021    ? Hyperlipidemia   ? Hypertension   ? Personal history of adenomatous colonic polyps 07/17/2008  ? 2004 2 polyps max 8 mm (adenomas) 2010 7 polyps TV and tubular adenomas max 6 mm 11/19/2012    ? ? ?Past Surgical History:  ?Procedure Laterality Date  ? APPENDECTOMY  1959  ? CATARACT EXTRACTION Right   ? COLONOSCOPY  multiple  ? adenomatous polyps, Dr Carlean Purl  ? ESOPHAGOGASTRODUODENOSCOPY  multiple  ? INGUINAL HERNIA REPAIR Left 1997  ? TONSILLECTOMY    ? ? ?Current Medications: ?Current Meds  ?Medication Sig  ? amLODipine (NORVASC) 10 MG tablet Take 1 tablet (10 mg total) by mouth daily.  ? aspirin EC 81 MG tablet Take 81 mg by mouth daily.  ? Blood Glucose Monitoring Suppl (ONE TOUCH ULTRA 2) w/Device KIT Check blood glucose 3 times daily as directed  ? finasteride (PROSCAR) 5 MG tablet Take 5 mg by mouth daily.  ? insulin lispro protamine-lispro (HUMALOG 75/25 MIX) (75-25) 100 UNIT/ML SUSP injection Inject 40 Units into the  skin 2 (two) times daily.  ? JARDIANCE 10 MG TABS tablet Take 10 mg by mouth daily.  ? losartan-hydrochlorothiazide (HYZAAR) 100-25 MG per tablet Take 1 tablet by mouth daily.  ? metFORMIN (GLUCOPHAGE) 1000 MG tablet TAKE 1 TABLET TWICE A DAY WITH MEALS  ? Multiple Vitamins-Minerals (PRESERVISION AREDS 2 PO) Take 2 capsules by mouth daily.  ? omeprazole (PRILOSEC) 40 MG capsule Take 1 capsule (40 mg total) by mouth daily.  ? ONETOUCH ULTRA test strip   ? simvastatin (ZOCOR) 40 MG tablet Take 1 tablet (40 mg total) by mouth at bedtime.  ? [DISCONTINUED] amLODipine (NORVASC) 5 MG tablet Take 1 tablet by mouth daily.  ?  ? ?Allergies:   Patient has  no known allergies.  ? ?Social History  ? ?Socioeconomic History  ? Marital status: Married  ?  Spouse name: Not on file  ? Number of children: 2  ? Years of education: 43  ? Highest education level: Not on file  ?Occupational History  ? Occupation: Retired - [employer]  ?Tobacco Use  ? Smoking status: Former  ?  Types: Cigarettes, E-cigarettes  ?  Quit date: 10/31/2012  ?  Years since quitting: 9.3  ? Smokeless tobacco: Never  ? Tobacco comments:  ?  smoked ages [age], up to 1 ppd . Smoked 2-3 cigars/ day [age]  ?Vaping Use  ? Vaping Use: Every day  ?Substance and Sexual Activity  ? Alcohol use: Yes  ?  Comment: occasionally  ? Drug use: No  ? Sexual activity: Not on file  ?Other Topics Concern  ? Not on file  ?Social History Narrative  ? Fun: Building services engineer, working in the yard and planting in the garden.   ? ?Social Determinants of Health  ? ?Financial Resource Strain: Not on file  ?Food Insecurity: Not on file  ?Transportation Needs: Not on file  ?Physical Activity: Not on file  ?Stress: Not on file  ?Social Connections: Not on file  ?  ? ?Family History: ?The patient's family history includes Alzheimer's disease in his mother; Diabetes in his brother; Heart disease in his father and paternal grandfather; Pulmonary embolism in his brother; Stroke (age of onset: 66) in his father. There is no history of Asthma, Colon cancer, Esophageal cancer, Pancreatic cancer, Prostate cancer, Rectal cancer, or Stomach cancer. ? ?ROS:   ?Review of Systems  ?Constitutional:  Negative for chills and fever.  ?HENT:  Negative for ear pain and sore throat.   ?Eyes:  Negative for photophobia and discharge.  ?Respiratory:  Positive for shortness of breath. Negative for cough and sputum production.   ?Cardiovascular:  Negative for chest pain, palpitations, orthopnea, claudication, leg swelling and PND.  ?Gastrointestinal:  Negative for blood in stool, diarrhea and vomiting.  ?Genitourinary:  Negative for dysuria and hematuria.   ?Musculoskeletal:  Negative for falls.  ?Neurological:  Negative for speech change and seizures.  ?Endo/Heme/Allergies:  Negative for polydipsia.  ?Psychiatric/Behavioral:  Negative for depression and suicidal ideas.   ? ? ?EKGs/Labs/Other Studies Reviewed:   ? ?The following studies were reviewed today: ? ?CT Chest 04/05/2013: ?IMPRESSION:  ? ?1.  No new pulmonary nodules.  ? ?2.  Interval resolution of previously noted dominant nodule within  ?the inferior segment of the lingula.  Residual nodules adjacent to  ?the left major fissure are unchanged with dominant nodule measuring  ?5 mm in diameter.  This examination documents 4 months of  ?stability.  Given risk factors for bronchogenic carcinoma, follow-  ?up chest CT  at 6 - 12 months is recommended.  This recommendation  ?follows the consensus statement: Guidelines for Management of Small  ?Pulmonary Nodules Detected on CT Scans: A Statement from the  ?Fleischner Society as published in Radiology 2005; [id number].  ? ?3.  Mild centrilobular emphysematous change.  ? ?4.  Cholelithiasis.  ? ? ?EKG:  EKG is personally reviewed. ?03/11/2022: Mobitz I AV block. Rate 65 bpm. RBBB. LAFB. ?02/23/2022 (Dr. Grandville Silos): "Routine EKG obtained for surgical preoperative clearance given showed possible atrial fibrillation although not definitive on my interpretation, but will defer to cardiology ?Also showed RBBB with left anterior fascicular block" ? ? ? ?Recent Labs: ?12/14/2021: ALT 13; BUN 19; Creatinine, Ser 1.10; Hemoglobin 14.7; Platelets 228.0; Potassium 4.3; Sodium 142  ? ?Recent Lipid Panel ?   ?Component Value Date/Time  ? CHOL 114 12/14/2021 0909  ? TRIG 88.0 12/14/2021 0909  ? HDL 46.40 12/14/2021 0909  ? CHOLHDL 2 12/14/2021 0909  ? VLDL 17.6 12/14/2021 0909  ? Beatty 50 12/14/2021 0909  ? LDLDIRECT 55.0 12/14/2021 0909  ? ? ? ?Risk Assessment/Calculations:   ?  ? ?    ? ?Physical Exam:   ? ?VS:  BP (!) 162/80   Pulse 65   Ht 5' 8.5" (1.74 m)   Wt 239 lb (108.4 kg)    SpO2 96%   BMI 35.81 kg/m?    ? ?Wt Readings from Last 3 Encounters:  ?03/11/22 239 lb (108.4 kg)  ?02/23/22 238 lb 12.8 oz (108.3 kg)  ?02/07/22 242 lb 6.4 oz (110 kg)  ?  ? ?GEN: Well nourished, well developed in

## 2022-03-15 ENCOUNTER — Telehealth: Payer: Self-pay | Admitting: *Deleted

## 2022-03-15 DIAGNOSIS — I4430 Unspecified atrioventricular block: Secondary | ICD-10-CM | POA: Diagnosis not present

## 2022-03-15 NOTE — Telephone Encounter (Signed)
-----   Message from Nuala Alpha, LPN sent at 07/13/8956  1:57 PM EDT ----- ?Regarding: CARDIAC PET SCAN PER DR. Johney Frame ? ? ?-NM PET CT Cardiac perfusion multi ([id number]) ordered by Dr. Johney Frame  ?-please pre-cert and schedule  ?Thanks,  ?Karl Lawson  ? ? ?

## 2022-03-15 NOTE — Telephone Encounter (Signed)
Pts Cardiac PET Scan is scheduled for 04/26/22 at 0930. ?Pt made aware of appt date and time by [facility] Scheduling dept.  ?

## 2022-03-16 ENCOUNTER — Ambulatory Visit (INDEPENDENT_AMBULATORY_CARE_PROVIDER_SITE_OTHER): Payer: Medicare HMO

## 2022-03-16 DIAGNOSIS — Z0000 Encounter for general adult medical examination without abnormal findings: Secondary | ICD-10-CM | POA: Diagnosis not present

## 2022-03-16 NOTE — Progress Notes (Signed)
Subjective:   Karl Venuto. is a 76 y.o. male who presents for an Subsequent Medicare Annual Wellness Visit.  I connected with Karl Lawson today by telephone and verified that I am speaking with the correct person using two identifiers. Location patient: home Location provider: work Persons participating in the virtual visit: patient, provider.   I discussed the limitations, risks, security and privacy concerns of performing an evaluation and management service by telephone and the availability of in person appointments. I also discussed with the patient that there may be a patient responsible charge related to this service. The patient expressed understanding and verbally consented to this telephonic visit.    Interactive audio and video telecommunications were attempted between this provider and patient, however failed, due to patient having technical difficulties OR patient did not have access to video capability.  We continued and completed visit with audio only.    Review of Systems     Cardiac Risk Factors include: advanced age (>35mn, >>52women);diabetes mellitus;male gender;hypertension     Objective:    Today's Vitals   There is no height or weight on file to calculate BMI.     03/16/2022    9:53 AM 02/17/2017   12:04 PM 12/22/2016    9:53 AM  Advanced Directives  Does Patient Have a Medical Advance Directive? Yes No No  Type of AParamedicof ALa PargueraLiving will    Copy of HBowmanin Chart? No - copy requested      Current Medications (verified) Outpatient Encounter Medications as of 03/16/2022  Medication Sig   amLODipine (NORVASC) 10 MG tablet Take 1 tablet (10 mg total) by mouth daily.   aspirin EC 81 MG tablet Take 81 mg by mouth daily.   Blood Glucose Monitoring Suppl (ONE TOUCH ULTRA 2) w/Device KIT Check blood glucose 3 times daily as directed   finasteride (PROSCAR) 5 MG tablet Take 5 mg by mouth daily.    insulin lispro protamine-lispro (HUMALOG 75/25 MIX) (75-25) 100 UNIT/ML SUSP injection Inject 40 Units into the skin 2 (two) times daily.   JARDIANCE 10 MG TABS tablet Take 10 mg by mouth daily.   losartan-hydrochlorothiazide (HYZAAR) 100-25 MG per tablet Take 1 tablet by mouth daily.   metFORMIN (GLUCOPHAGE) 1000 MG tablet TAKE 1 TABLET TWICE A DAY WITH MEALS   Multiple Vitamins-Minerals (PRESERVISION AREDS 2 PO) Take 2 capsules by mouth daily.   omeprazole (PRILOSEC) 40 MG capsule Take 1 capsule (40 mg total) by mouth daily.   ONETOUCH ULTRA test strip    simvastatin (ZOCOR) 40 MG tablet Take 1 tablet (40 mg total) by mouth at bedtime.   No facility-administered encounter medications on file as of 03/16/2022.    Allergies (verified) Patient has no known allergies.   History: Past Medical History:  Diagnosis Date   CAP (community acquired pneumonia) 1968   [place]   Cataract    COPD (chronic obstructive pulmonary disease) (HOso    Diabetes mellitus    Diverticulosis of colon 11/19/12   left colon, Dr GCarlean Purl  GERD (gastroesophageal reflux disease)    History of adenomatous polyps of colon - probable attenuated polyposis 07/17/2008   2004: 2 polyps max 8 mm 1 lost and 1 infammatory 2010: 7 polyps TV and tubular adenomas max 6 mm 11/19/2012 :4 polyps; max 1 cm removed; all adenomas 01/05/2017 8 polyps max 10 mm adenomas recall 2021     Hyperlipidemia  Hypertension    Personal history of adenomatous colonic polyps 07/17/2008   2004 2 polyps max 8 mm (adenomas) 2010 7 polyps TV and tubular adenomas max 6 mm 11/19/2012     Past Surgical History:  Procedure Laterality Date   APPENDECTOMY  1959   CATARACT EXTRACTION Right    COLONOSCOPY  multiple   adenomatous polyps, Dr Carlean Purl   ESOPHAGOGASTRODUODENOSCOPY  multiple   INGUINAL HERNIA REPAIR Left 1997   TONSILLECTOMY     Family History  Problem Relation Age of Onset   Heart disease Father        CHF   Stroke Father  80   Alzheimer's disease Mother    Pulmonary embolism Brother    Diabetes Brother    Heart disease Paternal Grandfather    Asthma Neg Hx    Colon cancer Neg Hx    Esophageal cancer Neg Hx    Pancreatic cancer Neg Hx    Prostate cancer Neg Hx    Rectal cancer Neg Hx    Stomach cancer Neg Hx    Social History   Socioeconomic History   Marital status: Married    Spouse name: Not on file   Number of children: 2   Years of education: 14   Highest education level: Not on file  Occupational History   Occupation: Retired - Mostly  Tobacco Use   Smoking status: Former    Types: Cigarettes, E-cigarettes    Quit date: 10/31/2012    Years since quitting: 9.3   Smokeless tobacco: Never   Tobacco comments:    smoked ages [age], up to 1 ppd . Smoked 2-3 cigars/ day [age]  Vaping Use   Vaping Use: Every day  Substance and Sexual Activity   Alcohol use: Yes    Comment: occasionally   Drug use: No   Sexual activity: Not on file  Other Topics Concern   Not on file  Social History Narrative   Fun: Watching tv, working in the yard and planting in the garden.    Social Determinants of Health   Financial Resource Strain: Low Risk    Difficulty of Paying Living Expenses: Not hard at all  Food Insecurity: No Food Insecurity   Worried About Charity fundraiser in the Last Year: Never true   [place] in the Last Year: Never true  Transportation Needs: No Transportation Needs   Lack of Transportation (Medical): No   Lack of Transportation (Non-Medical): No  Physical Activity: Insufficiently Active   Days of Exercise per Week: 3 days   Minutes of Exercise per Session: 30 min  Stress: No Stress Concern Present   Feeling of Stress : Not at all  Social Connections: Moderately Integrated   Frequency of Communication with Friends and Family: Three times a week   Frequency of Social Gatherings with Friends and Family: Three times a week   Attends Religious Services: More than 4 times  per year   Active Member of Clubs or Organizations: No   Attends Archivist Meetings: Never   Marital Status: Married    Tobacco Counseling Counseling given: Not Answered Tobacco comments: smoked ages [age], up to 1 ppd . Smoked 2-3 cigars/ day [age]   Clinical Intake:  Pre-visit preparation completed: Yes  Pain : No/denies pain     Nutritional Risks: None Diabetes: Yes CBG done?: No Did pt. bring in CBG monitor from home?: No  How often do you need to have someone help you when  you read instructions, pamphlets, or other written materials from your doctor or pharmacy?: 1 - Never What is the last grade level you completed in school?: trade school  Diabetic?yes Nutrition Risk Assessment:  Has the patient had any N/V/D within the last 2 months?  No  Does the patient have any non-healing wounds?  No  Has the patient had any unintentional weight loss or weight gain?  No   Diabetes:  Is the patient diabetic?  Yes  If diabetic, was a CBG obtained today?  No  Did the patient bring in their glucometer from home?  No  How often do you monitor your CBG's? Libre .   Financial Strains and Diabetes Management:  Are you having any financial strains with the device, your supplies or your medication? No .  Does the patient want to be seen by Chronic Care Management for management of their diabetes?  No  Would the patient like to be referred to a Nutritionist or for Diabetic Management?  No   Diabetic Exams:  Diabetic Eye Exam: Completed 02/2022 Diabetic Foot Exam: Overdue, Pt has been advised about the importance in completing this exam. Pt is scheduled for diabetic foot exam on next office visit .   Interpreter Needed?: No  Information entered by :: [email or web address]   Activities of Daily Living    03/16/2022    9:56 AM  In your present state of health, do you have any difficulty performing the following activities:  Hearing? 0  Vision? 0  Difficulty concentrating  or making decisions? 0  Walking or climbing stairs? 0  Dressing or bathing? 0  Doing errands, shopping? 0  Preparing Food and eating ? N  Using the Toilet? N  In the past six months, have you accidently leaked urine? N  Do you have problems with loss of bowel control? N  Managing your Medications? N  Managing your Finances? N  Housekeeping or managing your Housekeeping? N    Patient Care Team: Libby Maw, MD as PCP - General (Family Medicine)  Indicate any recent Medical Services you may have received from other than [facility] providers in the past year (date may be approximate).     Assessment:   This is a routine wellness examination for [facility].  Hearing/Vision screen Vision Screening - Comments:: Annual eye exams wear glasses   Dietary issues and exercise activities discussed: Current Exercise Habits: Home exercise routine, Type of exercise: walking, Time (Minutes): 30, Frequency (Times/Week): 3, Weekly Exercise (Minutes/Week): 90, Intensity: Mild, Exercise limited by: None identified   Goals Addressed   None    Depression Screen    03/16/2022    9:56 AM 03/16/2022    9:52 AM 02/23/2022    3:09 PM 02/07/2022   10:57 AM 12/13/2021    9:23 AM 01/25/2021   10:52 AM 07/30/2019   10:19 AM  PHQ 2/9 Scores  PHQ - 2 Score 0 0 0 0 0 0 0    Fall Risk    03/16/2022    9:53 AM 02/23/2022    3:09 PM 02/07/2022   10:57 AM 12/13/2021    9:23 AM 01/25/2021   10:52 AM  Fall Risk   Falls in the past year? 0 0 0 0 0  Number falls in past yr: 0 0 0 0   Injury with Fall? 0 0     Follow up Falls evaluation completed        FALL RISK PREVENTION PERTAINING TO THE HOME:  Any stairs in  or around the home? Yes  If so, are there any without handrails? No  Home free of loose throw rugs in walkways, pet beds, electrical cords, etc? Yes  Adequate lighting in your home to reduce risk of falls? Yes   ASSISTIVE DEVICES UTILIZED TO PREVENT FALLS:  Life alert? No  Use of a cane, walker or  w/c? No  Grab bars in the bathroom? Yes  Shower chair or bench in shower? Yes  Elevated toilet seat or a handicapped toilet? Yes   Cognitive Function:    Normal cognitive status assessed by telephone conversation  by this Nurse Health Advisor. No abnormalities found.      Immunizations Immunization History  Administered Date(s) Administered   Fluad Quad(high Dose 65+) 07/01/2019   Influenza, Quadrivalent, Recombinant, Inj, Pf 08/09/2017, 08/09/2018, 07/15/2021   Influenza-Unspecified 09/26/2014, 10/07/2014, 09/30/2015, 08/07/2018, 07/09/2019, 07/31/2020, 08/16/2021   PFIZER(Purple Top)SARS-COV-2 Vaccination 12/22/2019, 01/14/2020, 09/18/2020, 11/19/2021   Pneumococcal Conjugate-13 04/16/2015   Td 07/17/2008    TDAP status: Due, Education has been provided regarding the importance of this vaccine. Advised may receive this vaccine at local pharmacy or Health Dept. Aware to provide a copy of the vaccination record if obtained from local pharmacy or Health Dept. Verbalized acceptance and understanding.  Flu Vaccine status: Up to date  Pneumococcal vaccine status: Up to date  Covid-19 vaccine status: Completed vaccines  Qualifies for Shingles Vaccine? Yes   Zostavax completed No   Shingrix Completed?: No.    Education has been provided regarding the importance of this vaccine. Patient has been advised to call insurance company to determine out of pocket expense if they have not yet received this vaccine. Advised may also receive vaccine at local pharmacy or Health Dept. Verbalized acceptance and understanding.  Screening Tests Health Maintenance  Topic Date Due   HEMOGLOBIN A1C  01/31/2019   Pneumonia Vaccine 79+ Years old (2 - PPSV23 if available, else PCV20) 05/09/2022 (Originally 04/15/2016)   COLONOSCOPY (Pts 45-83yr Insurance coverage will need to be confirmed)  02/08/2023 (Originally 01/06/2020)   INFLUENZA VACCINE  06/07/2022   FOOT EXAM  09/22/2022   OPHTHALMOLOGY EXAM   12/08/2022   Hepatitis C Screening  Completed   HPV VACCINES  Aged Out   TETANUS/TDAP  Discontinued   COVID-19 Vaccine  Discontinued   Zoster Vaccines- Shingrix  Discontinued    Health Maintenance  Health Maintenance Due  Topic Date Due   HEMOGLOBIN A1C  01/31/2019    Colorectal cancer screening: No longer required.   Lung Cancer Screening: (Low Dose CT Chest recommended if Age 10869-80years, 30 pack-year currently smoking OR have quit w/in 15years.) does not qualify.   Lung Cancer Screening Referral: n/a  Additional Screening:  Hepatitis C Screening: does not qualify; Completed 07/30/2019  Vision Screening: Recommended annual ophthalmology exams for early detection of glaucoma and other disorders of the eye. Is the patient up to date with their annual eye exam?  Yes  Who is the provider or what is the name of the office in which the patient attends annual eye exams? Dr.Hecker  If pt is not established with a provider, would they like to be referred to a provider to establish care? No .   Dental Screening: Recommended annual dental exams for proper oral hygiene  Community Resource Referral / Chronic Care Management: CRR required this visit?  No   CCM required this visit?  No      Plan:     I have personally reviewed and noted the  following in the patient's chart:   Medical and social history Use of alcohol, tobacco or illicit drugs  Current medications and supplements including opioid prescriptions. Patient is not currently taking opioid prescriptions. Functional ability and status Nutritional status Physical activity Advanced directives List of other physicians Hospitalizations, surgeries, and ER visits in previous 12 months Vitals Screenings to include cognitive, depression, and falls Referrals and appointments  In addition, I have reviewed and discussed with patient certain preventive protocols, quality metrics, and best practice recommendations. A written  personalized care plan for preventive services as well as general preventive health recommendations were provided to patient.     Randel Pigg, LPN   [id number]   Nurse Notes: none

## 2022-03-16 NOTE — Patient Instructions (Signed)
Mr. Delduca , ?Thank you for taking time to come for your Medicare Wellness Visit. I appreciate your ongoing commitment to your health goals. Please review the following plan we discussed and let me know if I can assist you in the future.  ? ?Screening recommendations/referrals: ?Colonoscopy: no longer required  ?Recommended yearly ophthalmology/optometry visit for glaucoma screening and checkup ?Recommended yearly dental visit for hygiene and checkup ? ?Vaccinations: ?Influenza vaccine: completed  ?Pneumococcal vaccine: completed  ?Tdap vaccine: due  ?Shingles vaccine: will consider    ? ?Advanced directives: yes  ? ?Conditions/risks identified: none  ? ?Next appointment: none  ? ?Preventive Care 77 Years and Older, Male ?Preventive care refers to lifestyle choices and visits with your health care provider that can promote health and wellness. ?What does preventive care include? ?A yearly physical exam. This is also called an annual well check. ?Dental exams once or twice a year. ?Routine eye exams. Ask your health care provider how often you should have your eyes checked. ?Personal lifestyle choices, including: ?Daily care of your teeth and gums. ?Regular physical activity. ?Eating a healthy diet. ?Avoiding tobacco and drug use. ?Limiting alcohol use. ?Practicing safe sex. ?Taking low doses of aspirin every day. ?Taking vitamin and mineral supplements as recommended by your health care provider. ?What happens during an annual well check? ?The services and screenings done by your health care provider during your annual well check will depend on your age, overall health, lifestyle risk factors, and family history of disease. ?Counseling  ?Your health care provider may ask you questions about your: ?Alcohol use. ?Tobacco use. ?Drug use. ?Emotional well-being. ?Home and relationship well-being. ?Sexual activity. ?Eating habits. ?History of falls. ?Memory and ability to understand (cognition). ?Work and work  Statistician. ?Screening  ?You may have the following tests or measurements: ?Height, weight, and BMI. ?Blood pressure. ?Lipid and cholesterol levels. These may be checked every 5 years, or more frequently if you are over 63 years old. ?Skin check. ?Lung cancer screening. You may have this screening every year starting at age 53 if you have a 30-pack-year history of smoking and currently smoke or have quit within the past 15 years. ?Fecal occult blood test (FOBT) of the stool. You may have this test every year starting at age 80. ?Flexible sigmoidoscopy or colonoscopy. You may have a sigmoidoscopy every 5 years or a colonoscopy every 10 years starting at age 26. ?Prostate cancer screening. Recommendations will vary depending on your family history and other risks. ?Hepatitis C blood test. ?Hepatitis B blood test. ?Sexually transmitted disease (STD) testing. ?Diabetes screening. This is done by checking your blood sugar (glucose) after you have not eaten for a while (fasting). You may have this done every 1-3 years. ?Abdominal aortic aneurysm (AAA) screening. You may need this if you are a current or former smoker. ?Osteoporosis. You may be screened starting at age 22 if you are at high risk. ?Talk with your health care provider about your test results, treatment options, and if necessary, the need for more tests. ?Vaccines  ?Your health care provider may recommend certain vaccines, such as: ?Influenza vaccine. This is recommended every year. ?Tetanus, diphtheria, and acellular pertussis (Tdap, Td) vaccine. You may need a Td booster every 10 years. ?Zoster vaccine. You may need this after age 20. ?Pneumococcal 13-valent conjugate (PCV13) vaccine. One dose is recommended after age 23. ?Pneumococcal polysaccharide (PPSV23) vaccine. One dose is recommended after age 61. ?Talk to your health care provider about which screenings and vaccines you need  and how often you need them. ?This information is not intended to replace  advice given to you by your health care provider. Make sure you discuss any questions you have with your health care provider. ?Document Released: 11/20/2015 Document Revised: 07/13/2016 Document Reviewed: 08/25/2015 ?Elsevier Interactive Patient Education ? 2017 [place]. ? ?Fall Prevention in the Home ?Falls can cause injuries. They can happen to people of all ages. There are many things you can do to make your home safe and to help prevent falls. ?What can I do on the outside of my home? ?Regularly fix the edges of walkways and driveways and fix any cracks. ?Remove anything that might make you trip as you walk through a door, such as a raised step or threshold. ?Trim any bushes or trees on the path to your home. ?Use bright outdoor lighting. ?Clear any walking paths of anything that might make someone trip, such as rocks or tools. ?Regularly check to see if handrails are loose or broken. Make sure that both sides of any steps have handrails. ?Any raised decks and porches should have guardrails on the edges. ?Have any leaves, snow, or ice cleared regularly. ?Use sand or salt on walking paths during winter. ?Clean up any spills in your garage right away. This includes oil or grease spills. ?What can I do in the bathroom? ?Use night lights. ?Install grab bars by the toilet and in the tub and shower. Do not use towel bars as grab bars. ?Use non-skid mats or decals in the tub or shower. ?If you need to sit down in the shower, use a plastic, non-slip stool. ?Keep the floor dry. Clean up any water that spills on the floor as soon as it happens. ?Remove soap buildup in the tub or shower regularly. ?Attach bath mats securely with double-sided non-slip rug tape. ?Do not have throw rugs and other things on the floor that can make you trip. ?What can I do in the bedroom? ?Use night lights. ?Make sure that you have a light by your bed that is easy to reach. ?Do not use any sheets or blankets that are too big for your bed.  They should not hang down onto the floor. ?Have a firm chair that has side arms. You can use this for support while you get dressed. ?Do not have throw rugs and other things on the floor that can make you trip. ?What can I do in the kitchen? ?Clean up any spills right away. ?Avoid walking on wet floors. ?Keep items that you use a lot in easy-to-reach places. ?If you need to reach something above you, use a strong step stool that has a grab bar. ?Keep electrical cords out of the way. ?Do not use floor polish or wax that makes floors slippery. If you must use wax, use non-skid floor wax. ?Do not have throw rugs and other things on the floor that can make you trip. ?What can I do with my stairs? ?Do not leave any items on the stairs. ?Make sure that there are handrails on both sides of the stairs and use them. Fix handrails that are broken or loose. Make sure that handrails are as long as the stairways. ?Check any carpeting to make sure that it is firmly attached to the stairs. Fix any carpet that is loose or worn. ?Avoid having throw rugs at the top or bottom of the stairs. If you do have throw rugs, attach them to the floor with carpet tape. ?Make sure that you  have a light switch at the top of the stairs and the bottom of the stairs. If you do not have them, ask someone to add them for you. ?What else can I do to help prevent falls? ?Wear shoes that: ?Do not have high heels. ?Have rubber bottoms. ?Are comfortable and fit you well. ?Are closed at the toe. Do not wear sandals. ?If you use a stepladder: ?Make sure that it is fully opened. Do not climb a closed stepladder. ?Make sure that both sides of the stepladder are locked into place. ?Ask someone to hold it for you, if possible. ?Clearly mark and make sure that you can see: ?Any grab bars or handrails. ?First and last steps. ?Where the edge of each step is. ?Use tools that help you move around (mobility aids) if they are needed. These  include: ?Canes. ?Walkers. ?Scooters. ?Crutches. ?Turn on the lights when you go into a dark area. Replace any light bulbs as soon as they burn out. ?Set up your furniture so you have a clear path. Avoid moving your furniture around. ?If an

## 2022-03-22 DIAGNOSIS — E7800 Pure hypercholesterolemia, unspecified: Secondary | ICD-10-CM | POA: Diagnosis not present

## 2022-03-22 DIAGNOSIS — Z794 Long term (current) use of insulin: Secondary | ICD-10-CM | POA: Diagnosis not present

## 2022-03-22 DIAGNOSIS — I10 Essential (primary) hypertension: Secondary | ICD-10-CM | POA: Diagnosis not present

## 2022-03-22 DIAGNOSIS — E669 Obesity, unspecified: Secondary | ICD-10-CM | POA: Diagnosis not present

## 2022-03-22 DIAGNOSIS — E1165 Type 2 diabetes mellitus with hyperglycemia: Secondary | ICD-10-CM | POA: Diagnosis not present

## 2022-03-23 DIAGNOSIS — E1165 Type 2 diabetes mellitus with hyperglycemia: Secondary | ICD-10-CM | POA: Diagnosis not present

## 2022-03-25 ENCOUNTER — Ambulatory Visit (HOSPITAL_COMMUNITY): Payer: Medicare HMO | Attending: Cardiology

## 2022-03-25 DIAGNOSIS — Z01818 Encounter for other preprocedural examination: Secondary | ICD-10-CM | POA: Diagnosis not present

## 2022-03-25 DIAGNOSIS — R0602 Shortness of breath: Secondary | ICD-10-CM | POA: Insufficient documentation

## 2022-03-25 LAB — ECHOCARDIOGRAM COMPLETE
Area-P 1/2: 3.45 cm2
S' Lateral: 2.4 cm

## 2022-03-28 ENCOUNTER — Telehealth: Payer: Self-pay | Admitting: Cardiology

## 2022-03-28 DIAGNOSIS — I441 Atrioventricular block, second degree: Secondary | ICD-10-CM

## 2022-03-28 DIAGNOSIS — I4430 Unspecified atrioventricular block: Secondary | ICD-10-CM | POA: Diagnosis not present

## 2022-03-28 NOTE — Telephone Encounter (Signed)
Karl Lawson is calling to report critical monitor from Maximino Sarin  Patient wore monitor from 03/15/22 to 03/18/22. Patient had 38 pauses total.  Longest pause 3.9 seconds, then there was 2 pauses back to back 3.9 seconds, then 3.7 second pause. This can be found on strip 5, page 7. Will forward to Dr. Johney Frame and her nurse.

## 2022-03-28 NOTE — Telephone Encounter (Signed)
  Karl Lawson calling to give critical cardiac result

## 2022-03-28 NOTE — Telephone Encounter (Signed)
Zio report uploaded to Dr. Jacolyn Reedy in-basket to review and advise on.  Report uploaded by monitor tech.  Once report is resulted, we will call the pt shortly thereafter to provide further recommendations.

## 2022-03-29 ENCOUNTER — Telehealth: Payer: Self-pay | Admitting: *Deleted

## 2022-03-29 DIAGNOSIS — I4430 Unspecified atrioventricular block: Secondary | ICD-10-CM

## 2022-03-29 DIAGNOSIS — G4733 Obstructive sleep apnea (adult) (pediatric): Secondary | ICD-10-CM

## 2022-03-29 DIAGNOSIS — R9431 Abnormal electrocardiogram [ECG] [EKG]: Secondary | ICD-10-CM

## 2022-03-29 DIAGNOSIS — R0602 Shortness of breath: Secondary | ICD-10-CM

## 2022-03-29 DIAGNOSIS — I440 Atrioventricular block, first degree: Secondary | ICD-10-CM

## 2022-03-29 DIAGNOSIS — I441 Atrioventricular block, second degree: Secondary | ICD-10-CM

## 2022-03-29 NOTE — Telephone Encounter (Signed)
-----   Message from Freada Bergeron, MD sent at 03/29/2022  8:27 AM EDT ----- Patient called to discuss results. He states he is feeling okay. Denies any chest pain, change in baseline SOB, syncope or presyncopal symptoms. He does not recall triggering the device. He states he may have been napping in the afternoon. Will plan for sleep study to assess for OSA. Currently, no indication for PPM as he denies any symptoms at this time. Will discuss with EP further as initial referral was for SOB.

## 2022-03-29 NOTE — Addendum Note (Signed)
Addended by: Aris Georgia, Evelynn Hench L on: 03/29/2022 08:10 AM   Modules accepted: Orders

## 2022-03-29 NOTE — Telephone Encounter (Signed)
See monitor results for further advisement from Dr. Johney Frame

## 2022-03-29 NOTE — Telephone Encounter (Signed)
Order for home sleep test placed and the pt is aware that our Sleep Study Coordinators will be in contact with him soon to arrange this test.  Pt did endorse to Dr. 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 Will endorse this information to our sleep study coordinator. Pt agreed to this plan.

## 2022-03-30 ENCOUNTER — Telehealth: Payer: Self-pay | Admitting: *Deleted

## 2022-03-30 ENCOUNTER — Encounter: Payer: Self-pay | Admitting: *Deleted

## 2022-03-30 NOTE — Telephone Encounter (Signed)
-----   Message from Nuala Alpha, LPN sent at 9/38/1829  8:56 AM EDT ----- Regarding: home sleep test per Dr. Johney Frame This is the pt Dr. Johney Frame was talking to you about this morning.  He will need a home sleep test done for abnormal heart monitor showing pauses and first degree AV block.  He is working him up for OSA.  He also has sob.   Order is in and pt aware you will help coordinate this test.  Can you please arrange and let me know when scheduled?  Thanks [employer]

## 2022-03-30 NOTE — Telephone Encounter (Signed)
-----   Message from Nuala Alpha, LPN sent at 0/96/2836  8:56 AM EDT ----- Regarding: home sleep test per Dr. Johney Frame This is the pt Dr. Johney Frame was talking to you about this morning.  He will need a home sleep test done for abnormal heart monitor showing pauses and first degree AV block.  He is working him up for OSA.  He also has sob.   Order is in and pt aware you will help coordinate this test.  Can you please arrange and let me know when scheduled?  Thanks [employer]

## 2022-03-30 NOTE — Telephone Encounter (Signed)
-----   Message from Nuala Alpha, LPN sent at 6/65/9935  8:56 AM EDT ----- Regarding: home sleep test per Dr. Johney Frame This is the pt Dr. Johney Frame was talking to you about this morning.  He will need a home sleep test done for abnormal heart monitor showing pauses and first degree AV block.  He is working him up for OSA.  He also has sob.   Order is in and pt aware you will help coordinate this test.  Can you please arrange and let me know when scheduled?  Thanks [employer]

## 2022-04-13 DIAGNOSIS — K219 Gastro-esophageal reflux disease without esophagitis: Secondary | ICD-10-CM | POA: Diagnosis not present

## 2022-04-13 DIAGNOSIS — H547 Unspecified visual loss: Secondary | ICD-10-CM | POA: Diagnosis not present

## 2022-04-13 DIAGNOSIS — I10 Essential (primary) hypertension: Secondary | ICD-10-CM | POA: Diagnosis not present

## 2022-04-13 DIAGNOSIS — J449 Chronic obstructive pulmonary disease, unspecified: Secondary | ICD-10-CM | POA: Diagnosis not present

## 2022-04-13 DIAGNOSIS — E1136 Type 2 diabetes mellitus with diabetic cataract: Secondary | ICD-10-CM | POA: Diagnosis not present

## 2022-04-13 DIAGNOSIS — N3943 Post-void dribbling: Secondary | ICD-10-CM | POA: Diagnosis not present

## 2022-04-13 DIAGNOSIS — I2510 Atherosclerotic heart disease of native coronary artery without angina pectoris: Secondary | ICD-10-CM | POA: Diagnosis not present

## 2022-04-13 DIAGNOSIS — E1140 Type 2 diabetes mellitus with diabetic neuropathy, unspecified: Secondary | ICD-10-CM | POA: Diagnosis not present

## 2022-04-13 DIAGNOSIS — Z794 Long term (current) use of insulin: Secondary | ICD-10-CM | POA: Diagnosis not present

## 2022-04-13 DIAGNOSIS — E785 Hyperlipidemia, unspecified: Secondary | ICD-10-CM | POA: Diagnosis not present

## 2022-04-13 DIAGNOSIS — N400 Enlarged prostate without lower urinary tract symptoms: Secondary | ICD-10-CM | POA: Diagnosis not present

## 2022-04-14 ENCOUNTER — Ambulatory Visit (HOSPITAL_BASED_OUTPATIENT_CLINIC_OR_DEPARTMENT_OTHER): Payer: Medicare HMO | Attending: Cardiology | Admitting: Cardiology

## 2022-04-14 DIAGNOSIS — G4733 Obstructive sleep apnea (adult) (pediatric): Secondary | ICD-10-CM | POA: Insufficient documentation

## 2022-04-14 DIAGNOSIS — G4734 Idiopathic sleep related nonobstructive alveolar hypoventilation: Secondary | ICD-10-CM | POA: Diagnosis not present

## 2022-04-14 DIAGNOSIS — I4430 Unspecified atrioventricular block: Secondary | ICD-10-CM

## 2022-04-14 DIAGNOSIS — I441 Atrioventricular block, second degree: Secondary | ICD-10-CM | POA: Insufficient documentation

## 2022-04-14 DIAGNOSIS — I440 Atrioventricular block, first degree: Secondary | ICD-10-CM | POA: Diagnosis not present

## 2022-04-14 DIAGNOSIS — R9431 Abnormal electrocardiogram [ECG] [EKG]: Secondary | ICD-10-CM | POA: Insufficient documentation

## 2022-04-14 DIAGNOSIS — G4736 Sleep related hypoventilation in conditions classified elsewhere: Secondary | ICD-10-CM | POA: Diagnosis not present

## 2022-04-14 DIAGNOSIS — R0602 Shortness of breath: Secondary | ICD-10-CM | POA: Insufficient documentation

## 2022-04-15 ENCOUNTER — Encounter: Payer: Self-pay | Admitting: Cardiology

## 2022-04-15 ENCOUNTER — Ambulatory Visit: Payer: Medicare HMO | Admitting: Cardiology

## 2022-04-15 VITALS — BP 134/66 | HR 64 | Ht 68.5 in | Wt 239.0 lb

## 2022-04-15 DIAGNOSIS — I441 Atrioventricular block, second degree: Secondary | ICD-10-CM

## 2022-04-15 NOTE — Progress Notes (Signed)
Electrophysiology Office Note   Date:  04/15/2022   ID:  Karl Darting., DOB October 08, 1946, MRN [id number]  PCP:  Libby Maw, MD  Cardiologist:  Johney Frame Primary Electrophysiologist:  Karl Leatherbury Meredith Leeds, MD    Chief Complaint: heart block   History of Present Illness: Karl Bartles. is a 76 y.o. male who is being seen today for the evaluation of heart block at the request of Freada Bergeron, MD. Presenting today for electrophysiology evaluation.  He has a history significant for COPD, hypertension, hyperlipidemia, diabetes.  He presented to cardiology clinic with shortness of breath.  He has a right bundle branch block on his ECG.  He wore a cardiac monitor that showed Mobitz 1 AV block and pauses of up to 3.6 seconds.  Per the patient, he may have been napping at this time.  He is generally not very active.  He has not had episodes of syncope in the last year.  His episode of syncope last occurred when he was drinking a cold beverage.  This was a reproducible event.  Today, he denies symptoms of palpitations, chest pain, shortness of breath, orthopnea, PND, lower extremity edema, claudication, dizziness, presyncope, syncope, bleeding, or neurologic sequela. The patient is tolerating medications without difficulties.    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)   Cataract    COPD (chronic obstructive pulmonary disease) ([place])    Diabetes mellitus    Diverticulosis of colon 11/19/12   left colon, Dr Carlean Purl   GERD (gastroesophageal reflux disease)    History of adenomatous polyps of colon - probable attenuated polyposis 07/17/2008   2004: 2 polyps max 8 mm 1 lost and 1 infammatory 2010: 7 polyps TV and tubular adenomas max 6 mm 11/19/2012 :4 polyps; max 1 cm removed; all adenomas 01/05/2017 8 polyps max 10 mm adenomas recall 2021     Hyperlipidemia    Hypertension    Personal history of adenomatous  colonic polyps 07/17/2008   2004 2 polyps max 8 mm (adenomas) 2010 7 polyps TV and tubular adenomas max 6 mm 11/19/2012     Past Surgical History:  Procedure Laterality Date   APPENDECTOMY  1959   CATARACT EXTRACTION Right    COLONOSCOPY  multiple   adenomatous polyps, Dr Carlean Purl   ESOPHAGOGASTRODUODENOSCOPY  multiple   INGUINAL HERNIA REPAIR Left 1997   TONSILLECTOMY       Current Outpatient Medications  Medication Sig Dispense Refill   amLODipine (NORVASC) 10 MG tablet Take 1 tablet (10 mg total) by mouth daily. 90 tablet 3   aspirin EC 81 MG tablet Take 81 mg by mouth daily.     Blood Glucose Monitoring Suppl (ONE TOUCH ULTRA 2) w/Device KIT Check blood glucose 3 times daily as directed     finasteride (PROSCAR) 5 MG tablet Take 5 mg by mouth daily.     insulin lispro protamine-lispro (HUMALOG 75/25 MIX) (75-25) 100 UNIT/ML SUSP injection Inject 40 Units into the skin 2 (two) times daily.     JARDIANCE 10 MG TABS tablet Take 10 mg by mouth daily.     losartan-hydrochlorothiazide (HYZAAR) 100-25 MG per tablet Take 1 tablet by mouth daily.     metFORMIN (GLUCOPHAGE) 1000 MG tablet TAKE 1 TABLET TWICE A DAY WITH MEALS 60 tablet 0   Multiple Vitamins-Minerals (PRESERVISION AREDS 2 PO) Take 1 capsule by mouth in the morning and at bedtime.  omeprazole (PRILOSEC) 40 MG capsule Take 1 capsule (40 mg total) by mouth daily. 90 capsule 3   ONETOUCH ULTRA test strip      simvastatin (ZOCOR) 40 MG tablet Take 1 tablet (40 mg total) by mouth at bedtime. 90 tablet 3   No current facility-administered medications for this visit.    Allergies:   Patient has no known allergies.   Social History:  The patient  reports that he quit smoking about 9 years ago. His smoking use included cigarettes and e-cigarettes. He has never used smokeless tobacco. He reports current alcohol use. He reports that he does not use drugs.   Family History:  The patient's family history includes Alzheimer's disease  in his mother; Diabetes in his brother; Heart disease in his father and paternal grandfather; Pulmonary embolism in his brother; Stroke (age of onset: 31) in his father.    ROS:  Please see the history of present illness.   Otherwise, review of systems is positive for none.   All other systems are reviewed and negative.    PHYSICAL EXAM: VS:  BP 134/66   Pulse 64   Ht 5' 8.5" (1.74 m)   Wt 239 lb (108.4 kg)   SpO2 93%   BMI 35.81 kg/m  , BMI Body mass index is 35.81 kg/m. GEN: Well nourished, well developed, in no acute distress  HEENT: normal  Neck: no JVD, carotid bruits, or masses Cardiac: RRR; no murmurs, rubs, or gallops,no edema  Respiratory:  clear to auscultation bilaterally, normal work of breathing GI: soft, nontender, nondistended, + BS MS: no deformity or atrophy  Skin: warm and dry Neuro:  Strength and sensation are intact Psych: euthymic mood, full affect  EKG:  EKG is ordered today. Personal review of the ekg ordered shows sinus rhythm, most 1 AV block, right bundle branch block, left anterior fascicular block  Recent Labs: 12/14/2021: ALT 13; BUN 19; Creatinine, Ser 1.10; Hemoglobin 14.7; Platelets 228.0; Potassium 4.3; Sodium 142    Lipid Panel     Component Value Date/Time   CHOL 114 12/14/2021 0909   TRIG 88.0 12/14/2021 0909   HDL 46.40 12/14/2021 0909   CHOLHDL 2 12/14/2021 0909   VLDL 17.6 12/14/2021 0909   LDLCALC 50 12/14/2021 0909   LDLDIRECT 55.0 12/14/2021 0909     Wt Readings from Last 3 Encounters:  04/15/22 239 lb (108.4 kg)  03/11/22 239 lb (108.4 kg)  02/23/22 238 lb 12.8 oz (108.3 kg)      Other studies Reviewed: Additional studies/ records that were reviewed today include: TTE 03/25/22  Review of the above records today demonstrates:   1. Pt in Mobitz 1 second degree AV block at time of study.   2. Left ventricular ejection fraction, by estimation, is 70 to 75%. The  left ventricle has hyperdynamic function. The left ventricle  has no  regional wall motion abnormalities. Left ventricular diastolic parameters  are indeterminate.   3. Right ventricular systolic function is normal. The right ventricular  size is mildly enlarged.   4. Left atrial size was moderately dilated.   5. The mitral valve is normal in structure. No evidence of mitral valve  regurgitation. No evidence of mitral stenosis.   6. The aortic valve is tricuspid. Aortic valve regurgitation is not  visualized. Aortic valve sclerosis is present, with no evidence of aortic  valve stenosis.   7. The inferior vena cava is dilated in size with >50% respiratory  variability, suggesting right atrial pressure of  8 mmHg.   Monitor 03/29/2022 personally reviewed Patch wear time was 2 days and 16 hours Predominant rhythm was sinus with first degree AVB There were 38 pauses present with longest lasting 3.9s at 2:06pm Wenkebach was present and was associated with patient triggered event Rare SVE (<1%), rare VE (<1%)  ASSESSMENT AND PLAN:  1.  Mobitz 1 second-degree AV block: Wore a cardiac monitor that also showed pauses as well as Mobitz 1 AV block.  The patient is minimally symptomatic.  When he had his pauses, he did not have any syncope or near syncope.  He does have a history of syncope, but these occur when he drinks cold liquids.  At this point, as he has not had syncope, we Effa Yarrow hold off on pacemaker implant.  Would avoid all AV nodal blockers.  If he does have any symptoms of syncope or near syncope as well as shortness of breath that can be correlated to bradycardia, pacemaker implant would be indicated.  Case discussed with primary cardiology   Current medicines are reviewed at length with the patient today.   The patient does not have concerns regarding his medicines.  The following changes were made today:  none  Labs/ tests ordered today include:  No orders of the defined types were placed in this encounter.    Disposition:   FU with Kellsey Sansone PRN  Signed, Karl Lowder Meredith Leeds, MD  04/15/2022 12:14 PM     [address] [place] [phone] (office) [phone] (fax)

## 2022-04-18 NOTE — Procedures (Signed)
   Patient Name: Karl Lawson, Karl Lawson Date: 04/14/2022 Gender: Male D.O.B: 10-03-1946 Age (years): 51 Referring Provider: Gwyndolyn Kaufman MD Height (inches): 80 Interpreting Physician: Fransico Him MD, ABSM Weight (lbs): 242 RPSGT: Jacolyn Reedy BMI: 37 MRN: [id number] Neck Size: 17.75  CLINICAL INFORMATION Sleep Study Type: HST  Indication for sleep study: N/A  Epworth Sleepiness Score: 11  SLEEP STUDY TECHNIQUE A multi-channel overnight portable sleep study was performed. The channels recorded were: nasal airflow, thoracic respiratory movement, and oxygen saturation with a pulse oximetry. Snoring was also monitored.  MEDICATIONS Patient self administered medications include: N/A.  SLEEP ARCHITECTURE Patient was studied for 369.5 minutes. The sleep efficiency was 100.0 % and the patient was supine for 0%. The arousal index was 0.0 per hour.  RESPIRATORY PARAMETERS The overall AHI was 4.4 per hour, with a central apnea index of 0 per hour.  The oxygen nadir was 86% during sleep.  CARDIAC DATA Mean heart rate during sleep was 48.6 bpm.  IMPRESSIONS - No significant obstructive sleep apnea occurred during this study (AHI = 4.4/h). - Moderate oxygen desaturation was noted during this study (Min O2 = 86%). - Patient snored 2.5% during the sleep.  DIAGNOSIS - Nocturnal Hypoxemia (G47.36)  RECOMMENDATIONS - Due to nocturnal hypoxemia, recommend in lab PSG. - Positional therapy avoiding supine position during sleep. - Avoid alcohol, sedatives and other CNS depressants that may worsen sleep apnea and disrupt normal sleep architecture. - Sleep hygiene should be reviewed to assess factors that may improve sleep quality. - Weight management and regular exercise should be initiated or continued.  [Electronically signed] 04/18/2022 11:09 AM  Fransico Him MD, ABSM Diplomate, American Board of Sleep Medicine

## 2022-04-23 DIAGNOSIS — E1165 Type 2 diabetes mellitus with hyperglycemia: Secondary | ICD-10-CM | POA: Diagnosis not present

## 2022-04-25 ENCOUNTER — Telehealth (HOSPITAL_COMMUNITY): Payer: Self-pay | Admitting: Emergency Medicine

## 2022-04-25 NOTE — Telephone Encounter (Signed)
Reaching out to patient to offer assistance regarding upcoming cardiac imaging study; pt verbalizes understanding of appt date/time, parking situation and where to check in, pre-test NPO status and medications ordered, and verified current allergies; name and call back number provided for further questions should they arise Karl Bond RN Navigator Cardiac Imaging [facility] [phone] office [phone] cell   Fast 3 hr No caffeine 12 hr Holding diabetes meds

## 2022-04-26 ENCOUNTER — Ambulatory Visit (HOSPITAL_COMMUNITY)
Admission: RE | Admit: 2022-04-26 | Discharge: 2022-04-26 | Disposition: A | Discharge status: Home / Self Care | Payer: Medicare HMO | Source: Ambulatory Visit | Attending: Cardiology | Admitting: Cardiology

## 2022-04-26 DIAGNOSIS — R0602 Shortness of breath: Secondary | ICD-10-CM | POA: Diagnosis not present

## 2022-04-26 DIAGNOSIS — Z01818 Encounter for other preprocedural examination: Secondary | ICD-10-CM | POA: Diagnosis not present

## 2022-04-26 LAB — NM PET CT CARDIAC PERFUSION MULTI W/ABSOLUTE BLOODFLOW
MBFR: 1.38
Nuc Rest EF: 69 %
Nuc Stress EF: 71 %
Rest MBF: 1.18 ml/g/min
Rest Nuclear Isotope Dose: 27.8 mCi
ST Depression (mm): 0 mm
Stress MBF: 1.63 ml/g/min
Stress Nuclear Isotope Dose: 27.9 mCi

## 2022-04-26 MED ORDER — REGADENOSON 0.4 MG/5ML IV SOLN
INTRAVENOUS | Status: AC
  Administered 2022-04-26: 0.4 mg via INTRAVENOUS
  Filled 2022-04-26: qty 5

## 2022-04-26 MED ORDER — RUBIDIUM RB82 GENERATOR (RUBYFILL)
25.0000 | PACK | Freq: Once | INTRAVENOUS | Status: AC
  Administered 2022-04-26: 27.8 via INTRAVENOUS

## 2022-04-26 MED ORDER — RUBIDIUM RB82 GENERATOR (RUBYFILL)
25.0000 | PACK | Freq: Once | INTRAVENOUS | Status: AC
  Administered 2022-04-26: 27.9 via INTRAVENOUS

## 2022-05-02 ENCOUNTER — Telehealth: Payer: Self-pay | Admitting: *Deleted

## 2022-05-03 ENCOUNTER — Ambulatory Visit: Payer: Medicare HMO | Admitting: Physician Assistant

## 2022-05-03 ENCOUNTER — Encounter: Payer: Self-pay | Admitting: Physician Assistant

## 2022-05-03 VITALS — BP 128/76 | HR 83 | Ht 68.5 in | Wt 233.0 lb

## 2022-05-03 DIAGNOSIS — E782 Mixed hyperlipidemia: Secondary | ICD-10-CM

## 2022-05-03 DIAGNOSIS — J410 Simple chronic bronchitis: Secondary | ICD-10-CM | POA: Diagnosis not present

## 2022-05-03 DIAGNOSIS — I441 Atrioventricular block, second degree: Secondary | ICD-10-CM | POA: Diagnosis not present

## 2022-05-03 DIAGNOSIS — R9439 Abnormal result of other cardiovascular function study: Secondary | ICD-10-CM

## 2022-05-03 DIAGNOSIS — I10 Essential (primary) hypertension: Secondary | ICD-10-CM | POA: Diagnosis not present

## 2022-05-03 NOTE — H&P (View-Only) (Signed)
Cardiology Office Note:    Date:  05/03/2022   ID:  Mallie Darting., DOB Feb 26, 1946, MRN [id number]  PCP:  Libby Maw, MD  [facility] Cardiologist:  Freada Bergeron, MD  [facility] Electrophysiologist:  Will Meredith Leeds, MD   Chief Complaint: Cardiac PET follow-up  History of Present Illness:    Mcdaniel Ohms. is a 76 y.o. male with a hx of COPD, hypertension, hyperlipidemia , Mobitz 1 and diabetes mellitus patient's for follow-up.  Patient was seen by Dr. Grandville Silos in 02/2022 for preoperative evaluation. At that time, he was borderline hypoxic with SpO2 88-92%. ECG also demonstrated RBBB and LAFB and likely Mobitz I. Given concern for SOB and ECG findings, he was recommended for CV evaluation.  He was seen by Dr. Johney Frame 03/11/2022.  Has chronic shortness of breath. -Echocardiogram showed LV function of 70 to 75%, no regional wall motion abnormality.  Normal RV function. -Monitor predominantly sinus rhythm, multiple pause with longest 3.9 seconds at 2:06 p.m., Wenkebach  with triggered event  Seen by Dr. Curt Bears and felt no need for pacemaker currently due to lack of syncope or significant symptoms -Cardiac PET scan came back abnormal with reduced blood flow and evidence of severe coronary calcification  Here today for further discussion.  Patient denies any chest pain, shortness of breath, orthopnea, PND, syncope, lower extremity edema or melena.  He currently use vapors.  Prior history of 40-pack-year tobacco smoking.  Quit about 10 to 15 years ago.  Reports his wife in the nursing home and under a lot of stress.  Also dealing with broken car.  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)   Cataract    COPD (chronic obstructive pulmonary disease) ([place])    Diabetes mellitus    Diverticulosis of colon 11/19/12   left colon, Dr Carlean Purl   GERD (gastroesophageal reflux disease)    History of  adenomatous polyps of colon - probable attenuated polyposis 07/17/2008   2004: 2 polyps max 8 mm 1 lost and 1 infammatory 2010: 7 polyps TV and tubular adenomas max 6 mm 11/19/2012 :4 polyps; max 1 cm removed; all adenomas 01/05/2017 8 polyps max 10 mm adenomas recall 2021     Hyperlipidemia    Hypertension    Personal history of adenomatous colonic polyps 07/17/2008   2004 2 polyps max 8 mm (adenomas) 2010 7 polyps TV and tubular adenomas max 6 mm 11/19/2012      Past Surgical History:  Procedure Laterality Date   APPENDECTOMY  1959   CATARACT EXTRACTION Right    COLONOSCOPY  multiple   adenomatous polyps, Dr Carlean Purl   ESOPHAGOGASTRODUODENOSCOPY  multiple   INGUINAL HERNIA REPAIR Left 1997   TONSILLECTOMY      Current Medications: Current Meds  Medication Sig   amLODipine (NORVASC) 10 MG tablet Take 1 tablet (10 mg total) by mouth daily.   aspirin EC 81 MG tablet Take 81 mg by mouth daily.   Blood Glucose Monitoring Suppl (ONE TOUCH ULTRA 2) w/Device KIT Check blood glucose 3 times daily as directed   finasteride (PROSCAR) 5 MG tablet Take 5 mg by mouth daily.   insulin lispro protamine-lispro (HUMALOG 75/25 MIX) (75-25) 100 UNIT/ML SUSP injection Inject 40 Units into the skin 2 (two) times daily.   JARDIANCE 10 MG TABS tablet Take 10 mg by mouth daily.   losartan-hydrochlorothiazide (HYZAAR) 100-25 MG per tablet Take 1 tablet  by mouth daily.   metFORMIN (GLUCOPHAGE) 1000 MG tablet TAKE 1 TABLET TWICE A DAY WITH MEALS   Multiple Vitamins-Minerals (PRESERVISION AREDS 2 PO) Take 1 capsule by mouth in the morning and at bedtime.   omeprazole (PRILOSEC) 40 MG capsule Take 1 capsule (40 mg total) by mouth daily.   ONETOUCH ULTRA test strip    simvastatin (ZOCOR) 40 MG tablet Take 1 tablet (40 mg total) by mouth at bedtime.     Allergies:   Patient has no known allergies.   Social History   Socioeconomic History   Marital status: Married    Spouse name: Not on file   Number of  children: 2   Years of education: 14   Highest education level: Not on file  Occupational History   Occupation: Retired - Mostly  Tobacco Use   Smoking status: Former    Types: Cigarettes, E-cigarettes    Quit date: 10/31/2012    Years since quitting: 9.5   Smokeless tobacco: Never   Tobacco comments:    smoked ages [age], up to 1 ppd . Smoked 2-3 cigars/ day [age]  Vaping Use   Vaping Use: Every day  Substance and Sexual Activity   Alcohol use: Yes    Comment: occasionally   Drug use: No   Sexual activity: Not on file  Other Topics Concern   Not on file  Social History Narrative   Fun: Watching tv, working in the yard and planting in the garden.    Social Determinants of Health   Financial Resource Strain: Low Risk  (03/16/2022)   Overall Financial Resource Strain (CARDIA)    Difficulty of Paying Living Expenses: Not hard at all  Food Insecurity: No Food Insecurity (03/16/2022)   Hunger Vital Sign    Worried About Running Out of Food in the Last Year: Never true    Ran Out of Food in the Last Year: Never true  Transportation Needs: No Transportation Needs (03/16/2022)   PRAPARE - Hydrologist (Medical): No    Lack of Transportation (Non-Medical): No  Physical Activity: Insufficiently Active (03/16/2022)   Exercise Vital Sign    Days of Exercise per Week: 3 days    Minutes of Exercise per Session: 30 min  Stress: No Stress Concern Present (03/16/2022)   [place]    Feeling of Stress : Not at all  Social Connections: Moderately Integrated (03/16/2022)   Social Connection and Isolation Panel [NHANES]    Frequency of Communication with Friends and Family: Three times a week    Frequency of Social Gatherings with Friends and Family: Three times a week    Attends Religious Services: More than 4 times per year    Active Member of Clubs or Organizations: No    Attends Theatre manager Meetings: Never    Marital Status: Married     Family History: The patient's family history includes Alzheimer's disease in his mother; Diabetes in his brother; Heart disease in his father and paternal grandfather; Pulmonary embolism in his brother; Stroke (age of onset: 56) in his father. There is no history of Asthma, Colon cancer, Esophageal cancer, Pancreatic cancer, Prostate cancer, Rectal cancer, or Stomach cancer.    ROS:   Please see the history of present illness.    All other systems reviewed and are negative.   EKGs/Labs/Other Studies Reviewed:    The following studies were reviewed today:  Cardiac NM PET The  study is abnormal. The study is high risk. Despite normal relative perfusion, there is low myocardial blood flow reserve (1.38).  No TID or drop in EF with stress.  Differential includes multivessel CAD, microvascular disease, or nonresponse to vasodilation.  In setting of severe coronary calcifications, recommend further evaluation with cardiac catheterization to exclude multivessel CAD.   LV perfusion is normal. There is no evidence of ischemia. There is no evidence of infarction.   Rest left ventricular function is normal. Rest EF: 69 %. Stress left ventricular function is normal. Stress EF: 71 %. End diastolic cavity size is normal. End systolic cavity size is normal.   Myocardial blood flow was computed to be 1.78m/g/min at rest and 1.676mg/min at stress. Global myocardial blood flow reserve was 1.38 and was abnormal.   Coronary calcium was present on the attenuation correction CT images. Severe coronary calcifications were present. Coronary calcifications were present in the left anterior descending artery, left circumflex artery and right coronary artery distribution(s).   Electronically signed by ChOswaldo MilianMD  Echo 03/25/22 1. Pt in Mobitz 1 second degree AV block at time of study.   2. Left ventricular ejection fraction, by estimation, is 70  to 75%. The  left ventricle has hyperdynamic function. The left ventricle has no  regional wall motion abnormalities. Left ventricular diastolic parameters  are indeterminate.   3. Right ventricular systolic function is normal. The right ventricular  size is mildly enlarged.   4. Left atrial size was moderately dilated.   5. The mitral valve is normal in structure. No evidence of mitral valve  regurgitation. No evidence of mitral stenosis.   6. The aortic valve is tricuspid. Aortic valve regurgitation is not  visualized. Aortic valve sclerosis is present, with no evidence of aortic  valve stenosis.   7. The inferior vena cava is dilated in size with >50% respiratory  variability, suggesting right atrial pressure of 8 mmHg.   Comparison(s): No prior Echocardiogram  Monitor 03/28/22 Patch wear time was 2 days and 16 hours Predominant rhythm was sinus with first degree AVB There were 38 pauses present with longest lasting 3.9s at 2:06pm Wenkebach was present and was associated with patient triggered event Rare SVE (<1%), rare VE (<1%)     Patch Wear Time:  2 days and 16 hours (2023-05-09T22:55:57-0400 to 2023-05-12T15:08:11-398)   Patient had a min HR of 22 bpm, max HR of 112 bpm, and avg HR of 67 bpm. Predominant underlying rhythm was Sinus Rhythm. First Degree AV Block was present. Bundle Branch Block/IVCD was present. 38 Pauses occurred, the longest lasting 3.9 secs (15 bpm).  Some Pauses occurred due to Second Degree AV Block-Mobitz I (Wenckebach). Junctional Rhythm was present. Second Degree AV Block-Mobitz I (Wenckebach) was present. Wenckebach was detected within +/- 45 seconds of symptomatic patient event(s). No Isolated  SVEs, SVE Couplets, or SVE Triplets were present. Isolated VEs were rare (<1.0%), and no VE Couplets or VE Triplets were present. Inverted QRS complexes possibly due to inverted placement of device. Difficulty discerning atrial activity making definitive   diagnosis difficult to ascertain. MD notification criteria for Pauses met - report posted prior to notification per account request ([facility]   HeGwyndolyn KaufmanMD  EKG:  EKG is  ordered today.  The ekg ordered today demonstrates NSR Bifascicular block  Recent Labs: 12/14/2021: ALT 13; BUN 19; Creatinine, Ser 1.10; Hemoglobin 14.7; Platelets 228.0; Potassium 4.3; Sodium 142  Recent Lipid Panel    Component Value Date/Time   CHOL  114 12/14/2021 0909   TRIG 88.0 12/14/2021 0909   HDL 46.40 12/14/2021 0909   CHOLHDL 2 12/14/2021 0909   VLDL 17.6 12/14/2021 0909   LDLCALC 50 12/14/2021 0909   LDLDIRECT 55.0 12/14/2021 0909   Physical Exam:    VS:  BP 128/76   Pulse 83   Ht 5' 8.5" (1.74 m)   Wt 233 lb (105.7 kg)   SpO2 98%   BMI 34.91 kg/m     Wt Readings from Last 3 Encounters:  05/03/22 233 lb (105.7 kg)  04/15/22 239 lb (108.4 kg)  03/11/22 239 lb (108.4 kg)     GEN:  Well nourished, well developed in no acute distress HEENT: Normal NECK: No JVD; No carotid bruits LYMPHATICS: No lymphadenopathy CARDIAC: RRR, no murmurs, rubs, gallops RESPIRATORY:  Clear to auscultation without rales, wheezing or rhonchi  ABDOMEN: Soft, non-tender, non-distended MUSCULOSKELETAL:  No edema; No deformity  SKIN: Warm and dry NEUROLOGIC:  Alert and oriented x 3 PSYCHIATRIC:  Normal affect   ASSESSMENT AND PLAN:    CAD cardiac PET scan came abnormal with reduced blood flow and evidence of severe coronary calcification.  Denies any cardiac symptoms.  Shared Decision Making/Informed Consent The risks [stroke (1 in 1000), death (1 in 1000), kidney failure [usually temporary] (1 in 500), bleeding (1 in 200), allergic reaction [possibly serious] (1 in 200)], benefits (diagnostic support and management of coronary artery disease) and alternatives of a cardiac catheterization were discussed in detail with Mr. Revoir and he is willing to proceed.  - Continue ASA and statin   2. Sinus  pause/Bradycardia -Monitor with Mobitz 1 AV block and pauses of up to 3.6 seconds -Dr. Curt Bears felt no need for pacemaker currently as patient is minimally symptomatic.  No history of syncope. -Avoid AV nodal blocking agent  3. HLD -12/14/2021: Cholesterol 114; HDL 46.40; LDL Cholesterol 50; Triglycerides 88.0; VLDL 17.6  - Continue Zocor. Adjustment based on cath findings  4. Pre op clearance - Has pending hernia repair  Medication Adjustments/Labs and Tests Ordered: Current medicines are reviewed at length with the patient today.  Concerns regarding medicines are outlined above.  Orders Placed This Encounter  Procedures   Basic Metabolic Panel (BMET)   CBC   EKG 12-Lead   No orders of the defined types were placed in this encounter.   Patient Instructions  Medication Instructions:  Your physician recommends that you continue on your current medications as directed. Please refer to the Current Medication list given to you today. *If you need a refill on your cardiac medications before your next appointment, please call your pharmacy*   Lab Work: NONE ORDERED; DO LABS FOR YOUR CATH AT [place] If you have labs (blood work) drawn today and your tests are completely normal, you will receive your results only by: [place] (if you have MyChart) OR A paper copy in the mail If you have any lab test that is abnormal or we need to change your treatment, we will call you to review the results.   Testing/Procedures: Your physician has requested that you have a cardiac catheterization. Cardiac catheterization is used to diagnose and/or treat various heart conditions. Doctors may recommend this procedure for a number of different reasons. The most common reason is to evaluate chest pain. Chest pain can be a symptom of coronary artery disease (CAD), and cardiac catheterization can show whether plaque is narrowing or blocking your heart's arteries. This procedure is also used to  evaluate the  valves, as well as measure the blood flow and oxygen levels in different parts of your heart. For further information please visit [email or web address].[email or web address]. Please follow instruction sheet, as given. SEE INSTRUCTIONS BELOW   Follow-Up: At [facility], you and your health needs are our priority.  As part of our continuing mission to provide you with exceptional heart care, we have created designated Provider Care Teams.  These Care Teams include your primary Cardiologist (physician) and Advanced Practice Providers (APPs -  Physician Assistants and Nurse Practitioners) who all work together to provide you with the care you need, when you need it.  We recommend signing up for the patient portal called "MyChart".  Sign up information is provided on this After Visit Summary.  MyChart is used to connect with patients for Virtual Visits (Telemedicine).  Patients are able to view lab/test results, encounter notes, upcoming appointments, etc.  Non-urgent messages can be sent to your provider as well.   To learn more about what you can do with MyChart, go to [email or web address].    Your next appointment:   2 week(s)  The format for your next appointment:   In Person  Provider:   Freada Bergeron, MD  or [address] PA-C, Nicholes Rough, PA-C, Melina Copa, PA-C, Ambrose Pancoast, NP, Cecilie Kicks, NP, Ermalinda Barrios, PA-C, Christen Bame, NP, or Richardson Dopp, PA-C         Other Instructions   Important Information About Sugar           Signed, Leanor Kail, [place]  05/03/2022 4:21 PM    [place]

## 2022-05-09 ENCOUNTER — Telehealth: Payer: Self-pay

## 2022-05-09 NOTE — Telephone Encounter (Signed)
Contacted the patient to go over cath instructions. Patient voiced understanding.   Copy of lab letter sent to patient via mychart.

## 2022-05-17 ENCOUNTER — Other Ambulatory Visit: Payer: Medicare HMO

## 2022-05-17 DIAGNOSIS — R9439 Abnormal result of other cardiovascular function study: Secondary | ICD-10-CM | POA: Diagnosis not present

## 2022-05-18 LAB — CBC
Hematocrit: 44.1 % (ref 37.5–51.0)
Hemoglobin: 14.1 g/dL (ref 13.0–17.7)
MCH: 25.8 pg — ABNORMAL LOW (ref 26.6–33.0)
MCHC: 32 g/dL (ref 31.5–35.7)
MCV: 81 fL (ref 79–97)
Platelets: 240 10*3/uL (ref 150–450)
RBC: 5.47 x10E6/uL (ref 4.14–5.80)
RDW: 16.3 % — ABNORMAL HIGH (ref 11.6–15.4)
WBC: 7.7 10*3/uL (ref 3.4–10.8)

## 2022-05-18 LAB — BASIC METABOLIC PANEL
BUN/Creatinine Ratio: 11 (ref 10–24)
BUN: 10 mg/dL (ref 8–27)
CO2: 22 mmol/L (ref 20–29)
Calcium: 9.7 mg/dL (ref 8.6–10.2)
Chloride: 105 mmol/L (ref 96–106)
Creatinine, Ser: 0.93 mg/dL (ref 0.76–1.27)
Glucose: 139 mg/dL — ABNORMAL HIGH (ref 70–99)
Potassium: 3.5 mmol/L (ref 3.5–5.2)
Sodium: 142 mmol/L (ref 134–144)
eGFR: 85 mL/min/{1.73_m2} (ref >59)

## 2022-05-23 ENCOUNTER — Telehealth: Payer: Self-pay | Admitting: *Deleted

## 2022-05-23 DIAGNOSIS — E1165 Type 2 diabetes mellitus with hyperglycemia: Secondary | ICD-10-CM | POA: Diagnosis not present

## 2022-05-23 NOTE — Telephone Encounter (Signed)
Cardiac Catheterization scheduled at [facility] for: Tuesday May 24, 2022 9 AM Arrival time and place: [facility] Main Entrance A at: 7 AM   Nothing to eat after midnight prior to procedure, clear liquids until 5 AM day of procedure.  Medication instructions: -Hold:  Metformin-none day of procedure and 48 hours post procedure  Jardiance-AM of procedure  Insulin-AM of procedure/1/2 usual dose HS of procedure  -Except hold medications usual morning medications can be taken with sips of water including aspirin 81 mg.  Confirmed patient has responsible adult to drive home post procedure and be with patient first 24 hours after arriving home.  Patient reports no new symptoms concerning for COVID-19 in the past 10 days.  Reviewed procedure instructions with patient.

## 2022-05-23 NOTE — Telephone Encounter (Signed)
Error

## 2022-05-24 ENCOUNTER — Encounter (HOSPITAL_COMMUNITY)
Admission: RE | Disposition: A | Discharge status: Home / Self Care | Payer: Self-pay | Source: Home / Self Care | Attending: Interventional Cardiology

## 2022-05-24 ENCOUNTER — Encounter (HOSPITAL_COMMUNITY): Payer: Self-pay | Admitting: Interventional Cardiology

## 2022-05-24 ENCOUNTER — Other Ambulatory Visit: Payer: Self-pay

## 2022-05-24 ENCOUNTER — Ambulatory Visit (HOSPITAL_COMMUNITY)
Admission: RE | Admit: 2022-05-24 | Discharge: 2022-05-24 | Disposition: A | Discharge status: Home / Self Care | Payer: Medicare HMO | Attending: Interventional Cardiology | Admitting: Interventional Cardiology

## 2022-05-24 DIAGNOSIS — I440 Atrioventricular block, first degree: Secondary | ICD-10-CM | POA: Diagnosis not present

## 2022-05-24 DIAGNOSIS — R9439 Abnormal result of other cardiovascular function study: Secondary | ICD-10-CM | POA: Insufficient documentation

## 2022-05-24 DIAGNOSIS — Z87891 Personal history of nicotine dependence: Secondary | ICD-10-CM | POA: Insufficient documentation

## 2022-05-24 DIAGNOSIS — E119 Type 2 diabetes mellitus without complications: Secondary | ICD-10-CM | POA: Insufficient documentation

## 2022-05-24 DIAGNOSIS — I2510 Atherosclerotic heart disease of native coronary artery without angina pectoris: Secondary | ICD-10-CM | POA: Diagnosis not present

## 2022-05-24 DIAGNOSIS — Z7984 Long term (current) use of oral hypoglycemic drugs: Secondary | ICD-10-CM | POA: Insufficient documentation

## 2022-05-24 DIAGNOSIS — E785 Hyperlipidemia, unspecified: Secondary | ICD-10-CM | POA: Insufficient documentation

## 2022-05-24 DIAGNOSIS — I10 Essential (primary) hypertension: Secondary | ICD-10-CM | POA: Insufficient documentation

## 2022-05-24 DIAGNOSIS — Z794 Long term (current) use of insulin: Secondary | ICD-10-CM | POA: Insufficient documentation

## 2022-05-24 DIAGNOSIS — J449 Chronic obstructive pulmonary disease, unspecified: Secondary | ICD-10-CM | POA: Insufficient documentation

## 2022-05-24 HISTORY — PX: LEFT HEART CATH AND CORONARY ANGIOGRAPHY: CATH118249

## 2022-05-24 LAB — GLUCOSE, CAPILLARY
Glucose-Capillary: 119 mg/dL — ABNORMAL HIGH (ref 70–99)
Glucose-Capillary: 135 mg/dL — ABNORMAL HIGH (ref 70–99)

## 2022-05-24 SURGERY — LEFT HEART CATH AND CORONARY ANGIOGRAPHY
Anesthesia: LOCAL

## 2022-05-24 MED ORDER — SODIUM CHLORIDE 0.9 % IV SOLN: 250.0000 mL | INTRAVENOUS | Status: DC | PRN

## 2022-05-24 MED ORDER — ASPIRIN 81 MG PO CHEW: 81.0000 mg | CHEWABLE_TABLET | ORAL | Status: DC

## 2022-05-24 MED ORDER — MIDAZOLAM HCL 2 MG/2ML IJ SOLN
INTRAMUSCULAR | Status: AC
  Filled 2022-05-24: qty 2

## 2022-05-24 MED ORDER — SODIUM CHLORIDE 0.9 % WEIGHT BASED INFUSION
3.0000 mL/kg/h | INTRAVENOUS | Status: AC
  Administered 2022-05-24: 3 mL/kg/h via INTRAVENOUS

## 2022-05-24 MED ORDER — ACETAMINOPHEN 325 MG PO TABS: 650.0000 mg | ORAL_TABLET | ORAL | Status: DC | PRN

## 2022-05-24 MED ORDER — SODIUM CHLORIDE 0.9% FLUSH: 3.0000 mL | Freq: Two times a day (BID) | INTRAVENOUS | Status: DC

## 2022-05-24 MED ORDER — HEPARIN SODIUM (PORCINE) 1000 UNIT/ML IJ SOLN
INTRAMUSCULAR | Status: DC | PRN
  Administered 2022-05-24: 5000 [IU] via INTRAVENOUS

## 2022-05-24 MED ORDER — HEPARIN SODIUM (PORCINE) 1000 UNIT/ML IJ SOLN
INTRAMUSCULAR | Status: AC
  Filled 2022-05-24: qty 10

## 2022-05-24 MED ORDER — SODIUM CHLORIDE 0.9% FLUSH: 3.0000 mL | INTRAVENOUS | Status: DC | PRN

## 2022-05-24 MED ORDER — FENTANYL CITRATE (PF) 100 MCG/2ML IJ SOLN
INTRAMUSCULAR | Status: DC | PRN
  Administered 2022-05-24: 25 ug via INTRAVENOUS

## 2022-05-24 MED ORDER — ONDANSETRON HCL 4 MG/2ML IJ SOLN: 4.0000 mg | Freq: Four times a day (QID) | INTRAMUSCULAR | Status: DC | PRN

## 2022-05-24 MED ORDER — METFORMIN HCL 1000 MG PO TABS: 1000.0000 mg | ORAL_TABLET | Freq: Two times a day (BID) | ORAL | 0 refills | Status: DC

## 2022-05-24 MED ORDER — SODIUM CHLORIDE 0.9 % IV SOLN: INTRAVENOUS | Status: AC

## 2022-05-24 MED ORDER — LABETALOL HCL 5 MG/ML IV SOLN: 10.0000 mg | INTRAVENOUS | Status: DC | PRN

## 2022-05-24 MED ORDER — HYDRALAZINE HCL 20 MG/ML IJ SOLN: 10.0000 mg | INTRAMUSCULAR | Status: DC | PRN

## 2022-05-24 MED ORDER — HEPARIN (PORCINE) IN NACL 1000-0.9 UT/500ML-% IV SOLN
INTRAVENOUS | Status: DC | PRN
  Administered 2022-05-24 (×2): 500 mL

## 2022-05-24 MED ORDER — VERAPAMIL HCL 2.5 MG/ML IV SOLN
INTRAVENOUS | Status: AC
  Filled 2022-05-24: qty 2

## 2022-05-24 MED ORDER — MIDAZOLAM HCL 2 MG/2ML IJ SOLN
INTRAMUSCULAR | Status: DC | PRN
  Administered 2022-05-24 (×2): 1 mg via INTRAVENOUS

## 2022-05-24 MED ORDER — FENTANYL CITRATE (PF) 100 MCG/2ML IJ SOLN
INTRAMUSCULAR | Status: AC
  Filled 2022-05-24: qty 2

## 2022-05-24 MED ORDER — LIDOCAINE HCL (PF) 1 % IJ SOLN
INTRAMUSCULAR | Status: AC
  Filled 2022-05-24: qty 30

## 2022-05-24 MED ORDER — IOHEXOL 350 MG/ML SOLN
INTRAVENOUS | Status: DC | PRN
  Administered 2022-05-24: 55 mL

## 2022-05-24 MED ORDER — VERAPAMIL HCL 2.5 MG/ML IV SOLN
INTRAVENOUS | Status: DC | PRN
  Administered 2022-05-24: 10 mL via INTRA_ARTERIAL

## 2022-05-24 MED ORDER — LIDOCAINE HCL (PF) 1 % IJ SOLN
INTRAMUSCULAR | Status: DC | PRN
  Administered 2022-05-24: 2 mL

## 2022-05-24 MED ORDER — SODIUM CHLORIDE 0.9 % WEIGHT BASED INFUSION: 1.0000 mL/kg/h | INTRAVENOUS | Status: DC

## 2022-05-24 SURGICAL SUPPLY — 9 items
BAND ZEPHYR COMPRESS 30 LONG (HEMOSTASIS) ×1 IMPLANT
CATH 5FR JL3.5 JR4 ANG PIG MP (CATHETERS) ×1 IMPLANT
GLIDESHEATH SLEND SS 6F .021 (SHEATH) ×1 IMPLANT
GUIDEWIRE INQWIRE 1.5J.035X260 (WIRE) IMPLANT
INQWIRE 1.5J .035X260CM (WIRE) ×2
KIT HEART LEFT (KITS) ×2 IMPLANT
PACK CARDIAC CATHETERIZATION (CUSTOM PROCEDURE TRAY) ×2 IMPLANT
TRANSDUCER W/STOPCOCK (MISCELLANEOUS) ×2 IMPLANT
TUBING CIL FLEX 10 FLL-RA (TUBING) ×2 IMPLANT

## 2022-05-24 NOTE — Interval H&P Note (Signed)
Cath Lab Visit (complete for each Cath Lab visit)  Clinical Evaluation Leading to the Procedure:   ACS: No.  Non-ACS:    Anginal Classification: CCS I  Anti-ischemic medical therapy: Minimal Therapy (1 class of medications)  Non-Invasive Test Results: High-risk stress test findings: cardiac mortality >3%/year  Prior CABG: No previous CABG  Abnormal PET/CT    History and Physical Interval Note:  05/24/2022 8:26 AM  Karl Lawson.  has presented today for surgery, with the diagnosis of abnormal stress test.  The various methods of treatment have been discussed with the patient and family. After consideration of risks, benefits and other options for treatment, the patient has consented to  Procedure(s): LEFT HEART CATH AND CORONARY ANGIOGRAPHY (N/A) as a surgical intervention.  The patient's history has been reviewed, patient examined, no change in status, stable for surgery.  I have reviewed the patient's chart and labs.  Questions were answered to the patient's satisfaction.     Larae Grooms

## 2022-06-01 DIAGNOSIS — H353123 Nonexudative age-related macular degeneration, left eye, advanced atrophic without subfoveal involvement: Secondary | ICD-10-CM | POA: Diagnosis not present

## 2022-06-01 DIAGNOSIS — H353112 Nonexudative age-related macular degeneration, right eye, intermediate dry stage: Secondary | ICD-10-CM | POA: Diagnosis not present

## 2022-06-01 DIAGNOSIS — E113293 Type 2 diabetes mellitus with mild nonproliferative diabetic retinopathy without macular edema, bilateral: Secondary | ICD-10-CM | POA: Diagnosis not present

## 2022-06-01 DIAGNOSIS — D3131 Benign neoplasm of right choroid: Secondary | ICD-10-CM | POA: Diagnosis not present

## 2022-06-06 ENCOUNTER — Encounter (HOSPITAL_BASED_OUTPATIENT_CLINIC_OR_DEPARTMENT_OTHER): Payer: Medicare HMO | Admitting: Cardiology

## 2022-06-13 ENCOUNTER — Encounter: Payer: Self-pay | Admitting: Family Medicine

## 2022-06-13 ENCOUNTER — Ambulatory Visit (INDEPENDENT_AMBULATORY_CARE_PROVIDER_SITE_OTHER): Payer: Medicare HMO | Admitting: Family Medicine

## 2022-06-13 VITALS — BP 142/68 | HR 76 | Temp 97.8°F | Ht 68.0 in | Wt 240.4 lb

## 2022-06-13 DIAGNOSIS — E782 Mixed hyperlipidemia: Secondary | ICD-10-CM

## 2022-06-13 DIAGNOSIS — R251 Tremor, unspecified: Secondary | ICD-10-CM

## 2022-06-13 DIAGNOSIS — I10 Essential (primary) hypertension: Secondary | ICD-10-CM | POA: Diagnosis not present

## 2022-06-13 NOTE — Progress Notes (Signed)
Established Patient Office Visit  Subjective   Patient ID: Karl Caples., male    DOB: February 16, 1946  Age: 76 y.o. MRN: [id number]  Chief Complaint  Patient presents with   Follow-up    6 month follow up, concerns about hand tremors.     HPI follow-up of hypertension, elevated cholesterol at 2 to 66-monthhistory of hand tremors that seem to be worse on the right.  More difficult for him to write.  Gait has not been affected.  He is right-handed blood pressure typically in the 130s over 70s.  Continues with simvastatin without issue.    Review of Systems  Constitutional: Negative.   HENT: Negative.    Eyes:  Negative for blurred vision, discharge and redness.  Respiratory: Negative.    Cardiovascular: Negative.   Gastrointestinal:  Negative for abdominal pain.  Genitourinary: Negative.   Musculoskeletal: Negative.  Negative for myalgias.  Skin:  Negative for rash.  Neurological:  Positive for tremors. Negative for dizziness, tingling, sensory change, speech change, focal weakness, loss of consciousness, weakness and headaches.  Endo/Heme/Allergies:  Negative for polydipsia.      Objective:     BP (!) 142/68 (BP Location: Left Arm, Patient Position: Sitting, Cuff Size: Large)   Pulse 76   Temp 97.8 F (36.6 C) (Temporal)   Ht '5\' 8"'$  (1.727 m)   Wt 240 lb 6.4 oz (109 kg)   SpO2 95%   BMI 36.55 kg/m  BP Readings from Last 3 Encounters:  06/13/22 (!) 142/68  05/24/22 (!) 124/43  05/03/22 128/76   Wt Readings from Last 3 Encounters:  06/13/22 240 lb 6.4 oz (109 kg)  05/24/22 242 lb (109.8 kg)  05/03/22 233 lb (105.7 kg)      Physical Exam Constitutional:      General: He is not in acute distress.    Appearance: Normal appearance. He is not ill-appearing, toxic-appearing or diaphoretic.  HENT:     Head: Normocephalic and atraumatic.     Right Ear: External ear normal.     Left Ear: External ear normal.     Mouth/Throat:     Mouth: Mucous membranes are moist.      Pharynx: Oropharynx is clear. No oropharyngeal exudate or posterior oropharyngeal erythema.  Eyes:     General: No scleral icterus.       Right eye: No discharge.        Left eye: No discharge.     Extraocular Movements: Extraocular movements intact.     Conjunctiva/sclera: Conjunctivae normal.     Pupils: Pupils are equal, round, and reactive to light.  Cardiovascular:     Rate and Rhythm: Normal rate and regular rhythm.  Pulmonary:     Effort: Pulmonary effort is normal. No respiratory distress.     Breath sounds: Normal breath sounds.  Abdominal:     General: Bowel sounds are normal.     Tenderness: There is no abdominal tenderness. There is no guarding.  Musculoskeletal:     Cervical back: No rigidity or tenderness.  Skin:    General: Skin is warm and dry.  Neurological:     Mental Status: He is alert and oriented to person, place, and time.     Cranial Nerves: No dysarthria or facial asymmetry.     Motor: Tremor present. No pronator drift.     Coordination: Romberg sign negative.     Gait: Gait normal.     Comments: Resting tremor appears to be more pronounced on  the right.  Psychiatric:        Mood and Affect: Mood normal.        Behavior: Behavior normal.      No results found for any visits on 06/13/22.    The ASCVD Risk score (Arnett DK, et al., 2019) failed to calculate for the following reasons:   The valid total cholesterol range is 130 to 320 mg/dL    Assessment & Plan:   Problem List Items Addressed This Visit       Cardiovascular and Mediastinum   Essential hypertension - Primary     Other   Mixed hyperlipidemia   Relevant Orders   LDL cholesterol, direct   Other Visit Diagnoses     Tremor of both hands       Relevant Orders   Ambulatory referral to Neurology       Return in about 6 months (around 12/14/2022).  Continue losartan and simvastatin for cholesterol.  Continue follow-up with endocrinology.  Resting tremor appears to be  worse on the right.  Have to consider Parkinson's.  Libby Maw, MD

## 2022-06-13 NOTE — Progress Notes (Unsigned)
Office Visit    Patient Name: Karl Lawson. Date of Encounter: 06/14/2022  PCP:  Libby Maw, MD   [facility]  Cardiologist:  Freada Bergeron, MD  Advanced Practice Provider:  No care team member to display Electrophysiologist:  Will Meredith Leeds, MD   HPI    Karl Lawson. is a 76 y.o. male with a past medical history significant for COPD, hypertension, hyperlipidemia, Mobitz 1 and diabetes mellitus presents today for 28-monthfollow-up.  Patient was seen by Dr. TGrandville Silosin 02/2022 for perioperative evaluation.  At that time he was borderline hypoxic with SpO2 88 to 92%.  EKG also demonstrated RBBB and LAFB and likely Mobitz 1.  Given concern for SOB and EKG findings, he was recommended for CV evaluation.  He was seen by Dr. PJohney Frame5/03/2022.  He has chronic shortness of breath.  Echocardiogram showed LV function 70 to 75%, no wall motion abnormality.  Normal RV function.  Monitor predominantly sinus rhythm, multiple pauses with longest 3.9 seconds, Wenckebach with triggered event.  Seen by Dr. CCurt Bearsand felt no need for pacemaker currently due to lack of syncope or significant symptoms.  Cardiac PET scan came back abnormal with reduced blood flow and evidence of severe coronary calcification.  He was then seen 05/03/2022 by VRobbie Lis PA for further discussion of his severe coronary calcifications.  He did not have any chest pain or shortness of breath at that time.  He was vaping and had a prior history of 40 pack year tobacco history.  He quit about 10 to 15 years ago.  He ultimately underwent cardiac catheterization 7/18 which showed mild diffuse CAD.  Nonobstructive.  Left ventricular ejection fraction of 55 to 65%.  No aortic valve stenosis.  Today, he feels pretty good.  Denies any chest pain or shortness of breath.  We went over his cardiac catheterization results, monitor results, and stress test.  He states that this all started  with a hernia repair.  His primary got an EKG which was concerning for atrial fibrillation.  Monitor was then ordered and did not show any atrial fibrillation but did prompt a sleep study.  He ended up not having OSA and not needing CPAP.  He then underwent a stress test which was abnormal so he underwent a cardiac catheterization which showed mild diffuse CAD but no obstructive disease.  He is feeling well today without any issues with his medications.  Reports no shortness of breath nor dyspnea on exertion. Reports no chest pain, pressure, or tightness. No edema, orthopnea, PND. Reports no palpitations.    Past Medical History    Past Medical History:  Diagnosis Date   [place]   Catar[place]    COPD (chronic obstructive pulmonary disease) [place]    Diabetes mellitus    Diverticulosis of colon 11/19/12   left colon, Dr GCarlean Purl  GERD (gastroesophageal reflux disease)    History of adenomatous polyps of colon - probable attenuated polyposis 07/17/2008   2004: 2 polyps max 8 mm 1 lost and 1 infammatory 2010: 7 polyps TV and tubular adenomas max 6 mm 11/19/2012 :4 polyps; max 1 cm removed; all adenomas 01/05/2017 8 polyps max 10 mm adenomas recall 2021     Hyperlipidemia    Hypertension    Personal history of adenomatous colonic polyps 07/17/2008   2004 2 polyps max 8 mm (adenomas) 2010 7 polyps TV  and tubular adenomas max 6 mm 11/19/2012     Past Surgical History:  Procedure Laterality Date   APPENDECTOMY  1959   CATARACT EXTRACTION Right    COLONOSCOPY  multiple   adenomatous polyps, Dr Carlean Purl   ESOPHAGOGASTRODUODENOSCOPY  multiple   INGUINAL HERNIA REPAIR Left 1997   LEFT HEART CATH AND CORONARY ANGIOGRAPHY N/A 05/24/2022   Procedure: LEFT HEART CATH AND CORONARY ANGIOGRAPHY;  Surgeon: Jettie Booze, MD;  Location: [facility];  Service: Cardiovascular;  Laterality: N/A;   TONSILLECTOMY      Allergies  No Known  Allergies   EKGs/Labs/Other Studies Reviewed:   The following studies were reviewed today:  Cardiac Cath 05/24/22  Left Ventricle The left ventricular size is normal. The left ventricular systolic function is normal. LV end diastolic pressure is normal. The left ventricular ejection fraction is 55-65% by visual estimate. No regional wall motion abnormalities.  Aortic Valve There is no aortic valve stenosis.   Coronary Diagrams  Diagnostic Dominance: Right  Intervention  Cardiac NM PET The study is abnormal. The study is high risk. Despite normal relative perfusion, there is low myocardial blood flow reserve (1.38).  No TID or drop in EF with stress.  Differential includes multivessel CAD, microvascular disease, or nonresponse to vasodilation.  In setting of severe coronary calcifications, recommend further evaluation with cardiac catheterization to exclude multivessel CAD.   LV perfusion is normal. There is no evidence of ischemia. There is no evidence of infarction.   Rest left ventricular function is normal. Rest EF: 69 %. Stress left ventricular function is normal. Stress EF: 71 %. End diastolic cavity size is normal. End systolic cavity size is normal.   Myocardial blood flow was computed to be 1.60m/g/min at rest and 1.668mg/min at stress. Global myocardial blood flow reserve was 1.38 and was abnormal.   Coronary calcium was present on the attenuation correction CT images. Severe coronary calcifications were present. Coronary calcifications were present in the left anterior descending artery, left circumflex artery and right coronary artery distribution(s).   Electronically signed by ChOswaldo MilianMD   Echo 03/25/22 1. Pt in Mobitz 1 second degree AV block at time of study.   2. Left ventricular ejection fraction, by estimation, is 70 to 75%. The  left ventricle has hyperdynamic function. The left ventricle has no  regional wall motion abnormalities. Left ventricular  diastolic parameters  are indeterminate.   3. Right ventricular systolic function is normal. The right ventricular  size is mildly enlarged.   4. Left atrial size was moderately dilated.   5. The mitral valve is normal in structure. No evidence of mitral valve  regurgitation. No evidence of mitral stenosis.   6. The aortic valve is tricuspid. Aortic valve regurgitation is not  visualized. Aortic valve sclerosis is present, with no evidence of aortic  valve stenosis.   7. The inferior vena cava is dilated in size with >50% respiratory  variability, suggesting right atrial pressure of 8 mmHg.   Comparison(s): No prior Echocardiogram   Monitor 03/28/22 Patch wear time was 2 days and 16 hours Predominant rhythm was sinus with first degree AVB There were 38 pauses present with longest lasting 3.9s at 2:06pm Wenkebach was present and was associated with patient triggered event Rare SVE (<1%), rare VE (<1%)     Patch Wear Time:  2 days and 16 hours (2023-05-09T22:55:57-0400 to 2023-05-12T15:08:11-398)   Patient had a min HR of 22 bpm, max HR of 112 bpm, and avg  HR of 67 bpm. Predominant underlying rhythm was Sinus Rhythm. First Degree AV Block was present. Bundle Branch Block/IVCD was present. 38 Pauses occurred, the longest lasting 3.9 secs (15 bpm).  Some Pauses occurred due to Second Degree AV Block-Mobitz I (Wenckebach). Junctional Rhythm was present. Second Degree AV Block-Mobitz I (Wenckebach) was present. Wenckebach was detected within +/- 45 seconds of symptomatic patient event(s). No Isolated  SVEs, SVE Couplets, or SVE Triplets were present. Isolated VEs were rare (<1.0%), and no VE Couplets or VE Triplets were present. Inverted QRS complexes possibly due to inverted placement of device. Difficulty discerning atrial activity making definitive  diagnosis difficult to ascertain. MD notification criteria for Pauses met - report posted prior to notification per account request  [facility]).  EKG:  EKG is not ordered today.  Recent Labs: 12/14/2021: ALT 13 05/17/2022: BUN 10; Creatinine, Ser 0.93; Hemoglobin 14.1; Platelets 240; Potassium 3.5; Sodium 142  Recent Lipid Panel    Component Value Date/Time   CHOL 114 12/14/2021 0909   TRIG 88.0 12/14/2021 0909   HDL 46.40 12/14/2021 0909   CHOLHDL 2 12/14/2021 0909   VLDL 17.6 12/14/2021 0909   LDLCALC 50 12/14/2021 0909   LDLDIRECT 47.0 06/13/2022 1137    Home Medications   Current Meds  Medication Sig   acetaminophen (TYLENOL) 500 MG tablet Take 500-1,000 mg by mouth every 6 (six) hours as needed for moderate pain.   albuterol (VENTOLIN HFA) 108 (90 Base) MCG/ACT inhaler Inhale 2 puffs into the lungs every 6 (six) hours as needed for wheezing or shortness of breath.   amLODipine (NORVASC) 10 MG tablet Take 1 tablet (10 mg total) by mouth daily.   aspirin EC 81 MG tablet Take 81 mg by mouth daily.   Blood Glucose Monitoring Suppl (ONE TOUCH ULTRA 2) w/Device KIT Check blood glucose 3 times daily as directed   carboxymethylcellulose (REFRESH PLUS) 0.5 % SOLN Place 1 drop into both eyes 2 (two) times daily as needed (dry eyes).   clotrimazole (LOTRIMIN) 1 % cream Apply 1 Application topically 2 (two) times daily as needed (yeast infections).   finasteride (PROSCAR) 5 MG tablet Take 5 mg by mouth daily.   fluticasone (FLONASE) 50 MCG/ACT nasal spray Place 1 spray into both nostrils daily as needed for allergies or rhinitis.   Insulin Aspart Prot & Aspart (NOVOLOG MIX 70/30 [place]) Inject 30 Units into the skin 2 (two) times daily.   JARDIANCE 10 MG TABS tablet Take 10 mg by mouth daily.   losartan (COZAAR) 100 MG tablet Take 100 mg by mouth daily.   metFORMIN (GLUCOPHAGE) 1000 MG tablet Take 1 tablet (1,000 mg total) by mouth 2 (two) times daily with a meal.   Multiple Vitamins-Minerals (PRESERVISION AREDS 2 PO) Take 1 capsule by mouth in the morning and at bedtime.   omeprazole (PRILOSEC) 40 MG capsule Take 1 capsule (40  mg total) by mouth daily.   ONETOUCH ULTRA test strip    simvastatin (ZOCOR) 40 MG tablet Take 1 tablet (40 mg total) by mouth at bedtime.     Review of Systems      All other systems reviewed and are otherwise negative except as noted above.  Physical Exam    VS:  BP (!) 140/64   Pulse (!) 55   Ht _0  (1.727 m)   Wt 241 lb 3.2 oz (109.4 kg)   SpO2 95%   BMI 36.67 kg/m  , BMI Body mass index is 36.67 kg/m.  Wt Readings  from Last 3 Encounters:  06/14/22 241 lb 3.2 oz (109.4 kg)  06/13/22 240 lb 6.4 oz (109 kg)  05/24/22 242 lb (109.8 kg)     GEN: Well nourished, well developed, in no acute distress. HEENT: normal. Neck: Supple, no JVD, carotid bruits, or masses. Cardiac: RRR, no murmurs, rubs, or gallops. No clubbing, cyanosis, edema.  Radials/PT 2+ and equal bilaterally.  Respiratory:  Respirations regular and unlabored, clear to auscultation bilaterally. GI: Soft, nontender, nondistended. MS: No deformity or atrophy. Skin: Warm and dry, no rash. Neuro:  Strength and sensation are intact. Psych: Normal affect.  Assessment & Plan    Nonobstructive CAD -cath  performed which showed nonobstructive CAD -no chest pain, SOB, syncope, or presyncope  Sinus pause/Bradycardia -no symptoms  -no need to PPM   HLD -LDL 50  -Will need follow-up lipid panel by PCP if not already done -Continue current cholesterol medication simvastatin 40 mg at bedtime, tolerating well.   Sleep study but no sleep apnea or CPAP needed  Hypertension -usually runs 955O systolic -he did take his medications this morning -continue current medication regimen with Norvasc 10 mg daily, Cozaar 100 mg daily        Disposition: Follow up 9 months with Freada Bergeron, MD or APP.  Signed, Elgie Collard, PA-C 06/14/2022, [place]

## 2022-06-14 ENCOUNTER — Ambulatory Visit (INDEPENDENT_AMBULATORY_CARE_PROVIDER_SITE_OTHER): Payer: Medicare HMO | Admitting: Physician Assistant

## 2022-06-14 ENCOUNTER — Encounter: Payer: Self-pay | Admitting: Physician Assistant

## 2022-06-14 VITALS — BP 140/64 | HR 55 | Ht 68.0 in | Wt 241.2 lb

## 2022-06-14 DIAGNOSIS — I4430 Unspecified atrioventricular block: Secondary | ICD-10-CM | POA: Diagnosis not present

## 2022-06-14 DIAGNOSIS — I441 Atrioventricular block, second degree: Secondary | ICD-10-CM

## 2022-06-14 DIAGNOSIS — I2510 Atherosclerotic heart disease of native coronary artery without angina pectoris: Secondary | ICD-10-CM

## 2022-06-14 DIAGNOSIS — E782 Mixed hyperlipidemia: Secondary | ICD-10-CM

## 2022-06-14 DIAGNOSIS — I2583 Coronary atherosclerosis due to lipid rich plaque: Secondary | ICD-10-CM | POA: Diagnosis not present

## 2022-06-14 LAB — LDL CHOLESTEROL, DIRECT: Direct LDL: 47 mg/dL

## 2022-06-14 NOTE — Patient Instructions (Signed)
Medication Instructions:  Your physician recommends that you continue on your current medications as directed. Please refer to the Current Medication list given to you today.  *If you need a refill on your cardiac medications before your next appointment, please call your pharmacy*   Lab Work: None If you have labs (blood work) drawn today and your tests are completely normal, you will receive your results only by: [place] (if you have MyChart) OR A paper copy in the mail If you have any lab test that is abnormal or we need to change your treatment, we will call you to review the results.  Follow-Up: At [facility], you and your health needs are our priority.  As part of our continuing mission to provide you with exceptional heart care, we have created designated Provider Care Teams.  These Care Teams include your primary Cardiologist (physician) and Advanced Practice Providers (APPs -  Physician Assistants and Nurse Practitioners) who all work together to provide you with the care you need, when you need it.   Your next appointment:   May of 2024  The format for your next appointment:   In Person  Provider:   Freada Bergeron, MD {  Important Information About Sugar

## 2022-06-16 ENCOUNTER — Encounter: Payer: Self-pay | Admitting: Neurology

## 2022-06-23 DIAGNOSIS — E1165 Type 2 diabetes mellitus with hyperglycemia: Secondary | ICD-10-CM | POA: Diagnosis not present

## 2022-07-24 DIAGNOSIS — E1165 Type 2 diabetes mellitus with hyperglycemia: Secondary | ICD-10-CM | POA: Diagnosis not present

## 2022-07-25 NOTE — Progress Notes (Unsigned)
Assessment/Plan:   1.  Parkinsonism.  I suspect that this does represent idiopathic Parkinson's disease.  The patient has tremor, bradykinesia, rigidity and mild postural instability.  -We discussed the diagnosis as well as pathophysiology of the disease.  We discussed treatment options as well as prognostic indicators.  Patient education was provided.  -Greater than 50% of the 60 minute visit was spent in counseling answering questions and talking about what to expect now as well as in the future.  We talked about medication options as well as potential future surgical options.  We talked about safety in the home.  -We decided to add carbidopa/levodopa 25/100.  1/2 tab tid x 1 wk, then 1/2 in am & noon & 1 at night for a week, then 1/2 in am &1 at noon &night for a week, then 1 po tid.  Risks, benefits, side effects and alternative therapies were discussed.  The opportunity to ask questions was given and they were answered to the best of my ability.  The patient expressed understanding and willingness to follow the outlined treatment protocols.  -I will refer the patient to therapy at [facility].  -We discussed community resources in the area including patient support groups and community exercise programs for PD and pt education was provided to the patient.  He met with my LCSW today.   2.  Alcohol use  -Discussed with patient that I wanted him to decrease this to no more than 1 drink 1 to 2 days/week.  3.  Bilateral ptosis versus pseudoptosis  -We will order acetylcholine receptor antibodies.  -Patient is already seeing both ophthalmology as well as a retina specialist for macular degeneration, and actually just saw them.  4.  Probable diabetic peripheral neuropathy  -Patient sees endocrinology outside of our system.  No labs are available, but patient reports it is well controlled now.  Subjective:   Karl Lawson. was seen today in the movement disorders clinic for neurologic  consultation at the request of Libby Maw,*.  Pt with sister in law who supplements hx.  The consultation is for the evaluation of resting tremor, right greater than left.  Medical records made available to me are reviewed.  Tremor: Yes.     How long has it been going on? few years  At rest or with activation?  Activation - notes it most with writing  Fam hx of tremor?  Yes.  sister and father had tremor, unknown dx  Located where?  Started in the R hand but now in the L as well.  He is R hand dominant  Affected by caffeine:  Yes.   (2-3 cups coffee as well as coke/tea per day)  Affected by alcohol:  No. (2 glasses wine few times per week and occasional beer)  Affected by stress:  No. 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    Spills soup if on spoon:  may or may not (spoon feeds wife and he will note it then)  Spills glass of liquid if full:  No.  Affects ADL's (tying shoes, brushing teeth, etc):  No.  Tremor inducing meds:  Yes.  ,  Albuterol (doesn't use it often)  Other Specific Symptoms:  Voice: no change Sleep: sleeps well  Vivid Dreams:  sometimes  Acting out dreams:  No. Wet Pillows: No. Postural symptoms:  No.  Falls?  No. Bradykinesia symptoms: difficulty getting out of a chair (attributes to back pain); no shuffling  Loss of smell:  Yes.   Loss of taste:  No. Urinary Incontinence:  Yes.   Difficulty Swallowing:  No. Handwriting, micrographia: No. Trouble with ADL's:  No.  Trouble buttoning clothing: No. Depression:  "its sad" and somewhat anxious Memory changes:  No. Hallucinations:  No.  visual distortions: No., does have macular degen N/V:  No. Lightheaded:  No.  Syncope: No. Diplopia:  No. (States that numbers will disappear due to macular degen) - follows with [facility]   Neuroimaging of the brain has not previously been performed.   PREVIOUS MEDICATIONS: none to date  ALLERGIES:  No Known Allergies  CURRENT MEDICATIONS:   Current Outpatient Medications  Medication Instructions   acetaminophen (TYLENOL) 500-1,000 mg, Oral, Every 6 hours PRN   albuterol (VENTOLIN HFA) 108 (90 Base) MCG/ACT inhaler 2 puffs, Inhalation, Every 6 hours PRN   amLODipine (NORVASC) 10 mg, Oral, Daily   aspirin EC 81 mg, Oral, Daily   Blood Glucose Monitoring Suppl (ONE TOUCH ULTRA 2) w/Device KIT Check blood glucose 3 times daily as directed   carbidopa-levodopa (SINEMET IR) 25-100 MG tablet 1 tablet, Oral, 3 times daily, 6am/10-11am/3-4pm   carboxymethylcellulose (REFRESH PLUS) 0.5 % SOLN 1 drop, Both Eyes, 2 times daily PRN   clotrimazole (LOTRIMIN) 1 % cream 1 Application, Topical, 2 times daily PRN   finasteride (PROSCAR) 5 mg, Oral, Daily   fluticasone (FLONASE) 50 MCG/ACT nasal spray 1 spray, Each Nare, Daily PRN   Insulin Aspart Prot & Aspart (NOVOLOG MIX 70/30 [place]) 30 Units, Subcutaneous, 2 times daily   Jardiance 10 mg, Oral, Daily   losartan (COZAAR) 100 mg, Oral, Daily   metFORMIN (GLUCOPHAGE) 1,000 mg, Oral, 2 times daily with meals   Multiple Vitamins-Minerals (PRESERVISION AREDS 2 PO) 1 capsule, Oral, 2 times daily   omeprazole (PRILOSEC) 40 mg, Oral, Daily   ONETOUCH ULTRA test strip No dose, route, or frequency recorded.   simvastatin (ZOCOR) 40 mg, Oral, Daily at bedtime    Objective:   PHYSICAL EXAMINATION:    VITALS:   Vitals:   07/27/22 0958  BP: 105/74  Pulse: (!) 59  SpO2: 96%  Weight: 235 lb 6.4 oz (106.8 kg)  Height: $Remove'5\' 8"'[id number]$  (1.727 m)    GEN:  The patient appears stated age and is in NAD. HEENT:  Normocephalic, atraumatic.  The mucous membranes are moist. The superficial temporal arteries are without ropiness or tenderness. CV:  RRR Lungs:  CTAB Neck/HEME:  There are no carotid bruits bilaterally.  Neurological examination:  Orientation: The patient is alert and oriented x3.  Cranial nerves: There is good facial symmetry.  There is bilateral ptosis vs pseudoptosis.  There is hypomimia with  lips parted at times.  Extraocular muscles are intact. The visual fields are full to confrontational testing. The speech is fluent and clear. Soft palate rises symmetrically and there is no tongue deviation. Hearing is intact to conversational tone. Sensation: Sensation is intact to light touch throughout (facial, trunk, extremities). Vibration is intact at the bilateral big toe but it is decreased distally. There is no extinction with double simultaneous stimulation.  Motor: Strength is 5/5 in the bilateral upper and lower extremities.   Shoulder shrug is equal and symmetric.  There is no pronator drift. Deep tendon reflexes: Deep tendon reflexes are 1/4 at the bilateral biceps, triceps, brachioradialis, patella and absent at the bilaterally achilles. Plantar responses are downgoing bilaterally.  Movement examination: Tone: There is mild increased tone in the RUE/RLE.   Abnormal movements: there  is RUE>LUE rest tremor, increased with distraction.   Coordination:  There is  decremation with RAM's, only with finger taps on the right.  All other rapid alternating movements are normal. Gait and Station: The patient has no significant difficulty arising out of a deep-seated chair without the use of the hands. The patient's stride length is just slightly short stepped, but he does not shuffle.  He does have decreased arm swing bilaterally.  The patient has a negative pull test.     I have reviewed and interpreted the following labs independently   Chemistry      Component Value Date/Time   NA 142 05/17/2022 1354   K 3.5 05/17/2022 1354   CL 105 05/17/2022 1354   CO2 22 05/17/2022 1354   BUN 10 05/17/2022 1354   CREATININE 0.93 05/17/2022 1354      Component Value Date/Time   CALCIUM 9.7 05/17/2022 1354   ALKPHOS 92 12/14/2021 0909   AST 15 12/14/2021 0909   ALT 13 12/14/2021 0909   BILITOT 1.0 12/14/2021 0909      Lab Results  Component Value Date   TSH 1.21 07/30/2019   Lab Results   Component Value Date   WBC 7.7 05/17/2022   HGB 14.1 05/17/2022   HCT 44.1 05/17/2022   MCV 81 05/17/2022   PLT 240 05/17/2022   Lab Results  Component Value Date   HGBA1C 9.0 (H) 05/07/2009      Total time spent on today's visit was 65 minutes, including both face-to-face time and nonface-to-face time.  Time included that spent on review of records (prior notes available to me/labs/imaging if pertinent), discussing treatment and goals, answering patient's questions and coordinating care.  Cc:  Libby Maw, MD

## 2022-07-27 ENCOUNTER — Ambulatory Visit: Payer: Medicare HMO | Admitting: Neurology

## 2022-07-27 ENCOUNTER — Other Ambulatory Visit (INDEPENDENT_AMBULATORY_CARE_PROVIDER_SITE_OTHER): Payer: Medicare HMO

## 2022-07-27 ENCOUNTER — Encounter: Payer: Self-pay | Admitting: Neurology

## 2022-07-27 VITALS — BP 105/74 | HR 59 | Ht 68.0 in | Wt 235.4 lb

## 2022-07-27 DIAGNOSIS — H02403 Unspecified ptosis of bilateral eyelids: Secondary | ICD-10-CM

## 2022-07-27 DIAGNOSIS — G20 Parkinson's disease: Secondary | ICD-10-CM

## 2022-07-27 DIAGNOSIS — G20A1 Parkinson's disease without dyskinesia, without mention of fluctuations: Secondary | ICD-10-CM | POA: Insufficient documentation

## 2022-07-27 MED ORDER — CARBIDOPA-LEVODOPA 25-100 MG PO TABS: 1.0000 | ORAL_TABLET | Freq: Three times a day (TID) | ORAL | 1 refills | Status: DC

## 2022-07-27 NOTE — Therapy (Signed)
OUTPATIENT PHYSICAL THERAPY NEURO EVALUATION   Patient Name: Karl Lawson. MRN: [id number] DOB:01-13-1946, 76 y.o., male Today's Date: 07/27/2022   PCP: Abelino Derrick REFERRING PROVIDER: Alonza Bogus    Past Medical History:  Diagnosis Date   CAP (community acquired pneumonia) [place] ([facility])   Cataract    COPD (chronic obstructive pulmonary disease) ([place])    Diabetes mellitus    Diverticulosis of colon 11/19/12   left colon, Dr Carlean Purl   GERD (gastroesophageal reflux disease)    History of adenomatous polyps of colon - probable attenuated polyposis 07/17/2008   2004: 2 polyps max 8 mm 1 lost and 1 infammatory 2010: 7 polyps TV and tubular adenomas max 6 mm 11/19/2012 :4 polyps; max 1 cm removed; all adenomas 01/05/2017 8 polyps max 10 mm adenomas recall 2021     Hyperlipidemia    Hypertension    Personal history of adenomatous colonic polyps 07/17/2008   2004 2 polyps max 8 mm (adenomas) 2010 7 polyps TV and tubular adenomas max 6 mm 11/19/2012     Past Surgical History:  Procedure Laterality Date   APPENDECTOMY  1959   CATARACT EXTRACTION Right    COLONOSCOPY  multiple   adenomatous polyps, Dr Carlean Purl   ESOPHAGOGASTRODUODENOSCOPY  multiple   INGUINAL HERNIA REPAIR Left 1997   LEFT HEART CATH AND CORONARY ANGIOGRAPHY N/A 05/24/2022   Procedure: LEFT HEART CATH AND CORONARY ANGIOGRAPHY;  Surgeon: Jettie Booze, MD;  Location: [facility];  Service: Cardiovascular;  Laterality: N/A;   TONSILLECTOMY     Patient Active Problem List   Diagnosis Date Noted   Parkinson's disease ([place]) 07/27/2022   Abnormal nuclear stress test    Hyperglycemia due to type 2 diabetes mellitus ([place]) 02/07/2022   Long term (current) use of insulin ([place]) 02/07/2022   Obesity 02/07/2022   Lower respiratory infection 02/07/2022   Reactive airway disease 02/07/2022   Unilateral inguinal hernia without obstruction or gangrene 12/13/2021   Medication side effect  01/25/2021   Balanitis 01/25/2021   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, bilateral ([place]) 07/29/2019   COPD (chronic obstructive pulmonary disease) ([place]) 12/15/2016   Skin tag 12/15/2016   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   Adjustment disorder with mixed anxiety and depressed mood 04/02/2013   ABNORMAL ELECTROCARDIOGRAM 10/22/2010   OTHER HEART BLOCK 10/14/2010   GERD 05/22/2009   Insulin dependent diabetes mellitus with complications 70/62/3762   Mixed hyperlipidemia 07/17/2008   Essential hypertension 07/17/2008   History of adenomatous polyps of colon - probable attenuated polyposis 07/17/2008   ELEVATED PROSTATE SPECIFIC ANTIGEN 07/06/2007    ONSET DATE: 07/27/22  REFERRING DIAG: G20  THERAPY DIAG:  No diagnosis found.  Rationale for Evaluation and Treatment Rehabilitation  SUBJECTIVE:  SUBJECTIVE STATEMENT: Doing alright, for the past few years I have had a tremble in my hands. I went to a neurologist yesterday and they sent me over here for an assessment. My low back has been hurting the past few weeks but once I get up and move the back pain subsides. I also have a double hernia.   PERTINENT HISTORY: COPD, DM, Left heart cath and coronary aniography 05/24/22  PAIN:  Are you having pain? Yes: NPRS scale: 5/10 Pain location: low back Pain description: ache Aggravating factors: bending over Relieving factors: moving around  PRECAUTIONS: None  WEIGHT BEARING RESTRICTIONS No  FALLS: Has patient fallen in last 6 months? No  LIVING ENVIRONMENT: Lives with: lives alone Lives in: House/apartment Stairs: Yes: Internal: 11 steps; on right going up Has following equipment at home: Single point cane, Walker - 2 wheeled,  and Wheelchair (manual)  PLOF: Independent  PATIENT GOALS to slow progression of Parkinson's disease  OBJECTIVE:   COGNITION: Overall cognitive status: Within functional limits for tasks assessed   SENSATION: WFL  COORDINATION: WNL   MUSCLE LENGTH: Hamstrings: bilateral tightness  DTRs:  From office visit 07/27/22- Deep tendon reflexes are 1/4 at the bilateral biceps, triceps, brachioradialis, patella and absent at the bilaterally achilles. Plantar responses are downgoing bilaterally.  POSTURE: rounded shoulders and forward head  LOWER EXTREMITY ROM:   WFL   LOWER EXTREMITY MMT:  grossly 4/5  STAIRS:  Level of Assistance: Modified independence  Stair Negotiation Technique: Alternating Pattern  with Single Rail on Right  Number of Stairs: 4   Height of Stairs: 4 and 6   GAIT: Gait pattern: decreased arm swing- Right, decreased arm swing- Left, decreased step length- Right, decreased step length- Left, decreased stride length, wide BOS, poor foot clearance- Right, and poor foot clearance- Left Distance walked: in clinic distances Assistive device utilized: None Level of assistance: Complete Independence  FUNCTIONAL TESTs:  5 times sit to stand: 14.38s Timed up and go (TUG): 13.97s Berg Balance Scale: 49/56 Functional gait assessment: 25/30   TODAY'S TREATMENT:  Nustep L4 x15mns   PATIENT EDUCATION: Education details: POC Person educated: Patient Education method: Explanation Education comprehension: verbalized understanding   HOME EXERCISE PROGRAM: TBD  GOALS: Goals reviewed with patient? Yes  SHORT TERM GOALS: Target date: 09/08/22  Patient will be independent with initial HEP. Goal status: INITIAL   LONG TERM GOALS: Target date: 10/20/22 Patient will be independent with advanced/ongoing HEP to improve outcomes and carryover.  Goal status: INITIAL  2.  Patient will demonstrate decreased fall risk by scoring < 12 sec on TUG. Baseline:  13.97s Goal status: INITIAL    4.  Patient will demonstrate improved functional LE strength as demonstrated by <12s on 5xSTS. Baseline: 14.38s Goal status: INITIAL  5.  Patient will demonstrate SLS of at least 10s to improve single leg balance.  Baseline: 3s at best Goal status: INITIAL   ASSESSMENT:  CLINICAL IMPRESSION:Patient is a 76y.o. male who was seen today for physical therapy evaluation and treatment for Parkinson's. He went for an office visit yesterday and they told him he might have Parkinson's, he presents with hand tremor and some bradykinesia. He is the primary caretaker for his wife who is currently at a nursing home. Overall he does well on functional testing today and has some mild balance deficits. Patient would like to work on strength, balance, and endurance to slow progression of the disease. He will benefit from skilled PT to address his impairments and  help him be more steady with gait and balance.   REHAB POTENTIAL: Good  CLINICAL DECISION MAKING: Stable/uncomplicated  EVALUATION COMPLEXITY: Low  PLAN: PT FREQUENCY: 1-2x/week  PT DURATION: 12 weeks  PLANNED INTERVENTIONS: Therapeutic exercises, Therapeutic activity, Neuromuscular re-education, Balance training, Gait training, Patient/Family education, Self Care, Joint mobilization, Stair training, Cryotherapy, Moist heat, Ionotophoresis '4mg'$ /ml Dexamethasone, and Manual therapy  PLAN FOR NEXT SESSION: start gym activities, endurance, balance    Andris Baumann, PT 07/27/2022, 3:35 PM

## 2022-07-27 NOTE — Patient Instructions (Addendum)
Start Carbidopa Levodopa as follows: Take 1/2 tablet three times daily, at least 30 minutes before meals (approximately 6am/10-11am/3-4pm), for one week Then take 1/2 tablet in the morning, 1/2 tablet in the afternoon, 1 tablet in the evening, at least 30 minutes before meals, for one week Then take 1/2 tablet in the morning, 1 tablet in the afternoon, 1 tablet in the evening, at least 30 minutes before meals, for one week Then take 1 tablet three times daily at 6am/10-11am/3-4pm, at least 30 minutes before meals   As a reminder, carbidopa/levodopa can be taken at the same time as a carbohydrate, but we like to have you take your pill either 30 minutes before a protein source or 1 hour after as protein can interfere with carbidopa/levodopa absorption.   Your provider has requested that you have labwork completed today. The lab is located on the Second floor at [place], within the [facility] Endocrinology office. When you get off the elevator, turn right and go in the [facility] Endocrinology [address]; the first brown door on the left.  Tell the ladies behind the desk that you are there for lab work. If you are not called within 15 minutes please check with the front desk.   Once you complete your labs you are free to go. You will receive a call or message via MyChart with your lab results.    Local and Online Resources for Power over Parkinson's Group September 2023  LOCAL [place] PARKINSON'S GROUPS  Power over Parkinson's Group:   Power Over Parkinson's Patient Education Group will be Wednesday, September 13th-*Hybrid meting*- in person at [facility] location and via [facility] at 2 pm.   Upcoming Power over [employer] Meetings:  2nd Wednesdays of the month at 2 pm:  September 13th, October 11th, November 8th Contact Amy Marriott at [email or web address] if interested in participating in this group Parkinson's Care Partners Group:    3rd Mondays, Contact Misty Paladino Atypical  Parkinsonian Patient Group:   4th Wednesdays, Contact Misty Paladino If you are interested in participating in these groups with Misty, please contact her directly for how to join those meetings.  Her contact information is [email or web address].    LOCAL EVENTS AND NEW OFFERINGS New PWR! Moves [employer] Instructor-Led Classes offering at [employer]!  Wednesdays 1-2 pm.   Contact Vonna Kotyk at  [email or web address] or Caron Presume at [place], [email or web address] Dance for Parkinson 's classes will be on Tuesdays 9:30am-10:30am starting October 3-December 12 with a break the week of November 21 . Located in the [employer]  which is in the first floor of the [employer] ([place] for Parkinson's will be held on 2nd and 4th Mondays at 11:00am . First class will start  September 25th.  Located at the [place] ([place] Through support from the [place] and Drumming for Parkinson's classes are free for both patients and caregivers.  Contact Misty Taylor-Paladino for more details about registering.  [place]:  www.[email or web address] PD Health at Home continues:  Mindfulness Mondays, Wellness Wednesdays, Fitness Fridays  Upcoming Education:   Navigating Nutrition with PD.  Wednesday, Sept. 6th 1:00-2:00 pm Understanding Mind and Memory.  Wednesday, Sept. 20th 1:00-2:00 pm  Expert Briefing:    Parkinson's Disease and the Bladder.  Wednesday, Sept. 13th 1:00-2:00 pm Parkinson's and the Gut-Brain Connection.  Wednesday, Oct. 11th 1:00-2:00 pm Register for expert briefings Cytogeneticist) at  [email or web address] Please check out their website to sign up for emails and see their full online offerings   [place]:   [email or web address]  Third Thursday Webinars:  On the third Thursday of every month at 12 p.m. ET, join our free live webinars to learn about various aspects of living with Parkinson's disease and our work to speed medical breakthroughs. Upcoming Webinar:  Stay tuned Check out additional information on their website to see their full online offerings  [employer]:  [email or web address] Upcoming Webinar:   Stay tuned Webinar Series:  Living with Parkinson's Meetup.   Third Thursdays each month, 3 pm Care Partner Monthly Meetup.  With Robin Searing Phinney.  First Tuesday of each month, 2 pm Check out additional information to Live Well Today on their website  Parkinson and Movement Disorders (PMD) Alliance:  www.pmdalliance.org [employer]Life Online:  Online Education Events Sign up for emails, which are sent weekly to give you updates on programming and online offerings  Parkinson's Association of the [place]:  www.[email or web address] Information on online support groups, education events, and online exercises including Yoga, Parkinson's exercises and more-LOTS of information on links to PD resources and online events Virtual Support Group through Parkinson's Association of the [place]; next one is scheduled for Wednesday, October 4th at 2 pm. (No September meeting due to the symposium.  These are typically scheduled for the 1st Wednesday of the month at 2 pm).  Visit website for details. Register for "Caring for Parkinson's-Caring for You", 9th Annual Symposium.  In-person event in [place].  September 9th.  To register:  www.[email or web address].org/symposium-registration/?blm_aid=[email or web address] MOVEMENT AND EXERCISE OPPORTUNITIES PWR! Moves Classes at [place].  Wednesdays 10 and 11 am.   Contact Amy Marriott, PT [email or web address] if interested. NEW PWR! Moves Class offerings at [employer].  Wednesdays 1-2 pm.  Contact Vonna Kotyk at   [email or web address] or Caron Presume at [place],  [email or web address] Parkinson's Wellness Recovery (PWR! Moves)  www.[email or web address] Info on the PWR! Virtual Experience:  You will have access to our expertise through self-assessment, guided plans that start with the PD-specific fundamentals, educational content, tips, Q&A with an expert, and a growing [employer] of PD-specific pre-recorded and live exercise classes of varying types and intensity - both physical and cognitive! If that is not enough, we offer 1:1 wellness consultations (in-person or virtual) to personalize your PWR! Art therapist.  [place] Fridays:  As part of the PD Health @ Home program, this free video series focuses each week on one aspect of fitness designed to support people living with Parkinson's.  These weekly videos highlight the [place] recent fitness guidelines for people with Parkinson's disease. [address] Dance for PD website is offering free, live-stream classes throughout the week, as well as links to ModemGamers.si of classes:  [email or web address]/ Virtual dance and Pilates for Parkinson's classes: Click on the Community Tab> Parkinson's Movement Initiative Tab.  To register for classes and for more information, visit www.[employer] and click the "community" tab.  [employer] Parkinson's Cycling Classes  [employer]:  Thursdays @ Noon-Live classes at 12-22-1986 ([employer] at [email or web address] or ([phone]) [employer]: Virtual Classes Mondays and Thursdays [birth year]22-1986 classes Tuesday, Wednesday and Thursday (contact [place] at [email or web address]  or [phone]) [place] Varied levels of classes are offered Tuesdays and Thursdays at [facility].  Stretching with [facility] weekly class is also offered for people with  Parkinson's To observe a class or for more information, call [phone] or email [phone] at  [email or web address] ADDITIONAL SUPPORT AND RESOURCES Well[employer]Spring [employer]:Online Caregiver Education Opportunities:  www.well-springsolutions.org/caregiver-education/caregiver-support-group.  You may also contact Vickki Muff at [email or web address] or [phone].    Coping with Difficult Caregiver Emotions.  Wednesday, September 20th, 10:30 am-12.  The [facility] Collective Navigating the Maze of Senior Care Options.  Thursday, September 28th, 4-5:15 pm.  The [facility]. Well-Spring Navigator:  [facility] program, a free service to help individuals and families through the journey of determining care for older adults.  The "Navigator" is a [employer], Education officer, museum, who will speak with a prospective client and/or loved ones to provide an assessment of the situation and a set of recommendations for a personalized care plan -- all free of charge, and whether Well-Spring [employer] offers the needed service or not. If the need is not a service we provide, we are well-connected with reputable programs in town that we can refer you to.  www.well-springsolutions.org or to speak with the Navigator, call [phone].

## 2022-07-28 ENCOUNTER — Ambulatory Visit: Payer: Medicare HMO | Attending: Neurology

## 2022-07-28 DIAGNOSIS — R29818 Other symptoms and signs involving the nervous system: Secondary | ICD-10-CM | POA: Diagnosis not present

## 2022-07-28 DIAGNOSIS — R278 Other lack of coordination: Secondary | ICD-10-CM | POA: Insufficient documentation

## 2022-07-28 DIAGNOSIS — R2689 Other abnormalities of gait and mobility: Secondary | ICD-10-CM | POA: Diagnosis not present

## 2022-07-28 DIAGNOSIS — G20 Parkinson's disease: Secondary | ICD-10-CM | POA: Insufficient documentation

## 2022-08-01 LAB — MYASTHENIA GRAVIS PANEL 1
A CHR BINDING ABS: <0.3 nmol/L
STRIATED MUSCLE AB SCREEN: NEGATIVE

## 2022-08-10 ENCOUNTER — Encounter: Payer: Self-pay | Admitting: Physical Therapy

## 2022-08-10 ENCOUNTER — Ambulatory Visit: Payer: Medicare HMO | Attending: Neurology | Admitting: Physical Therapy

## 2022-08-10 DIAGNOSIS — R278 Other lack of coordination: Secondary | ICD-10-CM | POA: Insufficient documentation

## 2022-08-10 DIAGNOSIS — G20A2 Parkinson's disease without dyskinesia, with fluctuations: Secondary | ICD-10-CM | POA: Diagnosis not present

## 2022-08-10 DIAGNOSIS — R2689 Other abnormalities of gait and mobility: Secondary | ICD-10-CM | POA: Insufficient documentation

## 2022-08-10 DIAGNOSIS — R29818 Other symptoms and signs involving the nervous system: Secondary | ICD-10-CM | POA: Diagnosis not present

## 2022-08-10 NOTE — Therapy (Signed)
OUTPATIENT PHYSICAL THERAPY NEURO EVALUATION   Patient Name: Karl Lawson. MRN: [id number] DOB:1946/07/18, 76 y.o., male Today's Date: 08/10/2022   PCP: Abelino Derrick REFERRING PROVIDER: Alonza Bogus   PT End of Session - 08/10/22 0845     Visit Number 2    Date for PT Re-Evaluation 10/20/22    PT Start Time 0845    PT Stop Time 0930    PT Time Calculation (min) 45 min    Activity Tolerance Patient tolerated treatment well    Behavior During Therapy [facility] for tasks assessed/performed             Past Medical History:  Diagnosis Date   CAP (community acquired pneumonia) [place] ([facility])   Cataract    COPD (chronic obstructive pulmonary disease) ([place])    Diabetes mellitus    Diverticulosis of colon 11/19/12   left colon, Dr Carlean Purl   GERD (gastroesophageal reflux disease)    History of adenomatous polyps of colon - probable attenuated polyposis 07/17/2008   2004: 2 polyps max 8 mm 1 lost and 1 infammatory 2010: 7 polyps TV and tubular adenomas max 6 mm 11/19/2012 :4 polyps; max 1 cm removed; all adenomas 01/05/2017 8 polyps max 10 mm adenomas recall 2021     Hyperlipidemia    Hypertension    Personal history of adenomatous colonic polyps 07/17/2008   2004 2 polyps max 8 mm (adenomas) 2010 7 polyps TV and tubular adenomas max 6 mm 11/19/2012     Past Surgical History:  Procedure Laterality Date   APPENDECTOMY  1959   CATARACT EXTRACTION Right    COLONOSCOPY  multiple   adenomatous polyps, Dr Carlean Purl   ESOPHAGOGASTRODUODENOSCOPY  multiple   INGUINAL HERNIA REPAIR Left 1997   LEFT HEART CATH AND CORONARY ANGIOGRAPHY N/A 05/24/2022   Procedure: LEFT HEART CATH AND CORONARY ANGIOGRAPHY;  Surgeon: Jettie Booze, MD;  Location: [facility];  Service: Cardiovascular;  Laterality: N/A;   TONSILLECTOMY     Patient Active Problem List   Diagnosis Date Noted   Parkinson's disease 07/27/2022   Abnormal nuclear stress test    Hyperglycemia due to  type 2 diabetes mellitus ([place]) 02/07/2022   Long term (current) use of insulin ([place]) 02/07/2022   Obesity 02/07/2022   Lower respiratory infection 02/07/2022   Reactive airway disease 02/07/2022   Unilateral inguinal hernia without obstruction or gangrene 12/13/2021   Medication side effect 01/25/2021   Balanitis 01/25/2021   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, bilateral ([place]) 07/29/2019   COPD (chronic obstructive pulmonary disease) ([place]) 12/15/2016   Skin tag 12/15/2016   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   Adjustment disorder with mixed anxiety and depressed mood 04/02/2013   ABNORMAL ELECTROCARDIOGRAM 10/22/2010   OTHER HEART BLOCK 10/14/2010   GERD 05/22/2009   Insulin dependent diabetes mellitus with complications 02/54/2706   Mixed hyperlipidemia 07/17/2008   Essential hypertension 07/17/2008   History of adenomatous polyps of colon - probable attenuated polyposis 07/17/2008   ELEVATED PROSTATE SPECIFIC ANTIGEN 07/06/2007    ONSET DATE: 07/27/22  REFERRING DIAG: G20  THERAPY DIAG:  Other lack of coordination  Other abnormalities of gait and mobility  Parkinson's disease with fluctuating manifestations, unspecified whether dyskinesia present  Rationale for Evaluation and Treatment Rehabilitation  SUBJECTIVE:  SUBJECTIVE STATEMENT: Doing ok report some pain in the buttocks from doing Yoga  PERTINENT HISTORY: COPD, DM, Left heart cath and coronary aniography 05/24/22  PAIN:  Are you having pain? Yes: NPRS scale: 2/10 Pain location: Buttocks Pain description: ache Aggravating factors: bending over Relieving factors: moving around  PRECAUTIONS: None  WEIGHT BEARING RESTRICTIONS No  FALLS: Has patient fallen  in last 6 months? No  LIVING ENVIRONMENT: Lives with: lives alone Lives in: House/apartment Stairs: Yes: Internal: 11 steps; on right going up Has following equipment at home: Single point cane, Walker - 2 wheeled, and Wheelchair (manual)  PLOF: Independent  PATIENT GOALS to slow progression of Parkinson's disease  OBJECTIVE:   COGNITION: Overall cognitive status: Within functional limits for tasks assessed   SENSATION: WFL  COORDINATION: WNL   MUSCLE LENGTH: Hamstrings: bilateral tightness  DTRs:  From office visit 07/27/22- Deep tendon reflexes are 1/4 at the bilateral biceps, triceps, brachioradialis, patella and absent at the bilaterally achilles. Plantar responses are downgoing bilaterally.  POSTURE: rounded shoulders and forward head  LOWER EXTREMITY ROM:   WFL   LOWER EXTREMITY MMT:  grossly 4/5  STAIRS:  Level of Assistance: Modified independence  Stair Negotiation Technique: Alternating Pattern  with Single Rail on Right  Number of Stairs: 4   Height of Stairs: 4 and 6   GAIT: Gait pattern: decreased arm swing- Right, decreased arm swing- Left, decreased step length- Right, decreased step length- Left, decreased stride length, wide BOS, poor foot clearance- Right, and poor foot clearance- Left Distance walked: in clinic distances Assistive device utilized: None Level of assistance: Complete Independence  FUNCTIONAL TESTs:  5 times sit to stand: 14.38s Timed up and go (TUG): 13.97s Berg Balance Scale: 49/56 Functional gait assessment: 25/30   TODAY'S TREATMENT:  08/09/22 Nustep L5 x6 mins Rows & Lats 25lb 2x10 Shoulder Ext 10lb 2x10  S2S holding Red ball 2x10  Leg Curls 25lb 2x10 Leg Ext 5lb 2x10  SUpine bridges x10  LE on pball, small bridges, K2C, Oblq     PATIENT EDUCATION: Education details: POC Person educated: Patient Education method: Explanation Education comprehension: verbalized understanding   HOME EXERCISE  PROGRAM: TBD  GOALS: Goals reviewed with patient? Yes  SHORT TERM GOALS: Target date: 09/08/22  Patient will be independent with initial HEP. Goal status: INITIAL   LONG TERM GOALS: Target date: 10/20/22 Patient will be independent with advanced/ongoing HEP to improve outcomes and carryover.  Goal status: INITIAL  2.  Patient will demonstrate decreased fall risk by scoring < 12 sec on TUG. Baseline: 13.97s Goal status: INITIAL    4.  Patient will demonstrate improved functional LE strength as demonstrated by <12s on 5xSTS. Baseline: 14.38s Goal status: INITIAL  5.  Patient will demonstrate SLS of at least 10s to improve single leg balance.  Baseline: 3s at best Goal status: INITIAL   ASSESSMENT:  CLINICAL IMPRESSION: Pt enters ding well overall. He reports some pain in the buttocks area form doing a yoga class. Pt tolerated an initial progression to TE well evident by no subjective reports fo increase pain. Some postural fatigue present with standing shoulder extensions. Hip and glute weakness noted with bridges.   REHAB POTENTIAL: Good  CLINICAL DECISION MAKING: Stable/uncomplicated  EVALUATION COMPLEXITY: Low  PLAN: PT FREQUENCY: 1-2x/week  PT DURATION: 12 weeks  PLANNED INTERVENTIONS: Therapeutic exercises, Therapeutic activity, Neuromuscular re-education, Balance training, Gait training, Patient/Family education, Self Care, Joint mobilization, Stair training, Cryotherapy, Moist heat, Ionotophoresis '4mg'$ /ml Dexamethasone, and Manual therapy  PLAN FOR NEXT SESSION: start gym activities, endurance, balance    Scot Jun, PTA 08/10/2022, 8:48 AM

## 2022-08-17 ENCOUNTER — Telehealth: Payer: Self-pay | Admitting: *Deleted

## 2022-08-17 NOTE — Telephone Encounter (Signed)
   Pre-operative Risk Assessment    Patient Name: Karl Lawson.  DOB: 12/30/1945 MRN: [id number]      Request for Surgical Clearance    Procedure:   HERNIA REPAIR  Date of Surgery:  Clearance TBD                                 Surgeon:  DR. Ralene Ok Surgeon's Group or Practice Name:  [facility] Phone number:  [phone] Fax number:  [phone] ATTN: Carlene Coria, CMA   Type of Clearance Requested:   - Medical ; ASA    Type of Anesthesia:  General    Additional requests/questions:    Jiles Prows   08/17/2022, 10:06 AM

## 2022-08-17 NOTE — Telephone Encounter (Signed)
   Name: Karl Lawson.  DOB: 10-11-1946  MRN: [id number]  Primary Cardiologist: Freada Bergeron, MD   Preoperative team, please contact this patient and set up a phone call appointment for further preoperative risk assessment. Please obtain consent and complete medication review. Thank you for your help.  I confirm that guidance regarding antiplatelet and oral anticoagulation therapy has been completed and, if necessary, noted below.    Regarding ASA therapy, we recommend continuation of ASA throughout the perioperative period.  However, if the surgeon feels that cessation of ASA is required in the perioperative period, it may be stopped 5-7 days prior to surgery with a plan to resume it as soon as felt to be feasible from a surgical standpoint in the post-operative period.  Karl Hodgkins, NP 08/17/2022, 10:27 AM Karl Lawson

## 2022-08-18 NOTE — Therapy (Signed)
OUTPATIENT PHYSICAL THERAPY NEURO TREATMENT   Patient Name: Karl Lawson. MRN: [id number] DOB:1946-04-19, 76 y.o., male Today's Date: 08/19/2022   PCP: Abelino Derrick REFERRING PROVIDER: Alonza Bogus   PT End of Session - 08/19/22 0931     Visit Number 3    Date for PT Re-Evaluation 10/20/22    PT Start Time 0930    PT Stop Time 2993    PT Time Calculation (min) 45 min    Activity Tolerance Patient tolerated treatment well    Behavior During Therapy [facility] for tasks assessed/performed              Past Medical History:  Diagnosis Date   CAP (community acquired pneumonia) [place] ([facility])   Cataract    COPD (chronic obstructive pulmonary disease) ([place])    Diabetes mellitus    Diverticulosis of colon 11/19/12   left colon, Dr Carlean Purl   GERD (gastroesophageal reflux disease)    History of adenomatous polyps of colon - probable attenuated polyposis 07/17/2008   2004: 2 polyps max 8 mm 1 lost and 1 infammatory 2010: 7 polyps TV and tubular adenomas max 6 mm 11/19/2012 :4 polyps; max 1 cm removed; all adenomas 01/05/2017 8 polyps max 10 mm adenomas recall 2021     Hyperlipidemia    Hypertension    Personal history of adenomatous colonic polyps 07/17/2008   2004 2 polyps max 8 mm (adenomas) 2010 7 polyps TV and tubular adenomas max 6 mm 11/19/2012     Past Surgical History:  Procedure Laterality Date   APPENDECTOMY  1959   CATARACT EXTRACTION Right    COLONOSCOPY  multiple   adenomatous polyps, Dr Carlean Purl   ESOPHAGOGASTRODUODENOSCOPY  multiple   INGUINAL HERNIA REPAIR Left 1997   LEFT HEART CATH AND CORONARY ANGIOGRAPHY N/A 05/24/2022   Procedure: LEFT HEART CATH AND CORONARY ANGIOGRAPHY;  Surgeon: Jettie Booze, MD;  Location: [place] [facility];  Service: Cardiovascular;  Laterality: N/A;   TONSILLECTOMY     Patient Active Problem List   Diagnosis Date Noted   Parkinson's disease 07/27/2022   Abnormal nuclear stress test    Hyperglycemia due to  type 2 diabetes mellitus ([place]) 02/07/2022   Long term (current) use of insulin ([place]) 02/07/2022   Obesity 02/07/2022   Lower respiratory infection 02/07/2022   Reactive airway disease 02/07/2022   Unilateral inguinal hernia without obstruction or gangrene 12/13/2021   Medication side effect 01/25/2021   Balanitis 01/25/2021   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, bilateral ([place]) 07/29/2019   COPD (chronic obstructive pulmonary disease) ([place]) 12/15/2016   Skin tag 12/15/2016   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   Adjustment disorder with mixed anxiety and depressed mood 04/02/2013   ABNORMAL ELECTROCARDIOGRAM 10/22/2010   OTHER HEART BLOCK 10/14/2010   GERD 05/22/2009   Insulin dependent diabetes mellitus with complications 71/69/6789   Mixed hyperlipidemia 07/17/2008   Essential hypertension 07/17/2008   History of adenomatous polyps of colon - probable attenuated polyposis 07/17/2008   ELEVATED PROSTATE SPECIFIC ANTIGEN 07/06/2007    ONSET DATE: 07/27/22  REFERRING DIAG: G20  THERAPY DIAG:  Other lack of coordination  Other abnormalities of gait and mobility  Parkinson's disease with fluctuating manifestations, unspecified whether dyskinesia present  Other symptoms and signs involving the nervous system  Rationale for Evaluation and Treatment Rehabilitation  SUBJECTIVE:  SUBJECTIVE STATEMENT: Doing fine, I am doing a yoga class 2x a week.   PERTINENT HISTORY: COPD, DM, Left heart cath and coronary aniography 05/24/22  PAIN:  Are you having pain? Yes: NPRS scale: 2/10 Pain location: Buttocks Pain description: ache Aggravating factors: bending over Relieving factors: moving around  PRECAUTIONS: None  WEIGHT BEARING  RESTRICTIONS No  FALLS: Has patient fallen in last 6 months? No  LIVING ENVIRONMENT: Lives with: lives alone Lives in: House/apartment Stairs: Yes: Internal: 11 steps; on right going up Has following equipment at home: Single point cane, Walker - 2 wheeled, and Wheelchair (manual)  PLOF: Independent  PATIENT GOALS to slow progression of Parkinson's disease  OBJECTIVE:   COGNITION: Overall cognitive status: Within functional limits for tasks assessed   SENSATION: WFL  COORDINATION: WNL   MUSCLE LENGTH: Hamstrings: bilateral tightness  DTRs:  From office visit 07/27/22- Deep tendon reflexes are 1/4 at the bilateral biceps, triceps, brachioradialis, patella and absent at the bilaterally achilles. Plantar responses are downgoing bilaterally.  POSTURE: rounded shoulders and forward head  LOWER EXTREMITY ROM:   WFL   LOWER EXTREMITY MMT:  grossly 4/5  STAIRS:  Level of Assistance: Modified independence  Stair Negotiation Technique: Alternating Pattern  with Single Rail on Right  Number of Stairs: 4   Height of Stairs: 4 and 6   GAIT: Gait pattern: decreased arm swing- Right, decreased arm swing- Left, decreased step length- Right, decreased step length- Left, decreased stride length, wide BOS, poor foot clearance- Right, and poor foot clearance- Left Distance walked: in clinic distances Assistive device utilized: None Level of assistance: Complete Independence  FUNCTIONAL TESTs:  5 times sit to stand: 14.38s Timed up and go (TUG): 13.97s Berg Balance Scale: 49/56 Functional gait assessment: 25/30   TODAY'S TREATMENT:  08/19/22 Walk outdoors  NuStep L5 x29mns  Resisted gait 40# 4 way x4  Step ups 6"  Leg ext 10# 2x10 HS curls 25# 2x10  S2S with red ball push out 2x10 on airex   08/09/22 Nustep L5 x6 mins Rows & Lats 25lb 2x10 Shoulder Ext 10lb 2x10  S2S holding Red ball 2x10  Leg Curls 25lb 2x10 Leg Ext 5lb 2x10  SUpine bridges x10  LE on pball,  small bridges, K2C, Oblq     PATIENT EDUCATION: Education details: POC Person educated: Patient Education method: Explanation Education comprehension: verbalized understanding   HOME EXERCISE PROGRAM: TBD  GOALS: Goals reviewed with patient? Yes  SHORT TERM GOALS: Target date: 09/08/22  Patient will be independent with initial HEP. Goal status: INITIAL   LONG TERM GOALS: Target date: 10/20/22 Patient will be independent with advanced/ongoing HEP to improve outcomes and carryover.  Goal status: INITIAL  2.  Patient will demonstrate decreased fall risk by scoring < 12 sec on TUG. Baseline: 13.97s Goal status: INITIAL  3.  Patient will demonstrate improved functional LE strength as demonstrated by <12s on 5xSTS. Baseline: 14.38s Goal status: INITIAL  4.  Patient will demonstrate SLS of at least 10s to improve single leg balance.  Baseline: 3s at best Goal status: INITIAL   ASSESSMENT:  CLINICAL IMPRESSION: Patient doing well overall. He was very winded after walking outdoors today, needed a long rest break to catch his breath. Requires cues to pick feet up as he tends to drag them when walking. We worked on some strengthening and balance today. Fatigue noted throughout session but good effort with all exercises.   REHAB POTENTIAL: Good  CLINICAL DECISION MAKING: Stable/uncomplicated  EVALUATION COMPLEXITY:  Low  PLAN: PT FREQUENCY: 1-2x/week  PT DURATION: 12 weeks  PLANNED INTERVENTIONS: Therapeutic exercises, Therapeutic activity, Neuromuscular re-education, Balance training, Gait training, Patient/Family education, Self Care, Joint mobilization, Stair training, Cryotherapy, Moist heat, Ionotophoresis '4mg'$ /ml Dexamethasone, and Manual therapy  PLAN FOR NEXT SESSION: side steps on airex, more balance activities, functional strengthening    Andris Baumann, PT 08/19/2022, 10:15 AM

## 2022-08-19 ENCOUNTER — Ambulatory Visit: Payer: Medicare HMO

## 2022-08-19 DIAGNOSIS — R29818 Other symptoms and signs involving the nervous system: Secondary | ICD-10-CM

## 2022-08-19 DIAGNOSIS — R278 Other lack of coordination: Secondary | ICD-10-CM | POA: Diagnosis not present

## 2022-08-19 DIAGNOSIS — G20A2 Parkinson's disease without dyskinesia, with fluctuations: Secondary | ICD-10-CM | POA: Diagnosis not present

## 2022-08-19 DIAGNOSIS — R2689 Other abnormalities of gait and mobility: Secondary | ICD-10-CM

## 2022-08-22 ENCOUNTER — Telehealth: Payer: Self-pay

## 2022-08-22 ENCOUNTER — Ambulatory Visit: Payer: Medicare HMO | Admitting: Physical Therapy

## 2022-08-22 NOTE — Telephone Encounter (Signed)
Contacted the patient and scheduled virtual appointment. Received virtual consent as well as reviewed allergies and med list. Patient agreeable and voiced understanding.

## 2022-08-22 NOTE — Telephone Encounter (Signed)
  Patient Consent for Virtual Visit         Karl Lawson. has provided verbal consent on 08/22/2022 for a virtual visit (video or telephone).   CONSENT FOR VIRTUAL VISIT FOR:  Karl Lawson.  By participating in this virtual visit I agree to the following:  I hereby voluntarily request, consent and authorize [facility] and its employed or contracted physicians, physician assistants, nurse practitioners or other licensed health care professionals (the Practitioner), to provide me with telemedicine health care services (the "Services") as deemed necessary by the treating Practitioner. I acknowledge and consent to receive the Services by the Practitioner via telemedicine. I understand that the telemedicine visit will involve communicating with the Practitioner through live audiovisual communication technology and the disclosure of certain medical information by electronic transmission. I acknowledge that I have been given the opportunity to request an in-person assessment or other available alternative prior to the telemedicine visit and am voluntarily participating in the telemedicine visit.  I understand that I have the right to withhold or withdraw my consent to the use of telemedicine in the course of my care at any time, without affecting my right to future care or treatment, and that the Practitioner or I may terminate the telemedicine visit at any time. I understand that I have the right to inspect all information obtained and/or recorded in the course of the telemedicine visit and may receive copies of available information for a reasonable fee.  I understand that some of the potential risks of receiving the Services via telemedicine include:  Delay or interruption in medical evaluation due to technological equipment failure or disruption; Information transmitted may not be sufficient (e.g. poor resolution of images) to allow for appropriate medical decision making by the  Practitioner; and/or  In rare instances, security protocols could fail, causing a breach of personal health information.  Furthermore, I acknowledge that it is my responsibility to provide information about my medical history, conditions and care that is complete and accurate to the best of my ability. I acknowledge that Practitioner's advice, recommendations, and/or decision may be based on factors not within their control, such as incomplete or inaccurate data provided by me or distortions of diagnostic images or specimens that may result from electronic transmissions. I understand that the practice of medicine is not an exact science and that Practitioner makes no warranties or guarantees regarding treatment outcomes. I acknowledge that a copy of this consent can be made available to me via my patient portal ([facility]), or I can request a printed copy by calling the office of [facility].    I understand that my insurance will be billed for this visit.   I have read or had this consent read to me. I understand the contents of this consent, which adequately explains the benefits and risks of the Services being provided via telemedicine.  I have been provided ample opportunity to ask questions regarding this consent and the Services and have had my questions answered to my satisfaction. I give my informed consent for the services to be provided through the use of telemedicine in my medical care

## 2022-08-23 ENCOUNTER — Telehealth: Payer: Self-pay

## 2022-08-23 DIAGNOSIS — E1165 Type 2 diabetes mellitus with hyperglycemia: Secondary | ICD-10-CM | POA: Diagnosis not present

## 2022-08-23 NOTE — Patient Outreach (Signed)
  Care Coordination   Initial Visit Note   08/23/2022 Name: Roald Lukacs. MRN: [id number] DOB: 1946-10-20  Mallie Darting. is a 76 y.o. year old male who sees Libby Maw, MD for primary care. I spoke with  Mallie Darting. by phone today.  What matters to the patients health and wellness today?  Parkinson's Management    Goals Addressed             This Visit's Progress    Diabetes and Parkinson's Management       Care Coordination Interventions: Evaluation of current treatment plan related to Parkinson's and patient's adherence to plan as established by provider Discussed plans with patient for ongoing care management follow up and provided patient with direct contact information for care management team Provided education to patient about basic DM disease process Reviewed medications with patient and discussed importance of medication adherence Managing diabetes with sugars mostly running around 100 or less.   Patient on medications for parkinson's tremors.  [facility].  He reports some tremor at times.  Currently doing therapy for strength and balance        SDOH assessments and interventions completed:  Yes     Care Coordination Interventions Activated:  Yes  Care Coordination Interventions:  Yes, provided   Follow up plan: Follow up call scheduled for December    Encounter Outcome:  Pt. Visit Completed   Jone Baseman, RN, MSN [facility] Care Management Coordinator Direct Line [phone]

## 2022-08-23 NOTE — Patient Instructions (Signed)
Visit Information  Thank you for taking time to visit with me today. Please don't hesitate to contact me if I can be of assistance to you.   Following are the goals we discussed today:   Goals Addressed             This Visit's Progress    Diabetes and Parkinson's Management       Care Coordination Interventions: Evaluation of current treatment plan related to Parkinson's and patient's adherence to plan as established by provider Discussed plans with patient for ongoing care management follow up and provided patient with direct contact information for care management team Provided education to patient about basic DM disease process Reviewed medications with patient and discussed importance of medication adherence Managing diabetes with sugars mostly running around 100 or less.   Patient on medications for parkinson's tremors.  [facility].  He reports some tremor at times.  Currently doing therapy for strength and balance        Our next appointment is by telephone on October 19, 2022 at 100 am  Please call the care guide team at [phone] if you need to cancel or reschedule your appointment.   If you are experiencing a Mental Health or [place] or need someone to talk to, please call the Suicide and Crisis Lifeline: 988   The patient verbalized understanding of instructions, educational materials, and care plan provided today and agreed to receive a mailed copy of patient instructions, educational materials, and care plan.   No further follow up required: December  Karl Lawson J Melody Savidge, RN, MSN [facility] Care Management Coordinator Direct Line [phone]

## 2022-08-24 ENCOUNTER — Ambulatory Visit: Payer: Medicare HMO | Admitting: Physical Therapy

## 2022-08-24 ENCOUNTER — Encounter: Payer: Self-pay | Admitting: Physical Therapy

## 2022-08-24 DIAGNOSIS — R2689 Other abnormalities of gait and mobility: Secondary | ICD-10-CM | POA: Diagnosis not present

## 2022-08-24 DIAGNOSIS — R29818 Other symptoms and signs involving the nervous system: Secondary | ICD-10-CM | POA: Diagnosis not present

## 2022-08-24 DIAGNOSIS — R278 Other lack of coordination: Secondary | ICD-10-CM | POA: Diagnosis not present

## 2022-08-24 DIAGNOSIS — G20A2 Parkinson's disease without dyskinesia, with fluctuations: Secondary | ICD-10-CM | POA: Diagnosis not present

## 2022-08-24 NOTE — Therapy (Signed)
OUTPATIENT PHYSICAL THERAPY NEURO TREATMENT   Patient Name: Karl Lawson. MRN: [id number] DOB:Aug 22, 1946, 76 y.o., male Today's Date: 08/24/2022   PCP: Abelino Derrick REFERRING PROVIDER: Alonza Bogus   PT End of Session - 08/24/22 0842     Visit Number 4    Date for PT Re-Evaluation 10/20/22    PT Start Time 0845    PT Stop Time 0930    PT Time Calculation (min) 45 min    Activity Tolerance Patient tolerated treatment well    Behavior During Therapy [facility] for tasks assessed/performed              Past Medical History:  Diagnosis Date   CAP (community acquired pneumonia) 1968   [place] ([facility])   Cataract    COPD (chronic obstructive pulmonary disease) ([place])    Diabetes mellitus    Diverticulosis of colon 11/19/12   left colon, Dr Carlean Purl   GERD (gastroesophageal reflux disease)    History of adenomatous polyps of colon - probable attenuated polyposis 07/17/2008   2004: 2 polyps max 8 mm 1 lost and 1 infammatory 2010: 7 polyps TV and tubular adenomas max 6 mm 11/19/2012 :4 polyps; max 1 cm removed; all adenomas 01/05/2017 8 polyps max 10 mm adenomas recall 2021     Hyperlipidemia    Hypertension    Personal history of adenomatous colonic polyps 07/17/2008   2004 2 polyps max 8 mm (adenomas) 2010 7 polyps TV and tubular adenomas max 6 mm 11/19/2012     Past Surgical History:  Procedure Laterality Date   APPENDECTOMY  1959   CATARACT EXTRACTION Right    COLONOSCOPY  multiple   adenomatous polyps, Dr Carlean Purl   ESOPHAGOGASTRODUODENOSCOPY  multiple   INGUINAL HERNIA REPAIR Left 1997   LEFT HEART CATH AND CORONARY ANGIOGRAPHY N/A 05/24/2022   Procedure: LEFT HEART CATH AND CORONARY ANGIOGRAPHY;  Surgeon: Jettie Booze, MD;  Location: [facility];  Service: Cardiovascular;  Laterality: N/A;   TONSILLECTOMY     Patient Active Problem List   Diagnosis Date Noted   Parkinson's disease 07/27/2022   Abnormal nuclear stress test    Hyperglycemia due to  type 2 diabetes mellitus ([place]) 02/07/2022   Long term (current) use of insulin ([place]) 02/07/2022   Obesity 02/07/2022   Lower respiratory infection 02/07/2022   Reactive airway disease 02/07/2022   Unilateral inguinal hernia without obstruction or gangrene 12/13/2021   Medication side effect 01/25/2021   Balanitis 01/25/2021   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, bilateral ([place]) 07/29/2019   COPD (chronic obstructive pulmonary disease) ([place]) 12/15/2016   Skin tag 12/15/2016   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   Adjustment disorder with mixed anxiety and depressed mood 04/02/2013   ABNORMAL ELECTROCARDIOGRAM 10/22/2010   OTHER HEART BLOCK 10/14/2010   GERD 05/22/2009   Insulin dependent diabetes mellitus with complications 51/70/0174   Mixed hyperlipidemia 07/17/2008   Essential hypertension 07/17/2008   History of adenomatous polyps of colon - probable attenuated polyposis 07/17/2008   ELEVATED PROSTATE SPECIFIC ANTIGEN 07/06/2007    ONSET DATE: 07/27/22  REFERRING DIAG: G20  THERAPY DIAG:  Other lack of coordination  Other abnormalities of gait and mobility  Parkinson's disease with fluctuating manifestations, unspecified whether dyskinesia present  Rationale for Evaluation and Treatment Rehabilitation  SUBJECTIVE:  SUBJECTIVE STATEMENT: Feeling ok  PERTINENT HISTORY: COPD, DM, Left heart cath and coronary aniography 05/24/22  PAIN:  Are you having pain? Yes: NPRS scale: 0/10 Pain location: Buttocks Pain description: ache Aggravating factors: bending over Relieving factors: moving around  PRECAUTIONS: None  WEIGHT BEARING RESTRICTIONS No  FALLS: Has patient fallen in last 6 months? No  LIVING  ENVIRONMENT: Lives with: lives alone Lives in: House/apartment Stairs: Yes: Internal: 11 steps; on right going up Has following equipment at home: Single point cane, Walker - 2 wheeled, and Wheelchair (manual)  PLOF: Independent  PATIENT GOALS to slow progression of Parkinson's disease  OBJECTIVE:    MUSCLE LENGTH: Hamstrings: bilateral tightness  DTRs:  From office visit 07/27/22- Deep tendon reflexes are 1/4 at the bilateral biceps, triceps, brachioradialis, patella and absent at the bilaterally achilles. Plantar responses are downgoing bilaterally.  POSTURE: rounded shoulders and forward head  LOWER EXTREMITY ROM:   WFL   LOWER EXTREMITY MMT:  grossly 4/5  STAIRS:  Level of Assistance: Modified independence  Stair Negotiation Technique: Alternating Pattern  with Single Rail on Right  Number of Stairs: 4   Height of Stairs: 4 and 6   GAIT: Gait pattern: decreased arm swing- Right, decreased arm swing- Left, decreased step length- Right, decreased step length- Left, decreased stride length, wide BOS, poor foot clearance- Right, and poor foot clearance- Left Distance walked: in clinic distances Assistive device utilized: None Level of assistance: Complete Independence  FUNCTIONAL TESTs:  5 times sit to stand: 14.38s Timed up and go (TUG): 13.97s Berg Balance Scale: 49/56 Functional gait assessment: 25/30   TODAY'S TREATMENT:  08/24/22 NuStep L5 x 6 min  HS curls 25# 2x12 Leg Ext 10lb 2x12 Step ups 6" x10 each Resisted gait 40lb 4 way x4 each  S2S on airex w/ OHP yellow ball 2x10 Rows & Lats 35lb 2x10  08/19/22 Walk outdoors  NuStep L5 x2mns  Resisted gait 40# 4 way x4  Step ups 6"  Leg ext 10# 2x10 HS curls 25# 2x10  S2S with red ball push out 2x10 on airex   08/09/22 Nustep L5 x6 mins Rows & Lats 25lb 2x10 Shoulder Ext 10lb 2x10  S2S holding Red ball 2x10  Leg Curls 25lb 2x10 Leg Ext 5lb 2x10  SUpine bridges x10  LE on pball, small bridges,  K2C, Oblq     PATIENT EDUCATION: Education details: POC Person educated: Patient Education method: Explanation Education comprehension: verbalized understanding   HOME EXERCISE PROGRAM: TBD  GOALS: Goals reviewed with patient? Yes  SHORT TERM GOALS: Target date: 09/08/22  Patient will be independent with initial HEP. Goal status: INITIAL   LONG TERM GOALS: Target date: 10/20/22 Patient will be independent with advanced/ongoing HEP to improve outcomes and carryover.  Goal status: INITIAL  2.  Patient will demonstrate decreased fall risk by scoring < 12 sec on TUG. Baseline: 13.97s Goal status: 8.88 Met 08/24/22  3.  Patient will demonstrate improved functional LE strength as demonstrated by <12s on 5xSTS. Baseline: 14.38s Goal status: 11.99 Met 08/24/22  4.  Patient will demonstrate SLS of at least 10s to improve single leg balance.  Baseline: 3s at best Goal status: INITIAL   ASSESSMENT:  CLINICAL IMPRESSION: Patient doing well overall. He has progressed decreasing his 5x sit to stand time as well as TUG time. We worked on some strengthening and balance today. Fatigue noted throughout session but good effort with all exercises.   REHAB POTENTIAL: Good  CLINICAL DECISION MAKING: Stable/uncomplicated  EVALUATION COMPLEXITY: Low  PLAN: PT FREQUENCY: 1-2x/week  PT DURATION: 12 weeks  PLANNED INTERVENTIONS: Therapeutic exercises, Therapeutic activity, Neuromuscular re-education, Balance training, Gait training, Patient/Family education, Self Care, Joint mobilization, Stair training, Cryotherapy, Moist heat, Ionotophoresis 38m/ml Dexamethasone, and Manual therapy  PLAN FOR NEXT SESSION: side steps on airex, more balance activities, functional strengthening    RScot Jun PTA 08/24/2022, 8:43 AM

## 2022-08-26 ENCOUNTER — Ambulatory Visit: Payer: Medicare HMO | Attending: Cardiovascular Disease | Admitting: General Practice

## 2022-08-26 DIAGNOSIS — Z01810 Encounter for preprocedural cardiovascular examination: Secondary | ICD-10-CM

## 2022-08-26 NOTE — Progress Notes (Signed)
Virtual Visit via Telephone Note   Because of Karl ROLPH Jr.'s co-morbid illnesses, he is at least at moderate risk for complications without adequate follow up.  This format is felt to be most appropriate for this patient at this time.  The patient did not have access to video technology/had technical difficulties with video requiring transitioning to audio format only (telephone).  All issues noted in this document were discussed and addressed.  No physical exam could be performed with this format.  Please refer to the patient's chart for his consent to telehealth for [facility].  Evaluation Performed:  Preoperative cardiovascular risk assessment _____________   Date:  08/26/2022   Patient ID:  Karl Lawson., DOB 14-Nov-1945, MRN [id number] Patient Location:  Home Provider location:   Office  Primary Care Provider:  Libby Maw, MD Primary Cardiologist:  Freada Bergeron, MD  Chief Complaint / Patient Profile   76 y.o. y/o male with a h/o essential hypertension, HLD, chronic bronchitis, and Mobitz type I second-degree AV block who is pending hernia repair and presents today for telephonic preoperative cardiovascular risk assessment.  Past Medical History    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)   Cataract    COPD (chronic obstructive pulmonary disease) ([place])    Diabetes mellitus    Diverticulosis of colon 11/19/12   left colon, Dr Carlean Purl   GERD (gastroesophageal reflux disease)    History of adenomatous polyps of colon - probable attenuated polyposis 07/17/2008   2004: 2 polyps max 8 mm 1 lost and 1 infammatory 2010: 7 polyps TV and tubular adenomas max 6 mm 11/19/2012 :4 polyps; max 1 cm removed; all adenomas 01/05/2017 8 polyps max 10 mm adenomas recall 2021     Hyperlipidemia    Hypertension    Personal history of adenomatous colonic polyps 07/17/2008   2004 2 polyps max 8 mm  (adenomas) 2010 7 polyps TV and tubular adenomas max 6 mm 11/19/2012     Past Surgical History:  Procedure Laterality Date   APPENDECTOMY  1959   CATARACT EXTRACTION Right    COLONOSCOPY  multiple   adenomatous polyps, Dr Carlean Purl   ESOPHAGOGASTRODUODENOSCOPY  multiple   INGUINAL HERNIA REPAIR Left 1997   LEFT HEART CATH AND CORONARY ANGIOGRAPHY N/A 05/24/2022   Procedure: LEFT HEART CATH AND CORONARY ANGIOGRAPHY;  Surgeon: Jettie Booze, MD;  Location: [place] [facility];  Service: Cardiovascular;  Laterality: N/A;   TONSILLECTOMY      Allergies  No Known Allergies  History of Present Illness    Karl Lawson. is a 76 y.o. male who presents via audio/video conferencing for a telehealth visit today.  Pt was last seen in cardiology clinic on 05/03/2022 by [facility] PA-C.  At that time Karl Lawson. was doing well, his cardiac PET CTwas discussed, and it was decided that he would proceed to cardiac catheterization.  His cardiac catheterization showed mild diffuse nonobstructive coronary disease.  The patient is now pending procedure as outlined above. Since his last visit, he remains stable from a cardiac standpoint.  Today he denies chest pain, shortness of breath, lower extremity edema, fatigue, palpitations, melena, hematuria, hemoptysis, diaphoresis, weakness, presyncope, syncope, orthopnea, and PND.    Home Medications    Prior to Admission medications   Medication Sig Start Date End Date Taking? Authorizing Provider  acetaminophen (TYLENOL) 500 MG tablet Take  500-1,000 mg by mouth every 6 (six) hours as needed for moderate pain.    [provider]  albuterol (VENTOLIN HFA) 108 (90 Base) MCG/ACT inhaler Inhale 2 puffs into the lungs every 6 (six) hours as needed for wheezing or shortness of breath.    [provider]  amLODipine (NORVASC) 10 MG tablet Take 1 tablet (10 mg total) by mouth daily. 03/11/22   Freada Bergeron, MD  aspirin EC 81 MG  tablet Take 81 mg by mouth daily.    [provider]  Blood Glucose Monitoring Suppl (ONE TOUCH ULTRA 2) w/Device KIT Check blood glucose 3 times daily as directed    [provider]  carbidopa-levodopa (SINEMET IR) 25-100 MG tablet Take 1 tablet by mouth 3 (three) times daily. 6am/10-11am/3-4pm 07/27/22   Tat, Eustace Quail, DO  carboxymethylcellulose (REFRESH PLUS) 0.5 % SOLN Place 1 drop into both eyes 2 (two) times daily as needed (dry eyes).    [provider]  clotrimazole (LOTRIMIN) 1 % cream Apply 1 Application topically 2 (two) times daily as needed (yeast infections).    [provider]  finasteride (PROSCAR) 5 MG tablet Take 5 mg by mouth daily.    [provider]  fluticasone (FLONASE) 50 MCG/ACT nasal spray Place 1 spray into both nostrils daily as needed for allergies or rhinitis.    [provider]  Insulin Aspart Prot & Aspart (NOVOLOG MIX 70/30 [place]) Inject 30 Units into the skin 2 (two) times daily.    [provider]  JARDIANCE 10 MG TABS tablet Take 10 mg by mouth daily. 04/03/20   [provider]  losartan (COZAAR) 100 MG tablet Take 100 mg by mouth daily.    [provider]  metFORMIN (GLUCOPHAGE) 1000 MG tablet Take 1 tablet (1,000 mg total) by mouth 2 (two) times daily with a meal. 05/26/22   Jettie Booze, MD  Multiple Vitamins-Minerals (PRESERVISION AREDS 2 PO) Take 1 capsule by mouth in the morning and at bedtime.    [provider]  omeprazole (PRILOSEC) 40 MG capsule Take 1 capsule (40 mg total) by mouth daily. 05/06/20   Noralyn Pick, NP  ONETOUCH ULTRA test strip  08/11/20   [provider]  simvastatin (ZOCOR) 40 MG tablet Take 1 tablet (40 mg total) by mouth at bedtime. 07/30/19   Lucille Passy, MD    Physical Exam    Vital Signs:  Karl Lawson. does not have vital signs available for review today.  Given telephonic nature of communication, physical exam  is limited. AAOx3. NAD. Normal affect.  Speech and respirations are unlabored.  Accessory Clinical Findings    None  Assessment & Plan    1.  Preoperative Cardiovascular Risk Assessment: Hernia repair; Dr. Rosendo Gros, [facility]      Primary Cardiologist: Freada Bergeron, MD  Chart reviewed as part of pre-operative protocol coverage. Given past medical history and time since last visit, based on ACC/AHA guidelines, Phenix Vandermeulen. would be at acceptable risk for the planned procedure without further cardiovascular testing.   Patient was advised that if he develops new symptoms prior to surgery to contact our office to arrange a follow-up appointment.  He verbalized understanding.  His RCRI is a class II risk, 0.9% risk of major cardiac event.  He is able to complete greater than 4 METS of physical activity.  Regarding ASA therapy, we recommend continuation of ASA throughout the perioperative period.  However, if  the surgeon feels that cessation of ASA is required in the perioperative period, it may be stopped 5-7 days prior to surgery with a plan to resume it as soon as felt to be feasible from a surgical standpoint in the post-operative period.   I will route this recommendation to the requesting party via Epic fax function and remove from pre-op pool.  Time:   Today, I have spent 5 minutes with the patient with telehealth technology discussing medical history, symptoms, and management plan.  Prior to his phone evaluation has been greater than 10 minutes reviewing his past medical history and cardiac medications.   Deberah Pelton, NP  08/26/2022, 7:35 AM

## 2022-08-31 ENCOUNTER — Encounter: Payer: Self-pay | Admitting: Physical Therapy

## 2022-08-31 ENCOUNTER — Ambulatory Visit: Payer: Medicare HMO | Admitting: Physical Therapy

## 2022-08-31 DIAGNOSIS — R29818 Other symptoms and signs involving the nervous system: Secondary | ICD-10-CM

## 2022-08-31 DIAGNOSIS — R2689 Other abnormalities of gait and mobility: Secondary | ICD-10-CM

## 2022-08-31 DIAGNOSIS — R278 Other lack of coordination: Secondary | ICD-10-CM

## 2022-08-31 DIAGNOSIS — G20A2 Parkinson's disease without dyskinesia, with fluctuations: Secondary | ICD-10-CM | POA: Diagnosis not present

## 2022-08-31 NOTE — Therapy (Signed)
OUTPATIENT PHYSICAL THERAPY NEURO TREATMENT   Patient Name: Karl Lawson. MRN: [id number] DOB:02-16-1946, 76 y.o., male Today's Date: 08/31/2022   PCP: Abelino Derrick REFERRING PROVIDER: Alonza Bogus   PT End of Session - 08/31/22 0840     Visit Number 5    Date for PT Re-Evaluation 10/20/22    PT Start Time 0840    PT Stop Time 0930    PT Time Calculation (min) 50 min    Activity Tolerance Patient tolerated treatment well    Behavior During Therapy [facility] for tasks assessed/performed              Past Medical History:  Diagnosis Date   CAP (community acquired pneumonia) 1968   [place] ([facility])   Cataract    COPD (chronic obstructive pulmonary disease) ([place])    Diabetes mellitus    Diverticulosis of colon 11/19/12   left colon, Dr Carlean Purl   GERD (gastroesophageal reflux disease)    History of adenomatous polyps of colon - probable attenuated polyposis 07/17/2008   2004: 2 polyps max 8 mm 1 lost and 1 infammatory 2010: 7 polyps TV and tubular adenomas max 6 mm 11/19/2012 :4 polyps; max 1 cm removed; all adenomas 01/05/2017 8 polyps max 10 mm adenomas recall 2021     Hyperlipidemia    Hypertension    Personal history of adenomatous colonic polyps 07/17/2008   2004 2 polyps max 8 mm (adenomas) 2010 7 polyps TV and tubular adenomas max 6 mm 11/19/2012     Past Surgical History:  Procedure Laterality Date   APPENDECTOMY  1959   CATARACT EXTRACTION Right    COLONOSCOPY  multiple   adenomatous polyps, Dr Carlean Purl   ESOPHAGOGASTRODUODENOSCOPY  multiple   INGUINAL HERNIA REPAIR Left 1997   LEFT HEART CATH AND CORONARY ANGIOGRAPHY N/A 05/24/2022   Procedure: LEFT HEART CATH AND CORONARY ANGIOGRAPHY;  Surgeon: Jettie Booze, MD;  Location: [place] [facility];  Service: Cardiovascular;  Laterality: N/A;   TONSILLECTOMY     Patient Active Problem List   Diagnosis Date Noted   Parkinson's disease 07/27/2022   Abnormal nuclear stress test    Hyperglycemia due to  type 2 diabetes mellitus ([place]) 02/07/2022   Long term (current) use of insulin ([place]) 02/07/2022   Obesity 02/07/2022   Lower respiratory infection 02/07/2022   Reactive airway disease 02/07/2022   Unilateral inguinal hernia without obstruction or gangrene 12/13/2021   Medication side effect 01/25/2021   Balanitis 01/25/2021   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, bilateral ([place]) 07/29/2019   COPD (chronic obstructive pulmonary disease) ([place]) 12/15/2016   Skin tag 12/15/2016   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   Adjustment disorder with mixed anxiety and depressed mood 04/02/2013   ABNORMAL ELECTROCARDIOGRAM 10/22/2010   OTHER HEART BLOCK 10/14/2010   GERD 05/22/2009   Insulin dependent diabetes mellitus with complications 12/81/1886   Mixed hyperlipidemia 07/17/2008   Essential hypertension 07/17/2008   History of adenomatous polyps of colon - probable attenuated polyposis 07/17/2008   ELEVATED PROSTATE SPECIFIC ANTIGEN 07/06/2007    ONSET DATE: 07/27/22  REFERRING DIAG: G20  THERAPY DIAG:  Other lack of coordination  Other abnormalities of gait and mobility  Parkinson's disease with fluctuating manifestations, unspecified whether dyskinesia present  Other symptoms and signs involving the nervous system  Rationale for Evaluation and Treatment Rehabilitation  SUBJECTIVE:  SUBJECTIVE STATEMENT: "ok" Pt reports not sleeping well last night, hernia was poking out.   PERTINENT HISTORY: COPD, DM, Left heart cath and coronary aniography 05/24/22  PAIN:  Are you having pain? Yes: NPRS scale: 0/10 Pain location: Buttocks Pain description: ache Aggravating factors: bending over Relieving factors: moving around  PRECAUTIONS:  None  WEIGHT BEARING RESTRICTIONS No  FALLS: Has patient fallen in last 6 months? No  LIVING ENVIRONMENT: Lives with: lives alone Lives in: House/apartment Stairs: Yes: Internal: 11 steps; on right going up Has following equipment at home: Single point cane, Walker - 2 wheeled, and Wheelchair (manual)  PLOF: Independent  PATIENT GOALS to slow progression of Parkinson's disease  OBJECTIVE:    MUSCLE LENGTH: Hamstrings: bilateral tightness  DTRs:  From office visit 07/27/22- Deep tendon reflexes are 1/4 at the bilateral biceps, triceps, brachioradialis, patella and absent at the bilaterally achilles. Plantar responses are downgoing bilaterally.  POSTURE: rounded shoulders and forward head  LOWER EXTREMITY ROM:   WFL   LOWER EXTREMITY MMT:  grossly 4/5  STAIRS:  Level of Assistance: Modified independence  Stair Negotiation Technique: Alternating Pattern  with Single Rail on Right  Number of Stairs: 4   Height of Stairs: 4 and 6   GAIT: Gait pattern: decreased arm swing- Right, decreased arm swing- Left, decreased step length- Right, decreased step length- Left, decreased stride length, wide BOS, poor foot clearance- Right, and poor foot clearance- Left Distance walked: in clinic distances Assistive device utilized: None Level of assistance: Complete Independence  FUNCTIONAL TESTs:  5 times sit to stand: 14.38s Timed up and go (TUG): 13.97s Berg Balance Scale: 49/56 Functional gait assessment: 25/30   TODAY'S TREATMENT:  08/31/22 NuStep L5 x 7 min  Hamstring curls 35lb 2x12 Leg Ext 15lb 2x10 SLS 3x5'' each, Pt able to hold 10 seconds during the last rep. S67m tou touch needed with previous two reps 30lb resisted side step over WaTE bar  S2S on airex 2x10 Tandem and side step on balance beam in // barx  Rows & Lats 35lb 2x12   08/24/22 NuStep L5 x 6 min  HS curls 25# 2x12 Leg Ext 10lb 2x12 Step ups 6" x10 each Resisted gait 40lb 4 way x4 each  S2S on  airex w/ OHP yellow ball 2x10 Rows & Lats 35lb 2x10  08/19/22 Walk outdoors  NuStep L5 x526ms  Resisted gait 40# 4 way x4  Step ups 6"  Leg ext 10# 2x10 HS curls 25# 2x10  S2S with red ball push out 2x10 on airex     PATIENT EDUCATION: Education details: POC Person educated: Patient Education method: Explanation Education comprehension: verbalized understanding   HOME EXERCISE PROGRAM: Doing yoga twice a week  GOALS: Goals reviewed with patient? Yes  SHORT TERM GOALS: Target date: 09/08/22  Patient will be independent with initial HEP. Goal status: INITIAL   LONG TERM GOALS: Target date: 10/20/22 Patient will be independent with advanced/ongoing HEP to improve outcomes and carryover.  Goal status: INITIAL  2.  Patient will demonstrate decreased fall risk by scoring < 12 sec on TUG. Baseline: 13.97s Goal status: 8.88 Met 08/24/22  3.  Patient will demonstrate improved functional LE strength as demonstrated by <12s on 5xSTS. Baseline: 14.38s Goal status: 11.99 Met 08/24/22  4.  Patient will demonstrate SLS of at least 10s to improve single leg balance.  Baseline: 3s at best Goal status: Partly met 08/31/22   ASSESSMENT:  CLINICAL IMPRESSION: Patient continues to do well overall. He has  progressed increasing his SLS time. Worked on some strengthening and balance today. Fatigue noted throughout session but good effort with all exercises. UE use required with tandem walking on balance beam.  REHAB POTENTIAL: Good  CLINICAL DECISION MAKING: Stable/uncomplicated  EVALUATION COMPLEXITY: Low  PLAN: PT FREQUENCY: 1-2x/week  PT DURATION: 12 weeks  PLANNED INTERVENTIONS: Therapeutic exercises, Therapeutic activity, Neuromuscular re-education, Balance training, Gait training, Patient/Family education, Self Care, Joint mobilization, Stair training, Cryotherapy, Moist heat, Ionotophoresis 53m/ml Dexamethasone, and Manual therapy  PLAN FOR NEXT SESSION: side steps  on airex, more balance activities, functional strengthening    RScot Jun PTA 08/31/2022, 8:41 AM
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
## 2022-09-07 ENCOUNTER — Ambulatory Visit: Payer: Medicare HMO | Attending: Neurology

## 2022-09-07 ENCOUNTER — Ambulatory Visit: Payer: Self-pay | Admitting: General Surgery

## 2022-09-07 DIAGNOSIS — R29818 Other symptoms and signs involving the nervous system: Secondary | ICD-10-CM | POA: Insufficient documentation

## 2022-09-07 DIAGNOSIS — K4020 Bilateral inguinal hernia, without obstruction or gangrene, not specified as recurrent: Secondary | ICD-10-CM | POA: Diagnosis not present

## 2022-09-07 DIAGNOSIS — R278 Other lack of coordination: Secondary | ICD-10-CM | POA: Diagnosis not present

## 2022-09-07 DIAGNOSIS — R2689 Other abnormalities of gait and mobility: Secondary | ICD-10-CM | POA: Insufficient documentation

## 2022-09-07 DIAGNOSIS — G20A2 Parkinson's disease without dyskinesia, with fluctuations: Secondary | ICD-10-CM | POA: Diagnosis not present

## 2022-09-07 NOTE — H&P (Signed)
Chief Complaint: Follow-up       History of Present Illness: Karl Lawson. is a 76 y.o. male who is seen today as an office consultation at the request of Dr. Ethelene Hal for evaluation of Follow-up .   Patient is a 76 year old male who follows back up today secondary to bilateral hernias.  He states having more pain to the right inguinal hernia than the left.  He does state that the left one is large.   Patient has recently been evaluated by cardiology and states they would recommend perioperative continuation of aspirin.   I had a long discussion with the patient regards to this.  I did discuss with there is a higher chance of bleeding with surgery.  At this point I would recommend him stopping his aspirin 3 days prior to surgery and we can resume the day after.         Review of Systems: A complete review of systems was obtained from the patient.  I have reviewed this information and discussed as appropriate with the patient.  See HPI as well for other ROS.   Review of Systems  Constitutional:  Negative for fever.  HENT:  Negative for congestion.   Eyes:  Negative for blurred vision.  Respiratory:  Negative for cough, shortness of breath and wheezing.   Cardiovascular:  Negative for chest pain and palpitations.  Gastrointestinal:  Negative for heartburn.  Genitourinary:  Negative for dysuria.  Musculoskeletal:  Negative for myalgias.  Skin:  Negative for rash.  Neurological:  Negative for dizziness and headaches.  Psychiatric/Behavioral:  Negative for depression and suicidal ideas.   All other systems reviewed and are negative.       Medical History: Past Medical History Past Medical History: Diagnosis Date  Diabetes mellitus without complication (CMS-HCC)        There is no problem list on file for this patient.     Past Surgical History Past Surgical History: Procedure Laterality Date  HERNIA REPAIR          Allergies No Known Allergies    Current  Outpatient Medications on File Prior to Visit Medication Sig Dispense Refill  carbidopa-levodopa (SINEMET) 25-100 mg tablet Take by mouth      amLODIPine (NORVASC) 5 MG tablet Take 1 tablet by mouth once daily      aspirin 81 MG chewable tablet 1 tablet      empagliflozin (JARDIANCE) 10 mg tablet 1 tablet      finasteride (PROSCAR) 5 mg tablet 1 tablet      insulin LISPRO PROTAMINE-LISPRO (HUMALOG 75-25) 100 unit/mL (75-25) injection Inject subcutaneously      losartan-hydrochlorothiazide (HYZAAR) 100-25 mg tablet Take 1 tablet by mouth once daily      metFORMIN (GLUCOPHAGE) 1000 MG tablet        multivitamin with minerals tablet Take 1 tablet by mouth once daily      omeprazole (PRILOSEC) 40 MG DR capsule 1 capsule      simvastatin (ZOCOR) 40 MG tablet 1 tablet in the evening       No current facility-administered medications on file prior to visit.     Family History Family History Problem Relation Age of Onset  High blood pressure (Hypertension) Father    Coronary Artery Disease (Blocked arteries around heart) Father    Diabetes Brother        Social History   Tobacco Use Smoking Status Every Day  Types: Cigarettes Smokeless Tobacco Never     Social  History Social History    Socioeconomic History  Marital status: Married Tobacco Use  Smoking status: Every Day     Types: Cigarettes  Smokeless tobacco: Never Vaping Use  Vaping Use: Never used Substance and Sexual Activity  Sexual activity: Yes      Objective:     There were no vitals filed for this visit.  There is no height or weight on file to calculate BMI. Physical Exam Constitutional:      Appearance: Normal appearance.  HENT:     Head: Normocephalic and atraumatic.     Nose: Nose normal. No congestion.     Mouth/Throat:     Mouth: Mucous membranes are moist.     Pharynx: Oropharynx is clear.  Eyes:     Pupils: Pupils are equal, round, and reactive to light.  Cardiovascular:     Rate and  Rhythm: Normal rate and regular rhythm.     Pulses: Normal pulses.     Heart sounds: Normal heart sounds. No murmur heard.    No friction rub. No gallop.  Pulmonary:     Effort: Pulmonary effort is normal. No respiratory distress.     Breath sounds: Normal breath sounds. No stridor. No wheezing, rhonchi or rales.  Abdominal:     General: Abdomen is flat.     Hernia: A hernia is present. Hernia is present in the left inguinal area and right inguinal area.  Musculoskeletal:        General: Normal range of motion.     Cervical back: Normal range of motion.  Skin:    General: Skin is warm and dry.  Neurological:     General: No focal deficit present.     Mental Status: He is alert and oriented to person, place, and time.  Psychiatric:        Mood and Affect: Mood normal.        Thought Content: Thought content normal.          Assessment and Plan: Diagnoses and all orders for this visit:   Bilateral inguinal hernia without obstruction or gangrene, recurrence not specified     Karl Lawson. is a 76 y.o. male    1.  We will proceed to the OR for a laparoscopic bilateral inguinal hernia repair with mesh. 2. All risks and benefits were discussed with the patient, to generally include infection, bleeding, damage to surrounding structures, acute and chronic nerve pain, and recurrence. Alternatives were offered and described.  All questions were answered and the patient voiced understanding of the procedure and wishes to proceed at this point.             No follow-ups on file.   Ralene Ok, MD, [facility] Surgery, [place] [facility]

## 2022-09-14 ENCOUNTER — Ambulatory Visit: Payer: Medicare HMO | Admitting: Physical Therapy

## 2022-09-14 ENCOUNTER — Encounter: Payer: Self-pay | Admitting: Physical Therapy

## 2022-09-14 DIAGNOSIS — R2689 Other abnormalities of gait and mobility: Secondary | ICD-10-CM

## 2022-09-14 DIAGNOSIS — R29818 Other symptoms and signs involving the nervous system: Secondary | ICD-10-CM | POA: Diagnosis not present

## 2022-09-14 DIAGNOSIS — R278 Other lack of coordination: Secondary | ICD-10-CM | POA: Diagnosis not present

## 2022-09-14 DIAGNOSIS — G20A2 Parkinson's disease without dyskinesia, with fluctuations: Secondary | ICD-10-CM

## 2022-09-14 NOTE — Therapy (Signed)
OUTPATIENT PHYSICAL THERAPY NEURO TREATMENT   Patient Name: Karl Lawson. MRN: [id number] DOB:11-22-1945, 76 y.o., male Today's Date: 09/14/2022   PCP: Abelino Derrick REFERRING PROVIDER: Alonza Bogus   PT End of Session - 09/14/22 0845     Visit Number 7    Date for PT Re-Evaluation 10/20/22    Authorization Type [employer]    PT Start Time 0845    PT Stop Time 0930    PT Time Calculation (min) 45 min    Activity Tolerance Patient tolerated treatment well    Behavior During Therapy [facility] for tasks assessed/performed               Past Medical History:  Diagnosis Date   CAP (community acquired pneumonia) 1968   [place] ([facility])   Cataract    COPD (chronic obstructive pulmonary disease) ([place])    Diabetes mellitus    Diverticulosis of colon 11/19/12   left colon, Dr Carlean Purl   GERD (gastroesophageal reflux disease)    History of adenomatous polyps of colon - probable attenuated polyposis 07/17/2008   2004: 2 polyps max 8 mm 1 lost and 1 infammatory 2010: 7 polyps TV and tubular adenomas max 6 mm 11/19/2012 :4 polyps; max 1 cm removed; all adenomas 01/05/2017 8 polyps max 10 mm adenomas recall 2021     Hyperlipidemia    Hypertension    Personal history of adenomatous colonic polyps 07/17/2008   2004 2 polyps max 8 mm (adenomas) 2010 7 polyps TV and tubular adenomas max 6 mm 11/19/2012     Past Surgical History:  Procedure Laterality Date   APPENDECTOMY  1959   CATARACT EXTRACTION Right    COLONOSCOPY  multiple   adenomatous polyps, Dr Carlean Purl   ESOPHAGOGASTRODUODENOSCOPY  multiple   INGUINAL HERNIA REPAIR Left 1997   LEFT HEART CATH AND CORONARY ANGIOGRAPHY N/A 05/24/2022   Procedure: LEFT HEART CATH AND CORONARY ANGIOGRAPHY;  Surgeon: Jettie Booze, MD;  Location: [place] [facility];  Service: Cardiovascular;  Laterality: N/A;   TONSILLECTOMY     Patient Active Problem List   Diagnosis Date Noted   Parkinson's disease 07/27/2022   Abnormal nuclear  stress test    Hyperglycemia due to type 2 diabetes mellitus ([place]) 02/07/2022   Long term (current) use of insulin ([place]) 02/07/2022   Obesity 02/07/2022   Lower respiratory infection 02/07/2022   Reactive airway disease 02/07/2022   Unilateral inguinal hernia without obstruction or gangrene 12/13/2021   Medication side effect 01/25/2021   Balanitis 01/25/2021   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, bilateral ([place]) 07/29/2019   COPD (chronic obstructive pulmonary disease) ([place]) 12/15/2016   Skin tag 12/15/2016   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   Adjustment disorder with mixed anxiety and depressed mood 04/02/2013   ABNORMAL ELECTROCARDIOGRAM 10/22/2010   OTHER HEART BLOCK 10/14/2010   GERD 05/22/2009   Insulin dependent diabetes mellitus with complications 83/35/8251   Mixed hyperlipidemia 07/17/2008   Essential hypertension 07/17/2008   History of adenomatous polyps of colon - probable attenuated polyposis 07/17/2008   ELEVATED PROSTATE SPECIFIC ANTIGEN 07/06/2007    ONSET DATE: 07/27/22  REFERRING DIAG: G20  THERAPY DIAG:  Parkinson's disease with fluctuating manifestations, unspecified whether dyskinesia present  Other lack of coordination  Other abnormalities of gait and mobility  Rationale for Evaluation and Treatment Rehabilitation  SUBJECTIVE:  SUBJECTIVE STATEMENT: Having some back pain today. Pain started last night from working in the yard all day yesterday  PERTINENT HISTORY: COPD, DM, Left heart cath and coronary aniography 05/24/22  PAIN:  Are you having pain? Yes: NPRS scale: 5/10 Pain location: low back Pain description: ache Aggravating factors: bending over Relieving factors: moving  around  PRECAUTIONS: None  WEIGHT BEARING RESTRICTIONS No  FALLS: Has patient fallen in last 6 months? No  LIVING ENVIRONMENT: Lives with: lives alone Lives in: House/apartment Stairs: Yes: Internal: 11 steps; on right going up Has following equipment at home: Single point cane, Walker - 2 wheeled, and Wheelchair (manual)  PLOF: Independent  PATIENT GOALS to slow progression of Parkinson's disease  OBJECTIVE:    MUSCLE LENGTH: Hamstrings: bilateral tightness  DTRs:  From office visit 07/27/22- Deep tendon reflexes are 1/4 at the bilateral biceps, triceps, brachioradialis, patella and absent at the bilaterally achilles. Plantar responses are downgoing bilaterally.  POSTURE: rounded shoulders and forward head  LOWER EXTREMITY ROM:   WFL   LOWER EXTREMITY MMT:  grossly 4/5  STAIRS:  Level of Assistance: Modified independence  Stair Negotiation Technique: Alternating Pattern  with Single Rail on Right  Number of Stairs: 4   Height of Stairs: 4 and 6   GAIT: Gait pattern: decreased arm swing- Right, decreased arm swing- Left, decreased step length- Right, decreased step length- Left, decreased stride length, wide BOS, poor foot clearance- Right, and poor foot clearance- Left Distance walked: in clinic distances Assistive device utilized: None Level of assistance: Complete Independence  FUNCTIONAL TESTs:  5 times sit to stand: 14.38s Timed up and go (TUG): 13.97s Berg Balance Scale: 49/56 Functional gait assessment: 25/30   TODAY'S TREATMENT:  09/14/22 NuStep L5 x6 min  Seated Row & Lats  35lb 2x10 Back Ext black 2x10 S2S on airex with yellow ball chest press 2x10 Shoulder Ext 10lb 2x10 Standing rows 15lb 2x10 Hamstring curls 25lb 2x15 Leg Ext 15lb 2x12 Supine LE stretches with MHP on lumbar spine   09/07/22 NuStep L5x5mns Leg ext 15# 3x10 HS curls 35# 3x10 Leg press 40# 3x10 Shoulder ext 10# 2x10 Cable rows 10# 2x10 S2S with blue ball push out  2x10 Step up 6" from airex  Lateral step ups 6" onto airex with HHA  Calf raises on bar 2x12  08/31/22 NuStep L5 x 7 min  Hamstring curls 35lb 2x12 Leg Ext 15lb 2x10 SLS 3x5'' each, Pt able to hold 10 seconds during the last rep. [id number] touch needed with previous two reps 30lb resisted side step over WaTE bar  S2S on airex 2x10 Tandem and side step on balance beam in // barx  Rows & Lats 35lb 2x12   08/24/22 NuStep L5 x 6 min  HS curls 25# 2x12 Leg Ext 10lb 2x12 Step ups 6" x10 each Resisted gait 40lb 4 way x4 each  S2S on airex w/ OHP yellow ball 2x10 Rows & Lats 35lb 2x10  08/19/22 Walk outdoors  NuStep L5 x5m53m  Resisted gait 40# 4 way x4  Step ups 6"  Leg ext 10# 2x10 HS curls 25# 2x10  S2S with red ball push out 2x10 on airex     PATIENT EDUCATION: Education details: POC Person educated: Patient Education method: Explanation Education comprehension: verbalized understanding   HOME EXERCISE PROGRAM: Doing yoga twice a week  GOALS: Goals reviewed with patient? Yes  SHORT TERM GOALS: Target date: 09/08/22  Patient will be independent with initial HEP. Goal status: Met  LONG TERM GOALS: Target date: 10/20/22 Patient will be independent with advanced/ongoing HEP to improve outcomes and carryover.  Goal status: INITIAL  2.  Patient will demonstrate decreased fall risk by scoring < 12 sec on TUG. Baseline: 13.97s Goal status: 8.88 Met 08/24/22  3.  Patient will demonstrate improved functional LE strength as demonstrated by <12s on 5xSTS. Baseline: 14.38s Goal status: 11.99 Met 08/24/22  4.  Patient will demonstrate SLS of at least 10s to improve single leg balance.  Baseline: 3s at best Goal status: Partly met 08/31/22   ASSESSMENT:  CLINICAL IMPRESSION: Patient enters with reports of LBP from working in his yard yesterday.  Worked on some strengthening and stretching today. Some unsteadiness on airex. Fatigue noted throughout session but  good effort with all exercises. Pt does have to inguinal hernias that started to hurt some with hip ROM.  REHAB POTENTIAL: Good  CLINICAL DECISION MAKING: Stable/uncomplicated  EVALUATION COMPLEXITY: Low  PLAN: PT FREQUENCY: 1-2x/week  PT DURATION: 12 weeks  PLANNED INTERVENTIONS: Therapeutic exercises, Therapeutic activity, Neuromuscular re-education, Balance training, Gait training, Patient/Family education, Self Care, Joint mobilization, Stair training, Cryotherapy, Moist heat, Ionotophoresis 19m/ml Dexamethasone, and Manual therapy  PLAN FOR NEXT SESSION: side steps on airex, more balance activities, functional strengthening    RScot Jun PTA 09/14/2022, 8:45 AM

## 2022-09-21 ENCOUNTER — Encounter: Payer: Self-pay | Admitting: Physical Therapy

## 2022-09-21 ENCOUNTER — Ambulatory Visit: Payer: Medicare HMO | Admitting: Physical Therapy

## 2022-09-21 DIAGNOSIS — R2689 Other abnormalities of gait and mobility: Secondary | ICD-10-CM | POA: Diagnosis not present

## 2022-09-21 DIAGNOSIS — G20A2 Parkinson's disease without dyskinesia, with fluctuations: Secondary | ICD-10-CM

## 2022-09-21 DIAGNOSIS — R278 Other lack of coordination: Secondary | ICD-10-CM | POA: Diagnosis not present

## 2022-09-21 DIAGNOSIS — R29818 Other symptoms and signs involving the nervous system: Secondary | ICD-10-CM | POA: Diagnosis not present

## 2022-09-21 NOTE — Therapy (Signed)
OUTPATIENT PHYSICAL THERAPY NEURO TREATMENT   Patient Name: Karl Lawson. MRN: [id number] DOB:09-26-1946, 76 y.o., male Today's Date: 09/21/2022   PCP: Abelino Derrick REFERRING PROVIDER: Alonza Bogus   PT End of Session - 09/21/22 0837     Visit Number 8    Date for PT Re-Evaluation 10/20/22    PT Start Time 0840    PT Stop Time 0925    PT Time Calculation (min) 45 min    Activity Tolerance Patient tolerated treatment well    Behavior During Therapy [facility] for tasks assessed/performed               Past Medical History:  Diagnosis Date   CAP (community acquired pneumonia) 1968   [place] ([facility])   Cataract    COPD (chronic obstructive pulmonary disease) ([place])    Diabetes mellitus    Diverticulosis of colon 11/19/12   left colon, Dr Carlean Purl   GERD (gastroesophageal reflux disease)    History of adenomatous polyps of colon - probable attenuated polyposis 07/17/2008   2004: 2 polyps max 8 mm 1 lost and 1 infammatory 2010: 7 polyps TV and tubular adenomas max 6 mm 11/19/2012 :4 polyps; max 1 cm removed; all adenomas 01/05/2017 8 polyps max 10 mm adenomas recall 2021     Hyperlipidemia    Hypertension    Personal history of adenomatous colonic polyps 07/17/2008   2004 2 polyps max 8 mm (adenomas) 2010 7 polyps TV and tubular adenomas max 6 mm 11/19/2012     Past Surgical History:  Procedure Laterality Date   APPENDECTOMY  1959   CATARACT EXTRACTION Right    COLONOSCOPY  multiple   adenomatous polyps, Dr Carlean Purl   ESOPHAGOGASTRODUODENOSCOPY  multiple   INGUINAL HERNIA REPAIR Left 1997   LEFT HEART CATH AND CORONARY ANGIOGRAPHY N/A 05/24/2022   Procedure: LEFT HEART CATH AND CORONARY ANGIOGRAPHY;  Surgeon: Jettie Booze, MD;  Location: [place] [facility];  Service: Cardiovascular;  Laterality: N/A;   TONSILLECTOMY     Patient Active Problem List   Diagnosis Date Noted   Parkinson's disease 07/27/2022   Abnormal nuclear stress test    Hyperglycemia due to  type 2 diabetes mellitus ([place]) 02/07/2022   Long term (current) use of insulin ([place]) 02/07/2022   Obesity 02/07/2022   Lower respiratory infection 02/07/2022   Reactive airway disease 02/07/2022   Unilateral inguinal hernia without obstruction or gangrene 12/13/2021   Medication side effect 01/25/2021   Balanitis 01/25/2021   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, bilateral ([place]) 07/29/2019   COPD (chronic obstructive pulmonary disease) ([place]) 12/15/2016   Skin tag 12/15/2016   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   Adjustment disorder with mixed anxiety and depressed mood 04/02/2013   ABNORMAL ELECTROCARDIOGRAM 10/22/2010   OTHER HEART BLOCK 10/14/2010   GERD 05/22/2009   Insulin dependent diabetes mellitus with complications 08/67/6195   Mixed hyperlipidemia 07/17/2008   Essential hypertension 07/17/2008   History of adenomatous polyps of colon - probable attenuated polyposis 07/17/2008   ELEVATED PROSTATE SPECIFIC ANTIGEN 07/06/2007    ONSET DATE: 07/27/22  REFERRING DIAG: G20  THERAPY DIAG:  Parkinson's disease with fluctuating manifestations, unspecified whether dyskinesia present  Other abnormalities of gait and mobility  Rationale for Evaluation and Treatment Rehabilitation  SUBJECTIVE:  SUBJECTIVE STATEMENT: "Alright"  PERTINENT HISTORY: COPD, DM, Left heart cath and coronary aniography 05/24/22  PAIN:  Are you having pain? Yes: NPRS scale: 0/10 Pain location: low back Pain description: ache Aggravating factors: bending over Relieving factors: moving around  PRECAUTIONS: None  WEIGHT BEARING RESTRICTIONS No  FALLS: Has patient fallen in last 6 months? No  LIVING ENVIRONMENT: Lives with: lives alone Lives in:  House/apartment Stairs: Yes: Internal: 11 steps; on right going up Has following equipment at home: Single point cane, Walker - 2 wheeled, and Wheelchair (manual)  PLOF: Independent  PATIENT GOALS to slow progression of Parkinson's disease  OBJECTIVE:    MUSCLE LENGTH: Hamstrings: bilateral tightness  DTRs:  From office visit 07/27/22- Deep tendon reflexes are 1/4 at the bilateral biceps, triceps, brachioradialis, patella and absent at the bilaterally achilles. Plantar responses are downgoing bilaterally.  POSTURE: rounded shoulders and forward head  LOWER EXTREMITY ROM:   WFL   LOWER EXTREMITY MMT:  grossly 4/5  STAIRS:  Level of Assistance: Modified independence  Stair Negotiation Technique: Alternating Pattern  with Single Rail on Right  Number of Stairs: 4   Height of Stairs: 4 and 6   GAIT: Gait pattern: decreased arm swing- Right, decreased arm swing- Left, decreased step length- Right, decreased step length- Left, decreased stride length, wide BOS, poor foot clearance- Right, and poor foot clearance- Left Distance walked: in clinic distances Assistive device utilized: None Level of assistance: Complete Independence  FUNCTIONAL TESTs:  5 times sit to stand: 14.38s Timed up and go (TUG): 13.97s Berg Balance Scale: 49/56 Functional gait assessment: 25/30   TODAY'S TREATMENT:  09/21/22 NuStep L5 x 6 min  Hamstring curls 25lb 2x15 Leg Ext 15lb 2x12 Seated rows & Lats 35lb 2x10 Chest press 15lb 2x10  S2S OHP 7lb dumbbell 2x10 Side step on and off airex  Side step and tandem walking on balance bean in // bars  09/14/22 NuStep L5 x6 min  Seated Row & Lats  35lb 2x10 Back Ext black 2x10 S2S on airex with yellow ball chest press 2x10 Shoulder Ext 10lb 2x10 Standing rows 15lb 2x10 Hamstring curls 25lb 2x15 Leg Ext 15lb 2x12 Supine LE stretches with MHP on lumbar spine   09/07/22 NuStep L5x28mns Leg ext 15# 3x10 HS curls 35# 3x10 Leg press 40#  3x10 Shoulder ext 10# 2x10 Cable rows 10# 2x10 S2S with blue ball push out 2x10 Step up 6" from airex  Lateral step ups 6" onto airex with HHA  Calf raises on bar 2x12  08/31/22 NuStep L5 x 7 min  Hamstring curls 35lb 2x12 Leg Ext 15lb 2x10 SLS 3x5'' each, Pt able to hold 10 seconds during the last rep. [id number] touch needed with previous two reps 30lb resisted side step over WaTE bar  S2S on airex 2x10 Tandem and side step on balance beam in // barx  Rows & Lats 35lb 2x12   08/24/22 NuStep L5 x 6 min  HS curls 25# 2x12 Leg Ext 10lb 2x12 Step ups 6" x10 each Resisted gait 40lb 4 way x4 each  S2S on airex w/ OHP yellow ball 2x10 Rows & Lats 35lb 2x10  08/19/22 Walk outdoors  NuStep L5 x5m3m  Resisted gait 40# 4 way x4  Step ups 6"  Leg ext 10# 2x10 HS curls 25# 2x10  S2S with red ball push out 2x10 on airex     PATIENT EDUCATION: Education details: POC Person educated: Patient Education method: Explanation Education comprehension: verbalized understanding   HOME  EXERCISE PROGRAM: Doing yoga twice a week  GOALS: Goals reviewed with patient? Yes  SHORT TERM GOALS: Target date: 09/08/22  Patient will be independent with initial HEP. Goal status: Met   LONG TERM GOALS: Target date: 10/20/22 Patient will be independent with advanced/ongoing HEP to improve outcomes and carryover.  Goal status: INITIAL  2.  Patient will demonstrate decreased fall risk by scoring < 12 sec on TUG. Baseline: 13.97s Goal status: 8.88 Met 08/24/22  3.  Patient will demonstrate improved functional LE strength as demonstrated by <12s on 5xSTS. Baseline: 14.38s Goal status: 11.99 Met 08/24/22  4.  Patient will demonstrate SLS of at least 10s to improve single leg balance.  Baseline: 3s at best Goal status: Partly met 08/31/22   ASSESSMENT:  CLINICAL IMPRESSION: Patient enters doing well. Worked on some strengthening and stretching today. Increase resistance tolerated  with HS curls. Some unsteadiness on airex. Fatigue noted throughout session but good effort with all exercises. UE assist needed with tandem walking  REHAB POTENTIAL: Good  CLINICAL DECISION MAKING: Stable/uncomplicated  EVALUATION COMPLEXITY: Low  PLAN: PT FREQUENCY: 1-2x/week  PT DURATION: 12 weeks  PLANNED INTERVENTIONS: Therapeutic exercises, Therapeutic activity, Neuromuscular re-education, Balance training, Gait training, Patient/Family education, Self Care, Joint mobilization, Stair training, Cryotherapy, Moist heat, Ionotophoresis 64m/ml Dexamethasone, and Manual therapy  PLAN FOR NEXT SESSION: side steps on airex, more balance activities, functional strengthening    RScot Jun PTA 09/21/2022, 8:38 AM

## 2022-09-22 DIAGNOSIS — I10 Essential (primary) hypertension: Secondary | ICD-10-CM | POA: Diagnosis not present

## 2022-09-22 DIAGNOSIS — E7800 Pure hypercholesterolemia, unspecified: Secondary | ICD-10-CM | POA: Diagnosis not present

## 2022-09-22 DIAGNOSIS — E669 Obesity, unspecified: Secondary | ICD-10-CM | POA: Diagnosis not present

## 2022-09-22 DIAGNOSIS — Z794 Long term (current) use of insulin: Secondary | ICD-10-CM | POA: Diagnosis not present

## 2022-09-22 DIAGNOSIS — E10319 Type 1 diabetes mellitus with unspecified diabetic retinopathy without macular edema: Secondary | ICD-10-CM | POA: Diagnosis not present

## 2022-09-22 DIAGNOSIS — E1165 Type 2 diabetes mellitus with hyperglycemia: Secondary | ICD-10-CM | POA: Diagnosis not present

## 2022-09-23 DIAGNOSIS — E1165 Type 2 diabetes mellitus with hyperglycemia: Secondary | ICD-10-CM | POA: Diagnosis not present

## 2022-09-28 ENCOUNTER — Encounter: Payer: Self-pay | Admitting: Physical Therapy

## 2022-09-28 ENCOUNTER — Ambulatory Visit: Payer: Medicare HMO | Admitting: Physical Therapy

## 2022-09-28 DIAGNOSIS — R29818 Other symptoms and signs involving the nervous system: Secondary | ICD-10-CM | POA: Diagnosis not present

## 2022-09-28 DIAGNOSIS — R278 Other lack of coordination: Secondary | ICD-10-CM

## 2022-09-28 DIAGNOSIS — G20A2 Parkinson's disease without dyskinesia, with fluctuations: Secondary | ICD-10-CM

## 2022-09-28 DIAGNOSIS — R2689 Other abnormalities of gait and mobility: Secondary | ICD-10-CM

## 2022-09-28 NOTE — Therapy (Signed)
OUTPATIENT PHYSICAL THERAPY NEURO TREATMENT   Patient Name: Karl Lawson. MRN: [id number] DOB:1946-06-15, 76 y.o., male Today's Date: 09/28/2022   PCP: Abelino Derrick REFERRING PROVIDER: Alonza Bogus   PT End of Session - 09/28/22 0842     Visit Number 9    Date for PT Re-Evaluation 10/20/22    PT Start Time 0840    PT Stop Time 0925    PT Time Calculation (min) 45 min    Activity Tolerance Patient tolerated treatment well    Behavior During Therapy [facility] for tasks assessed/performed               Past Medical History:  Diagnosis Date   CAP (community acquired pneumonia) 1968   [place] ([facility])   Cataract    COPD (chronic obstructive pulmonary disease) ([place])    Diabetes mellitus    Diverticulosis of colon 11/19/12   left colon, Dr Carlean Purl   GERD (gastroesophageal reflux disease)    History of adenomatous polyps of colon - probable attenuated polyposis 07/17/2008   2004: 2 polyps max 8 mm 1 lost and 1 infammatory 2010: 7 polyps TV and tubular adenomas max 6 mm 11/19/2012 :4 polyps; max 1 cm removed; all adenomas 01/05/2017 8 polyps max 10 mm adenomas recall 2021     Hyperlipidemia    Hypertension    Personal history of adenomatous colonic polyps 07/17/2008   2004 2 polyps max 8 mm (adenomas) 2010 7 polyps TV and tubular adenomas max 6 mm 11/19/2012     Past Surgical History:  Procedure Laterality Date   APPENDECTOMY  1959   CATARACT EXTRACTION Right    COLONOSCOPY  multiple   adenomatous polyps, Dr Carlean Purl   ESOPHAGOGASTRODUODENOSCOPY  multiple   INGUINAL HERNIA REPAIR Left 1997   LEFT HEART CATH AND CORONARY ANGIOGRAPHY N/A 05/24/2022   Procedure: LEFT HEART CATH AND CORONARY ANGIOGRAPHY;  Surgeon: Jettie Booze, MD;  Location: [facility];  Service: Cardiovascular;  Laterality: N/A;   TONSILLECTOMY     Patient Active Problem List   Diagnosis Date Noted   Parkinson's disease 07/27/2022   Abnormal nuclear stress test    Hyperglycemia due to  type 2 diabetes mellitus ([place]) 02/07/2022   Long term (current) use of insulin ([place]) 02/07/2022   Obesity 02/07/2022   Lower respiratory infection 02/07/2022   Reactive airway disease 02/07/2022   Unilateral inguinal hernia without obstruction or gangrene 12/13/2021   Medication side effect 01/25/2021   Balanitis 01/25/2021   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, bilateral ([place]) 07/29/2019   COPD (chronic obstructive pulmonary disease) ([place]) 12/15/2016   Skin tag 12/15/2016   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   Adjustment disorder with mixed anxiety and depressed mood 04/02/2013   ABNORMAL ELECTROCARDIOGRAM 10/22/2010   OTHER HEART BLOCK 10/14/2010   GERD 05/22/2009   Insulin dependent diabetes mellitus with complications 82/64/1583   Mixed hyperlipidemia 07/17/2008   Essential hypertension 07/17/2008   History of adenomatous polyps of colon - probable attenuated polyposis 07/17/2008   ELEVATED PROSTATE SPECIFIC ANTIGEN 07/06/2007    ONSET DATE: 07/27/22  REFERRING DIAG: G20  THERAPY DIAG:  Parkinson's disease with fluctuating manifestations, unspecified whether dyskinesia present  Other abnormalities of gait and mobility  Other lack of coordination  Rationale for Evaluation and Treatment Rehabilitation  SUBJECTIVE:  SUBJECTIVE STATEMENT: "Alright"  PERTINENT HISTORY: COPD, DM, Left heart cath and coronary aniography 05/24/22  PAIN:  Are you having pain? Yes: NPRS scale: 0/10 Pain location: low back Pain description: ache Aggravating factors: bending over Relieving factors: moving around  PRECAUTIONS: None  WEIGHT BEARING RESTRICTIONS No  FALLS: Has patient fallen in last 6 months? No  LIVING  ENVIRONMENT: Lives with: lives alone Lives in: House/apartment Stairs: Yes: Internal: 11 steps; on right going up Has following equipment at home: Single point cane, Walker - 2 wheeled, and Wheelchair (manual)  PLOF: Independent  PATIENT GOALS to slow progression of Parkinson's disease  OBJECTIVE:    MUSCLE LENGTH: Hamstrings: bilateral tightness  DTRs:  From office visit 07/27/22- Deep tendon reflexes are 1/4 at the bilateral biceps, triceps, brachioradialis, patella and absent at the bilaterally achilles. Plantar responses are downgoing bilaterally.  POSTURE: rounded shoulders and forward head  LOWER EXTREMITY ROM:   WFL   LOWER EXTREMITY MMT:  grossly 4/5  STAIRS:  Level of Assistance: Modified independence  Stair Negotiation Technique: Alternating Pattern  with Single Rail on Right  Number of Stairs: 4   Height of Stairs: 4 and 6   GAIT: Gait pattern: decreased arm swing- Right, decreased arm swing- Left, decreased step length- Right, decreased step length- Left, decreased stride length, wide BOS, poor foot clearance- Right, and poor foot clearance- Left Distance walked: in clinic distances Assistive device utilized: None Level of assistance: Complete Independence  FUNCTIONAL TESTs:  5 times sit to stand: 14.38s Timed up and go (TUG): 13.97s Berg Balance Scale: 49/56 Functional gait assessment: 25/30   TODAY'S TREATMENT:  09/28/22 NuStep L5 x 6 min  Leg Press 40lb 2x15 Rows 45lb 2x10 Lats pulls 35lb 2x10 Chest press 20lb 2x10 Side step over foam roll onto airex and back S2S on airex OHP yellow ball 2x10 Step ups 4in box on airex 2x10 each  Leg Ext 15lb 2x10 Lec curls 35lb 2x12  09/21/22 NuStep L5 x 6 min  Hamstring curls 25lb 2x15 Leg Ext 15lb 2x12 Seated rows & Lats 35lb 2x10 Chest press 15lb 2x10  S2S OHP 7lb dumbbell 2x10 Side step on and off airex  Side step and tandem walking on balance bean in // bars  09/14/22 NuStep L5 x6 min  Seated Row  & Lats  35lb 2x10 Back Ext black 2x10 S2S on airex with yellow ball chest press 2x10 Shoulder Ext 10lb 2x10 Standing rows 15lb 2x10 Hamstring curls 25lb 2x15 Leg Ext 15lb 2x12 Supine LE stretches with MHP on lumbar spine   09/07/22 NuStep L5x25mns Leg ext 15# 3x10 HS curls 35# 3x10 Leg press 40# 3x10 Shoulder ext 10# 2x10 Cable rows 10# 2x10 S2S with blue ball push out 2x10 Step up 6" from airex  Lateral step ups 6" onto airex with HHA  Calf raises on bar 2x12  08/31/22 NuStep L5 x 7 min  Hamstring curls 35lb 2x12 Leg Ext 15lb 2x10 SLS 3x5'' each, Pt able to hold 10 seconds during the last rep. [id number] touch needed with previous two reps 30lb resisted side step over WaTE bar  S2S on airex 2x10 Tandem and side step on balance beam in // barx  Rows & Lats 35lb 2x12   08/24/22 NuStep L5 x 6 min  HS curls 25# 2x12 Leg Ext 10lb 2x12 Step ups 6" x10 each Resisted gait 40lb 4 way x4 each  S2S on airex w/ OHP yellow ball 2x10 Rows & Lats 35lb 2x10  08/19/22 Walk outdoors  NuStep L5 x35mns  Resisted gait 40# 4 way x4  Step ups 6"  Leg ext 10# 2x10 HS curls 25# 2x10  S2S with red ball push out 2x10 on airex     PATIENT EDUCATION: Education details: POC Person educated: Patient Education method: Explanation Education comprehension: verbalized understanding   HOME EXERCISE PROGRAM: Doing yoga twice a week  GOALS: Goals reviewed with patient? Yes  SHORT TERM GOALS: Target date: 09/08/22  Patient will be independent with initial HEP. Goal status: Met   LONG TERM GOALS: Target date: 10/20/22 Patient will be independent with advanced/ongoing HEP to improve outcomes and carryover.  Goal status: Progressing   2.  Patient will demonstrate decreased fall risk by scoring < 12 sec on TUG. Baseline: 13.97s Goal status: 8.88 Met 08/24/22  3.  Patient will demonstrate improved functional LE strength as demonstrated by <12s on 5xSTS. Baseline: 14.38s Goal  status: 11.99 Met 08/24/22  4.  Patient will demonstrate SLS of at least 10s to improve single leg balance.  Baseline: 3s at best Goal status: Partly met  6 sec 09/28/22   ASSESSMENT:  CLINICAL IMPRESSION: Patient enters doing well.  Some unsteadiness on airex when stepping over foam roll onto airex. Fatigue noted throughout session, more so with sit to stands and step ups.  Good effort with all exercises. Pt stated that he is pleased with his current functional status, and reports no limitations. He reporting having hernia surgery next month,    REHAB POTENTIAL: Good  CLINICAL DECISION MAKING: Stable/uncomplicated  EVALUATION COMPLEXITY: Low  PLAN: PT FREQUENCY: 1-2x/week  PT DURATION: 12 weeks  PLANNED INTERVENTIONS: Therapeutic exercises, Therapeutic activity, Neuromuscular re-education, Balance training, Gait training, Patient/Family education, Self Care, Joint mobilization, Stair training, Cryotherapy, Moist heat, Ionotophoresis 426mml Dexamethasone, and Manual therapy  PLAN FOR NEXT SESSION: side steps on airex, more balance activities, functional strengthening   PHYSICAL THERAPY DISCHARGE SUMMARY  Visits from Start of Care: 9   Patient agrees to discharge. Patient goals were partially met. Patient is being discharged due to being pleased with the current functional level.  MoAndris BaumannDPT [place] JunPTA 09/28/2022, 8:43 AM

## 2022-10-11 NOTE — Progress Notes (Signed)
Surgical Instructions    Your procedure is scheduled on Monday, 10/24/22.  Report to [facility] Main Entrance "A" at 5:30 A.M., then check in with the Admitting office.  Call this number if you have problems the morning of surgery:  [phone]   If you have any questions prior to your surgery date call [phone]: Open Monday-Friday 8am-4pm If you experience any cold or flu symptoms such as cough, fever, chills, shortness of breath, etc. between now and your scheduled surgery, please notify us at the above number     Remember:  Do not eat after midnight the night before your surgery  You may drink clear liquids until 4:30am the morning of your surgery.   Clear liquids allowed are: Water, Non-Citrus Juices (without pulp), Carbonated Beverages, Clear Tea, Black Coffee ONLY (NO MILK, CREAM OR POWDERED CREAMER of any kind), and Gatorade  Patient Instructions  The night before surgery:  No food after midnight. ONLY clear liquids after midnight   The day of surgery (if you have diabetes): Drink ONE (1) 12 oz G2 given to you in your pre admission testing appointment by 4:30am the morning of surgery. Drink in one sitting. Do not sip.  This drink was given to you during your hospital  pre-op appointment visit.  Nothing else to drink after completing the  12 oz bottle of G2.         If you have questions, please contact your surgeon's office.     Take these medicines the morning of surgery with A SIP OF WATER:  amLODipine (NORVASC)  carbidopa-levodopa (SINEMET IR  finasteride (PROSCAR)  omeprazole (PRILOSEC)   IF NEEDED: acetaminophen (TYLENOL)  albuterol (VENTOLIN HFA) inhaler- bring with you the day of surgery carboxymethylcellulose (REFRESH PLUS)   As of today, STOP taking any Aspirin (unless otherwise instructed by your surgeon) Aleve, Naproxen, Ibuprofen, Motrin, Advil, Goody's, BC's, all herbal medications, fish oil, and all vitamins.  WHAT DO I DO ABOUT MY DIABETES  MEDICATION?   Do not take oral diabetes medicines (pills) the morning of surgery.  Do not take JARDIANCE 72 hours prior to surgery. Your last dose will be 10/20/22.  THE NIGHT BEFORE SURGERY, take (70%) 21 units of Insulin Aspart Prot & Aspart (NOVOLOG MIX 70/30 [place]).      THE MORNING OF SURGERY, do not take Insulin Aspart Prot & Aspart (NOVOLOG MIX 70/30 ) or metFORMIN (GLUCOPHAGE).  The day of surgery, do not take other diabetes injectables, including Byetta (exenatide), Bydureon (exenatide ER), Victoza (liraglutide), or Trulicity (dulaglutide).  If your CBG is greater than 220 mg/dL, you may take  of your sliding scale (correction) dose of insulin.   HOW TO MANAGE YOUR DIABETES BEFORE AND AFTER SURGERY  Why is it important to control my blood sugar before and after surgery? Improving blood sugar levels before and after surgery helps healing and can limit problems. A way of improving blood sugar control is eating a healthy diet by:  Eating less sugar and carbohydrates  Increasing activity/exercise  Talking with your doctor about reaching your blood sugar goals High blood sugars (greater than 180 mg/dL) can raise your risk of infections and slow your recovery, so you will need to focus on controlling your diabetes during the weeks before surgery. Make sure that the doctor who takes care of your diabetes knows about your planned surgery including the date and location.  How do I manage my blood sugar before surgery? Check your blood sugar at least 4 times a day,  starting 2 days before surgery, to make sure that the level is not too high or low.  Check your blood sugar the morning of your surgery when you wake up and every 2 hours until you get to the Short Stay unit.  If your blood sugar is less than 70 mg/dL, you will need to treat for low blood sugar: Do not take insulin. Treat a low blood sugar (less than 70 mg/dL) with  cup of clear juice (cranberry or apple), 4 glucose  tablets, OR glucose gel. Recheck blood sugar in 15 minutes after treatment (to make sure it is greater than 70 mg/dL). If your blood sugar is not greater than 70 mg/dL on recheck, call [phone] for further instructions. Report your blood sugar to the short stay nurse when you get to Short Stay.  If you are admitted to the hospital after surgery: Your blood sugar will be checked by the staff and you will probably be given insulin after surgery (instead of oral diabetes medicines) to make sure you have good blood sugar levels. The goal for blood sugar control after surgery is 80-180 mg/dL.            Do not wear jewelry or makeup. Do not wear lotions, powders, perfumes/cologne or deodorant. Men may shave face and neck. Do not bring valuables to the hospital. Do not wear nail polish, gel polish, artificial nails, or any other type of covering on natural nails (fingers and toes) If you have artificial nails or gel coating that need to be removed by a nail salon, please have this removed prior to surgery. Artificial nails or gel coating may interfere with anesthesia's ability to adequately monitor your vital signs.  [facility] is not responsible for any belongings or valuables.    Do NOT Smoke (Tobacco/Vaping)  24 hours prior to your procedure  If you use a CPAP at night, you may bring your mask for your overnight stay.   Contacts, glasses, hearing aids, dentures or partials may not be worn into surgery, please bring cases for these belongings   For patients admitted to the hospital, discharge time will be determined by your treatment team.   Patients discharged the day of surgery will not be allowed to drive home, and someone needs to stay with them for 24 hours.   SURGICAL WAITING ROOM VISITATION Patients having surgery or a procedure may have no more than 2 support people in the waiting area - these visitors may rotate.   Children under the age of 23 must have an adult with them  who is not the patient. If the patient needs to stay at the hospital during part of their recovery, the visitor guidelines for inpatient rooms apply. Pre-op nurse will coordinate an appropriate time for 1 support person to accompany patient in pre-op.  This support person may not rotate.   Please refer to [email or web address] for the visitor guidelines for Inpatients (after your surgery is over and you are in a regular room).    Special instructions:    Oral Hygiene is also important to reduce your risk of infection.  Remember - BRUSH YOUR TEETH THE MORNING OF SURGERY WITH YOUR REGULAR TOOTHPASTE   [facility]- Preparing For Surgery  Before surgery, you can play an important role. Because skin is not sterile, your skin needs to be as free of germs as possible. You can reduce the number of germs on your skin by washing with CHG (chlorahexidine gluconate) Soap before surgery.  CHG  is an antiseptic cleaner which kills germs and bonds with the skin to continue killing germs even after washing.     Please do not use if you have an allergy to CHG or antibacterial soaps. If your skin becomes reddened/irritated stop using the CHG.  Do not shave (including legs and underarms) for at least 48 hours prior to first CHG shower. It is OK to shave your face.  Please follow these instructions carefully.     Shower the NIGHT BEFORE SURGERY and the MORNING OF SURGERY with CHG Soap.   If you chose to wash your hair, wash your hair first as usual with your normal shampoo. After you shampoo, rinse your hair and body thoroughly to remove the shampoo.  Then [employer] and genitals (private parts) with your normal soap and rinse thoroughly to remove soap.  After that Use CHG Soap as you would any other liquid soap. You can apply CHG directly to the skin and wash gently with a scrungie or a clean washcloth.   Apply the CHG Soap to your body ONLY FROM THE NECK DOWN.   Do not use on open wounds or open sores. Avoid contact with your eyes, ears, mouth and genitals (private parts). Wash Face and genitals (private parts)  with your normal soap.   Wash thoroughly, paying special attention to the area where your surgery will be performed.  Thoroughly rinse your body with warm water from the neck down.  DO NOT shower/wash with your normal soap after using and rinsing off the CHG Soap.  Pat yourself dry with a CLEAN TOWEL.  Wear CLEAN PAJAMAS to bed the night before surgery  Place CLEAN SHEETS on your bed the night before your surgery  DO NOT SLEEP WITH PETS.   Day of Surgery: Take a shower with CHG soap. Wear Clean/Comfortable clothing the morning of surgery Do not apply any deodorants/lotions.   Remember to brush your teeth WITH YOUR REGULAR TOOTHPASTE.    If you received a COVID test during your pre-op visit, it is requested that you wear a mask when out in public, stay away from anyone that may not be feeling well, and notify your surgeon if you develop symptoms. If you have been in contact with anyone that has tested positive in the last 10 days, please notify your surgeon.    Please read over the following fact sheets that you were given.

## 2022-10-12 ENCOUNTER — Encounter (HOSPITAL_COMMUNITY)
Admission: RE | Admit: 2022-10-12 | Discharge: 2022-10-12 | Disposition: A | Discharge status: Home / Self Care | Payer: Medicare HMO | Source: Ambulatory Visit | Attending: General Surgery | Admitting: General Surgery

## 2022-10-12 ENCOUNTER — Other Ambulatory Visit: Payer: Self-pay

## 2022-10-12 ENCOUNTER — Encounter (HOSPITAL_COMMUNITY): Payer: Self-pay

## 2022-10-12 VITALS — BP 162/49 | HR 50 | Temp 97.7°F | Resp 18 | Ht 68.0 in | Wt 237.3 lb

## 2022-10-12 DIAGNOSIS — Z01812 Encounter for preprocedural laboratory examination: Secondary | ICD-10-CM | POA: Insufficient documentation

## 2022-10-12 DIAGNOSIS — I2510 Atherosclerotic heart disease of native coronary artery without angina pectoris: Secondary | ICD-10-CM | POA: Diagnosis not present

## 2022-10-12 DIAGNOSIS — I10 Essential (primary) hypertension: Secondary | ICD-10-CM | POA: Diagnosis not present

## 2022-10-12 DIAGNOSIS — E113593 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, bilateral: Secondary | ICD-10-CM | POA: Diagnosis not present

## 2022-10-12 DIAGNOSIS — J449 Chronic obstructive pulmonary disease, unspecified: Secondary | ICD-10-CM | POA: Insufficient documentation

## 2022-10-12 DIAGNOSIS — Z794 Long term (current) use of insulin: Secondary | ICD-10-CM | POA: Diagnosis not present

## 2022-10-12 DIAGNOSIS — Z01818 Encounter for other preprocedural examination: Secondary | ICD-10-CM

## 2022-10-12 DIAGNOSIS — Z8669 Personal history of other diseases of the nervous system and sense organs: Secondary | ICD-10-CM | POA: Insufficient documentation

## 2022-10-12 DIAGNOSIS — I441 Atrioventricular block, second degree: Secondary | ICD-10-CM | POA: Diagnosis not present

## 2022-10-12 DIAGNOSIS — E785 Hyperlipidemia, unspecified: Secondary | ICD-10-CM | POA: Diagnosis not present

## 2022-10-12 DIAGNOSIS — Z87891 Personal history of nicotine dependence: Secondary | ICD-10-CM | POA: Insufficient documentation

## 2022-10-12 LAB — BASIC METABOLIC PANEL
Anion gap: 8 (ref 5–15)
BUN: 11 mg/dL (ref 8–23)
CO2: 27 mmol/L (ref 22–32)
Calcium: 9.6 mg/dL (ref 8.9–10.3)
Chloride: 105 mmol/L (ref 98–111)
Creatinine, Ser: 1 mg/dL (ref 0.61–1.24)
GFR, Estimated: >60 mL/min (ref >60)
Glucose, Bld: 147 mg/dL — ABNORMAL HIGH (ref 70–99)
Potassium: 3.6 mmol/L (ref 3.5–5.1)
Sodium: 140 mmol/L (ref 135–145)

## 2022-10-12 LAB — CBC
HCT: 41.4 % (ref 39.0–52.0)
Hemoglobin: 14 g/dL (ref 13.0–17.0)
MCH: 27.3 pg (ref 26.0–34.0)
MCHC: 33.8 g/dL (ref 30.0–36.0)
MCV: 80.9 fL (ref 80.0–100.0)
Platelets: 241 10*3/uL (ref 150–400)
RBC: 5.12 MIL/uL (ref 4.22–5.81)
RDW: 15.6 % — ABNORMAL HIGH (ref 11.5–15.5)
WBC: 9.8 10*3/uL (ref 4.0–10.5)
nRBC: 0 % (ref 0.0–0.2)

## 2022-10-12 LAB — HEMOGLOBIN A1C
Hgb A1c MFr Bld: 6.5 % — ABNORMAL HIGH (ref 4.8–5.6)
Mean Plasma Glucose: 140 mg/dL

## 2022-10-12 LAB — GLUCOSE, CAPILLARY: Glucose-Capillary: 177 mg/dL — ABNORMAL HIGH (ref 70–99)

## 2022-10-12 NOTE — Progress Notes (Signed)
PCP - Abelino Derrick Cardiologist - Gwyndolyn Kaufman  PPM/ICD - denies   Chest x-ray - n/a EKG - 05/03/22 Stress Test - denies ECHO - 03/25/22 Cardiac Cath - 05/24/22  Sleep Study - denies   Fasting Blood Sugar - 100-110 Checks Blood Sugar multiple times a day    As of today, STOP taking any Aspirin (unless otherwise instructed by your surgeon) Aleve, Naproxen, Ibuprofen, Motrin, Advil, Goody's, BC's, all herbal medications, fish oil, and all vitamins.   ERAS Protcol -yes PRE-SURGERY Ensure or G2- g2 given  COVID TEST- not needed   Anesthesia review: yes, cardiac tests- please review. Cardiac clearance 08/26/22.  Patient denies shortness of breath, fever, cough and chest pain at PAT appointment   All instructions explained to the patient, with a verbal understanding of the material. Patient agrees to go over the instructions while at home for a better understanding. Patient also instructed to self quarantine after being tested for COVID-19. The opportunity to ask questions was provided.

## 2022-10-13 NOTE — Progress Notes (Signed)
Anesthesia Chart Review:  Follows with cardiology for hx of HTN, HLD, Mobitz 1 second degree AV block. Recent cath 05/24/22 showed mild nonobstructive CAD.  Seen by Coletta Memos, NP 08/26/2022 via televisit for preop evaluation.  Per note, "Chart reviewed as part of pre-operative protocol coverage. Given past medical history and time since last visit, based on ACC/AHA guidelines, Karl Lawson. would be at acceptable risk for the planned procedure without further cardiovascular testing. Patient was advised that if he develops new symptoms prior to surgery to contact our office to arrange a follow-up appointment.  He verbalized understanding. His RCRI is a class II risk, 0.9% risk of major cardiac event.  He is able to complete greater than 4 METS of physical activity. Regarding ASA therapy, we recommend continuation of ASA throughout the perioperative period.  However, if the surgeon feels that cessation of ASA is required in the perioperative period, it may be stopped 5-7 days prior to surgery with a plan to resume it as soon as felt to be feasible from a surgical standpoint in the post-operative period."  Former smoker with associated COPD.  Not on any daily inhaled medications.  Follows with neurology for history of Parkinson disease.  Preop labs reviewed, unremarkable. Insulin-dependent DM2 well-controlled, A1c 6.5.  EKG 05/03/2022: Sinus rhythm with first-degree AV block.  Rate 83.  Right bundle-branch block.  Left anterior fascicular block.  Septal infarct, age R.  Cath 05/24/2022:   The left ventricular systolic function is normal.   LV end diastolic pressure is normal.   The left ventricular ejection fraction is 55-65% by visual estimate.   There is no aortic valve stenosis.   Mild, diffuse CAD.  Nonobstructive.   Minimal, diffuse, nonobstructive coronary atherosclerosis.  Continue preventive therapy.  TTE 03/25/2022:  1. Pt in Mobitz 1 second degree AV block at time of study.   2.  Left ventricular ejection fraction, by estimation, is 70 to 75%. The  left ventricle has hyperdynamic function. The left ventricle has no  regional wall motion abnormalities. Left ventricular diastolic parameters  are indeterminate.   3. Right ventricular systolic function is normal. The right ventricular  size is mildly enlarged.   4. Left atrial size was moderately dilated.   5. The mitral valve is normal in structure. No evidence of mitral valve  regurgitation. No evidence of mitral stenosis.   6. The aortic valve is tricuspid. Aortic valve regurgitation is not  visualized. Aortic valve sclerosis is present, with no evidence of aortic  valve stenosis.   7. The inferior vena cava is dilated in size with >50% respiratory  variability, suggesting right atrial pressure of 8 mmHg.     [name] [facility] Doctor [facility] Short Stay Center/Anesthesiology Phone [phone] 10/13/2022 4:19 PM

## 2022-10-13 NOTE — Anesthesia Preprocedure Evaluation (Addendum)
Anesthesia Evaluation  Patient identified by MRN, date of birth, ID band Patient awake    Reviewed: Allergy & Precautions, H&P , NPO status , Patient's Chart, lab work & pertinent test results  Airway Mallampati: II  TM Distance: >3 FB Neck ROM: Full    Dental no notable dental hx. (+) Edentulous Upper, Edentulous Lower, Dental Advisory Given   Pulmonary COPD,  COPD inhaler, Patient abstained from smoking., former smoker   Pulmonary exam normal breath sounds clear to auscultation       Cardiovascular Exercise Tolerance: Good hypertension, Pt. on medications  Rhythm:Regular Rate:Normal     Neuro/Psych  Neuromuscular disease  negative psych ROS   GI/Hepatic Neg liver ROS,GERD  Medicated,,  Endo/Other  diabetes, Type 2, Oral Hypoglycemic Agents    Renal/GU negative Renal ROS  negative genitourinary   Musculoskeletal   Abdominal   Peds  Hematology negative hematology ROS (+)   Anesthesia Other Findings   Reproductive/Obstetrics negative OB ROS                             Anesthesia Physical Anesthesia Plan  ASA: 3  Anesthesia Plan: General   Post-op Pain Management: Tylenol PO (pre-op)*   Induction: Intravenous  PONV Risk Score and Plan: 2 and Ondansetron, Dexamethasone and Treatment may vary due to age or medical condition  Airway Management Planned: Oral ETT  Additional Equipment:   Intra-op Plan:   Post-operative Plan: Extubation in OR  Informed Consent: I have reviewed the patients History and Physical, chart, labs and discussed the procedure including the risks, benefits and alternatives for the proposed anesthesia with the patient or authorized representative who has indicated his/her understanding and acceptance.     Dental advisory given  Plan Discussed with: CRNA  Anesthesia Plan Comments: (PAT note by Karoline Caldwell, PA-C: Follows with cardiology for hx of HTN, HLD,  Mobitz 1 second degree AV block. Recent cath 05/24/22 showed mild nonobstructive CAD.  Seen by Coletta Memos, NP 08/26/2022 via televisit for preop evaluation.  Per note, "Chart reviewed as part of pre-operative protocol coverage. Given past medical history and time since last visit, based on ACC/AHA guidelines,Aycen M Lawyer Jr.would be at acceptable risk for the planned procedure without further cardiovascular testing. Patient was advised that if he develops new symptoms prior to surgery to contact our office to arrange a follow-up appointment. He verbalized understanding. His RCRI is a class II risk, 0.9% risk of major cardiac event. He is able to complete greater than 4 METS of physical activity. Regarding ASA therapy, we recommend continuation of ASA throughout the perioperative period. However, if the surgeon feels that cessation of ASA is required in the perioperative period, it may be stopped 5-7 days prior to surgery with a plan to resume it as soon as felt to be feasible from a surgical standpoint in the post-operative period."  Former smoker with associated COPD.  Not on any daily inhaled medications.  Follows with neurology for history of Parkinson disease.  Preop labs reviewed, unremarkable. Insulin-dependent DM2 well-controlled, A1c 6.5.  EKG 05/03/2022: Sinus rhythm with first-degree AV block.  Rate 83.  Right bundle-branch block.  Left anterior fascicular block.  Septal infarct, age R.  Cath 05/24/2022:  The left ventricular systolic function is normal.  LV end diastolic pressure is normal.  The left ventricular ejection fraction is 55-65% by visual estimate.  There is no aortic valve stenosis.  Mild, diffuse CAD. Nonobstructive.  Minimal,  diffuse, nonobstructive coronary atherosclerosis. Continue preventive therapy.  TTE 03/25/2022: 1. Pt in Mobitz 1 second degree AV block at time of study.  2. Left ventricular ejection fraction, by estimation, is 70 to 75%. The   left ventricle has hyperdynamic function. The left ventricle has no  regional wall motion abnormalities. Left ventricular diastolic parameters  are indeterminate.  3. Right ventricular systolic function is normal. The right ventricular  size is mildly enlarged.  4. Left atrial size was moderately dilated.  5. The mitral valve is normal in structure. No evidence of mitral valve  regurgitation. No evidence of mitral stenosis.  6. The aortic valve is tricuspid. Aortic valve regurgitation is not  visualized. Aortic valve sclerosis is present, with no evidence of aortic  valve stenosis.  7. The inferior vena cava is dilated in size with >50% respiratory  variability, suggesting right atrial pressure of 8 mmHg.    )        Anesthesia Quick Evaluation

## 2022-10-19 ENCOUNTER — Ambulatory Visit: Payer: Self-pay

## 2022-10-19 NOTE — Patient Outreach (Signed)
  Care Coordination   10/19/2022 Name: Karl Lawson. MRN: [id number] DOB: October 05, 1946   Care Coordination Outreach Attempts:  An unsuccessful telephone outreach was attempted today to offer the patient information about available care coordination services as a benefit of their health plan.   Follow Up Plan:  Additional outreach attempts will be made to offer the patient care coordination information and services.   Encounter Outcome:  No Answer   Care Coordination Interventions:  No, not indicated    Jone Baseman, RN, MSN [facility] Care Management Coordinator Direct Line [phone]

## 2022-10-23 DIAGNOSIS — E1165 Type 2 diabetes mellitus with hyperglycemia: Secondary | ICD-10-CM | POA: Diagnosis not present

## 2022-10-23 NOTE — H&P (Signed)
Chief Complaint: Follow-up       History of Present Illness: Karl Lawson. is a 76 y.o. male who is seen today as an office consultation at the request of Dr. Ethelene Hal for evaluation of Follow-up .   Patient is a 76 year old male who follows back up today secondary to bilateral hernias.  He states having more pain to the right inguinal hernia than the left.  He does state that the left one is large.   Patient has recently been evaluated by cardiology and states they would recommend perioperative continuation of aspirin.   I had a long discussion with the patient regards to this.  I did discuss with there is a higher chance of bleeding with surgery.  At this point I would recommend him stopping his aspirin 3 days prior to surgery and we can resume the day after.         Review of Systems: A complete review of systems was obtained from the patient.  I have reviewed this information and discussed as appropriate with the patient.  See HPI as well for other ROS.   Review of Systems  Constitutional:  Negative for fever.  HENT:  Negative for congestion.   Eyes:  Negative for blurred vision.  Respiratory:  Negative for cough, shortness of breath and wheezing.   Cardiovascular:  Negative for chest pain and palpitations.  Gastrointestinal:  Negative for heartburn.  Genitourinary:  Negative for dysuria.  Musculoskeletal:  Negative for myalgias.  Skin:  Negative for rash.  Neurological:  Negative for dizziness and headaches.  Psychiatric/Behavioral:  Negative for depression and suicidal ideas.   All other systems reviewed and are negative.       Medical History: Past Medical History Past Medical History: Diagnosis        Date            Diabetes mellitus without complication (CMS-HCC)          There is no problem list on file for this patient.     Past Surgical History Past Surgical History: Procedure       Laterality         Date            HERNIA REPAIR                             Allergies No Known Allergies     Current Outpatient Medications on File Prior to Visit Medication       Sig       Dispense         Refill            carbidopa-levodopa (SINEMET) 25-100 mg tablet    Take by mouth                                     amLODIPine (NORVASC) 5 MG tablet          Take 1 tablet by mouth once daily                               aspirin 81 MG chewable tablet           1 tablet  empagliflozin (JARDIANCE) 10 mg tablet      1 tablet                                     finasteride (PROSCAR) 5 mg tablet   1 tablet                                     insulin LISPRO PROTAMINE-LISPRO (HUMALOG 75-25) 100 unit/mL (75-25) injection          Inject subcutaneously                          losartan-hydrochlorothiazide (HYZAAR) 100-25 mg tablet    Take 1 tablet by mouth once daily                                 metFORMIN (GLUCOPHAGE) 1000 MG tablet                                             multivitamin with minerals tablet         Take 1 tablet by mouth once daily                               omeprazole (PRILOSEC) 40 MG DR capsule           1 capsule                                  simvastatin (ZOCOR) 40 MG tablet   1 tablet in the evening                 No current facility-administered medications on file prior to visit.     Family History Family History Problem           Relation           Age of Onset            High blood pressure (Hypertension)   Father               Coronary Artery Disease (Blocked arteries around heart)     Father               Diabetes          Brother                    Social History   Tobacco Use Smoking Status           Every Day            Types: Cigarettes Smokeless Tobacco   Never     Social History Social History     Socioeconomic History            Marital status:  Married Tobacco Use            Smoking status:          Every Day  Types: Cigarettes             Smokeless tobacco:    Never Vaping Use            Vaping Use:    Never used Substance and Sexual Activity            Sexual activity:            Yes       Objective:     There were no vitals filed for this visit.  There is no height or weight on file to calculate BMI. Physical Exam Constitutional:      Appearance: Normal appearance.  HENT:     Head: Normocephalic and atraumatic.     Nose: Nose normal. No congestion.     Mouth/Throat:     Mouth: Mucous membranes are moist.     Pharynx: Oropharynx is clear.  Eyes:     Pupils: Pupils are equal, round, and reactive to light.  Cardiovascular:     Rate and Rhythm: Normal rate and regular rhythm.     Pulses: Normal pulses.     Heart sounds: Normal heart sounds. No murmur heard.    No friction rub. No gallop.  Pulmonary:     Effort: Pulmonary effort is normal. No respiratory distress.     Breath sounds: Normal breath sounds. No stridor. No wheezing, rhonchi or rales.  Abdominal:     General: Abdomen is flat.     Hernia: A hernia is present. Hernia is present in the left inguinal area and right inguinal area.  Musculoskeletal:        General: Normal range of motion.     Cervical back: Normal range of motion.  Skin:    General: Skin is warm and dry.  Neurological:     General: No focal deficit present.     Mental Status: He is alert and oriented to person, place, and time.  Psychiatric:        Mood and Affect: Mood normal.        Thought Content: Thought content normal.          Assessment and Plan: Diagnoses and all orders for this visit:   Bilateral inguinal hernia without obstruction or gangrene, recurrence not specified     Karl Lawson. is a 76 y.o. male    1.          We will proceed to the OR for a laparoscopic bilateral inguinal hernia repair with mesh. 2.         All risks and benefits were discussed with the patient, to generally include infection, bleeding, damage to surrounding structures, acute  and chronic nerve pain, and recurrence. Alternatives were offered and described.  All questions were answered and the patient voiced understanding of the procedure and wishes to proceed at this point.             No follow-ups on file.   Ralene Ok, MD, [facility], [facility]

## 2022-10-24 ENCOUNTER — Encounter (HOSPITAL_COMMUNITY): Payer: Self-pay | Admitting: General Surgery

## 2022-10-24 ENCOUNTER — Encounter (HOSPITAL_COMMUNITY)
Admission: RE | Disposition: A | Discharge status: Home / Self Care | Payer: Self-pay | Source: Home / Self Care | Attending: General Surgery

## 2022-10-24 ENCOUNTER — Ambulatory Visit (HOSPITAL_BASED_OUTPATIENT_CLINIC_OR_DEPARTMENT_OTHER): Payer: Medicare HMO | Admitting: Anesthesiology

## 2022-10-24 ENCOUNTER — Other Ambulatory Visit: Payer: Self-pay

## 2022-10-24 ENCOUNTER — Ambulatory Visit (HOSPITAL_COMMUNITY): Payer: Medicare HMO | Admitting: Physician Assistant

## 2022-10-24 ENCOUNTER — Ambulatory Visit (HOSPITAL_COMMUNITY)
Admission: RE | Admit: 2022-10-24 | Discharge: 2022-10-24 | Disposition: A | Discharge status: Home / Self Care | Payer: Medicare HMO | Attending: General Surgery | Admitting: General Surgery

## 2022-10-24 DIAGNOSIS — E113593 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, bilateral: Secondary | ICD-10-CM

## 2022-10-24 DIAGNOSIS — E119 Type 2 diabetes mellitus without complications: Secondary | ICD-10-CM | POA: Insufficient documentation

## 2022-10-24 DIAGNOSIS — K4020 Bilateral inguinal hernia, without obstruction or gangrene, not specified as recurrent: Secondary | ICD-10-CM | POA: Diagnosis not present

## 2022-10-24 DIAGNOSIS — Z7982 Long term (current) use of aspirin: Secondary | ICD-10-CM | POA: Insufficient documentation

## 2022-10-24 DIAGNOSIS — F17210 Nicotine dependence, cigarettes, uncomplicated: Secondary | ICD-10-CM | POA: Insufficient documentation

## 2022-10-24 DIAGNOSIS — J449 Chronic obstructive pulmonary disease, unspecified: Secondary | ICD-10-CM | POA: Diagnosis not present

## 2022-10-24 DIAGNOSIS — Z7984 Long term (current) use of oral hypoglycemic drugs: Secondary | ICD-10-CM | POA: Insufficient documentation

## 2022-10-24 DIAGNOSIS — Z794 Long term (current) use of insulin: Secondary | ICD-10-CM | POA: Insufficient documentation

## 2022-10-24 DIAGNOSIS — K219 Gastro-esophageal reflux disease without esophagitis: Secondary | ICD-10-CM | POA: Diagnosis not present

## 2022-10-24 DIAGNOSIS — Z87891 Personal history of nicotine dependence: Secondary | ICD-10-CM | POA: Diagnosis not present

## 2022-10-24 DIAGNOSIS — I10 Essential (primary) hypertension: Secondary | ICD-10-CM | POA: Insufficient documentation

## 2022-10-24 DIAGNOSIS — R69 Illness, unspecified: Secondary | ICD-10-CM | POA: Diagnosis not present

## 2022-10-24 HISTORY — PX: INGUINAL HERNIA REPAIR: SHX194

## 2022-10-24 LAB — GLUCOSE, CAPILLARY
Glucose-Capillary: 126 mg/dL — ABNORMAL HIGH (ref 70–99)
Glucose-Capillary: 137 mg/dL — ABNORMAL HIGH (ref 70–99)

## 2022-10-24 SURGERY — REPAIR, HERNIA, INGUINAL, BILATERAL, LAPAROSCOPIC
Anesthesia: General | Site: Inguinal | Laterality: Bilateral

## 2022-10-24 MED ORDER — LIDOCAINE 2% (20 MG/ML) 5 ML SYRINGE
INTRAMUSCULAR | Status: AC
  Filled 2022-10-24: qty 5

## 2022-10-24 MED ORDER — LIDOCAINE 2% (20 MG/ML) 5 ML SYRINGE
INTRAMUSCULAR | Status: DC | PRN
  Administered 2022-10-24: 60 mg via INTRAVENOUS

## 2022-10-24 MED ORDER — ORAL CARE MOUTH RINSE: 15.0000 mL | Freq: Once | OROMUCOSAL | Status: AC

## 2022-10-24 MED ORDER — LACTATED RINGERS IV SOLN: INTRAVENOUS | Status: DC

## 2022-10-24 MED ORDER — BUPIVACAINE HCL (PF) 0.25 % IJ SOLN
INTRAMUSCULAR | Status: DC | PRN
  Administered 2022-10-24: 8 mL

## 2022-10-24 MED ORDER — ROCURONIUM BROMIDE 10 MG/ML (PF) SYRINGE
PREFILLED_SYRINGE | INTRAVENOUS | Status: AC
  Filled 2022-10-24: qty 10

## 2022-10-24 MED ORDER — TRAMADOL HCL 50 MG PO TABS
50.0000 mg | ORAL_TABLET | Freq: Once | ORAL | Status: AC
  Administered 2022-10-24: 50 mg via ORAL

## 2022-10-24 MED ORDER — MIDAZOLAM HCL 2 MG/2ML IJ SOLN
INTRAMUSCULAR | Status: AC
  Filled 2022-10-24: qty 2

## 2022-10-24 MED ORDER — BUPIVACAINE HCL (PF) 0.25 % IJ SOLN
INTRAMUSCULAR | Status: AC
  Filled 2022-10-24: qty 30

## 2022-10-24 MED ORDER — 0.9 % SODIUM CHLORIDE (POUR BTL) OPTIME
TOPICAL | Status: DC | PRN
  Administered 2022-10-24: 1000 mL

## 2022-10-24 MED ORDER — TRAMADOL HCL 50 MG PO TABS
ORAL_TABLET | ORAL | Status: AC
  Filled 2022-10-24: qty 1

## 2022-10-24 MED ORDER — DEXAMETHASONE SODIUM PHOSPHATE 10 MG/ML IJ SOLN
INTRAMUSCULAR | Status: AC
  Filled 2022-10-24: qty 1

## 2022-10-24 MED ORDER — FENTANYL CITRATE (PF) 100 MCG/2ML IJ SOLN: 25.0000 ug | INTRAMUSCULAR | Status: DC | PRN

## 2022-10-24 MED ORDER — ONDANSETRON HCL 4 MG/2ML IJ SOLN
INTRAMUSCULAR | Status: DC | PRN
  Administered 2022-10-24: 4 mg via INTRAVENOUS

## 2022-10-24 MED ORDER — CHLORHEXIDINE GLUCONATE 0.12 % MT SOLN
15.0000 mL | Freq: Once | OROMUCOSAL | Status: AC
  Administered 2022-10-24: 15 mL via OROMUCOSAL
  Filled 2022-10-24: qty 15

## 2022-10-24 MED ORDER — ACETAMINOPHEN 500 MG PO TABS
1000.0000 mg | ORAL_TABLET | ORAL | Status: AC
  Administered 2022-10-24: 1000 mg via ORAL

## 2022-10-24 MED ORDER — ONDANSETRON HCL 4 MG/2ML IJ SOLN
INTRAMUSCULAR | Status: AC
  Filled 2022-10-24: qty 2

## 2022-10-24 MED ORDER — INSULIN ASPART 100 UNIT/ML IJ SOLN: 0.0000 [IU] | INTRAMUSCULAR | Status: DC | PRN

## 2022-10-24 MED ORDER — EPHEDRINE SULFATE-NACL 50-0.9 MG/10ML-% IV SOSY
PREFILLED_SYRINGE | INTRAVENOUS | Status: DC | PRN
  Administered 2022-10-24: 5 mg via INTRAVENOUS

## 2022-10-24 MED ORDER — PHENYLEPHRINE 80 MCG/ML (10ML) SYRINGE FOR IV PUSH (FOR BLOOD PRESSURE SUPPORT)
PREFILLED_SYRINGE | INTRAVENOUS | Status: DC | PRN
  Administered 2022-10-24: 80 ug via INTRAVENOUS

## 2022-10-24 MED ORDER — FENTANYL CITRATE (PF) 250 MCG/5ML IJ SOLN
INTRAMUSCULAR | Status: DC | PRN
  Administered 2022-10-24 (×3): 50 ug via INTRAVENOUS

## 2022-10-24 MED ORDER — PROPOFOL 1000 MG/100ML IV EMUL
INTRAVENOUS | Status: AC
  Filled 2022-10-24: qty 300

## 2022-10-24 MED ORDER — PROPOFOL 10 MG/ML IV BOLUS
INTRAVENOUS | Status: AC
  Filled 2022-10-24: qty 20

## 2022-10-24 MED ORDER — SUGAMMADEX SODIUM 200 MG/2ML IV SOLN
INTRAVENOUS | Status: DC | PRN
  Administered 2022-10-24: 300 mg via INTRAVENOUS

## 2022-10-24 MED ORDER — ENSURE PRE-SURGERY PO LIQD: 296.0000 mL | Freq: Once | ORAL | Status: DC

## 2022-10-24 MED ORDER — MIDAZOLAM HCL 2 MG/2ML IJ SOLN
INTRAMUSCULAR | Status: DC | PRN
  Administered 2022-10-24: 1 mg via INTRAVENOUS

## 2022-10-24 MED ORDER — ACETAMINOPHEN 500 MG PO TABS
1000.0000 mg | ORAL_TABLET | Freq: Once | ORAL | Status: AC
  Filled 2022-10-24: qty 2

## 2022-10-24 MED ORDER — ROCURONIUM BROMIDE 10 MG/ML (PF) SYRINGE
PREFILLED_SYRINGE | INTRAVENOUS | Status: DC | PRN
  Administered 2022-10-24: 20 mg via INTRAVENOUS
  Administered 2022-10-24: 50 mg via INTRAVENOUS
  Administered 2022-10-24: 20 mg via INTRAVENOUS

## 2022-10-24 MED ORDER — CHLORHEXIDINE GLUCONATE CLOTH 2 % EX PADS: 6.0000 | MEDICATED_PAD | Freq: Once | CUTANEOUS | Status: DC

## 2022-10-24 MED ORDER — TRAMADOL HCL 50 MG PO TABS: 50.0000 mg | ORAL_TABLET | Freq: Four times a day (QID) | ORAL | 0 refills | Status: DC | PRN

## 2022-10-24 MED ORDER — CEFAZOLIN SODIUM-DEXTROSE 2-4 GM/100ML-% IV SOLN
2.0000 g | INTRAVENOUS | Status: AC
  Administered 2022-10-24: 2 g via INTRAVENOUS
  Filled 2022-10-24: qty 100

## 2022-10-24 MED ORDER — FENTANYL CITRATE (PF) 250 MCG/5ML IJ SOLN
INTRAMUSCULAR | Status: AC
  Filled 2022-10-24: qty 5

## 2022-10-24 MED ORDER — PROPOFOL 10 MG/ML IV BOLUS
INTRAVENOUS | Status: DC | PRN
  Administered 2022-10-24: 125 ug/kg/min via INTRAVENOUS
  Administered 2022-10-24: 40 mg via INTRAVENOUS
  Administered 2022-10-24: 100 mg via INTRAVENOUS

## 2022-10-24 MED ORDER — PHENYLEPHRINE 80 MCG/ML (10ML) SYRINGE FOR IV PUSH (FOR BLOOD PRESSURE SUPPORT)
PREFILLED_SYRINGE | INTRAVENOUS | Status: AC
  Filled 2022-10-24: qty 10

## 2022-10-24 SURGICAL SUPPLY — 49 items
APPLIER CLIP LOGIC TI 5 (MISCELLANEOUS) IMPLANT
BAG COUNTER SPONGE SURGICOUNT (BAG) ×1 IMPLANT
CANISTER SUCT 3000ML PPV (MISCELLANEOUS) IMPLANT
CHLORAPREP W/TINT 26 (MISCELLANEOUS) ×1 IMPLANT
COVER SURGICAL LIGHT HANDLE (MISCELLANEOUS) ×1 IMPLANT
DERMABOND ADVANCED .7 DNX12 (GAUZE/BANDAGES/DRESSINGS) ×1 IMPLANT
DISSECTOR BLUNT TIP ENDO 5MM (MISCELLANEOUS) IMPLANT
DRAPE LAPAROSCOPIC ABDOMINAL (DRAPES) ×1 IMPLANT
ELECT REM PT RETURN 9FT ADLT (ELECTROSURGICAL) ×1
ELECTRODE REM PT RTRN 9FT ADLT (ELECTROSURGICAL) ×1 IMPLANT
GLOVE BIO SURGEON STRL SZ7.5 (GLOVE) ×1 IMPLANT
GLOVE SURG SYN 7.5  E (GLOVE) ×1
GLOVE SURG SYN 7.5 E (GLOVE) ×1 IMPLANT
GLOVE SURG SYN 7.5 PF PI (GLOVE) ×1 IMPLANT
GOWN STRL REUS W/ TWL LRG LVL3 (GOWN DISPOSABLE) ×2 IMPLANT
GOWN STRL REUS W/ TWL XL LVL3 (GOWN DISPOSABLE) ×1 IMPLANT
GOWN STRL REUS W/TWL LRG LVL3 (GOWN DISPOSABLE) ×2
GOWN STRL REUS W/TWL XL LVL3 (GOWN DISPOSABLE) ×1
KIT BASIN OR (CUSTOM PROCEDURE TRAY) ×1 IMPLANT
KIT TURNOVER KIT B (KITS) ×1 IMPLANT
MESH 3DMAX 5X7 LT XLRG (Mesh General) IMPLANT
MESH 3DMAX 5X7 RT XLRG (Mesh General) IMPLANT
NDL INSUFFLATION 14GA 120MM (NEEDLE) IMPLANT
NEEDLE INSUFFLATION 14GA 120MM (NEEDLE) IMPLANT
NS IRRIG 1000ML POUR BTL (IV SOLUTION) ×1 IMPLANT
PAD ARMBOARD 7.5X6 YLW CONV (MISCELLANEOUS) ×2 IMPLANT
RELOAD STAPLE 4.0 BLU F/HERNIA (INSTRUMENTS) IMPLANT
RELOAD STAPLE 4.8 BLK F/HERNIA (STAPLE) IMPLANT
RELOAD STAPLE HERNIA 4.0 BLUE (INSTRUMENTS) IMPLANT
RELOAD STAPLE HERNIA 4.8 BLK (STAPLE) IMPLANT
SCISSORS LAP 5X35 DISP (ENDOMECHANICALS) ×1 IMPLANT
SET IRRIG TUBING LAPAROSCOPIC (IRRIGATION / IRRIGATOR) IMPLANT
SET TROCAR LAP APPLE-HUNT 5MM (ENDOMECHANICALS) IMPLANT
SET TUBE SMOKE EVAC HIGH FLOW (TUBING) ×1 IMPLANT
STAPLER HERNIA 12 8.5 360D (INSTRUMENTS) IMPLANT
SUT MNCRL AB 4-0 PS2 18 (SUTURE) ×1 IMPLANT
SUT VIC AB 1 CT1 27 (SUTURE)
SUT VIC AB 1 CT1 27XBRD ANBCTR (SUTURE) IMPLANT
TOWEL GREEN STERILE (TOWEL DISPOSABLE) ×1 IMPLANT
TOWEL GREEN STERILE FF (TOWEL DISPOSABLE) ×1 IMPLANT
TRAY FOLEY W/BAG SLVR 16FR (SET/KITS/TRAYS/PACK) ×1
TRAY FOLEY W/BAG SLVR 16FR ST (SET/KITS/TRAYS/PACK) ×1 IMPLANT
TRAY LAPAROSCOPIC MC (CUSTOM PROCEDURE TRAY) ×1 IMPLANT
TROCAR OPTICAL SHORT 5MM (TROCAR) ×1 IMPLANT
TROCAR OPTICAL SLV SHORT 5MM (TROCAR) ×1 IMPLANT
TROCAR XCEL 12X100 BLDLESS (ENDOMECHANICALS) ×1 IMPLANT
TROCAR Z THREAD OPTICAL 12X100 (TROCAR) IMPLANT
WARMER LAPAROSCOPE (MISCELLANEOUS) ×1 IMPLANT
WATER STERILE IRR 1000ML POUR (IV SOLUTION) ×1 IMPLANT

## 2022-10-24 NOTE — Discharge Instructions (Signed)
[facility] _______[facility]  UMBILICAL OR INGUINAL HERNIA REPAIR: POST OP INSTRUCTIONS  Always review your discharge instruction sheet given to you by the facility where your surgery was performed. IF YOU HAVE DISABILITY OR FAMILY LEAVE FORMS, YOU MUST BRING THEM TO THE OFFICE FOR PROCESSING.   DO NOT GIVE THEM TO YOUR DOCTOR.  1. A  prescription for pain medication may be given to you upon discharge.  Take your pain medication as prescribed, if needed.  If narcotic pain medicine is not needed, then you may take acetaminophen (Tylenol) or ibuprofen (Advil) as needed. 2. Take your usually prescribed medications unless otherwise directed. If you need a refill on your pain medication, please contact your pharmacy.  They will contact our office to request authorization. Prescriptions will not be filled after 5 pm or on week-ends. 3. You should follow a light diet the first 24 hours after arrival home, such as soup and crackers, etc.  Be sure to include lots of fluids daily.  Resume your normal diet the day after surgery. 4.Most patients will experience some swelling and bruising around the umbilicus or in the groin and scrotum.  Ice packs and reclining will help.  Swelling and bruising can take several days to resolve.  6. It is common to experience some constipation if taking pain medication after surgery.  Increasing fluid intake and taking a stool softener (such as Colace) will usually help or prevent this problem from occurring.  A mild laxative (Milk of Magnesia or Miralax) should be taken according to package directions if there are no bowel movements after 48 hours. 7. Unless discharge instructions indicate otherwise, you may remove your bandages 24-48 hours after surgery, and you may shower at that time.  You may have steri-strips (small skin tapes) in place directly over the incision.  These strips should be left on the skin for 7-10 days.  If your surgeon used skin glue on the  incision, you may shower in 24 hours.  The glue will flake off over the next 2-3 weeks.  Any sutures or staples will be removed at the office during your follow-up visit. 8. ACTIVITIES:  You may resume regular (light) daily activities beginning the next day--such as daily self-care, walking, climbing stairs--gradually increasing activities as tolerated.  You may have sexual intercourse when it is comfortable.  Refrain from any heavy lifting or straining until approved by your doctor.  a.You may drive when you are no longer taking prescription pain medication, you can comfortably wear a seatbelt, and you can safely maneuver your car and apply brakes. b.RETURN TO WORK:   _____________________________________________  9.You should see your doctor in the office for a follow-up appointment approximately 2-3 weeks after your surgery.  Make sure that you call for this appointment within a day or two after you arrive home to insure a convenient appointment time. 10.OTHER INSTRUCTIONS: _________________________    _____________________________________  WHEN TO CALL YOUR DOCTOR: Fever over 101.0 Inability to urinate Nausea and/or vomiting Extreme swelling or bruising Continued bleeding from incision. Increased pain, redness, or drainage from the incision  The clinic staff is available to answer your questions during regular business hours.  Please don't hesitate to call and ask to speak to one of the nurses for clinical concerns.  If you have a medical emergency, go to the nearest emergency room or call 911.  A surgeon from [facility] is always on call at the hospital   [address], [address],   [place], [place]  [address] ?  [address] [address], [place], [place]   [address] [phone] ? [phone] ? FAX [phone] Web site: [email or web address]  

## 2022-10-24 NOTE — Op Note (Addendum)
10/24/2022  9:00 AM  PATIENT:  Karl Lawson.  76 y.o. male  PRE-OPERATIVE DIAGNOSIS:  BILATERAL INGUINAL HERNIA  POST-OPERATIVE DIAGNOSIS:  BILATERAL INDIRECT INGUINAL HERNIA  PROCEDURE:  Procedure(s): LAPAROSCOPIC BILATERAL INGUINAL HERNIA REPAIR WITH MESH (Bilateral)  SURGEON:  Surgeon(s) and Role:    * Ralene Ok, MD - Primary  ASSISTANTS: Marja Kays, PGY-5   ANESTHESIA:   local and general  EBL:  20 mL   BLOOD ADMINISTERED:none  DRAINS: none   LOCAL MEDICATIONS USED:  BUPIVICAINE   SPECIMEN:  No Specimen  DISPOSITION OF SPECIMEN:  N/A  COUNTS:  YES  TOURNIQUET:  * No tourniquets in log *  DICTATION: .Dragon Dictation  Counts: reported as correct x 2  Findings:  The patient had a small indirect right & left indirect hernia  Indications for procedure:  The patient is a 76 year old male with bilateral hernias for several months. Patient complained of symptomatology to his bilateral inguinal areas. The patient was taken back for elective inguinal hernia repair.  Details of the procedure:  The patient was taken back to the operating room. The patient was placed in supine position with bilateral SCDs in place.  The patient underwent GETA.  A foley catheter was placed. The patient was prepped and draped in the usual sterile fashion.  After appropriate anitbiotics were confirmed, a time-out was confirmed and all facts were verified.  0.25% Marcaine was used to infiltrate the umbilical area. A 11-blade was used to cut down the skin and blunt dissection was used to get the anterior fashion.  The anterior fascia was incised approximately 1 cm and the muscles were retracted laterally. Blunt dissection was then used to create a space in the preperitoneal area. At this time a 10 mm camera was then introduced into the space and advanced the pubic tubercle and a 12 mm trocar was placed over this and insufflation was started.  At this time and space was created from  medial to laterally the preperitoneal space on the right.  Cooper's ligament was initially cleaned off.  The hernia sac was identified and dissected away from the cremesteric muscle fibers.  The hernia was seen in the indirect space. Dissection of the hernia sac was undertaken the vas deferens was identified and protected in all parts of the case.   There was a small tear into the hernia sac. A Veress needle right upper quadrant to help evacuate the intraperitoneal air.    Once the hernia sac was taken down to approximately the umbilicus a Bard 3D Max mesh, size: [name], was  introduced into the preperitoneal space.  The mesh was brought over to cover the direct and indirect hernia spaces.  This was anchored into place and secured to Cooper's ligament with 4.39m staples from a Coviden hernia stapler. It was anchored to the anterior abdominal wall with 4.8 mm staples. The hernia sac was seen lying posterior to the mesh. There was no staples placed laterally.   The exact same dissection took place on the left side. The spermatic cord was identified.  The hernia sac was identified and dissected away from the cremesteric muscle fibers.  The hernia was seen in the indirect space. Dissection of the hernia sac was undertaken the vas deferens was identified and protected in all parts of the case.   Once the hernia sac was taken down to approximately the umbilicus a Bard 3D Max mesh, size: [name] was  introduced into the preperitoneal space.  The mesh was brought  over to cover the direct and indirect hernia spaces.  This was anchored into place and secured to Cooper's ligament with 4.27m staples from a Cov[place] hernia stapler. It was anchored to the anterior abdominal wall with 4.8 mm staples. The hernia sac was seen lying posterior to the mesh. There was no staples placed laterally.  The right sided tear of the peritoneum was reapproximated with staples via the universal hernia stapler.  The insufflation was  evacuated and the peritoneum was seen posterior to the mesh bilaterally. The trochars were removed. The anterior fascia was reapproximated using #1 Vicryl on a UR- 6.  Intra-abdominal air was evacuated and the Veress needle removed. The skin was reapproximated using 4-0 Monocryl subcuticular fashion and was dressed with Dermabond. The  patient was awakened from general anesthesia and taken to recovery in stable condition.  I was personally present during the key and critical portions of this procedure and immediately available throughout the entire procedure, as documented in my operative note.    PLAN OF CARE: Discharge to home after PACU  PATIENT DISPOSITION:  PACU - hemodynamically stable.   Delay start of Pharmacological VTE agent (>24hrs) due to surgical blood loss or risk of bleeding: not applicable

## 2022-10-24 NOTE — Interval H&P Note (Signed)
History and Physical Interval Note:  10/24/2022 7:07 AM  Karl Lawson.  has presented today for surgery, with the diagnosis of BILATERAL INGUINAL HERNIA.  The various methods of treatment have been discussed with the patient and family. After consideration of risks, benefits and other options for treatment, the patient has consented to  Procedure(s): LAPAROSCOPIC BILATERAL INGUINAL HERNIA REPAIR WITH MESH (Bilateral) as a surgical intervention.  The patient's history has been reviewed, patient examined, no change in status, stable for surgery.  I have reviewed the patient's chart and labs.  Questions were answered to the patient's satisfaction.     Ralene Ok

## 2022-10-24 NOTE — Transfer of Care (Signed)
Immediate Anesthesia Transfer of Care Note  Patient: Karl Lawson.  Procedure(s) Performed: LAPAROSCOPIC BILATERAL INGUINAL HERNIA REPAIR WITH MESH (Bilateral: Inguinal)  Patient Location: PACU  Anesthesia Type:General  Level of Consciousness: drowsy and patient cooperative  Airway & Oxygen Therapy: Patient connected to face mask oxygen  Post-op Assessment: Report given to RN and Post -op Vital signs reviewed and stable  Post vital signs: Reviewed and stable  Last Vitals:  Vitals Value Taken Time  BP 162/57 10/24/22 0912  Temp    Pulse 45 10/24/22 0914  Resp 28 10/24/22 0914  SpO2 94 % 10/24/22 0914  Vitals shown include unvalidated device data.  Last Pain:  Vitals:   10/24/22 0617  TempSrc:   PainSc: 0-No pain         Complications: There were no known notable events for this encounter.

## 2022-10-24 NOTE — Anesthesia Postprocedure Evaluation (Signed)
Anesthesia Post Note  Patient: Karl Lawson.  Procedure(s) Performed: LAPAROSCOPIC BILATERAL INGUINAL HERNIA REPAIR WITH MESH (Bilateral: Inguinal)     Patient location during evaluation: PACU Anesthesia Type: General Level of consciousness: awake and alert Pain management: pain level controlled Vital Signs Assessment: post-procedure vital signs reviewed and stable Respiratory status: spontaneous breathing, nonlabored ventilation and respiratory function stable Cardiovascular status: blood pressure returned to baseline and stable Postop Assessment: no apparent nausea or vomiting Anesthetic complications: no  There were no known notable events for this encounter.  Last Vitals:  Vitals:   10/24/22 0955 10/24/22 1000  BP: (!) 161/51 (!) 160/51  Pulse: (!) 51 (!) 46  Resp:  (!) 23  Temp:    SpO2: 93% 93%    Last Pain:  Vitals:   10/24/22 1000  TempSrc:   PainSc: 4                  Merridith Dershem,W. EDMOND

## 2022-10-24 NOTE — Anesthesia Procedure Notes (Signed)
Procedure Name: Intubation Date/Time: 10/24/2022 7:36 AM  Performed by: Ester Rink, CRNAPre-anesthesia Checklist: Patient identified, Emergency Drugs available, Suction available and Patient being monitored Patient Re-evaluated:Patient Re-evaluated prior to induction Oxygen Delivery Method: Circle system utilized Preoxygenation: Pre-oxygenation with 100% oxygen Induction Type: IV induction Ventilation: Mask ventilation without difficulty Laryngoscope Size: Mac and 4 Grade View: Grade I Tube type: Oral Tube size: 7.5 mm Number of attempts: 1 Airway Equipment and Method: Stylet and Oral airway Placement Confirmation: ETT inserted through vocal cords under direct vision, positive ETCO2 and breath sounds checked- equal and bilateral Secured at: 22 cm Tube secured with: Tape Dental Injury: Teeth and Oropharynx as per pre-operative assessment

## 2022-10-25 ENCOUNTER — Encounter (HOSPITAL_COMMUNITY): Payer: Self-pay | Admitting: General Surgery

## 2022-10-26 ENCOUNTER — Telehealth: Payer: Self-pay

## 2022-10-26 NOTE — Telephone Encounter (Signed)
Transition Care Management Follow-up Telephone Call Date of discharge and from where: 10/24/22 [facility]. Dx: Inguinal hernia repair How have you been since you were released from the hospital? I'm still a little sore. Any questions or concerns? No  Items Reviewed: Did the pt receive and understand the discharge instructions provided? Yes  Medications obtained and verified? Yes  Other? No  Any new allergies since your discharge? No  Dietary orders reviewed? No Do you have support at home? Yes   Home Care and Equipment/Supplies: Were home health services ordered? not applicable If so, what is the name of the agency? N/a  Has the agency set up a time to come to the patient's home? not applicable Were any new equipment or medical supplies ordered?  No What is the name of the medical supply agency? N/a Were you able to get the supplies/equipment? not applicable Do you have any questions related to the use of the equipment or supplies? No  Functional Questionnaire: (I = Independent and D = Dependent) ADLs: I  Bathing/Dressing- I  Meal Prep- I  Eating- I  Maintaining continence- I  Transferring/Ambulation- I  Managing Meds- I  Follow up appointments reviewed:  PCP Hospital f/u appt confirmed? No  Scheduled to see N/A on N/A @ N/A. [facility] f/u appt confirmed? Yes  Scheduled to see Dr. Rosendo Gros on 11/11/2022 @ 8:45am. Are transportation arrangements needed? No  If their condition worsens, is the pt aware to call PCP or go to the Emergency Dept.? Yes Was the patient provided with contact information for the PCP's office or ED? Yes Was to pt encouraged to call back with questions or concerns? Yes  Angeline Slim, RN, BSN RN Clinical Supervisor [facility] [employer]

## 2022-11-18 ENCOUNTER — Ambulatory Visit: Payer: Self-pay

## 2022-11-18 NOTE — Patient Outreach (Signed)
  Care Coordination   Follow Up Visit Note   11/18/2022 Name: Aziah Brostrom. MRN: [id number] DOB: 11/27/1945  Mallie Darting. is a 77 y.o. year old male who sees Libby Maw, MD for primary care. I spoke with  Mallie Darting. by phone today.  What matters to the patients health and wellness today?  Patient to schedule routine colonoscopy.    Goals Addressed             This Visit's Progress    Diabetes and Parkinson's Management       Care Coordination Interventions: Evaluation of current treatment plan related to Parkinson's and patient's adherence to plan as established by provider Discussed plans with patient for ongoing care management follow up and provided patient with direct contact information for care management team Provided education to patient about basic DM disease process Reviewed medications with patient and discussed importance of medication adherence Managing diabetes with sugars mostly running around 100 or less.   Patient on medications for parkinson's tremors.  [facility].  No increase in tremors Blood sugar running in the low 100's.  Discussed scheduling routine colonoscopy.          SDOH assessments and interventions completed:  Yes     Care Coordination Interventions:  Yes, provided   Follow up plan: Follow up call scheduled for March    Encounter Outcome:  Pt. Visit Completed   Jone Baseman, RN, MSN [place] [facility] Care Management Coordinator Direct Line [phone]

## 2022-11-18 NOTE — Patient Instructions (Signed)
Visit Information  Thank you for taking time to visit with me today. Please don't hesitate to contact me if I can be of assistance to you.   Following are the goals we discussed today:   Goals Addressed             This Visit's Progress    Diabetes and Parkinson's Management       Care Coordination Interventions: Evaluation of current treatment plan related to Parkinson's and patient's adherence to plan as established by provider Discussed plans with patient for ongoing care management follow up and provided patient with direct contact information for care management team Provided education to patient about basic DM disease process Reviewed medications with patient and discussed importance of medication adherence Managing diabetes with sugars mostly running around 100 or less.   Patient on medications for parkinson's tremors.  [facility].  No increase in tremors Blood sugar running in the low 100's.  Discussed scheduling routine colonoscopy.          Our next appointment is by telephone on 01/13/23 at 1000  Please call the care guide team at [phone] if you need to cancel or reschedule your appointment.   If you are experiencing a Mental Health or [place] or need someone to talk to, please call the Suicide and Crisis Lifeline: 988   Patient verbalizes understanding of instructions and care plan provided today and agrees to view in [place]. Active MyChart status and patient understanding of how to access instructions and care plan via MyChart confirmed with patient.     Telephone follow up appointment with care management team member scheduled for: March  Sayed Apostol J Demetrica Zipp, RN, MSN [place] [facility] Care Management Coordinator Direct Line [phone]

## 2022-12-13 ENCOUNTER — Ambulatory Visit: Payer: Medicare HMO | Admitting: Neurology

## 2022-12-14 ENCOUNTER — Ambulatory Visit (INDEPENDENT_AMBULATORY_CARE_PROVIDER_SITE_OTHER): Payer: Medicare Other | Admitting: Family Medicine

## 2022-12-14 ENCOUNTER — Encounter: Payer: Self-pay | Admitting: Family Medicine

## 2022-12-14 VITALS — BP 130/68 | HR 74 | Temp 97.6°F | Ht 68.0 in | Wt 234.2 lb

## 2022-12-14 DIAGNOSIS — E7800 Pure hypercholesterolemia, unspecified: Secondary | ICD-10-CM | POA: Diagnosis not present

## 2022-12-14 DIAGNOSIS — M5431 Sciatica, right side: Secondary | ICD-10-CM

## 2022-12-14 NOTE — Progress Notes (Signed)
Established Patient Office Visit   Subjective:  Patient ID: Karl Lawson., male    DOB: 06/14/1946  Age: 77 y.o. MRN: [id number]  Chief Complaint  Patient presents with   Follow-up    6 month follow up concerns about right hip/buttocks pain x 3-4 weeks. Patient not fasting.     HPI Encounter Diagnoses  Name Primary?   Pure hypercholesterolemia Yes   Sciatica of right side    Presents with a recent history of right buttock pain that sometimes moves into the back of his right leg.  He has experienced some right greater than left lower back pain.  No recent injury.  Denies paresthesias in the saddle area.  No change in bowel or bladder function.  He is being followed by urology for BPH.  There is sometimes incontinence but this is not new.  Denies weakness in his lower extremities.  Continues simvastatin 40 mg for elevated cholesterol with history of type 2 diabetes.  His wife Steward Drone with end-stage dementia is in a nursing facility.  He sees her every day.  Advised RSV vaccine.   Review of Systems  Constitutional: Negative.   HENT: Negative.    Eyes:  Negative for blurred vision, discharge and redness.  Respiratory: Negative.    Cardiovascular: Negative.   Gastrointestinal:  Negative for abdominal pain.  Genitourinary: Negative.   Musculoskeletal:  Positive for back pain. Negative for myalgias.  Skin:  Negative for rash.  Neurological:  Negative for tingling, loss of consciousness and weakness.  Endo/Heme/Allergies:  Negative for polydipsia.     Current Outpatient Medications:    acetaminophen (TYLENOL) 500 MG tablet, Take 500 mg by mouth every 6 (six) hours as needed for moderate pain., Disp: , Rfl:    albuterol (VENTOLIN HFA) 108 (90 Base) MCG/ACT inhaler, Inhale 2 puffs into the lungs every 6 (six) hours as needed for wheezing or shortness of breath., Disp: , Rfl:    amLODipine (NORVASC) 10 MG tablet, Take 1 tablet (10 mg total) by mouth daily., Disp: 90 tablet, Rfl: 3    aspirin EC 81 MG tablet, Take 81 mg by mouth daily., Disp: , Rfl:    Blood Glucose Monitoring Suppl (ONE TOUCH ULTRA 2) w/Device KIT, Check blood glucose 3 times daily as directed, Disp: , Rfl:    carbidopa-levodopa (SINEMET IR) 25-100 MG tablet, Take 1 tablet by mouth 3 (three) times daily. 6am/10-11am/3-4pm, Disp: 270 tablet, Rfl: 1   carboxymethylcellulose (REFRESH PLUS) 0.5 % SOLN, Place 2 drops into both eyes 2 (two) times daily as needed (dry eyes)., Disp: , Rfl:    clotrimazole-betamethasone (LOTRISONE) cream, Apply 1 Application topically 2 (two) times daily as needed (yeast)., Disp: , Rfl:    finasteride (PROSCAR) 5 MG tablet, Take 5 mg by mouth daily., Disp: , Rfl:    Insulin Aspart Prot & Aspart (NOVOLOG MIX 70/30 [place]), Inject 30 Units into the skin 2 (two) times daily., Disp: , Rfl:    JARDIANCE 10 MG TABS tablet, Take 10 mg by mouth daily., Disp: , Rfl:    losartan (COZAAR) 100 MG tablet, Take 100 mg by mouth daily., Disp: , Rfl:    metFORMIN (GLUCOPHAGE) 1000 MG tablet, Take 1 tablet (1,000 mg total) by mouth 2 (two) times daily with a meal., Disp: 60 tablet, Rfl: 0   Multiple Vitamins-Minerals (PRESERVISION AREDS 2 PO), Take 1 capsule by mouth in the morning and at bedtime., Disp: , Rfl:    omeprazole (PRILOSEC) 40 MG capsule, Take 1  capsule (40 mg total) by mouth daily., Disp: 90 capsule, Rfl: 3   ONETOUCH ULTRA test strip, , Disp: , Rfl:    simvastatin (ZOCOR) 40 MG tablet, Take 1 tablet (40 mg total) by mouth at bedtime., Disp: 90 tablet, Rfl: 3   traMADol (ULTRAM) 50 MG tablet, Take 1 tablet (50 mg total) by mouth every 6 (six) hours as needed. (Patient not taking: Reported on 12/14/2022), Disp: 20 tablet, Rfl: 0   Objective:     BP 130/68 (BP Location: Right Arm, Patient Position: Sitting, Cuff Size: Normal)   Pulse 74   Temp 97.6 F (36.4 C) (Temporal)   Ht '5\' 8"'$  (1.727 m)   Wt 234 lb 3.2 oz (106.2 kg)   SpO2 97%   BMI 35.61 kg/m    Physical Exam Constitutional:       General: He is not in acute distress.    Appearance: Normal appearance. He is not ill-appearing, toxic-appearing or diaphoretic.  HENT:     Head: Normocephalic and atraumatic.     Right Ear: External ear normal.     Left Ear: External ear normal.  Eyes:     General: No scleral icterus.       Right eye: No discharge.        Left eye: No discharge.     Extraocular Movements: Extraocular movements intact.     Conjunctiva/sclera: Conjunctivae normal.  Pulmonary:     Effort: Pulmonary effort is normal. No respiratory distress.  Musculoskeletal:     Lumbar back: No spasms, tenderness or bony tenderness. Normal range of motion. Positive right straight leg raise test. Negative left straight leg raise test.     Right hip: No bony tenderness. Normal range of motion.     Left hip: No bony tenderness. Decreased range of motion.     Comments: Felt twinge in right buttock with straight leg raise on the right.  Skin:    General: Skin is warm and dry.  Neurological:     Mental Status: He is alert and oriented to person, place, and time.  Psychiatric:        Mood and Affect: Mood normal.        Behavior: Behavior normal.      No results found for any visits on 12/14/22.    The ASCVD Risk score (Arnett DK, et al., 2019) failed to calculate for the following reasons:   The valid total cholesterol range is 130 to 320 mg/dL    Assessment & Plan:   Pure hypercholesterolemia -     Lipid panel; Future  Sciatica of right side    Return in about 8 weeks (around 02/08/2023).  Continue Tylenol as needed.  Will try sciatica rehab exercises.  Will follow-up tomorrow morning for fasting lipid profile.  Advise follow-up with urology for BPH and GI for needed colonoscopy.  Advised that he have the RSV vaccine.  Libby Maw, MD

## 2022-12-15 ENCOUNTER — Other Ambulatory Visit (INDEPENDENT_AMBULATORY_CARE_PROVIDER_SITE_OTHER): Payer: Medicare Other

## 2022-12-15 DIAGNOSIS — E7800 Pure hypercholesterolemia, unspecified: Secondary | ICD-10-CM

## 2022-12-15 LAB — LIPID PANEL
Cholesterol: 110 mg/dL (ref 0–200)
HDL: 53.6 mg/dL (ref >39.00)
LDL Cholesterol: 46 mg/dL (ref 0–99)
NonHDL: 56.89
Total CHOL/HDL Ratio: 2
Triglycerides: 56 mg/dL (ref 0.0–149.0)
VLDL: 11.2 mg/dL (ref 0.0–40.0)

## 2022-12-15 NOTE — Progress Notes (Signed)
Lab appt

## 2023-01-06 ENCOUNTER — Ambulatory Visit: Payer: Self-pay

## 2023-01-06 NOTE — Patient Outreach (Signed)
  Care Coordination   Follow Up Visit Note   01/06/2023 Name: Karl Lawson. MRN: [id number] DOB: Nov 18, 1945  Karl Lawson. is a 77 y.o. year old male who sees Libby Maw, MD for primary care. I spoke with  Karl Lawson. by phone today. Recent cataract surgery.  What matters to the patients health and wellness today?  none    Goals Addressed             This Visit's Progress    Diabetes and Parkinson's Management       Care Coordination Interventions: Evaluation of current treatment plan related to Parkinson's and patient's adherence to plan as established by provider Discussed plans with patient for ongoing care management follow up and provided patient with direct contact information for care management team Provided education to patient about basic DM disease process Reviewed medications with patient and discussed importance of medication adherence Managing diabetes with sugars mostly running around 100 or less.   Patient on medications for parkinson's tremors.  [facility].  No increase in tremors Blood sugar this morning 99.  Wife remains in rehab due to dementia.          SDOH assessments and interventions completed:  Yes     Care Coordination Interventions:  Yes, provided   Follow up plan: Follow up call scheduled for May    Encounter Outcome:  Pt. Visit Completed   Jone Baseman, RN, MSN [place] [facility] Care Management Coordinator Direct Line [phone]

## 2023-01-06 NOTE — Patient Instructions (Signed)
Visit Information  Thank you for taking time to visit with me today. Please don't hesitate to contact me if I can be of assistance to you.   Following are the goals we discussed today:   Goals Addressed             This Visit's Progress    Diabetes and Parkinson's Management       Care Coordination Interventions: Evaluation of current treatment plan related to Parkinson's and patient's adherence to plan as established by provider Discussed plans with patient for ongoing care management follow up and provided patient with direct contact information for care management team Provided education to patient about basic DM disease process Reviewed medications with patient and discussed importance of medication adherence Managing diabetes with sugars mostly running around 100 or less.   Patient on medications for parkinson's tremors.  [facility].  No increase in tremors Blood sugar this morning 99.  Wife remains in rehab due to dementia.          Our next appointment is by telephone on 03/10/23 at 1000  Please call the care guide team at [phone] if you need to cancel or reschedule your appointment.   If you are experiencing a Mental Health or [place] or need someone to talk to, please call the Suicide and Crisis Lifeline: 988   Patient verbalizes understanding of instructions and care plan provided today and agrees to view in [place]. Active MyChart status and patient understanding of how to access instructions and care plan via MyChart confirmed with patient.     The patient has been provided with contact information for the care management team and has been advised to call with any health related questions or concerns.   Jone Baseman, RN, MSN [place] [facility] Care Management Coordinator Direct Line [phone]

## 2023-02-07 ENCOUNTER — Ambulatory Visit (INDEPENDENT_AMBULATORY_CARE_PROVIDER_SITE_OTHER): Payer: Medicare Other | Admitting: Family Medicine

## 2023-02-07 ENCOUNTER — Encounter: Payer: Self-pay | Admitting: Family Medicine

## 2023-02-07 VITALS — BP 152/64 | HR 40 | Temp 98.6°F | Ht 68.0 in | Wt 235.0 lb

## 2023-02-07 DIAGNOSIS — F341 Dysthymic disorder: Secondary | ICD-10-CM | POA: Diagnosis not present

## 2023-02-07 DIAGNOSIS — M5431 Sciatica, right side: Secondary | ICD-10-CM

## 2023-02-07 NOTE — Progress Notes (Signed)
Established Patient Office Visit   Subjective:  Patient ID: Karl Lawson., male    DOB: 1946-01-25  Age: 77 y.o. MRN: [id number]  Chief Complaint  Patient presents with   Medical Management of Chronic Issues    2 month follow up on right side sciatica pain. Per pt not much improvement discuss possible in soles to help with pain.     HPI Encounter Diagnoses  Name Primary?   Sciatica of right side Yes   Dysthymia    Sciatica has not improved.  Has not been able to do the exercises given last time.  He does bend over and touch his toes from time to time.  He is sad about his wife's gradual decline.  She is close to not recognizing who he is.  Today is their anniversary.  They have been married 23 years.  Lower back continues to bother him.  Pain is across the lower back and sometimes concentrated to the right.  There is occasional radiation of pain to the right calf.  Denies saddle paresthesias or changes in his bowel or bladder function.  Prior history of some urinary incontinence that has not changed.   Review of Systems  Constitutional: Negative.   HENT: Negative.    Eyes:  Negative for blurred vision, discharge and redness.  Respiratory: Negative.    Cardiovascular: Negative.   Gastrointestinal:  Negative for abdominal pain.  Genitourinary: Negative.   Musculoskeletal: Negative.  Negative for myalgias.  Skin:  Negative for rash.  Neurological:  Negative for tingling, loss of consciousness and weakness.  Endo/Heme/Allergies:  Negative for polydipsia.       02/07/2023    9:39 AM 12/14/2022    8:54 AM 08/23/2022   10:39 AM  Depression screen PHQ 2/9  Decreased Interest 0 0 0  Down, Depressed, Hopeless 0 1 0  PHQ - 2 Score 0 1 0     Current Outpatient Medications:    acetaminophen (TYLENOL) 500 MG tablet, Take 500 mg by mouth every 6 (six) hours as needed for moderate pain., Disp: , Rfl:    amLODipine (NORVASC) 10 MG tablet, Take 1 tablet (10 mg total) by mouth daily.,  Disp: 90 tablet, Rfl: 3   aspirin EC 81 MG tablet, Take 81 mg by mouth daily., Disp: , Rfl:    Blood Glucose Monitoring Suppl (ONE TOUCH ULTRA 2) w/Device KIT, Check blood glucose 3 times daily as directed, Disp: , Rfl:    carbidopa-levodopa (SINEMET IR) 25-100 MG tablet, Take 1 tablet by mouth 3 (three) times daily. 6am/10-11am/3-4pm, Disp: 270 tablet, Rfl: 1   clotrimazole-betamethasone (LOTRISONE) cream, Apply 1 Application topically 2 (two) times daily as needed (yeast)., Disp: , Rfl:    finasteride (PROSCAR) 5 MG tablet, Take 5 mg by mouth daily., Disp: , Rfl:    Insulin Aspart Prot & Aspart (NOVOLOG MIX 70/30 [place]), Inject 30 Units into the skin 2 (two) times daily., Disp: , Rfl:    JARDIANCE 10 MG TABS tablet, Take 10 mg by mouth daily., Disp: , Rfl:    losartan (COZAAR) 100 MG tablet, Take 100 mg by mouth daily., Disp: , Rfl:    metFORMIN (GLUCOPHAGE) 1000 MG tablet, Take 1 tablet (1,000 mg total) by mouth 2 (two) times daily with a meal., Disp: 60 tablet, Rfl: 0   Multiple Vitamins-Minerals (PRESERVISION AREDS 2 PO), Take 1 capsule by mouth in the morning and at bedtime., Disp: , Rfl:    omeprazole (PRILOSEC) 40 MG capsule, Take 1  capsule (40 mg total) by mouth daily., Disp: 90 capsule, Rfl: 3   ONETOUCH ULTRA test strip, , Disp: , Rfl:    Polyethyl Glyc-Propyl Glyc PF (SYSTANE ULTRA PF) 0.4-0.3 % SOLN, Apply to eye., Disp: , Rfl:    simvastatin (ZOCOR) 40 MG tablet, Take 1 tablet (40 mg total) by mouth at bedtime., Disp: 90 tablet, Rfl: 3   traMADol (ULTRAM) 50 MG tablet, Take 1 tablet (50 mg total) by mouth every 6 (six) hours as needed. (Patient not taking: Reported on 02/07/2023), Disp: 20 tablet, Rfl: 0   Objective:     BP (!) 150/68 (BP Location: Right Arm, Patient Position: Sitting, Cuff Size: Large)   Pulse (!) 40   Temp 98.6 F (37 C) (Temporal)   Ht 5\' 8"  (1.727 m)   Wt 235 lb (106.6 kg)   SpO2 95%   BMI 35.73 kg/m    Physical Exam Constitutional:      General: He is  not in acute distress.    Appearance: Normal appearance. He is not ill-appearing, toxic-appearing or diaphoretic.  HENT:     Head: Normocephalic and atraumatic.     Right Ear: External ear normal.     Left Ear: External ear normal.  Eyes:     General: No scleral icterus.       Right eye: No discharge.        Left eye: No discharge.     Extraocular Movements: Extraocular movements intact.     Conjunctiva/sclera: Conjunctivae normal.  Pulmonary:     Effort: Pulmonary effort is normal. No respiratory distress.  Skin:    General: Skin is warm and dry.  Neurological:     Mental Status: He is alert and oriented to person, place, and time.  Psychiatric:        Mood and Affect: Mood normal.        Behavior: Behavior normal.      No results found for any visits on 02/07/23.    The ASCVD Risk score (Arnett DK, et al., 2019) failed to calculate for the following reasons:   The valid total cholesterol range is 130 to 320 mg/dL    Assessment & Plan:   Sciatica of right side -     Ambulatory referral to Sports Medicine  Dysthymia    Return in about 3 months (around 05/09/2023), or if symptoms worsen or fail to improve.  Patient agrees to a sports medicine referral for follow-up of his sciatica.  Encouraged extension exercises.  Avoid flexion at this time.  Continue Tylenol as needed.  [facility] a [facility] [place] [facility].  Discussed using an SSRI for the sadness he feels over his wife's decline.  He would like to hold off for now.  Libby Maw, MD

## 2023-02-13 ENCOUNTER — Encounter: Payer: Self-pay | Admitting: Family Medicine

## 2023-02-13 ENCOUNTER — Ambulatory Visit: Payer: Medicare Other | Admitting: Family Medicine

## 2023-02-13 VITALS — BP 130/56 | Ht 68.0 in | Wt 231.0 lb

## 2023-02-13 DIAGNOSIS — M5416 Radiculopathy, lumbar region: Secondary | ICD-10-CM | POA: Diagnosis not present

## 2023-02-13 MED ORDER — MELOXICAM 15 MG PO TABS: 15.0000 mg | ORAL_TABLET | Freq: Every day | ORAL | 2 refills | Status: DC

## 2023-02-13 NOTE — Progress Notes (Signed)
PCP: Mliss Sax, MD  Subjective:   HPI: Patient is a 77 y.o. male here for right leg pain.  Patient reports about a month ago he was outside in the yard and did a lot of work - felt like he couldn't straighten up due to pain in lower back. He took tylenol and has used icy hot with some relief. Has felt a cramp sensation in right buttock since then with radiation into his right leg and hamstring and right calf. Pain is a dull ache. Feels like he doesn't have strength in this leg. Leg is giving out especially with using stairs. No bowel/bladder dysfunction.  Past Medical History:  Diagnosis Date   CAP (community acquired pneumonia) 1968   [place]   Cataract    COPD (chronic obstructive pulmonary disease)    Diabetes mellitus    Diverticulosis of colon 11/19/12   left colon, Dr Leone Payor   GERD (gastroesophageal reflux disease)    History of adenomatous polyps of colon - probable attenuated polyposis 07/17/2008   2004: 2 polyps max 8 mm 1 lost and 1 infammatory 2010: 7 polyps TV and tubular adenomas max 6 mm 11/19/2012 :4 polyps; max 1 cm removed; all adenomas 01/05/2017 8 polyps max 10 mm adenomas recall 2021     Hyperlipidemia    Hypertension    Personal history of adenomatous colonic polyps 07/17/2008   2004 2 polyps max 8 mm (adenomas) 2010 7 polyps TV and tubular adenomas max 6 mm 11/19/2012      Current Outpatient Medications on File Prior to Visit  Medication Sig Dispense Refill   acetaminophen (TYLENOL) 500 MG tablet Take 500 mg by mouth every 6 (six) hours as needed for moderate pain.     amLODipine (NORVASC) 10 MG tablet Take 1 tablet (10 mg total) by mouth daily. 90 tablet 3   aspirin EC 81 MG tablet Take 81 mg by mouth daily.     Blood Glucose Monitoring Suppl (ONE TOUCH ULTRA 2) w/Device KIT Check blood glucose 3 times daily as directed     carbidopa-levodopa (SINEMET IR) 25-100 MG tablet Take 1 tablet by mouth 3 (three) times daily.  6am/10-11am/3-4pm 270 tablet 1   clotrimazole-betamethasone (LOTRISONE) cream Apply 1 Application topically 2 (two) times daily as needed (yeast).     finasteride (PROSCAR) 5 MG tablet Take 5 mg by mouth daily.     Insulin Aspart Prot & Aspart (NOVOLOG MIX 70/30 [place]) Inject 30 Units into the skin 2 (two) times daily.     JARDIANCE 10 MG TABS tablet Take 10 mg by mouth daily.     losartan (COZAAR) 100 MG tablet Take 100 mg by mouth daily.     metFORMIN (GLUCOPHAGE) 1000 MG tablet Take 1 tablet (1,000 mg total) by mouth 2 (two) times daily with a meal. 60 tablet 0   Multiple Vitamins-Minerals (PRESERVISION AREDS 2 PO) Take 1 capsule by mouth in the morning and at bedtime.     omeprazole (PRILOSEC) 40 MG capsule Take 1 capsule (40 mg total) by mouth daily. 90 capsule 3   ONETOUCH ULTRA test strip      Polyethyl Glyc-Propyl Glyc PF (SYSTANE ULTRA PF) 0.4-0.3 % SOLN Apply to eye.     simvastatin (ZOCOR) 40 MG tablet Take 1 tablet (40 mg total) by mouth at bedtime. 90 tablet 3   traMADol (ULTRAM) 50 MG tablet Take 1 tablet (50 mg total) by mouth every 6 (six) hours as needed. (Patient not taking: Reported  on 02/07/2023) 20 tablet 0   No current facility-administered medications on file prior to visit.    Past Surgical History:  Procedure Laterality Date   APPENDECTOMY  1959   CATARACT EXTRACTION Right    COLONOSCOPY  multiple   adenomatous polyps, Dr Leone Payor   ESOPHAGOGASTRODUODENOSCOPY  multiple   INGUINAL HERNIA REPAIR Left 1997   INGUINAL HERNIA REPAIR Bilateral 10/24/2022   Procedure: LAPAROSCOPIC BILATERAL INGUINAL HERNIA REPAIR WITH MESH;  Surgeon: Axel Filler, MD;  Location: [facility] OR;  Service: General;  Laterality: Bilateral;   LEFT HEART CATH AND CORONARY ANGIOGRAPHY N/A 05/24/2022   Procedure: LEFT HEART CATH AND CORONARY ANGIOGRAPHY;  Surgeon: Corky Crafts, MD;  Location: [facility] INVASIVE CV LAB;  Service: Cardiovascular;  Laterality: N/A;   TONSILLECTOMY      No Known  Allergies  BP (!) 130/56   Ht 5\' 8"  (1.727 m)   Wt 231 lb (104.8 kg)   BMI 35.12 kg/m       No data to display              No data to display              Objective:  Physical Exam:  Gen: NAD, comfortable in exam room  Back: No gross deformity, scoliosis. TTP right lumbar paraspinal region.  No midline or bony TTP. FROM. Strength 3/5 right hip flexion and 3-/5 right knee extension.  5-/5 right knee flexion, 5/5 dorsiflexion and plantarflexion.  LLE 5/5 all muscle groups.   Trace MSRs in patellar and achilles tendons, equal bilaterally. Positive SLR on right, negative left. Sensation intact to light touch bilaterally.  Right hip: No deformity. FROM with strength as noted above. No tenderness to palpation greater trochanter. NVI distally. Negative logroll Negative faber, fadir, and piriformis stretches.   Assessment & Plan:  1. Right lumbar radiculopathy - concerning with level of weakness proximally of right lower extremity.  Will proceed with MRI lumbar spine.  In meantime meloxicam, physical therapy.  Will consider neurosurgery referral and/or epidural steroid injections depending on MRI results.

## 2023-02-13 NOTE — Patient Instructions (Addendum)
You have lumbar radiculopathy (a pinched nerve in your low back). We will go ahead with an MRI of your lumbar spine given the weakness you have in that right leg. Ok to take tylenol for baseline pain relief (1-2 extra strength tabs 3x/day) Start meloxicam 15mg  daily with food for pain and inflammation.  Don't take aleve or ibuprofen while on this. Would like to try to avoid prednisone dose pack with your diabetes. Stay as active as possible. Physical therapy has been shown to be helpful as well - start this at [facility]. Strengthening of low back muscles, abdominal musculature are key for long term pain relief. Follow up with me after the MRI to go over results.

## 2023-02-20 NOTE — Progress Notes (Unsigned)
Assessment/Plan:   1.  Parkinsons disease, diagnosed September, 2023             -Increase carbidopa/levodopa 25/100, 1.5 tablet 3 times per day at 6am/10-11am/3-4pm  -discussed exercise and info to community programs discussed   2.  Alcohol use             -max 2 times per week   3.  Bilateral ptosis versus pseudoptosis             -Acetylcholine receptor antibodies were negative             -Patient is already seeing both ophthalmology as well as a retina specialist for macular degeneration, and actually just saw them.   4.  Probable diabetic peripheral neuropathy             -Patient sees endocrinology outside of our system.  No labs are available, but patient reports it is well controlled now.  5.  LBP  -seeing sports med, Dr. Pearletha Forge  -awaiting MRI Lumbar spine  Subjective:   Karl Lawson. was seen today in follow up for Parkinsons disease.  My previous records were reviewed prior to todays visit as well as outside records available to me.  Pt with sister in law who supplements the history.   Patient was started on levodopa last visit.  He is tolerating that medication well, without side effects.  Patient was also referred to physical therapy last visit.  Those notes are reviewed.  Pt denies falls.  Pt denies lightheadedness, near syncope.  No hallucinations.  Mood has been good.  He reports he had hernia repair surgery and unilateral cataract sx since last visit.  He is having back pain and is seeing Psychiatrist med (Dr. Pearletha Forge) and is awaiting MRI lumbar spine  Current prescribed movement disorder medications: Carbidopa/levodopa 25/100, 1 tablet 3 times per day (he is taking at 6am/10am/6pm)    ALLERGIES:  No Known Allergies  CURRENT MEDICATIONS:  Current Meds  Medication Sig   acetaminophen (TYLENOL) 500 MG tablet Take 500 mg by mouth every 6 (six) hours as needed for moderate pain.   amLODipine (NORVASC) 10 MG tablet Take 1 tablet (10 mg total) by mouth  daily.   aspirin EC 81 MG tablet Take 81 mg by mouth daily.   Blood Glucose Monitoring Suppl (ONE TOUCH ULTRA 2) w/Device KIT Check blood glucose 3 times daily as directed   carbidopa-levodopa (SINEMET IR) 25-100 MG tablet Take 1 tablet by mouth 3 (three) times daily. 6am/10-11am/3-4pm   clotrimazole-betamethasone (LOTRISONE) cream Apply 1 Application topically 2 (two) times daily as needed (yeast).   finasteride (PROSCAR) 5 MG tablet Take 5 mg by mouth daily.   Insulin Aspart Prot & Aspart (NOVOLOG MIX 70/30 [place]) Inject 30 Units into the skin 2 (two) times daily.   JARDIANCE 10 MG TABS tablet Take 10 mg by mouth daily.   losartan (COZAAR) 100 MG tablet Take 100 mg by mouth daily.   meloxicam (MOBIC) 15 MG tablet Take 1 tablet (15 mg total) by mouth daily.   metFORMIN (GLUCOPHAGE) 1000 MG tablet Take 1 tablet (1,000 mg total) by mouth 2 (two) times daily with a meal.   Multiple Vitamins-Minerals (PRESERVISION AREDS 2 PO) Take 1 capsule by mouth in the morning and at bedtime.   omeprazole (PRILOSEC) 40 MG capsule Take 1 capsule (40 mg total) by mouth daily.   ONETOUCH ULTRA test strip    Polyethyl Glyc-Propyl Glyc PF (SYSTANE ULTRA  PF) 0.4-0.3 % SOLN Apply to eye.   simvastatin (ZOCOR) 40 MG tablet Take 1 tablet (40 mg total) by mouth at bedtime.     Objective:   PHYSICAL EXAMINATION:    VITALS:   Vitals:   02/21/23 1451  BP: 138/72  Pulse: 61  SpO2: 98%  Weight: 230 lb 6.4 oz (104.5 kg)  Height:  (1.727 m)    GEN:  The patient appears stated age and is in NAD. HEENT:  Normocephalic, atraumatic.  The mucous membranes are moist. The superficial temporal arteries are without ropiness or tenderness. CV:  RRR Lungs:  CTAB Neck/HEME:  There are no carotid bruits bilaterally.   Neurological examination:   Orientation: The patient is alert and oriented x3.  Cranial nerves: There is good facial symmetry.  There is bilateral ptosis vs pseudoptosis.  There is hypomimia with lips  parted at times.  The speech is fluent and clear. Soft palate rises symmetrically and there is no tongue deviation. Hearing is intact to conversational tone. Sensation: Sensation is intact to light touch throughout  Motor: Strength is at least antigravity x 4 Deep tendon reflexes: Deep tendon reflexes are 1/4 at the bilateral biceps, triceps, brachioradialis, patella and absent at the bilaterally achilles. Plantar responses are downgoing bilaterally.   Movement examination: Tone: There is mild increased tone in the RUE Abnormal movements: there is rare tremor on the R Coordination:  There is  decremation with RAM's, only with finger taps on the right.  All other rapid alternating movements are normal. Gait and Station: The patient pushes off to arise.  The patient's stride length is just slightly short stepped, but he does not shuffle.  He is holding up his pants so isn't swinging the arms.    The patient has a negative pull test.    I have reviewed and interpreted the following labs independently    Chemistry      Component Value Date/Time   NA 140 10/12/2022 1130   NA 142 05/17/2022 1354   K 3.6 10/12/2022 1130   CL 105 10/12/2022 1130   CO2 27 10/12/2022 1130   BUN 11 10/12/2022 1130   BUN 10 05/17/2022 1354   CREATININE 1.00 10/12/2022 1130      Component Value Date/Time   CALCIUM 9.6 10/12/2022 1130   ALKPHOS 92 12/14/2021 0909   AST 15 12/14/2021 0909   ALT 13 12/14/2021 0909   BILITOT 1.0 12/14/2021 0909       Lab Results  Component Value Date   WBC 9.8 10/12/2022   HGB 14.0 10/12/2022   HCT 41.4 10/12/2022   MCV 80.9 10/12/2022   PLT 241 10/12/2022    Lab Results  Component Value Date   TSH 1.21 07/30/2019     Total time spent on today's visit was 32 minutes, including both face-to-face time and nonface-to-face time.  Time included that spent on review of records (prior notes available to me/labs/imaging if pertinent), discussing treatment and goals,  answering patient's questions and coordinating care.  Cc:  Mliss Sax, MD

## 2023-02-21 ENCOUNTER — Ambulatory Visit: Payer: Medicare Other | Admitting: Neurology

## 2023-02-21 ENCOUNTER — Encounter: Payer: Self-pay | Admitting: Neurology

## 2023-02-21 VITALS — BP 138/72 | HR 61 | Ht 68.0 in | Wt 230.4 lb

## 2023-02-21 DIAGNOSIS — G20A1 Parkinson's disease without dyskinesia, without mention of fluctuations: Secondary | ICD-10-CM | POA: Diagnosis not present

## 2023-02-21 MED ORDER — CARBIDOPA-LEVODOPA 25-100 MG PO TABS: 1.5000 | ORAL_TABLET | Freq: Three times a day (TID) | ORAL | 1 refills | Status: DC

## 2023-02-21 NOTE — Patient Instructions (Signed)
Increase carbidopa/levodopa 25/100, 1.5 tablets at 6am/10-11am/3-4pm    As a reminder, carbidopa/levodopa can be taken at the same time as a carbohydrate, but we like to have you take your pill either 30 minutes before a protein source or 1 hour after as protein can interfere with carbidopa/levodopa absorption.  Local and Online Resources for Power over Parkinson's Group  April 2024   LOCAL [place] PARKINSON'S GROUPS   Power over Parkinson's Group:    Power Over Parkinson's Patient Education Group will be Wednesday, April 10th-*Hybrid meting*- in person at [facility] location and via [facility], 2:00-3:00 pm.   Power over [employer] and Care Partner Groups will meet together, with plans for separate break out session for caregivers, depending on topic/speaker Upcoming Power over Parkinson's Meetings/Care Partner Support:  2nd Wednesdays of the month at 2 pm:   April 10th, May 8th Contact Amy Marriott at [email or web address] if interested in participating in this group    LOCAL EVENTS AND NEW OFFERINGS  NEW:  Parkinson's Social Game Night.  First Thursday of each month, 2:00-4:00 pm.  *Next date is April 4th*.  Rossie Muskrat [employer], [employer].  Contact [email or web address] if interested. Parkinson's CarePartner Group for Men is in the works, if interested email [email or web address] ACT FITNESS Chair Yoga classes "Train and Gain", Fridays 10 am, [employer].  Contact Gina at [phone].  Health visitor Classes offering at [employer]!  TUESDAYS (Chair Yoga)  and Wednesdays (PWR! Moves)  1:00 pm.   Contact Synetta Shadow at  [email or web address]  or [phone]  Drumming for Parkinson's will be held on 2nd and 4th Mondays at 11:00 am.   Located at the [place] of the [employer] ([address] [place]  Contact Albertina Parr at [email or web address] or [phone]   Dance for Parkinson 's classes will be on Tuesdays 10-11 am. Located in the [employer], in the first floor of the [employer] ([address] To register:  [email or web address] or [phone] [facility] Parkinson's Tai Chi Class, Mondays at 11 am.  Call [phone] for details Hamil-Kerr Challenge.  Bike, Run, [employer] for [employer].  Saturday, April 6th at [facility].  To register, visit [email or web address] Moving Day [facility].  Saturday, May 4th, 10 am start.  Register at [email or web address]    ONLINE EDUCATION AND SUPPORT  Parkinson Foundation:  www.parkinson.org  PD Health at Home continues:  Mindfulness Mondays, Wellness Wednesdays, Fitness Fridays  (PWR! Moves as part of Fitness Fridays March 22nd, 1-1:45 pm) Upcoming Education:   Parkinson's 101.  Wednesday, April 3rd, 1-2 pm Movement for Parkinson's.  Wednesday, May 1st, 1-2 pm Expert Briefing:  Research Update:  Working to [employer] PD.  Wednesday, April 10th, 1-2 pm Trouble with Zzz's:  Sleep Challenges with Parkinson's.  Wed, May 8th 1-2 pm Register for virtual education and expert briefings (webinars) at [employer].gl Please check out their website to sign up for emails and see their full online offerings     [employer] Foundation:  www.[email or web address]   Third Thursday Webinars:  On the third Thursday of every month at 12 p.m. ET, join our free live webinars to learn about various aspects of living with Parkinson's disease and our work to speed medical breakthroughs.  Upcoming Webinar:  Let's Talk Taboos:  Hard-to-Discuss Parkinson's Symptoms.  Thursday, April 18th at 12 noon. Check out additional information on their website to see their full online offerings    [employer]  Foundation:  www.davisphinneyfoundation.org  Upcoming Webinar:   Emergent Therapies.  Wednesday, April 2nd, 4 pm Series:  Living with  Parkinson's Meetup.   Third Thursdays each month, 3 pm  Care Partner Monthly Meetup.  With Jillene Bucks [place].  First Tuesday of each month, 2 pm  Check out additional information to Live Well Today on their website    Parkinson and Movement Disorders (PMD) Alliance:  www.pmdalliance.org  NeuroLife Online:  Online Education Events  Sign up for emails, which are sent weekly to give you updates on programming and online offerings    Parkinson's Association of the [place]:  www.parkinsonassociation.org  Information on online support groups, education events, and online exercises including Yoga, Parkinson's exercises and more-LOTS of information on links to PD resources and online events  Virtual Support Group through Parkinson's Association of the [place]; next one is scheduled for Wednesday, April 3rd  MOVEMENT AND EXERCISE OPPORTUNITIES  PWR! Moves Classes at [facility] Exercise Room.  Wednesdays 10 and 11 am.   Contact Amy Marriott, PT [email or web address] if interested.  Parkinson's Exercise Class offerings at [employer]. *TUESDAYS* (Chair yoga) and Wednesdays (PWR! Moves)  1:00 pm.    Contact Synetta Shadow at [email or web address]    Parkinson's Wellness Recovery (PWR! Moves)  www.[email or web address]  Info on the PWR! Virtual Experience:  You will have access to our expertise?through self-assessment, guided plans that start with the PD-specific fundamentals, educational content, tips, Q&A with an expert, and a growing Engineering geologist of PD-specific pre-recorded and live exercise classes of varying types and intensity - both physical and cognitive! If that is not enough, we offer 1:1 wellness consultations (in-person or virtual) to personalize your PWR! Dance movement psychotherapist.   Parkinson [employer] Fridays:   As part of the PD Health @ Home program, this free video series focuses each week on one aspect of fitness designed to support people living with Parkinson's.? These  weekly videos highlight the Parkinson Foundation fitness guidelines for people with Parkinson's disease.  MenusLocal.com.br  Dance for PD website is offering free, live-stream classes throughout the week, as well as links to [employer] of classes:  [email or web address]/  Virtual dance and Pilates for Parkinson's classes: Click on the Community Tab> Parkinson's Movement Initiative Tab.  To register for classes and for more information, visit www.[email or web address].co.za and click the "community" tab.   [employer] Parkinson's Cycling Classes   [employer]:  Thursdays @ Noon-Live classes at [employer] ([employer] at [email or web address]?or [phone])  [facility]: Classes Tuesday, Wednesday and Thursday (contact [place] at [email or web address] ?or [phone])  [facility] [employer]  Varied levels of classes are offered Tuesdays and Thursdays at [employer].   Stretching with Byrd Hesselbach weekly class is also offered for people with Parkinson's  To observe a class or for more information, call [phone] or email Patricia Nettle at [email or web address]   ADDITIONAL SUPPORT AND RESOURCES  [employer]:  Online Caregiver Education Opportunities:  www.well-springsolutions.org/caregiver-education/caregiver-support-group.  You may also contact Loleta Chance at [facility] -spring.[email or web address] or [phone].     [employer] April [employer] Decisions Day:  The Most Critical Legal and Medical Decisions to Consider Now!  Tuesday, April 16th, 1-3 pm at [facility] at [facility].  Contact Loleta Chance at [facility] -spring.org or [phone] Powerful Tools for Caregivers.  6 week educational series for caregivers.  April 18-May 23, 10:30 am-12:15 pm at [facility] 3rd Floor Conference Room.   Contact Loleta Chance at  [facility] -spring.org or [phone] to register Well-Spring Navigator:  Just1Navigator  program, a?free service to help individuals and families through the journey of determining care for older adults.  The "Navigator" is a Child psychotherapist, Sidney Ace, who will speak with a prospective client and/or loved ones to provide an assessment of the situation and a set of recommendations for a personalized care plan -- all free of charge, and whether?[employer] offers the needed service or not. If the need is not a service we provide, we are well-connected with reputable programs in town that we can refer you to.  www.well-springsolutions.org or to speak with the Navigator, call [phone].

## 2023-02-27 ENCOUNTER — Ambulatory Visit
Admission: RE | Admit: 2023-02-27 | Discharge: 2023-02-27 | Disposition: A | Discharge status: Home / Self Care | Payer: Medicare Other | Source: Ambulatory Visit | Attending: Family Medicine | Admitting: Family Medicine

## 2023-02-27 DIAGNOSIS — M5416 Radiculopathy, lumbar region: Secondary | ICD-10-CM

## 2023-03-10 ENCOUNTER — Ambulatory Visit: Payer: Self-pay

## 2023-03-10 NOTE — Patient Outreach (Signed)
  Care Coordination   Follow Up Visit Note   03/10/2023 Name: Karl Lawson. MRN: [id number] DOB: 12-01-1945  Karl Lawson. is a 77 y.o. year old male who sees Mliss Sax, MD for primary care. I spoke with  Karl Lawson. by phone today.  What matters to the patients health and wellness today?  Low back pain.  Seeing sports medicine and waiting next steps.     Goals Addressed             This Visit's Progress    Diabetes and Parkinson's Management       Care Coordination Interventions: Evaluation of current treatment plan related to Parkinson's and patient's adherence to plan as established by provider Discussed plans with patient for ongoing care management follow up and provided patient with direct contact information for care management team Provided education to patient about basic DM disease process Reviewed medications with patient and discussed importance of medication adherence Managing diabetes with sugars mostly running around 100 or less.   Patient on medications for parkinson's tremors.  Sees [facility].  No increase in tremors Blood sugar this morning 55. Patient reports eating right away.    Wife remains in rehab due to dementia.          SDOH assessments and interventions completed:  Yes  SDOH Interventions Today    Flowsheet Row Most Recent Value  SDOH Interventions   Housing Interventions Intervention Not Indicated  Transportation Interventions Intervention Not Indicated        Care Coordination Interventions:  Yes, provided   Follow up plan: Follow up call scheduled for July    Encounter Outcome:  Pt. Visit Completed   Bary Leriche, RN, MSN [facility] Care Management Care Management Coordinator Direct Line [phone]

## 2023-03-10 NOTE — Patient Instructions (Signed)
Visit Information  Thank you for taking time to visit with me today. Please don't hesitate to contact me if I can be of assistance to you.   Following are the goals we discussed today:   Goals Addressed             This Visit's Progress    Diabetes and Parkinson's Management       Care Coordination Interventions: Evaluation of current treatment plan related to Parkinson's and patient's adherence to plan as established by provider Discussed plans with patient for ongoing care management follow up and provided patient with direct contact information for care management team Provided education to patient about basic DM disease process Reviewed medications with patient and discussed importance of medication adherence Managing diabetes with sugars mostly running around 100 or less.   Patient on medications for parkinson's tremors.  Sees  Neurology.  No increase in tremors Blood sugar this morning 55. Patient reports eating right away.    Wife remains in rehab due to dementia.          Our next appointment is by telephone on 05/19/23 at 0930  Please call the care guide team at [phone] if you need to cancel or reschedule your appointment.   If you are experiencing a Mental Health or Behavioral Health Crisis or need someone to talk to, please call the Suicide and Crisis Lifeline: 988   The patient verbalized understanding of instructions, educational materials, and care plan provided today and DECLINED offer to receive copy of patient instructions, educational materials, and care plan.   The patient has been provided with contact information for the care management team and has been advised to call with any health related questions or concerns.   Bary Leriche, RN, MSN [facility] Care Management Care Management Coordinator Direct Line [phone]

## 2023-03-14 ENCOUNTER — Telehealth: Payer: Self-pay | Admitting: Family Medicine

## 2023-03-14 NOTE — Telephone Encounter (Signed)
Contacted [name]. to schedule their annual wellness visit. Patient declined to schedule AWV at this time.  Insurance coming to home  Rudell Cobb AWV direct phone # [phone]   Spoke with patient to schedule awv  he stated insurance coming to home 5/9 Will try to call next year to get an appt w/[facility]

## 2023-03-15 ENCOUNTER — Other Ambulatory Visit: Payer: Self-pay | Admitting: *Deleted

## 2023-03-15 NOTE — Progress Notes (Signed)
[facility] Julio Sicks [address] [place] 03/22/23 @ 130P [phone]

## 2023-03-22 ENCOUNTER — Telehealth: Payer: Self-pay | Admitting: *Deleted

## 2023-03-22 NOTE — Telephone Encounter (Signed)
   Pre-operative Risk Assessment    Patient Name: Karl Lawson.  DOB: 1945-12-03 MRN: [id number]      Request for Surgical Clearance    Procedure:   L4-5Lumbar Microdiskectomy  Date of Surgery:  Clearance TBD                                 Surgeon:  Dr. Julio Sicks Surgeon's Group or Practice Name:  [facility] Phone number:  [phone] Fax number:  [phone]   Type of Clearance Requested:   - Medical  - Pharmacy:  Hold Aspirin Not Indicated   Type of Anesthesia:  General    Additional requests/questions:    Signed, Emmit Pomfret   03/22/2023, 3:34 PM

## 2023-03-23 NOTE — Telephone Encounter (Signed)
Left message for the patient to contact the office. 

## 2023-03-23 NOTE — Telephone Encounter (Signed)
Primary Cardiologist:Heather Becky Sax, MD   Preoperative team, please contact this patient and set up a phone call appointment for further preoperative risk assessment. Please obtain consent and complete medication review. Thank you for your help.   Per office protocol, he may hold aspirin for 5-7 days prior to procedure and should resume as soon as hemodynamically stable postoperatively.   [name], NP-C  03/23/2023, 11:10 AM [address], [address] Office [phone] Fax [phone]

## 2023-03-24 NOTE — Telephone Encounter (Signed)
2nd attempt to call back and schedule a tele pre op appt.

## 2023-03-28 NOTE — Telephone Encounter (Addendum)
3rd attempt: I left a message for the patient to call our office back to schedule a tele visit for pre-op clearance. I will send a letter to patient to let them know we have been trying to reach them regarding their pre-op clearance. I will also fax the requesting provider's office to let know the status of the clearance.

## 2023-03-28 NOTE — Telephone Encounter (Signed)
Patient called our office back and states that he hasn;t got a scheduled date for his surgery and doesn't know whether or not he will proceed forward with it. Patient says that he will call are office if he decide to do the procedure to schedule a tele visit.

## 2023-03-31 ENCOUNTER — Other Ambulatory Visit: Payer: Self-pay

## 2023-03-31 MED ORDER — AMLODIPINE BESYLATE 10 MG PO TABS: 10.0000 mg | ORAL_TABLET | Freq: Every day | ORAL | 1 refills | Status: DC

## 2023-04-28 ENCOUNTER — Encounter (HOSPITAL_COMMUNITY): Payer: Self-pay

## 2023-04-28 ENCOUNTER — Ambulatory Visit (HOSPITAL_COMMUNITY)
Admission: EM | Admit: 2023-04-28 | Discharge: 2023-04-28 | Disposition: A | Discharge status: Home / Self Care | Payer: Medicare Other | Attending: Internal Medicine | Admitting: Internal Medicine

## 2023-04-28 DIAGNOSIS — H6123 Impacted cerumen, bilateral: Secondary | ICD-10-CM

## 2023-04-28 HISTORY — DX: Low back pain, unspecified: M54.50

## 2023-04-28 NOTE — ED Provider Notes (Signed)
[facility]    CSN: [id number] Arrival date & time: 04/28/23  1115      History   Chief Complaint Chief Complaint  Patient presents with   Ear Fullness    HPI [name]. is a 77 y.o. male who presents with L ear fullness x 10 days. Denies pain.     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)   Cataract    COPD (chronic obstructive pulmonary disease) (HCC)    Diabetes mellitus    Diverticulosis of colon 11/19/2012   left colon, Dr Leone Payor   GERD (gastroesophageal reflux disease)    History of adenomatous polyps of colon - probable attenuated polyposis 07/17/2008   2004: 2 polyps max 8 mm 1 lost and 1 infammatory 2010: 7 polyps TV and tubular adenomas max 6 mm 11/19/2012 :4 polyps; max 1 cm removed; all adenomas 01/05/2017 8 polyps max 10 mm adenomas recall 2021     Hyperlipidemia    Hypertension    Lumbago    buldging disc L4-5   Personal history of adenomatous colonic polyps 07/17/2008   2004 2 polyps max 8 mm (adenomas) 2010 7 polyps TV and tubular adenomas max 6 mm 11/19/2012      Patient Active Problem List   Diagnosis Date Noted   Dysthymia 02/07/2023   Sciatica of right side 02/07/2023   Parkinson's disease 07/27/2022   Abnormal nuclear stress test    Hyperglycemia due to type 2 diabetes mellitus (HCC) 02/07/2022   Long term (current) use of insulin (HCC) 02/07/2022   Obesity 02/07/2022   Lower respiratory infection 02/07/2022   Reactive airway disease 02/07/2022   Unilateral inguinal hernia without obstruction or gangrene 12/13/2021   Medication side effect 01/25/2021   Balanitis 01/25/2021   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, bilateral (HCC) 07/29/2019   COPD (chronic obstructive pulmonary disease) (HCC) 12/15/2016   Skin tag 12/15/2016   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   Adjustment disorder with mixed  anxiety and depressed mood 04/02/2013   ABNORMAL ELECTROCARDIOGRAM 10/22/2010   OTHER HEART BLOCK 10/14/2010   GERD 05/22/2009   Insulin dependent diabetes mellitus with complications 07/17/2008   Mixed hyperlipidemia 07/17/2008   Essential hypertension 07/17/2008   History of adenomatous polyps of colon - probable attenuated polyposis 07/17/2008   ELEVATED PROSTATE SPECIFIC ANTIGEN 07/06/2007    Past Surgical History:  Procedure Laterality Date   APPENDECTOMY  1959   CATARACT EXTRACTION Right    COLONOSCOPY  multiple   adenomatous polyps, Dr Leone Payor   ESOPHAGOGASTRODUODENOSCOPY  multiple   INGUINAL HERNIA REPAIR Left 1997   INGUINAL HERNIA REPAIR Bilateral 10/24/2022   Procedure: LAPAROSCOPIC BILATERAL INGUINAL HERNIA REPAIR WITH MESH;  Surgeon: Axel Filler, MD;  [facility];  Service: General;  Laterality: Bilateral;   LEFT HEART CATH AND CORONARY ANGIOGRAPHY N/A 05/24/2022   Procedure: LEFT HEART CATH AND CORONARY ANGIOGRAPHY;  Surgeon: Corky Crafts, MD;  Location: [facility] INVASIVE CV LAB;  Service: Cardiovascular;  Laterality: N/A;   TONSILLECTOMY         Home Medications    Prior to Admission medications   Medication Sig Start Date End Date Taking? Authorizing Provider  amLODipine (NORVASC) 10 MG tablet Take 1 tablet (10 mg total) by mouth daily. 03/31/23  Yes Meriam Sprague, MD  aspirin EC 81 MG tablet Take 81 mg by mouth daily.  Yes [provider]  carbidopa-levodopa (SINEMET IR) 25-100 MG tablet Take 1.5 tablets by mouth 3 (three) times daily. 6am/10-11am/3-4pm 02/21/23  Yes Tat, Octaviano Batty, DO  clotrimazole-betamethasone (LOTRISONE) cream Apply 1 Application topically 2 (two) times daily as needed (yeast). 09/08/22  Yes [provider]  finasteride (PROSCAR) 5 MG tablet Take 5 mg by mouth daily.   Yes [provider]  Insulin Aspart Prot & Aspart (NOVOLOG MIX 70/30 [place]) Inject 30 Units into the skin 2 (two) times daily.   Yes  [provider]  JARDIANCE 10 MG TABS tablet Take 10 mg by mouth daily. 04/03/20  Yes [provider]  losartan (COZAAR) 100 MG tablet Take 100 mg by mouth daily.   Yes [provider]  meloxicam (MOBIC) 15 MG tablet Take 1 tablet (15 mg total) by mouth daily. 02/13/23  Yes Hudnall, Azucena Fallen, MD  metFORMIN (GLUCOPHAGE) 1000 MG tablet Take 1 tablet (1,000 mg total) by mouth 2 (two) times daily with a meal. 05/26/22  Yes Corky Crafts, MD  Multiple Vitamins-Minerals (PRESERVISION AREDS 2 PO) Take 1 capsule by mouth in the morning and at bedtime.   Yes [provider]  omeprazole (PRILOSEC) 40 MG capsule Take 1 capsule (40 mg total) by mouth daily. 05/06/20  Yes Arnaldo Natal, NP  simvastatin (ZOCOR) 40 MG tablet Take 1 tablet (40 mg total) by mouth at bedtime. 07/30/19  Yes Dianne Dun, MD  acetaminophen (TYLENOL) 500 MG tablet Take 500 mg by mouth every 6 (six) hours as needed for moderate pain.    [provider]  Blood Glucose Monitoring Suppl (ONE TOUCH ULTRA 2) w/Device KIT Check blood glucose 3 times daily as directed    [provider]  ONETOUCH ULTRA test strip  08/11/20   [provider]    Family History Family History  Problem Relation Age of Onset   Alzheimer's disease Mother    Heart disease Father        CHF   Stroke Father 5   Tremor Father    Tremor Sister    Pulmonary embolism Brother    Diabetes Brother    Heart disease Paternal Grandfather    Asthma Neg Hx    Colon cancer Neg Hx    Esophageal cancer Neg Hx    Pancreatic cancer Neg Hx    Prostate cancer Neg Hx    Rectal cancer Neg Hx    Stomach cancer Neg Hx     Social History Social History   Tobacco Use   Smoking status: Former    Types: Cigarettes, E-cigarettes    Quit date: 10/31/2012    Years since quitting: 10.4   Smokeless tobacco: Never   Tobacco comments:    smoked ages [age], up to 1 ppd . Smoked 2-3 cigars/ day [age]   Vaping Use   Vaping Use: Some days  Substance Use Topics   Alcohol use: Yes    Comment: 1-2 per week   Drug use: No     Allergies   Patient has no known allergies.   Review of Systems Review of Systems As noted in HPI  Physical Exam Triage Vital Signs ED Triage Vitals  Enc Vitals Group     BP 04/28/23 1141 (!) 150/59     Pulse Rate 04/28/23 1141 (!) 44     Resp 04/28/23 1141 16     Temp 04/28/23 1141 97.8 F (36.6 C)     Temp Source 04/28/23 1141 Oral  SpO2 04/28/23 1141 94 %     Weight 04/28/23 1140 234 lb (106.1 kg)     Height 04/28/23 1140 5' 8.5" (1.74 m)     Head Circumference --      Peak Flow --      Pain Score 04/28/23 1140 0     Pain Loc --      Pain Edu? --      Excl. in GC? --    No data found.  Updated Vital Signs BP (!) 150/59 (BP Location: Left Arm)   Pulse (!) 44   Temp 97.8 F (36.6 C) (Oral)   Resp 16   Ht 5' 8.5" (1.74 m)   Wt 234 lb (106.1 kg)   SpO2 94%   BMI 35.06 kg/m   Visual Acuity Right Eye Distance:   Left Eye Distance:   Bilateral Distance:    Right Eye Near:   Left Eye Near:    Bilateral Near:     Physical Exam Vitals and nursing note reviewed.  Constitutional:      General: He is not in acute distress.    Appearance: He is not toxic-appearing.  HENT:     Right Ear: External ear normal. There is impacted cerumen.     Left Ear: External ear normal. There is impacted cerumen.     Ears:     Comments: After lavage both TM;s are healthy Eyes:     General: No scleral icterus.    Conjunctiva/sclera: Conjunctivae normal.  Pulmonary:     Effort: Pulmonary effort is normal.  Musculoskeletal:     Cervical back: Neck supple.  Skin:    General: Skin is warm and dry.  Neurological:     Mental Status: He is alert and oriented to person, place, and time.     Gait: Gait normal.  Psychiatric:        Mood and Affect: Mood normal.        Behavior: Behavior normal.        Thought Content: Thought content normal.       UC Treatments / Results  Labs (all labs ordered are listed, but only abnormal results are displayed) Labs Reviewed - No data to display  EKG   Radiology No results found.  Procedures Procedures (including critical care time)  Medications Ordered in UC Medications - No data to display  Initial Impression / Assessment and Plan / UC Course  I have reviewed the triage vital signs and the nursing notes. Bilateral ear lavage done without complications Will FU prn.  Final Clinical Impressions(s) / UC Diagnoses   Final diagnoses:  Bilateral impacted cerumen   Discharge Instructions   None    ED Prescriptions   None    PDMP not reviewed this encounter.   Karl Ham, PA-C 04/28/23 1253

## 2023-04-28 NOTE — ED Triage Notes (Signed)
Patient here today with c/o left ear fullness X 10 days. He is having decreased hearing. No pain.

## 2023-05-10 ENCOUNTER — Other Ambulatory Visit: Payer: Self-pay | Admitting: Family Medicine

## 2023-05-10 ENCOUNTER — Ambulatory Visit (INDEPENDENT_AMBULATORY_CARE_PROVIDER_SITE_OTHER): Payer: Medicare Other | Admitting: Family Medicine

## 2023-05-10 ENCOUNTER — Encounter: Payer: Self-pay | Admitting: Family Medicine

## 2023-05-10 VITALS — BP 150/72 | HR 54 | Temp 96.7°F | Ht 68.0 in | Wt 226.2 lb

## 2023-05-10 DIAGNOSIS — N400 Enlarged prostate without lower urinary tract symptoms: Secondary | ICD-10-CM

## 2023-05-10 DIAGNOSIS — I10 Essential (primary) hypertension: Secondary | ICD-10-CM | POA: Diagnosis not present

## 2023-05-10 DIAGNOSIS — K2100 Gastro-esophageal reflux disease with esophagitis, without bleeding: Secondary | ICD-10-CM

## 2023-05-10 DIAGNOSIS — E782 Mixed hyperlipidemia: Secondary | ICD-10-CM

## 2023-05-10 LAB — BASIC METABOLIC PANEL
BUN: 21 mg/dL (ref 6–23)
CO2: 29 mEq/L (ref 19–32)
Calcium: 10.5 mg/dL (ref 8.4–10.5)
Chloride: 105 mEq/L (ref 96–112)
Creatinine, Ser: 0.95 mg/dL (ref 0.40–1.50)
GFR: 77.15 mL/min (ref >60.00)
Glucose, Bld: 124 mg/dL — ABNORMAL HIGH (ref 70–99)
Potassium: 4.3 mEq/L (ref 3.5–5.1)
Sodium: 142 mEq/L (ref 135–145)

## 2023-05-10 LAB — PSA: PSA: 2.57 ng/mL (ref 0.10–4.00)

## 2023-05-10 MED ORDER — OMEPRAZOLE 40 MG PO CPDR
40.0000 mg | DELAYED_RELEASE_CAPSULE | Freq: Every day | ORAL | 3 refills | Status: AC
Start: 2023-05-10 — End: ?

## 2023-05-10 MED ORDER — FINASTERIDE 5 MG PO TABS
5.0000 mg | ORAL_TABLET | Freq: Every day | ORAL | 3 refills | Status: AC
Start: 2023-05-10 — End: ?

## 2023-05-10 NOTE — Progress Notes (Signed)
Established Patient Office Visit   Subjective:  Patient ID: Karl Teutsch., male    DOB: 01/03/1946  Age: 77 y.o. MRN: [id number]  Chief Complaint  Patient presents with   Medical Management of Chronic Issues    3 month follow up. Pt states he is doing okay. Now in PT for Bulging disk that was causing his right side sciatic pain.     HPI Encounter Diagnoses  Name Primary?   Essential hypertension Yes   Mixed hyperlipidemia    Gastroesophageal reflux disease with esophagitis, unspecified whether hemorrhage    Benign prostatic hyperplasia without lower urinary tract symptoms    For follow-up of above.  Forgot to take his blood pressure medicines this morning.  He does not experience shortness of breath chest pain.  He does experience severe reflux when he does not take his omeprazole.  He would like for me to follow his BPH at this point.  Proscar has helped a urine flow a great deal.  He would like to continue it.  Cholesterol is well-controlled with simvastatin.  Has been able to lose some weight.   Review of Systems  Constitutional: Negative.   HENT: Negative.    Eyes:  Negative for blurred vision, discharge and redness.  Respiratory: Negative.    Cardiovascular: Negative.   Gastrointestinal:  Negative for abdominal pain.  Genitourinary: Negative.   Musculoskeletal: Negative.  Negative for myalgias.  Skin:  Negative for rash.  Neurological:  Negative for tingling, loss of consciousness and weakness.  Endo/Heme/Allergies:  Negative for polydipsia.     Current Outpatient Medications:    acetaminophen (TYLENOL) 500 MG tablet, Take 500 mg by mouth every 6 (six) hours as needed for moderate pain., Disp: , Rfl:    amLODipine (NORVASC) 10 MG tablet, Take 1 tablet (10 mg total) by mouth daily., Disp: 90 tablet, Rfl: 1   aspirin EC 81 MG tablet, Take 81 mg by mouth daily., Disp: , Rfl:    Blood Glucose Monitoring Suppl (ONE TOUCH ULTRA 2) w/Device KIT, Check blood glucose 3  times daily as directed, Disp: , Rfl:    carbidopa-levodopa (SINEMET IR) 25-100 MG tablet, Take 1.5 tablets by mouth 3 (three) times daily. 6am/10-11am/3-4pm, Disp: 405 tablet, Rfl: 1   clotrimazole-betamethasone (LOTRISONE) cream, Apply 1 Application topically 2 (two) times daily as needed (yeast)., Disp: , Rfl:    Insulin Aspart Prot & Aspart (NOVOLOG MIX 70/30 [place]), Inject 30 Units into the skin 2 (two) times daily., Disp: , Rfl:    JARDIANCE 10 MG TABS tablet, Take 10 mg by mouth daily., Disp: , Rfl:    losartan (COZAAR) 100 MG tablet, Take 100 mg by mouth daily., Disp: , Rfl:    meloxicam (MOBIC) 15 MG tablet, Take 1 tablet (15 mg total) by mouth daily., Disp: 30 tablet, Rfl: 2   metFORMIN (GLUCOPHAGE) 1000 MG tablet, Take 1 tablet (1,000 mg total) by mouth 2 (two) times daily with a meal., Disp: 60 tablet, Rfl: 0   Multiple Vitamins-Minerals (PRESERVISION AREDS 2 PO), Take 1 capsule by mouth in the morning and at bedtime., Disp: , Rfl:    ONETOUCH ULTRA test strip, , Disp: , Rfl:    simvastatin (ZOCOR) 40 MG tablet, Take 1 tablet (40 mg total) by mouth at bedtime., Disp: 90 tablet, Rfl: 3   finasteride (PROSCAR) 5 MG tablet, Take 1 tablet (5 mg total) by mouth daily., Disp: 90 tablet, Rfl: 3   omeprazole (PRILOSEC) 40 MG capsule, Take 1 capsule (  40 mg total) by mouth daily., Disp: 90 capsule, Rfl: 3   Objective:     BP (!) 150/72   Pulse (!) 54   Temp (!) 96.7 F (35.9 C)   Ht 5\' 8"  (1.727 m)   Wt 226 lb 3.2 oz (102.6 kg)   SpO2 97%   BMI 34.39 kg/m  BP Readings from Last 3 Encounters:  05/10/23 (!) 150/72  04/28/23 (!) 150/59  02/21/23 138/72   Wt Readings from Last 3 Encounters:  05/10/23 226 lb 3.2 oz (102.6 kg)  04/28/23 234 lb (106.1 kg)  02/21/23 230 lb 6.4 oz (104.5 kg)      Physical Exam Constitutional:      General: He is not in acute distress.    Appearance: Normal appearance. He is not ill-appearing, toxic-appearing or diaphoretic.  HENT:     Head:  Normocephalic and atraumatic.     Right Ear: External ear normal.     Left Ear: External ear normal.     Mouth/Throat:     Mouth: Mucous membranes are moist.     Pharynx: Oropharynx is clear. No oropharyngeal exudate or posterior oropharyngeal erythema.  Eyes:     General: No scleral icterus.       Right eye: No discharge.        Left eye: No discharge.     Extraocular Movements: Extraocular movements intact.     Conjunctiva/sclera: Conjunctivae normal.     Pupils: Pupils are equal, round, and reactive to light.  Cardiovascular:     Rate and Rhythm: Normal rate and regular rhythm.  Pulmonary:     Effort: Pulmonary effort is normal. No respiratory distress.     Breath sounds: Normal breath sounds.  Abdominal:     General: Bowel sounds are normal.  Musculoskeletal:     Cervical back: No rigidity or tenderness.  Skin:    General: Skin is warm and dry.  Neurological:     Mental Status: He is alert and oriented to person, place, and time.  Psychiatric:        Mood and Affect: Mood normal.        Behavior: Behavior normal.      No results found for any visits on 05/10/23.    The ASCVD Risk score (Arnett DK, et al., 2019) failed to calculate for the following reasons:   The valid total cholesterol range is 130 to 320 mg/dL    Assessment & Plan:   Essential hypertension -     Basic metabolic panel  Mixed hyperlipidemia  Gastroesophageal reflux disease with esophagitis, unspecified whether hemorrhage -     Omeprazole; Take 1 capsule (40 mg total) by mouth daily.  Dispense: 90 capsule; Refill: 3  Benign prostatic hyperplasia without lower urinary tract symptoms -     Finasteride; Take 1 tablet (5 mg total) by mouth daily.  Dispense: 90 tablet; Refill: 3 -     PSA    Return in about 8 weeks (around 07/05/2023), or Please take blood pressure medicines daily.  Follow-up in 8 weeks for recheck.Mliss Sax, MD

## 2023-05-19 ENCOUNTER — Ambulatory Visit: Payer: Self-pay

## 2023-05-19 NOTE — Patient Instructions (Signed)
Visit Information  Thank you for taking time to visit with me today. Please don't hesitate to contact me if I can be of assistance to you.   Following are the goals we discussed today:   Goals Addressed             This Visit's Progress    Diabetes and Parkinson's Management       Care Coordination Interventions: Evaluation of current treatment plan related to Parkinson's and patient's adherence to plan as established by provider Discussed plans with patient for ongoing care management follow up and provided patient with direct contact information for care management team Provided education to patient about basic DM disease process Reviewed medications with patient and discussed importance of medication adherence Managing diabetes with sugars mostly running around 90-100.   Patient on medications for parkinson's tremors.  No increase in tremors Wife remains in rehab due to dementia.          Our next appointment is by telephone on 07/21/23 at 930 am  Please call the care guide team at [phone] if you need to cancel or reschedule your appointment.   If you are experiencing a Mental Health or Behavioral Health Crisis or need someone to talk to, please call the Suicide and Crisis Lifeline: 988   Patient verbalizes understanding of instructions and care plan provided today and agrees to view in MyChart. Active MyChart status and patient understanding of how to access instructions and care plan via MyChart confirmed with patient.     The patient has been provided with contact information for the care management team and has been advised to call with any health related questions or concerns.   Bary Leriche, RN, MSN [facility] Care Management Care Management Coordinator Direct Line [phone]

## 2023-05-19 NOTE — Patient Outreach (Signed)
  Care Coordination   Follow Up Visit Note   05/19/2023 Name: Karl Lawson. MRN: [id number] DOB: [birth year]  Karl Lawson. is a 77 y.o. year old male who sees Mliss Sax, MD for primary care. I spoke with  Karl Lawson. by phone today. Patient doing okay seeing chiropractor for back pain.  What matters to the patients health and wellness today?  Managing health    Goals Addressed             This Visit's Progress    Diabetes and Parkinson's Management       Care Coordination Interventions: Evaluation of current treatment plan related to Parkinson's and patient's adherence to plan as established by provider Discussed plans with patient for ongoing care management follow up and provided patient with direct contact information for care management team Provided education to patient about basic DM disease process Reviewed medications with patient and discussed importance of medication adherence Managing diabetes with sugars mostly running around 90-100.   Patient on medications for parkinson's tremors.  No increase in tremors Wife remains in rehab due to dementia.          SDOH assessments and interventions completed:  Yes     Care Coordination Interventions:  Yes, provided   Follow up plan: Follow up call scheduled for September    Encounter Outcome:  Pt. Visit Completed   Bary Leriche, RN, MSN [facility] Care Management Care Management Coordinator Direct Line [phone]

## 2023-05-22 ENCOUNTER — Ambulatory Visit (HOSPITAL_COMMUNITY)
Admission: EM | Admit: 2023-05-22 | Discharge: 2023-05-22 | Disposition: A | Discharge status: Home / Self Care | Payer: Medicare Other | Attending: Internal Medicine | Admitting: Internal Medicine

## 2023-05-22 ENCOUNTER — Encounter (HOSPITAL_COMMUNITY): Payer: Self-pay

## 2023-05-22 DIAGNOSIS — I10 Essential (primary) hypertension: Secondary | ICD-10-CM

## 2023-05-22 DIAGNOSIS — E11622 Type 2 diabetes mellitus with other skin ulcer: Secondary | ICD-10-CM | POA: Diagnosis not present

## 2023-05-22 DIAGNOSIS — Z794 Long term (current) use of insulin: Secondary | ICD-10-CM | POA: Diagnosis not present

## 2023-05-22 DIAGNOSIS — L89892 Pressure ulcer of other site, stage 2: Secondary | ICD-10-CM

## 2023-05-22 DIAGNOSIS — L03114 Cellulitis of left upper limb: Secondary | ICD-10-CM | POA: Diagnosis not present

## 2023-05-22 MED ORDER — DOXYCYCLINE HYCLATE 100 MG PO CAPS: 100.0000 mg | ORAL_CAPSULE | Freq: Two times a day (BID) | ORAL | 0 refills | Status: DC

## 2023-05-22 MED ORDER — MUPIROCIN 2 % EX OINT: 1.0000 | TOPICAL_OINTMENT | Freq: Two times a day (BID) | CUTANEOUS | 0 refills | Status: DC

## 2023-05-22 NOTE — Discharge Instructions (Addendum)
Your pressure ulcer is infected to your left elbow.  Take doxycycline antibiotic twice a day for the next 10 days to treat infection. Avoid placing pressure to the area. Warm compresses will help with infection.  Schedule a follow-up appointment with PCP in the next 5-7 days for re-check.  If you develop any new or worsening symptoms or if your symptoms do not start to improve, pleases return here or follow-up with your primary care provider. If your symptoms are severe, please go to the emergency room.

## 2023-05-22 NOTE — ED Triage Notes (Signed)
Pt has a wound to lt elbow/arm for over 2wks. States saw his PCP and it was ok. States now having redness and swelling.

## 2023-05-22 NOTE — ED Provider Notes (Addendum)
[facility]    CSN: [id number] Arrival date & time: 05/22/23  1208      History   Chief Complaint Chief Complaint  Patient presents with   Wound Check    HPI Karl Lawson. is a 77 y.o. male.   Patient presents to urgent care for evaluation of wound to the left elbow that initially started 2 weeks ago.  States he frequently rests his left elbow/posterior forearm on his table while he is using his computer and this is caused a pressure sore to the area.  When he saw his PCP a couple of weeks ago when symptoms first started, he states the lesion was much smaller, none tender, and was just a little bit swollen.  Over the last 2 weeks, the wound has become red, hot to touch, swollen, raised, and the redness has spread to the distal forearm.  He is a type II diabetic on insulin.  No recent antibiotics or steroid use.  No numbness or tingling distally to injury.  No recent other trauma/injury to the left elbow.  No allergies to antibiotics.  Tolerating food and fluids well without nausea or vomiting.  No recent fevers or chills.  Has not attempted use of any over-the-counter medications to help with symptoms.     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)   Cataract    COPD (chronic obstructive pulmonary disease) (HCC)    Diabetes mellitus    Diverticulosis of colon 11/19/2012   left colon, Dr Leone Payor   GERD (gastroesophageal reflux disease)    History of adenomatous polyps of colon - probable attenuated polyposis 07/17/2008   2004: 2 polyps max 8 mm 1 lost and 1 infammatory 2010: 7 polyps TV and tubular adenomas max 6 mm 11/19/2012 :4 polyps; max 1 cm removed; all adenomas 01/05/2017 8 polyps max 10 mm adenomas recall 2021     Hyperlipidemia    Hypertension    Lumbago    buldging disc L4-5   Personal history of adenomatous colonic polyps 07/17/2008   2004 2 polyps max 8 mm (adenomas) 2010 7 polyps TV and  tubular adenomas max 6 mm 11/19/2012      Patient Active Problem List   Diagnosis Date Noted   Dysthymia 02/07/2023   Sciatica of right side 02/07/2023   Parkinson's disease 07/27/2022   Abnormal nuclear stress test    Hyperglycemia due to type 2 diabetes mellitus (HCC) 02/07/2022   Long term (current) use of insulin (HCC) 02/07/2022   Obesity 02/07/2022   Lower respiratory infection 02/07/2022   Reactive airway disease 02/07/2022   Unilateral inguinal hernia without obstruction or gangrene 12/13/2021   Medication side effect 01/25/2021   Balanitis 01/25/2021   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, bilateral (HCC) 07/29/2019   COPD (chronic obstructive pulmonary disease) (HCC) 12/15/2016   Skin tag 12/15/2016   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   ABNORMAL ELECTROCARDIOGRAM 10/22/2010   OTHER HEART BLOCK 10/14/2010   GERD 05/22/2009   Insulin dependent diabetes mellitus with complications 07/17/2008   Mixed hyperlipidemia 07/17/2008   Essential hypertension 07/17/2008   History of adenomatous polyps of colon - probable attenuated polyposis 07/17/2008   ELEVATED PROSTATE SPECIFIC ANTIGEN 07/06/2007    Past Surgical History:  Procedure Laterality Date   APPENDECTOMY  1959   CATARACT EXTRACTION Right    COLONOSCOPY  multiple  adenomatous polyps, Dr Leone Payor   ESOPHAGOGASTRODUODENOSCOPY  multiple   INGUINAL HERNIA REPAIR Left 1997   INGUINAL HERNIA REPAIR Bilateral 10/24/2022   Procedure: LAPAROSCOPIC BILATERAL INGUINAL HERNIA REPAIR WITH MESH;  Surgeon: Axel Filler, MD;  Location: [facility] OR;  Service: General;  Laterality: Bilateral;   LEFT HEART CATH AND CORONARY ANGIOGRAPHY N/A 05/24/2022   Procedure: LEFT HEART CATH AND CORONARY ANGIOGRAPHY;  Surgeon: Corky Crafts, MD;  Location: [facility] INVASIVE CV LAB;  Service: Cardiovascular;  Laterality: N/A;   TONSILLECTOMY         Home Medications    Prior  to Admission medications   Medication Sig Start Date End Date Taking? Authorizing Provider  doxycycline (VIBRAMYCIN) 100 MG capsule Take 1 capsule (100 mg total) by mouth 2 (two) times daily. 05/22/23  Yes Carlisle Beers, FNP  acetaminophen (TYLENOL) 500 MG tablet Take 500 mg by mouth every 6 (six) hours as needed for moderate pain.    [provider]  amLODipine (NORVASC) 10 MG tablet Take 1 tablet (10 mg total) by mouth daily. 03/31/23   Meriam Sprague, MD  aspirin EC 81 MG tablet Take 81 mg by mouth daily.    [provider]  Blood Glucose Monitoring Suppl (ONE TOUCH ULTRA 2) w/Device KIT Check blood glucose 3 times daily as directed    [provider]  carbidopa-levodopa (SINEMET IR) 25-100 MG tablet Take 1.5 tablets by mouth 3 (three) times daily. 6am/10-11am/3-4pm 02/21/23   Tat, Octaviano Batty, DO  clotrimazole-betamethasone (LOTRISONE) cream Apply 1 Application topically 2 (two) times daily as needed (yeast). 09/08/22   [provider]  finasteride (PROSCAR) 5 MG tablet Take 1 tablet (5 mg total) by mouth daily. 05/10/23   Mliss Sax, MD  Insulin Aspart Prot & Aspart (NOVOLOG MIX 70/30 [place]) Inject 30 Units into the skin 2 (two) times daily.    [provider]  JARDIANCE 10 MG TABS tablet Take 10 mg by mouth daily. 04/03/20   [provider]  losartan (COZAAR) 100 MG tablet Take 100 mg by mouth daily.    [provider]  meloxicam (MOBIC) 15 MG tablet Take 1 tablet (15 mg total) by mouth daily. 02/13/23   Lenda Kelp, MD  metFORMIN (GLUCOPHAGE) 1000 MG tablet Take 1 tablet (1,000 mg total) by mouth 2 (two) times daily with a meal. 05/26/22   Corky Crafts, MD  Multiple Vitamins-Minerals (PRESERVISION AREDS 2 PO) Take 1 capsule by mouth in the morning and at bedtime.    [provider]  omeprazole (PRILOSEC) 40 MG capsule Take 1 capsule (40 mg total) by mouth daily. 05/10/23   Mliss Sax, MD   [facility] ULTRA test strip  08/11/20   [provider]  simvastatin (ZOCOR) 40 MG tablet Take 1 tablet (40 mg total) by mouth at bedtime. 07/30/19   Dianne Dun, MD    Family History Family History  Problem Relation Age of Onset   Alzheimer's disease Mother    Heart disease Father        CHF   Stroke Father 61   Tremor Father    Tremor Sister    Pulmonary embolism Brother    Diabetes Brother    Heart disease Paternal Grandfather    Asthma Neg Hx    Colon cancer Neg Hx    Esophageal cancer Neg Hx    Pancreatic cancer Neg Hx    Prostate cancer Neg Hx    Rectal cancer Neg Hx  Stomach cancer Neg Hx     Social History Social History   Tobacco Use   Smoking status: Former    Current packs/day: 0.00    Types: Cigarettes, E-cigarettes    Quit date: 10/31/2012    Years since quitting: 10.5   Smokeless tobacco: Never   Tobacco comments:    smoked ages [age], up to 1 ppd . Smoked 2-3 cigars/ day [age]  Vaping Use   Vaping status: Some Days  Substance Use Topics   Alcohol use: Yes    Comment: 1-2 per week   Drug use: No     Allergies   Patient has no known allergies.   Review of Systems Review of Systems Per HPI  Physical Exam Triage Vital Signs ED Triage Vitals [05/22/23 1355]  Encounter Vitals Group     BP (!) 180/77     Systolic BP Percentile      Diastolic BP Percentile      Pulse Rate (!) 57     Resp 20     Temp 98.4 F (36.9 C)     Temp Source Oral     SpO2 95 %     Weight      Height      Head Circumference      Peak Flow      Pain Score 0     Pain Loc      Pain Education      Exclude from Growth Chart    No data found.  Updated Vital Signs BP (!) 173/73 (BP Location: Right Arm)   Pulse (!) 57   Temp 98.4 F (36.9 C) (Oral)   Resp 20   SpO2 95%   Visual Acuity Right Eye Distance:   Left Eye Distance:   Bilateral Distance:    Right Eye Near:   Left Eye Near:    Bilateral Near:     Physical Exam Vitals and nursing  note reviewed.  Constitutional:      Appearance: He is not ill-appearing or toxic-appearing.  HENT:     Head: Normocephalic and atraumatic.     Right Ear: Hearing and external ear normal.     Left Ear: Hearing and external ear normal.     Nose: Nose normal.     Mouth/Throat:     Lips: Pink.  Eyes:     General: Lids are normal. Vision grossly intact. Gaze aligned appropriately.     Extraocular Movements: Extraocular movements intact.     Conjunctiva/sclera: Conjunctivae normal.  Cardiovascular:     Rate and Rhythm: Normal rate and regular rhythm.     Heart sounds: Normal heart sounds, S1 normal and S2 normal.  Pulmonary:     Effort: Pulmonary effort is normal. No respiratory distress.     Breath sounds: Normal breath sounds and air entry.  Musculoskeletal:     Cervical back: Neck supple.  Skin:    General: Skin is warm and dry.     Capillary Refill: Capillary refill takes less than 2 seconds.     Findings: No rash.     Comments: Stage II pressure ulcer present to the left elbow as seen in image below.  Surrounding area of erythema, warmth, and tenderness present. Full ROM of left elbow. Strong left radial pulse.   Neurological:     General: No focal deficit present.     Mental Status: He is alert and oriented to person, place, and time. Mental status is at baseline.  Cranial Nerves: No dysarthria or facial asymmetry.  Psychiatric:        Mood and Affect: Mood normal.        Speech: Speech normal.        Behavior: Behavior normal.        Thought Content: Thought content normal.        Judgment: Judgment normal.           UC Treatments / Results  Labs (all labs ordered are listed, but only abnormal results are displayed) Labs Reviewed - No data to display  EKG   Radiology No results found.  Procedures Procedures (including critical care time)  Medications Ordered in UC Medications - No data to display  Initial Impression / Assessment and Plan / UC  Course  I have reviewed the triage vital signs and the nursing notes.  Pertinent labs & imaging results that were available during my care of the patient were reviewed by me and considered in my medical decision making (see chart for details).   1. Cellulitis of left elbow, pressure injury of left elbow, stage 2, type 2 diabetes with other skin ulcer, with long-term current use of insulin Presentation is consistent with infected pressure ulcer of the left elbow.  Doxycycline sent to be taken as prescribed for the next 10 days.  Warm compresses recommended.  Advised to avoid excessive pressure to the wound.  Neurovascularly intact distally to injury/wound.  Advised PCP follow-up in the next 5 to 7 days.  Strict ER return precautions discussed.  He is currently afebrile and there are no systemic signs of infection.   2.  Essential hypertension Blood pressure elevated in clinic in triage and on recheck.  Advised patient to continue taking antihypertensive medications as prescribed by primary care provider.  PCP follow-up to discuss elevated blood pressure as well in the next 5 to 7 days.  No red flag signs or symptoms indicating need for referral to the emergency department for further workup.  Counseled patient on potential for adverse effects with medications prescribed/recommended today, strict ER and return-to-clinic precautions discussed, patient verbalized understanding.    Final Clinical Impressions(s) / UC Diagnoses   Final diagnoses:  Cellulitis of left elbow  Pressure injury of other site, stage 2 (HCC)  Type 2 diabetes mellitus with other skin ulcer, with long-term current use of insulin [facility])     Discharge Instructions      Your pressure ulcer is infected to your left elbow.  Take doxycycline antibiotic twice a day for the next 10 days to treat infection. Avoid placing pressure to the area. Warm compresses will help with infection.  Schedule a follow-up appointment with PCP in  the next 5-7 days for re-check.  If you develop any new or worsening symptoms or if your symptoms do not start to improve, pleases return here or follow-up with your primary care provider. If your symptoms are severe, please go to the emergency room.   ED Prescriptions     Medication Sig Dispense Auth. Provider   doxycycline (VIBRAMYCIN) 100 MG capsule Take 1 capsule (100 mg total) by mouth 2 (two) times daily. 20 capsule Carlisle Beers, FNP      PDMP not reviewed this encounter.   Carlisle Beers, FNP 05/22/23 1436    Carlisle Beers, FNP 05/22/23 1437

## 2023-05-30 ENCOUNTER — Telehealth: Payer: Self-pay | Admitting: Family Medicine

## 2023-05-30 NOTE — Telephone Encounter (Signed)
Called pt and LVM asking pt to call back to schedule his AWV appt for the year. Last AWV was 03/16/2022

## 2023-06-13 ENCOUNTER — Ambulatory Visit (INDEPENDENT_AMBULATORY_CARE_PROVIDER_SITE_OTHER): Payer: Medicare Other | Admitting: Family Medicine

## 2023-06-13 ENCOUNTER — Encounter: Payer: Self-pay | Admitting: Family Medicine

## 2023-06-13 VITALS — BP 140/58 | HR 50 | Temp 98.0°F | Ht 68.0 in | Wt 229.6 lb

## 2023-06-13 DIAGNOSIS — N3941 Urge incontinence: Secondary | ICD-10-CM | POA: Diagnosis not present

## 2023-06-13 DIAGNOSIS — I10 Essential (primary) hypertension: Secondary | ICD-10-CM | POA: Diagnosis not present

## 2023-06-13 DIAGNOSIS — T887XXA Unspecified adverse effect of drug or medicament, initial encounter: Secondary | ICD-10-CM

## 2023-06-13 DIAGNOSIS — N400 Enlarged prostate without lower urinary tract symptoms: Secondary | ICD-10-CM

## 2023-06-13 DIAGNOSIS — E113593 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, bilateral: Secondary | ICD-10-CM | POA: Diagnosis not present

## 2023-06-13 DIAGNOSIS — Z794 Long term (current) use of insulin: Secondary | ICD-10-CM

## 2023-06-13 DIAGNOSIS — L03114 Cellulitis of left upper limb: Secondary | ICD-10-CM | POA: Diagnosis not present

## 2023-06-13 LAB — BASIC METABOLIC PANEL
BUN: 16 mg/dL (ref 6–23)
CO2: 24 mEq/L (ref 19–32)
Calcium: 10.2 mg/dL (ref 8.4–10.5)
Chloride: 106 mEq/L (ref 96–112)
Creatinine, Ser: 0.89 mg/dL (ref 0.40–1.50)
GFR: 82.55 mL/min (ref >60.00)
Glucose, Bld: 84 mg/dL (ref 70–99)
Potassium: 3.6 mEq/L (ref 3.5–5.1)
Sodium: 142 mEq/L (ref 135–145)

## 2023-06-13 LAB — URINALYSIS, ROUTINE W REFLEX MICROSCOPIC
Bilirubin Urine: NEGATIVE
Hgb urine dipstick: NEGATIVE
Leukocytes,Ua: NEGATIVE
Nitrite: NEGATIVE
RBC / HPF: NONE SEEN (ref 0–?)
Specific Gravity, Urine: 1.02 (ref 1.000–1.030)
Total Protein, Urine: 100 — AB
Urine Glucose: >=1000 — AB
Urobilinogen, UA: 1 (ref 0.0–1.0)
pH: 6 (ref 5.0–8.0)

## 2023-06-13 LAB — HEMOGLOBIN A1C: Hgb A1c MFr Bld: 6.1 % (ref 4.6–6.5)

## 2023-06-13 LAB — MICROALBUMIN / CREATININE URINE RATIO
Creatinine,U: 93.2 mg/dL
Microalb Creat Ratio: 57.1 mg/g — ABNORMAL HIGH (ref 0.0–30.0)
Microalb, Ur: 53.2 mg/dL — ABNORMAL HIGH (ref 0.0–1.9)

## 2023-06-13 MED ORDER — CEPHALEXIN 500 MG PO CAPS
500.0000 mg | ORAL_CAPSULE | Freq: Three times a day (TID) | ORAL | 0 refills | Status: AC
Start: 2023-06-13 — End: 2023-06-23

## 2023-06-13 MED ORDER — CHLORTHALIDONE 25 MG PO TABS: 25.0000 mg | ORAL_TABLET | Freq: Every day | ORAL | 0 refills | Status: DC

## 2023-06-13 MED ORDER — LOSARTAN POTASSIUM 100 MG PO TABS
100.0000 mg | ORAL_TABLET | Freq: Every day | ORAL | 1 refills | Status: DC
Start: 2023-06-13 — End: 2023-10-11

## 2023-06-13 NOTE — Progress Notes (Signed)
Established Patient Office Visit   Subjective:  Patient ID: Karl Rosalez., male    DOB: 06/10/1946  Age: 77 y.o. MRN: [id number]  No chief complaint on file.   HPI Encounter Diagnoses  Name Primary?   Essential hypertension Yes   Type 2 diabetes mellitus with both eyes affected by proliferative retinopathy without macular edema, with long-term current use of insulin (HCC)    Cellulitis of left elbow    Urge incontinence of urine    Benign prostatic hyperplasia, unspecified whether lower urinary tract symptoms present    Medication side effect    Patient developed swelling in his lower extremities after actually increasing the dosage of amlodipine to 10 mg daily.  He was seen in the emergency room recently for cellulitis of the left elbow and treated with 10 days of doxycycline.  Things improved until a week or so after completing his course of therapy.  Some erythema has returned.  There has been no streaking or discharge.  Is using a towel at his computer desk to avoid pressure sore.  Has mostly done well with finasteride.  Urine flow is improved but he also is experiencing urge incontinence.  PSA remained about the same from when it was last checked.  However it is less than half of where it was 7 years ago measured at 7.03.  Continues metformin and Jardiance for type 2 diabetes that is well-controlled.   Review of Systems  Constitutional: Negative.   HENT: Negative.    Eyes:  Negative for blurred vision, discharge and redness.  Respiratory: Negative.    Cardiovascular: Negative.   Gastrointestinal:  Negative for abdominal pain.  Genitourinary: Negative.   Musculoskeletal: Negative.  Negative for myalgias.  Skin:  Negative for rash.  Neurological:  Negative for tingling, loss of consciousness and weakness.  Endo/Heme/Allergies:  Negative for polydipsia.     Current Outpatient Medications:    acetaminophen (TYLENOL) 500 MG tablet, Take 500 mg by mouth every 6 (six) hours  as needed for moderate pain., Disp: , Rfl:    aspirin EC 81 MG tablet, Take 81 mg by mouth daily., Disp: , Rfl:    Blood Glucose Monitoring Suppl (ONE TOUCH ULTRA 2) w/Device KIT, Check blood glucose 3 times daily as directed, Disp: , Rfl:    carbidopa-levodopa (SINEMET IR) 25-100 MG tablet, Take 1.5 tablets by mouth 3 (three) times daily. 6am/10-11am/3-4pm, Disp: 405 tablet, Rfl: 1   cephALEXin (KEFLEX) 500 MG capsule, Take 1 capsule (500 mg total) by mouth 3 (three) times daily for 10 days., Disp: 30 capsule, Rfl: 0   chlorthalidone (HYGROTON) 25 MG tablet, Take 1 tablet (25 mg total) by mouth daily., Disp: 90 tablet, Rfl: 0   clotrimazole-betamethasone (LOTRISONE) cream, Apply 1 Application topically 2 (two) times daily as needed (yeast)., Disp: , Rfl:    finasteride (PROSCAR) 5 MG tablet, Take 1 tablet (5 mg total) by mouth daily., Disp: 90 tablet, Rfl: 3   Insulin Aspart Prot & Aspart (NOVOLOG MIX 70/30 [place]), Inject 30 Units into the skin 2 (two) times daily., Disp: , Rfl:    JARDIANCE 10 MG TABS tablet, Take 10 mg by mouth daily., Disp: , Rfl:    metFORMIN (GLUCOPHAGE) 1000 MG tablet, Take 1 tablet (1,000 mg total) by mouth 2 (two) times daily with a meal., Disp: 60 tablet, Rfl: 0   Multiple Vitamins-Minerals (PRESERVISION AREDS 2 PO), Take 1 capsule by mouth in the morning and at bedtime., Disp: , Rfl:  mupirocin ointment (BACTROBAN) 2 %, Apply 1 Application topically 2 (two) times daily., Disp: 22 g, Rfl: 0   omeprazole (PRILOSEC) 40 MG capsule, Take 1 capsule (40 mg total) by mouth daily., Disp: 90 capsule, Rfl: 3   ONETOUCH ULTRA test strip, , Disp: , Rfl:    simvastatin (ZOCOR) 40 MG tablet, Take 1 tablet (40 mg total) by mouth at bedtime., Disp: 90 tablet, Rfl: 3   doxycycline (VIBRAMYCIN) 100 MG capsule, Take 1 capsule (100 mg total) by mouth 2 (two) times daily. (Patient not taking: Reported on 06/13/2023), Disp: 20 capsule, Rfl: 0   losartan (COZAAR) 100 MG tablet, Take 1 tablet (100  mg total) by mouth daily., Disp: 90 tablet, Rfl: 1   meloxicam (MOBIC) 15 MG tablet, Take 1 tablet (15 mg total) by mouth daily. (Patient not taking: Reported on 06/13/2023), Disp: 30 tablet, Rfl: 2   Objective:     BP (!) 140/58   Pulse (!) 50   Temp 98 F (36.7 C)   Ht 5\' 8"  (1.727 m)   Wt 229 lb 9.6 oz (104.1 kg)   SpO2 95%   BMI 34.91 kg/m    Physical Exam Constitutional:      General: He is not in acute distress.    Appearance: Normal appearance. He is not ill-appearing, toxic-appearing or diaphoretic.  HENT:     Head: Normocephalic and atraumatic.     Right Ear: External ear normal.     Left Ear: External ear normal.  Eyes:     General: No scleral icterus.       Right eye: No discharge.        Left eye: No discharge.     Extraocular Movements: Extraocular movements intact.     Conjunctiva/sclera: Conjunctivae normal.  Cardiovascular:     Rate and Rhythm: Normal rate and regular rhythm.  Pulmonary:     Effort: Pulmonary effort is normal. No respiratory distress.     Breath sounds: Normal breath sounds. No wheezing, rhonchi or rales.  Skin:    General: Skin is warm and dry.       Neurological:     Mental Status: He is alert and oriented to person, place, and time.  Psychiatric:        Mood and Affect: Mood normal.        Behavior: Behavior normal.      No results found for any visits on 06/13/23.    The ASCVD Risk score (Arnett DK, et al., 2019) failed to calculate for the following reasons:   The valid total cholesterol range is 130 to 320 mg/dL    Assessment & Plan:   Essential hypertension -     Basic metabolic panel -     Microalbumin / creatinine urine ratio -     Urinalysis, Routine w reflex microscopic -     Chlorthalidone; Take 1 tablet (25 mg total) by mouth daily.  Dispense: 90 tablet; Refill: 0 -     Losartan Potassium; Take 1 tablet (100 mg total) by mouth daily.  Dispense: 90 tablet; Refill: 1  Type 2 diabetes mellitus with both eyes  affected by proliferative retinopathy without macular edema, with long-term current use of insulin (HCC) -     Basic metabolic panel -     Hemoglobin A1c -     Microalbumin / creatinine urine ratio -     Urinalysis, Routine w reflex microscopic -     Losartan Potassium; Take 1 tablet (100 mg total)  by mouth daily.  Dispense: 90 tablet; Refill: 1  Cellulitis of left elbow -     Cephalexin; Take 1 capsule (500 mg total) by mouth 3 (three) times daily for 10 days.  Dispense: 30 capsule; Refill: 0  Urge incontinence of urine -     Ambulatory referral to Urology  Benign prostatic hyperplasia, unspecified whether lower urinary tract symptoms present -     Ambulatory referral to Urology  Medication side effect    Return in about 6 weeks (around 07/25/2023), or Stop Amlodipine and start chlorthalidone..  Continue with losartan.  10 days of cephalexin 3 times daily.  Referral back to urology for follow-up of BPH and urge incontinence.   Mliss Sax, MD

## 2023-06-21 ENCOUNTER — Telehealth: Payer: Self-pay | Admitting: Family Medicine

## 2023-06-21 NOTE — Telephone Encounter (Signed)
Pt called and stated that he need a referral for an urology

## 2023-06-22 ENCOUNTER — Encounter (INDEPENDENT_AMBULATORY_CARE_PROVIDER_SITE_OTHER): Payer: Self-pay

## 2023-07-03 ENCOUNTER — Encounter: Payer: Self-pay | Admitting: Family Medicine

## 2023-07-03 ENCOUNTER — Ambulatory Visit (INDEPENDENT_AMBULATORY_CARE_PROVIDER_SITE_OTHER): Payer: Medicare Other | Admitting: Family Medicine

## 2023-07-03 VITALS — BP 130/50 | HR 44 | Temp 98.2°F | Ht 68.0 in | Wt 215.0 lb

## 2023-07-03 DIAGNOSIS — I10 Essential (primary) hypertension: Secondary | ICD-10-CM

## 2023-07-03 NOTE — Progress Notes (Signed)
Established Patient Office Visit   Subjective:  Patient ID: Karl Andren., male    DOB: 14-May-1946  Age: 77 y.o. MRN: [id number]  Chief Complaint  Patient presents with   Dizziness    Dizziness Pertinent negatives include no abdominal pain, myalgias, rash or weakness.   Encounter Diagnoses  Name Primary?   Essential hypertension Yes   After stopping amlodipine 10 mg secondary to lower extremity swelling and starting 25 mg of chlorthalidone patient has developed lightheadedness and blood pressure has been running in the low 100s over 50.  He brings his home cuff with him today.   Review of Systems  Constitutional: Negative.   HENT: Negative.    Eyes:  Negative for blurred vision, discharge and redness.  Respiratory: Negative.    Cardiovascular: Negative.   Gastrointestinal:  Negative for abdominal pain.  Genitourinary: Negative.   Musculoskeletal: Negative.  Negative for myalgias.  Skin:  Negative for rash.  Neurological:  Positive for dizziness. Negative for tingling, loss of consciousness and weakness.  Endo/Heme/Allergies:  Negative for polydipsia.     Current Outpatient Medications:    acetaminophen (TYLENOL) 500 MG tablet, Take 500 mg by mouth every 6 (six) hours as needed for moderate pain., Disp: , Rfl:    aspirin EC 81 MG tablet, Take 81 mg by mouth daily., Disp: , Rfl:    Blood Glucose Monitoring Suppl (ONE TOUCH ULTRA 2) w/Device KIT, Check blood glucose 3 times daily as directed, Disp: , Rfl:    carbidopa-levodopa (SINEMET IR) 25-100 MG tablet, Take 1.5 tablets by mouth 3 (three) times daily. 6am/10-11am/3-4pm, Disp: 405 tablet, Rfl: 1   chlorthalidone (HYGROTON) 25 MG tablet, Take 1 tablet (25 mg total) by mouth daily., Disp: 90 tablet, Rfl: 0   clotrimazole-betamethasone (LOTRISONE) cream, Apply 1 Application topically 2 (two) times daily as needed (yeast)., Disp: , Rfl:    finasteride (PROSCAR) 5 MG tablet, Take 1 tablet (5 mg total) by mouth daily.,  Disp: 90 tablet, Rfl: 3   Insulin Aspart Prot & Aspart (NOVOLOG MIX 70/30 [place]), Inject 30 Units into the skin 2 (two) times daily., Disp: , Rfl:    JARDIANCE 10 MG TABS tablet, Take 10 mg by mouth daily., Disp: , Rfl:    losartan (COZAAR) 100 MG tablet, Take 1 tablet (100 mg total) by mouth daily., Disp: 90 tablet, Rfl: 1   meloxicam (MOBIC) 15 MG tablet, Take 1 tablet (15 mg total) by mouth daily. (Patient not taking: Reported on 06/13/2023), Disp: 30 tablet, Rfl: 2   metFORMIN (GLUCOPHAGE) 1000 MG tablet, Take 1 tablet (1,000 mg total) by mouth 2 (two) times daily with a meal., Disp: 60 tablet, Rfl: 0   Multiple Vitamins-Minerals (PRESERVISION AREDS 2 PO), Take 1 capsule by mouth in the morning and at bedtime., Disp: , Rfl:    mupirocin ointment (BACTROBAN) 2 %, Apply 1 Application topically 2 (two) times daily., Disp: 22 g, Rfl: 0   omeprazole (PRILOSEC) 40 MG capsule, Take 1 capsule (40 mg total) by mouth daily., Disp: 90 capsule, Rfl: 3   ONETOUCH ULTRA test strip, , Disp: , Rfl:    simvastatin (ZOCOR) 40 MG tablet, Take 1 tablet (40 mg total) by mouth at bedtime., Disp: 90 tablet, Rfl: 3   Objective:     BP (!) 130/50   Pulse (!) 44   Temp 98.2 F (36.8 C)   Ht 5\' 8"  (1.727 m)   Wt 215 lb (97.5 kg)   SpO2 95%   BMI  32.69 kg/m    Physical Exam Constitutional:      General: He is not in acute distress.    Appearance: Normal appearance. He is not ill-appearing, toxic-appearing or diaphoretic.  HENT:     Head: Normocephalic and atraumatic.     Right Ear: External ear normal.     Left Ear: External ear normal.  Eyes:     General: No scleral icterus.       Right eye: No discharge.        Left eye: No discharge.     Extraocular Movements: Extraocular movements intact.     Conjunctiva/sclera: Conjunctivae normal.  Pulmonary:     Effort: Pulmonary effort is normal. No respiratory distress.  Skin:    General: Skin is warm and dry.  Neurological:     Mental Status: He is alert  and oriented to person, place, and time.  Psychiatric:        Mood and Affect: Mood normal.        Behavior: Behavior normal.      No results found for any visits on 07/03/23.    The ASCVD Risk score (Arnett DK, et al., 2019) failed to calculate for the following reasons:   The valid total cholesterol range is 130 to 320 mg/dL    Assessment & Plan:   Essential hypertension    Return Already has scheduled follow-up in the next few weeks.  Take one half of the 25 mg chlorthalidone..  Continue losartan.  Demonstrated proper use of his home cuff.  He had been placing it on his forearm.  Mliss Sax, MD

## 2023-07-05 ENCOUNTER — Ambulatory Visit: Payer: Medicare Other | Admitting: Family Medicine

## 2023-07-18 ENCOUNTER — Ambulatory Visit (INDEPENDENT_AMBULATORY_CARE_PROVIDER_SITE_OTHER): Payer: Medicare Other | Admitting: Family Medicine

## 2023-07-18 ENCOUNTER — Encounter: Payer: Self-pay | Admitting: Family Medicine

## 2023-07-18 VITALS — BP 100/48 | HR 52 | Temp 98.0°F | Resp 16 | Ht 68.0 in | Wt 215.4 lb

## 2023-07-18 DIAGNOSIS — R42 Dizziness and giddiness: Secondary | ICD-10-CM

## 2023-07-18 DIAGNOSIS — R001 Bradycardia, unspecified: Secondary | ICD-10-CM | POA: Diagnosis not present

## 2023-07-18 DIAGNOSIS — I10 Essential (primary) hypertension: Secondary | ICD-10-CM

## 2023-07-18 LAB — URINALYSIS, ROUTINE W REFLEX MICROSCOPIC
Bilirubin Urine: NEGATIVE
Hgb urine dipstick: NEGATIVE
Ketones, ur: NEGATIVE
Leukocytes,Ua: NEGATIVE
Nitrite: NEGATIVE
RBC / HPF: NONE SEEN (ref 0–?)
Specific Gravity, Urine: 1.01 (ref 1.000–1.030)
Total Protein, Urine: NEGATIVE
Urine Glucose: >=1000 — AB
Urobilinogen, UA: 0.2 (ref 0.0–1.0)
pH: 6.5 (ref 5.0–8.0)

## 2023-07-18 NOTE — Progress Notes (Signed)
Established Patient Office Visit   Subjective:  Patient ID: Karl Lawson., male    DOB: October 15, 1946  Age: 77 y.o. MRN: [id number]  Chief Complaint  Patient presents with  . Follow-up    Follow up    HPI Encounter Diagnoses  Name Primary?  . Lightheadedness Yes  . Essential hypertension   . Bradycardia    Blood pressure at home on 100 mg of losartan and 12.5 mg chlorthalidone has been running in the low 130s over upper 50s.  He still becomes lightheaded with standing.   Review of Systems  Constitutional: Negative.   HENT: Negative.    Eyes:  Negative for blurred vision, discharge and redness.  Respiratory: Negative.    Cardiovascular: Negative.   Gastrointestinal:  Negative for abdominal pain.  Genitourinary: Negative.   Musculoskeletal: Negative.  Negative for myalgias.  Skin:  Negative for rash.  Neurological:  Negative for tingling, loss of consciousness and weakness.  Endo/Heme/Allergies:  Negative for polydipsia.     Current Outpatient Medications:  .  acetaminophen (TYLENOL) 500 MG tablet, Take 500 mg by mouth every 6 (six) hours as needed for moderate pain., Disp: , Rfl:  .  aspirin EC 81 MG tablet, Take 81 mg by mouth daily., Disp: , Rfl:  .  Blood Glucose Monitoring Suppl (ONE TOUCH ULTRA 2) w/Device KIT, Check blood glucose 3 times daily as directed, Disp: , Rfl:  .  carbidopa-levodopa (SINEMET IR) 25-100 MG tablet, Take 1.5 tablets by mouth 3 (three) times daily. 6am/10-11am/3-4pm, Disp: 405 tablet, Rfl: 1 .  clotrimazole-betamethasone (LOTRISONE) cream, Apply 1 Application topically 2 (two) times daily as needed (yeast)., Disp: , Rfl:  .  finasteride (PROSCAR) 5 MG tablet, Take 1 tablet (5 mg total) by mouth daily., Disp: 90 tablet, Rfl: 3 .  Insulin Aspart Prot & Aspart (NOVOLOG MIX 70/30 [place]), Inject 30 Units into the skin 2 (two) times daily., Disp: , Rfl:  .  JARDIANCE 10 MG TABS tablet, Take 10 mg by mouth daily., Disp: , Rfl:  .  losartan (COZAAR)  100 MG tablet, Take 1 tablet (100 mg total) by mouth daily., Disp: 90 tablet, Rfl: 1 .  meloxicam (MOBIC) 15 MG tablet, Take 1 tablet (15 mg total) by mouth daily., Disp: 30 tablet, Rfl: 2 .  metFORMIN (GLUCOPHAGE) 1000 MG tablet, Take 1 tablet (1,000 mg total) by mouth 2 (two) times daily with a meal., Disp: 60 tablet, Rfl: 0 .  Multiple Vitamins-Minerals (PRESERVISION AREDS 2 PO), Take 1 capsule by mouth in the morning and at bedtime., Disp: , Rfl:  .  mupirocin ointment (BACTROBAN) 2 %, Apply 1 Application topically 2 (two) times daily., Disp: 22 g, Rfl: 0 .  omeprazole (PRILOSEC) 40 MG capsule, Take 1 capsule (40 mg total) by mouth daily., Disp: 90 capsule, Rfl: 3 .  ONETOUCH ULTRA test strip, , Disp: , Rfl:  .  simvastatin (ZOCOR) 40 MG tablet, Take 1 tablet (40 mg total) by mouth at bedtime., Disp: 90 tablet, Rfl: 3   Objective:     BP (!) 100/48 (BP Location: Left Arm, Patient Position: Standing, Cuff Size: Normal)   Pulse (!) 52   Temp 98 F (36.7 C) (Oral)   Resp 16   Ht 5\' 8"  (1.727 m)   Wt 215 lb 6.4 oz (97.7 kg)   SpO2 97%   BMI 32.75 kg/m  BP Readings from Last 3 Encounters:  07/18/23 (!) 100/48  07/03/23 (!) 130/50  06/13/23 (!) 140/58  Wt Readings from Last 3 Encounters:  07/18/23 215 lb 6.4 oz (97.7 kg)  07/03/23 215 lb (97.5 kg)  06/13/23 229 lb 9.6 oz (104.1 kg)      Physical Exam Constitutional:      General: He is not in acute distress.    Appearance: Normal appearance. He is not ill-appearing, toxic-appearing or diaphoretic.  HENT:     Head: Normocephalic and atraumatic.     Right Ear: External ear normal.     Left Ear: External ear normal.     Mouth/Throat:     Mouth: Mucous membranes are moist.     Pharynx: Oropharynx is clear. No oropharyngeal exudate or posterior oropharyngeal erythema.  Eyes:     General: No scleral icterus.       Right eye: No discharge.        Left eye: No discharge.     Extraocular Movements: Extraocular movements intact.      Conjunctiva/sclera: Conjunctivae normal.     Pupils: Pupils are equal, round, and reactive to light.  Cardiovascular:     Rate and Rhythm: Regular rhythm. Bradycardia present.  Pulmonary:     Effort: Pulmonary effort is normal. No respiratory distress.     Breath sounds: Normal breath sounds.  Abdominal:     General: Bowel sounds are normal.  Musculoskeletal:     Cervical back: No rigidity or tenderness.  Skin:    General: Skin is warm and dry.  Neurological:     Mental Status: He is alert and oriented to person, place, and time.  Psychiatric:        Mood and Affect: Mood normal.        Behavior: Behavior normal.  Most pain this was one-piece spit up 110/58 lying   No results found for any visits on 07/18/23.    The ASCVD Risk score (Arnett DK, et al., 2019) failed to calculate for the following reasons:   The valid total cholesterol range is 130 to 320 mg/dL    Assessment & Plan:   Lightheadedness -     Urinalysis, Routine w reflex microscopic  Essential hypertension -     EKG 12-Lead  Bradycardia -     EKG 12-Lead    Return in about 4 weeks (around 08/15/2023), or stop chlorthalidone.  EKG confirms known bradycardia with a left and right bundle branch blocks and a Mobitz 1 block.  Stop chlorthalidone entirely.  With orthostatic checks systolic pressure went from 140 lying to 120 sitting to 100 standing.  Diastolic pressure remained around 50.  Pulse rate was stable at 52.  Stay hydrated.  Continue to check blood pressures periodically.  Mliss Sax, MD

## 2023-08-16 ENCOUNTER — Other Ambulatory Visit: Payer: Self-pay

## 2023-08-16 ENCOUNTER — Encounter (HOSPITAL_COMMUNITY): Payer: Self-pay | Admitting: Emergency Medicine

## 2023-08-16 ENCOUNTER — Inpatient Hospital Stay (HOSPITAL_COMMUNITY)
Admission: EM | Admit: 2023-08-16 | Discharge: 2023-08-25 | DRG: 242 | Disposition: A | Discharge status: Skilled Nursing Facility | Payer: Medicare Other | Attending: Family Medicine | Admitting: Family Medicine

## 2023-08-16 ENCOUNTER — Other Ambulatory Visit: Payer: Self-pay | Admitting: Neurology

## 2023-08-16 DIAGNOSIS — F05 Delirium due to known physiological condition: Secondary | ICD-10-CM | POA: Diagnosis not present

## 2023-08-16 DIAGNOSIS — Z1152 Encounter for screening for COVID-19: Secondary | ICD-10-CM

## 2023-08-16 DIAGNOSIS — I442 Atrioventricular block, complete: Secondary | ICD-10-CM | POA: Diagnosis not present

## 2023-08-16 DIAGNOSIS — R410 Disorientation, unspecified: Secondary | ICD-10-CM | POA: Diagnosis present

## 2023-08-16 DIAGNOSIS — I2510 Atherosclerotic heart disease of native coronary artery without angina pectoris: Secondary | ICD-10-CM | POA: Diagnosis present

## 2023-08-16 DIAGNOSIS — J209 Acute bronchitis, unspecified: Secondary | ICD-10-CM | POA: Diagnosis present

## 2023-08-16 DIAGNOSIS — R42 Dizziness and giddiness: Secondary | ICD-10-CM | POA: Diagnosis not present

## 2023-08-16 DIAGNOSIS — E113593 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, bilateral: Secondary | ICD-10-CM | POA: Diagnosis present

## 2023-08-16 DIAGNOSIS — R001 Bradycardia, unspecified: Secondary | ICD-10-CM | POA: Diagnosis present

## 2023-08-16 DIAGNOSIS — Z8249 Family history of ischemic heart disease and other diseases of the circulatory system: Secondary | ICD-10-CM

## 2023-08-16 DIAGNOSIS — Z9181 History of falling: Secondary | ICD-10-CM

## 2023-08-16 DIAGNOSIS — I10 Essential (primary) hypertension: Secondary | ICD-10-CM | POA: Diagnosis present

## 2023-08-16 DIAGNOSIS — E669 Obesity, unspecified: Secondary | ICD-10-CM | POA: Diagnosis present

## 2023-08-16 DIAGNOSIS — Z79899 Other long term (current) drug therapy: Secondary | ICD-10-CM

## 2023-08-16 DIAGNOSIS — N179 Acute kidney failure, unspecified: Secondary | ICD-10-CM | POA: Diagnosis present

## 2023-08-16 DIAGNOSIS — Z8701 Personal history of pneumonia (recurrent): Secondary | ICD-10-CM

## 2023-08-16 DIAGNOSIS — Z7984 Long term (current) use of oral hypoglycemic drugs: Secondary | ICD-10-CM

## 2023-08-16 DIAGNOSIS — I4891 Unspecified atrial fibrillation: Secondary | ICD-10-CM | POA: Diagnosis present

## 2023-08-16 DIAGNOSIS — K219 Gastro-esophageal reflux disease without esophagitis: Secondary | ICD-10-CM | POA: Diagnosis present

## 2023-08-16 DIAGNOSIS — Z87891 Personal history of nicotine dependence: Secondary | ICD-10-CM

## 2023-08-16 DIAGNOSIS — Z791 Long term (current) use of non-steroidal anti-inflammatories (NSAID): Secondary | ICD-10-CM

## 2023-08-16 DIAGNOSIS — J440 Chronic obstructive pulmonary disease with acute lower respiratory infection: Secondary | ICD-10-CM | POA: Diagnosis present

## 2023-08-16 DIAGNOSIS — R059 Cough, unspecified: Secondary | ICD-10-CM

## 2023-08-16 DIAGNOSIS — G20A1 Parkinson's disease without dyskinesia, without mention of fluctuations: Secondary | ICD-10-CM | POA: Diagnosis present

## 2023-08-16 DIAGNOSIS — N400 Enlarged prostate without lower urinary tract symptoms: Secondary | ICD-10-CM | POA: Diagnosis present

## 2023-08-16 DIAGNOSIS — J690 Pneumonitis due to inhalation of food and vomit: Secondary | ICD-10-CM | POA: Diagnosis not present

## 2023-08-16 DIAGNOSIS — Z833 Family history of diabetes mellitus: Secondary | ICD-10-CM

## 2023-08-16 DIAGNOSIS — Z794 Long term (current) use of insulin: Secondary | ICD-10-CM

## 2023-08-16 DIAGNOSIS — Z832 Family history of diseases of the blood and blood-forming organs and certain disorders involving the immune mechanism: Secondary | ICD-10-CM

## 2023-08-16 DIAGNOSIS — H109 Unspecified conjunctivitis: Secondary | ICD-10-CM | POA: Diagnosis present

## 2023-08-16 DIAGNOSIS — Z820 Family history of epilepsy and other diseases of the nervous system: Secondary | ICD-10-CM

## 2023-08-16 DIAGNOSIS — E785 Hyperlipidemia, unspecified: Secondary | ICD-10-CM | POA: Diagnosis present

## 2023-08-16 DIAGNOSIS — I169 Hypertensive crisis, unspecified: Secondary | ICD-10-CM | POA: Diagnosis not present

## 2023-08-16 DIAGNOSIS — I951 Orthostatic hypotension: Secondary | ICD-10-CM | POA: Diagnosis present

## 2023-08-16 DIAGNOSIS — Z6831 Body mass index (BMI) 31.0-31.9, adult: Secondary | ICD-10-CM

## 2023-08-16 DIAGNOSIS — Z7982 Long term (current) use of aspirin: Secondary | ICD-10-CM

## 2023-08-16 DIAGNOSIS — J22 Unspecified acute lower respiratory infection: Secondary | ICD-10-CM | POA: Diagnosis present

## 2023-08-16 DIAGNOSIS — G9341 Metabolic encephalopathy: Secondary | ICD-10-CM | POA: Insufficient documentation

## 2023-08-16 DIAGNOSIS — Z823 Family history of stroke: Secondary | ICD-10-CM

## 2023-08-16 LAB — CBC
HCT: 40.1 % (ref 39.0–52.0)
Hemoglobin: 12.8 g/dL — ABNORMAL LOW (ref 13.0–17.0)
MCH: 26.3 pg (ref 26.0–34.0)
MCHC: 31.9 g/dL (ref 30.0–36.0)
MCV: 82.3 fL (ref 80.0–100.0)
Platelets: 249 10*3/uL (ref 150–400)
RBC: 4.87 MIL/uL (ref 4.22–5.81)
RDW: 15.9 % — ABNORMAL HIGH (ref 11.5–15.5)
WBC: 10.8 10*3/uL — ABNORMAL HIGH (ref 4.0–10.5)
nRBC: 0 % (ref 0.0–0.2)

## 2023-08-16 LAB — CBG MONITORING, ED
Glucose-Capillary: 163 mg/dL — ABNORMAL HIGH (ref 70–99)
Glucose-Capillary: 144 mg/dL — ABNORMAL HIGH (ref 70–99)

## 2023-08-16 LAB — BASIC METABOLIC PANEL
Anion gap: 14 (ref 5–15)
BUN: 20 mg/dL (ref 8–23)
CO2: 22 mmol/L (ref 22–32)
Calcium: 10 mg/dL (ref 8.9–10.3)
Chloride: 101 mmol/L (ref 98–111)
Creatinine, Ser: 1.24 mg/dL (ref 0.61–1.24)
GFR, Estimated: 60 mL/min — ABNORMAL LOW (ref >60)
Glucose, Bld: 161 mg/dL — ABNORMAL HIGH (ref 70–99)
Potassium: 3.7 mmol/L (ref 3.5–5.1)
Sodium: 137 mmol/L (ref 135–145)

## 2023-08-16 NOTE — ED Notes (Signed)
Waiting on son to come back to triage to give information.

## 2023-08-16 NOTE — ED Triage Notes (Addendum)
Pt reports he's had a cough since last Friday, started taking cough syrup which made him feel dizzy. Pt states he stopped taking it, and started taking tylenol with improvement. PT then started taking a different cough medicine again, symptoms returned. Pt Aox4. Pt also noted to have drainage from R eye.  Pt states his PCP changed his BP meds 2 weeks ago.   Pt's son states pt started with confusion and hallucinating when he started his new parkinson's medications months ago. Son believes confusion and hallucinations worsened past few weeks with cough medications.

## 2023-08-17 ENCOUNTER — Encounter (HOSPITAL_COMMUNITY): Payer: Self-pay | Admitting: Internal Medicine

## 2023-08-17 ENCOUNTER — Emergency Department (HOSPITAL_COMMUNITY): Payer: Medicare Other

## 2023-08-17 DIAGNOSIS — R410 Disorientation, unspecified: Secondary | ICD-10-CM | POA: Diagnosis not present

## 2023-08-17 DIAGNOSIS — G9341 Metabolic encephalopathy: Secondary | ICD-10-CM | POA: Insufficient documentation

## 2023-08-17 LAB — RESP PANEL BY RT-PCR (RSV, FLU A&B, COVID)  RVPGX2
Influenza A by PCR: NEGATIVE
Influenza B by PCR: NEGATIVE
Resp Syncytial Virus by PCR: NEGATIVE
SARS Coronavirus 2 by RT PCR: NEGATIVE

## 2023-08-17 LAB — COMPREHENSIVE METABOLIC PANEL
ALT: 16 U/L (ref 0–44)
AST: 23 U/L (ref 15–41)
Albumin: 3.3 g/dL — ABNORMAL LOW (ref 3.5–5.0)
Alkaline Phosphatase: 61 U/L (ref 38–126)
Anion gap: 22 — ABNORMAL HIGH (ref 5–15)
BUN: 24 mg/dL — ABNORMAL HIGH (ref 8–23)
CO2: 16 mmol/L — ABNORMAL LOW (ref 22–32)
Calcium: 9.9 mg/dL (ref 8.9–10.3)
Chloride: 102 mmol/L (ref 98–111)
Creatinine, Ser: 1.26 mg/dL — ABNORMAL HIGH (ref 0.61–1.24)
GFR, Estimated: 59 mL/min — ABNORMAL LOW (ref >60)
Glucose, Bld: 199 mg/dL — ABNORMAL HIGH (ref 70–99)
Potassium: 3.6 mmol/L (ref 3.5–5.1)
Sodium: 140 mmol/L (ref 135–145)
Total Bilirubin: 2.1 mg/dL — ABNORMAL HIGH (ref 0.3–1.2)
Total Protein: 6.2 g/dL — ABNORMAL LOW (ref 6.5–8.1)

## 2023-08-17 LAB — CBG MONITORING, ED
Glucose-Capillary: 180 mg/dL — ABNORMAL HIGH (ref 70–99)
Glucose-Capillary: 178 mg/dL — ABNORMAL HIGH (ref 70–99)

## 2023-08-17 LAB — RAPID URINE DRUG SCREEN, HOSP PERFORMED
Amphetamines: NOT DETECTED
Barbiturates: NOT DETECTED
Benzodiazepines: NOT DETECTED
Cocaine: NOT DETECTED
Opiates: NOT DETECTED
Tetrahydrocannabinol: NOT DETECTED

## 2023-08-17 LAB — CBC WITH DIFFERENTIAL/PLATELET
Abs Immature Granulocytes: 0.03 10*3/uL (ref 0.00–0.07)
Basophils Absolute: 0 10*3/uL (ref 0.0–0.1)
Basophils Relative: 0 %
Eosinophils Absolute: 0 10*3/uL (ref 0.0–0.5)
Eosinophils Relative: 0 %
HCT: 39.9 % (ref 39.0–52.0)
Hemoglobin: 12.5 g/dL — ABNORMAL LOW (ref 13.0–17.0)
Immature Granulocytes: 0 %
Lymphocytes Relative: 5 %
Lymphs Abs: 0.4 10*3/uL — ABNORMAL LOW (ref 0.7–4.0)
MCH: 26 pg (ref 26.0–34.0)
MCHC: 31.3 g/dL (ref 30.0–36.0)
MCV: 83 fL (ref 80.0–100.0)
Monocytes Absolute: 1.3 10*3/uL — ABNORMAL HIGH (ref 0.1–1.0)
Monocytes Relative: 14 %
Neutro Abs: 7.2 10*3/uL (ref 1.7–7.7)
Neutrophils Relative %: 81 %
Platelets: 244 10*3/uL (ref 150–400)
RBC: 4.81 MIL/uL (ref 4.22–5.81)
RDW: 15.8 % — ABNORMAL HIGH (ref 11.5–15.5)
WBC: 8.9 10*3/uL (ref 4.0–10.5)
nRBC: 0 % (ref 0.0–0.2)

## 2023-08-17 LAB — URINALYSIS, ROUTINE W REFLEX MICROSCOPIC
Bacteria, UA: NONE SEEN
Bilirubin Urine: NEGATIVE
Glucose, UA: >=500 mg/dL — AB
Ketones, ur: 80 mg/dL — AB
Leukocytes,Ua: NEGATIVE
Nitrite: NEGATIVE
Protein, ur: >=300 mg/dL — AB
Specific Gravity, Urine: 1.026 (ref 1.005–1.030)
pH: 5 (ref 5.0–8.0)

## 2023-08-17 LAB — GLUCOSE, CAPILLARY
Glucose-Capillary: 203 mg/dL — ABNORMAL HIGH (ref 70–99)
Glucose-Capillary: 175 mg/dL — ABNORMAL HIGH (ref 70–99)

## 2023-08-17 LAB — T4, FREE: Free T4: 1.41 ng/dL — ABNORMAL HIGH (ref 0.61–1.12)

## 2023-08-17 LAB — TSH: TSH: 1.745 u[IU]/mL (ref 0.350–4.500)

## 2023-08-17 LAB — AMMONIA: Ammonia: 32 umol/L (ref 9–35)

## 2023-08-17 MED ORDER — SODIUM CHLORIDE 0.9 % IV SOLN
1.0000 g | Freq: Once | INTRAVENOUS | Status: AC
  Administered 2023-08-17: 1 g via INTRAVENOUS
  Filled 2023-08-17: qty 10

## 2023-08-17 MED ORDER — IPRATROPIUM BROMIDE 0.02 % IN SOLN
0.5000 mg | Freq: Once | RESPIRATORY_TRACT | Status: AC
  Administered 2023-08-17: 0.5 mg via RESPIRATORY_TRACT
  Filled 2023-08-17: qty 2.5

## 2023-08-17 MED ORDER — CARBIDOPA-LEVODOPA 25-100 MG PO TABS
1.5000 | ORAL_TABLET | Freq: Three times a day (TID) | ORAL | Status: DC
  Administered 2023-08-17 – 2023-08-25 (×25): 1.5 via ORAL
  Filled 2023-08-17 (×21): qty 1.5
  Filled 2023-08-17: qty 2
  Filled 2023-08-17 (×4): qty 1.5

## 2023-08-17 MED ORDER — SIMVASTATIN 20 MG PO TABS
40.0000 mg | ORAL_TABLET | Freq: Every day | ORAL | Status: DC
  Administered 2023-08-17 – 2023-08-20 (×4): 40 mg via ORAL
  Filled 2023-08-17 (×4): qty 2

## 2023-08-17 MED ORDER — ALBUTEROL SULFATE (2.5 MG/3ML) 0.083% IN NEBU
10.0000 mg/h | INHALATION_SOLUTION | Freq: Once | RESPIRATORY_TRACT | Status: AC
  Administered 2023-08-17: 10 mg/h via RESPIRATORY_TRACT
  Filled 2023-08-17: qty 12

## 2023-08-17 MED ORDER — POLYETHYLENE GLYCOL 3350 17 G PO PACK
17.0000 g | PACK | Freq: Every day | ORAL | Status: DC | PRN
  Administered 2023-08-19 – 2023-08-20 (×2): 17 g via ORAL

## 2023-08-17 MED ORDER — ACETAMINOPHEN 650 MG RE SUPP: 650.0000 mg | Freq: Four times a day (QID) | RECTAL | Status: DC | PRN

## 2023-08-17 MED ORDER — INSULIN ASPART 100 UNIT/ML IJ SOLN
0.0000 [IU] | Freq: Three times a day (TID) | INTRAMUSCULAR | Status: DC
  Administered 2023-08-17: 3 [IU] via SUBCUTANEOUS
  Administered 2023-08-17 – 2023-08-19 (×3): 2 [IU] via SUBCUTANEOUS
  Administered 2023-08-20 (×2): 3 [IU] via SUBCUTANEOUS
  Administered 2023-08-21 (×2): 2 [IU] via SUBCUTANEOUS
  Administered 2023-08-22: 1 [IU] via SUBCUTANEOUS
  Administered 2023-08-22 – 2023-08-24 (×5): 2 [IU] via SUBCUTANEOUS
  Administered 2023-08-24: 3 [IU] via SUBCUTANEOUS
  Administered 2023-08-24: 1 [IU] via SUBCUTANEOUS
  Administered 2023-08-25 (×2): 2 [IU] via SUBCUTANEOUS
  Administered 2023-08-25: 3 [IU] via SUBCUTANEOUS

## 2023-08-17 MED ORDER — INSULIN ASPART 100 UNIT/ML IJ SOLN
0.0000 [IU] | Freq: Every day | INTRAMUSCULAR | Status: DC
  Administered 2023-08-18 – 2023-08-22 (×3): 2 [IU] via SUBCUTANEOUS

## 2023-08-17 MED ORDER — ALBUTEROL SULFATE HFA 108 (90 BASE) MCG/ACT IN AERS: 2.0000 | INHALATION_SPRAY | Freq: Once | RESPIRATORY_TRACT | Status: DC

## 2023-08-17 MED ORDER — ACETAMINOPHEN 325 MG PO TABS
650.0000 mg | ORAL_TABLET | Freq: Four times a day (QID) | ORAL | Status: DC | PRN
  Administered 2023-08-18 – 2023-08-22 (×3): 650 mg via ORAL
  Filled 2023-08-17 (×3): qty 2

## 2023-08-17 MED ORDER — SENNA 8.6 MG PO TABS
1.0000 | ORAL_TABLET | Freq: Two times a day (BID) | ORAL | Status: DC
  Administered 2023-08-17 – 2023-08-18 (×2): 8.6 mg via ORAL
  Filled 2023-08-17 (×3): qty 1

## 2023-08-17 MED ORDER — LORAZEPAM 2 MG/ML IJ SOLN: 1.0000 mg | INTRAMUSCULAR | Status: DC | PRN

## 2023-08-17 MED ORDER — POLYVINYL ALCOHOL 1.4 % OP SOLN: 1.0000 [drp] | OPHTHALMIC | Status: DC | PRN

## 2023-08-17 MED ORDER — QUETIAPINE FUMARATE 25 MG PO TABS
25.0000 mg | ORAL_TABLET | Freq: Every day | ORAL | Status: DC
  Administered 2023-08-17 – 2023-08-18 (×2): 25 mg via ORAL
  Filled 2023-08-17 (×2): qty 1

## 2023-08-17 MED ORDER — ASPIRIN 81 MG PO TBEC
81.0000 mg | DELAYED_RELEASE_TABLET | Freq: Every day | ORAL | Status: DC
  Administered 2023-08-17 – 2023-08-25 (×8): 81 mg via ORAL
  Filled 2023-08-17 (×9): qty 1

## 2023-08-17 MED ORDER — DEXAMETHASONE SODIUM PHOSPHATE 10 MG/ML IJ SOLN
10.0000 mg | Freq: Once | INTRAMUSCULAR | Status: AC
  Administered 2023-08-17: 10 mg via INTRAMUSCULAR
  Filled 2023-08-17: qty 1

## 2023-08-17 MED ORDER — AZITHROMYCIN 250 MG PO TABS
500.0000 mg | ORAL_TABLET | Freq: Once | ORAL | Status: AC
  Administered 2023-08-17: 500 mg via ORAL
  Filled 2023-08-17: qty 2

## 2023-08-17 MED ORDER — HYDRALAZINE HCL 20 MG/ML IJ SOLN
10.0000 mg | Freq: Four times a day (QID) | INTRAMUSCULAR | Status: DC | PRN
  Administered 2023-08-20 (×2): 10 mg via INTRAVENOUS
  Filled 2023-08-17 (×2): qty 1

## 2023-08-17 MED ORDER — DEXAMETHASONE SODIUM PHOSPHATE 10 MG/ML IJ SOLN: 10.0000 mg | Freq: Once | INTRAMUSCULAR | Status: DC

## 2023-08-17 MED ORDER — ALBUTEROL SULFATE (2.5 MG/3ML) 0.083% IN NEBU
2.5000 mg | INHALATION_SOLUTION | Freq: Four times a day (QID) | RESPIRATORY_TRACT | Status: DC | PRN
  Administered 2023-08-18: 2.5 mg via RESPIRATORY_TRACT
  Filled 2023-08-17: qty 3

## 2023-08-17 MED ORDER — FINASTERIDE 5 MG PO TABS
5.0000 mg | ORAL_TABLET | Freq: Every day | ORAL | Status: DC
  Administered 2023-08-18 – 2023-08-25 (×8): 5 mg via ORAL
  Filled 2023-08-17 (×8): qty 1

## 2023-08-17 MED ORDER — MELATONIN 3 MG PO TABS
3.0000 mg | ORAL_TABLET | Freq: Every day | ORAL | Status: DC
  Administered 2023-08-17 – 2023-08-20 (×4): 3 mg via ORAL
  Filled 2023-08-17 (×4): qty 1

## 2023-08-17 MED ORDER — HALOPERIDOL LACTATE 5 MG/ML IJ SOLN
2.0000 mg | Freq: Once | INTRAMUSCULAR | Status: AC
  Administered 2023-08-17: 2 mg via INTRAVENOUS
  Filled 2023-08-17: qty 1

## 2023-08-17 MED ORDER — PANTOPRAZOLE SODIUM 40 MG PO TBEC
80.0000 mg | DELAYED_RELEASE_TABLET | Freq: Every day | ORAL | Status: DC
  Administered 2023-08-17 – 2023-08-25 (×9): 80 mg via ORAL
  Filled 2023-08-17 (×9): qty 2

## 2023-08-17 MED ORDER — AZITHROMYCIN 250 MG PO TABS: 250.0000 mg | ORAL_TABLET | Freq: Every day | ORAL | 0 refills | Status: DC

## 2023-08-17 MED ORDER — CIPROFLOXACIN HCL 0.3 % OP SOLN
1.0000 [drp] | OPHTHALMIC | Status: AC
  Administered 2023-08-17 – 2023-08-22 (×22): 1 [drp] via OPHTHALMIC
  Filled 2023-08-17: qty 2.5

## 2023-08-17 NOTE — ED Notes (Signed)
Pt has been sleeping since giving the haldol. He will awaken but it is hard for him to follow instructions. Pt stated he had to pee but he had already urinated all over the bed. Bedding changed and patient cleaned. He would not roll over on his own so this paramedic and tech had to pull him over to change the sheets.

## 2023-08-17 NOTE — ED Notes (Signed)
Patient continues to take himself off monitor.

## 2023-08-17 NOTE — ED Notes (Signed)
Patient family reports patient has been having hallucinations since being on Parkinson's medications. Family reports it appears that hallucinations have been worse since taking congestion medication.

## 2023-08-17 NOTE — ED Notes (Signed)
This paramedic called staffing for a sitter and was told by Dondra Spry that they do not have one available at this time and that possibly there could be one coming in at 21 but they are not sure.. EDP made aware

## 2023-08-17 NOTE — ED Provider Notes (Signed)
[place] EMERGENCY DEPARTMENT AT [facility] Provider Note   CSN: [id number] Arrival date & time: 08/16/23  1446     History  Chief Complaint  Patient presents with   Dizziness   HPI Karl Lawson. is a 77 y.o. male with history of Parkinson's, COPD, type 2 diabetes, hyperlipidemia, hypertension presenting for dizziness.  Started this past Friday.  Has been intermittent.  Endorses loss of balance and possibly some visual field loss in the left eye.  States he is also has a cough since Friday.  States his wife is in a nursing home and also had a cough.  States now has had some unusual pustulant discharge in the right eye and now in the left eye along with some nasal congestion.  Denies fever at home.  Endorses some shortness of breath but no chest pain.  His son states that he has had hallucinations since he started his new Parkinson's medication months ago but states that they had been worse in the last few days.  Believes that it could be related to his taking cough medicine.  His son advised him to stop the cough medicine on Saturday and symptoms improved.   Dizziness      Home Medications Prior to Admission medications   Medication Sig Start Date End Date Taking? Authorizing Provider  azithromycin (ZITHROMAX) 250 MG tablet Take 1 tablet (250 mg total) by mouth daily. Take first 2 tablets together, then 1 every day until finished. 08/17/23  Yes Gareth Eagle, PA-C  acetaminophen (TYLENOL) 500 MG tablet Take 500 mg by mouth every 6 (six) hours as needed for moderate pain.    [provider]  aspirin EC 81 MG tablet Take 81 mg by mouth daily.    [provider]  Blood Glucose Monitoring Suppl (ONE TOUCH ULTRA 2) w/Device KIT Check blood glucose 3 times daily as directed    [provider]  carbidopa-levodopa (SINEMET IR) 25-100 MG tablet TAKE 1.5 TABLETS BY MOUTH 3 (THREE) TIMES DAILY. 6AM/10-11AM/3-4PM 08/16/23   Tat, Octaviano Batty, DO   clotrimazole-betamethasone (LOTRISONE) cream Apply 1 Application topically 2 (two) times daily as needed (yeast). 09/08/22   [provider]  finasteride (PROSCAR) 5 MG tablet Take 1 tablet (5 mg total) by mouth daily. 05/10/23   Mliss Sax, MD  Insulin Aspart Prot & Aspart (NOVOLOG MIX 70/30 [place]) Inject 30 Units into the skin 2 (two) times daily.    [provider]  JARDIANCE 10 MG TABS tablet Take 10 mg by mouth daily. 04/03/20   [provider]  losartan (COZAAR) 100 MG tablet Take 1 tablet (100 mg total) by mouth daily. 06/13/23   Mliss Sax, MD  meloxicam (MOBIC) 15 MG tablet Take 1 tablet (15 mg total) by mouth daily. 02/13/23   Lenda Kelp, MD  metFORMIN (GLUCOPHAGE) 1000 MG tablet Take 1 tablet (1,000 mg total) by mouth 2 (two) times daily with a meal. 05/26/22   Corky Crafts, MD  Multiple Vitamins-Minerals (PRESERVISION AREDS 2 PO) Take 1 capsule by mouth in the morning and at bedtime.    [provider]  mupirocin ointment (BACTROBAN) 2 % Apply 1 Application topically 2 (two) times daily. 05/22/23   Carlisle Beers, FNP  omeprazole (PRILOSEC) 40 MG capsule Take 1 capsule (40 mg total) by mouth daily. 05/10/23   Mliss Sax, MD  [facility] ULTRA test strip  08/11/20   [provider]  simvastatin (ZOCOR) 40 MG  tablet Take 1 tablet (40 mg total) by mouth at bedtime. 07/30/19   Dianne Dun, MD      Allergies    Patient has no known allergies.    Review of Systems   Review of Systems  Neurological:  Positive for dizziness.    Physical Exam   Vitals:   08/17/23 0546 08/17/23 0600  BP:  (!) 149/63  Pulse: 92 72  Resp: 18 18  Temp:    SpO2: 94% 94%    CONSTITUTIONAL:  well-appearing, NAD, coughing NEURO:  GCS 15. Speech is goal oriented. No deficits appreciated to CN III-XII; symmetric eyebrow raise, no facial drooping, tongue midline. Patient has equal grip strength bilaterally with 5/5 strength  against resistance in all major muscle groups bilaterally. Sensation to light touch intact. Patient moves extremities without ataxia. Normal finger-nose-finger.  Tremor noted in both arms, R> L.  EYES:  eyes equal and reactive, pustulant discharge noted in both eyes about the conjunctivo and eyelids.  Both eyes appear injected. ENT/NECK:  Supple, no stridor  CARDIO:  Regular rate and rhythm, appears well-perfused  PULM:  No respiratory distress, mildly coarse, no crackles, no wheezing GI/GU:  non-distended MSK/SPINE:  No gross deformities, no edema, moves all extremities  SKIN:  no rash, atraumatic  *Additional and/or pertinent findings included in MDM below   ED Results / Procedures / Treatments   Labs (all labs ordered are listed, but only abnormal results are displayed) Labs Reviewed  BASIC METABOLIC PANEL - Abnormal; Notable for the following components:      Result Value   Glucose, Bld 161 (*)    GFR, Estimated 60 (*)    All other components within normal limits  CBC - Abnormal; Notable for the following components:   WBC 10.8 (*)    Hemoglobin 12.8 (*)    RDW 15.9 (*)    All other components within normal limits  URINALYSIS, ROUTINE W REFLEX MICROSCOPIC - Abnormal; Notable for the following components:   Glucose, UA >=500 (*)    Hgb urine dipstick SMALL (*)    Ketones, ur 80 (*)    Protein, ur >=300 (*)    All other components within normal limits  CBG MONITORING, ED - Abnormal; Notable for the following components:   Glucose-Capillary 163 (*)    All other components within normal limits  CBG MONITORING, ED - Abnormal; Notable for the following components:   Glucose-Capillary 144 (*)    All other components within normal limits  RESP PANEL BY RT-PCR (RSV, FLU A&B, COVID)  [id number]    EKG EKG Interpretation Date/Time:  Thursday August 17 2023 01:42:36 EDT Ventricular Rate:  81 PR Interval:    QRS Duration:  152 QT Interval:  426 QTC Calculation: 495 R  Axis:   -74  Text Interpretation: Atrial fibrillation Nonspecific IVCD with LAD Left ventricular hypertrophy Anterior Q waves, possibly due to LVH No prior for comparison Occasional P waves identified Confirmed by Ross Marcus ([id number]) on 08/17/2023 1:58:37 AM  Radiology MR Brain Wo Contrast (neuro protocol)  Result Date: 08/17/2023 CLINICAL DATA:  Initial evaluation for neuro deficit, stroke suspected. EXAM: MRI HEAD WITHOUT CONTRAST TECHNIQUE: Multiplanar, multiecho pulse sequences of the brain and surrounding structures were obtained without intravenous contrast. COMPARISON:  None Available. FINDINGS: Brain: Examination moderately degraded by motion. Generalized age-related cerebral atrophy. Patchy T2/FLAIR hyperintensity involving the periventricular and deep white matter, most likely related chronic microvascular ischemic disease, mild for age. No evidence for acute or subacute ischemia.  No visible areas of chronic cortical infarction. No visible acute or chronic intracranial blood products, although the SWI sequences are severely degraded by motion. No mass lesion, midline shift or mass effect. No hydrocephalus or extra-axial fluid collection. Pituitary gland suprasellar region within normal limits. Vascular: Major intracranial vascular flow voids are maintained. Skull and upper cervical spine: Craniocervical junction within normal limits. Bone marrow signal intensity normal. No scalp soft tissue abnormality. Sinuses/Orbits: Prior bilateral ocular lens replacement. Moderate mucosal thickening present throughout the paranasal sinuses. No mastoid effusion. Other: None. IMPRESSION: 1. Motion degraded exam. 2. No acute intracranial abnormality. 3. Age-related cerebral atrophy with mild chronic small vessel ischemic disease. Electronically Signed   By: Rise Mu M.D.   On: 08/17/2023 04:11   DG Chest Portable 1 View  Result Date: 08/17/2023 CLINICAL DATA:  Shortness of breath, dizziness.  EXAM: PORTABLE CHEST 1 VIEW COMPARISON:  02/03/2022. FINDINGS: The heart size and mediastinal contours are within normal limits. There is atherosclerotic calcification of the aorta. Lung volumes are low. No consolidation, effusion, or pneumothorax. No acute osseous abnormality. IMPRESSION: No active disease. Electronically Signed   By: Thornell Sartorius M.D.   On: 08/17/2023 02:14    Procedures Procedures    Medications Ordered in ED Medications  cefTRIAXone (ROCEPHIN) 1 g in sodium chloride 0.9 % 100 mL IVPB (has no administration in time range)  azithromycin (ZITHROMAX) tablet 500 mg (has no administration in time range)  albuterol (PROVENTIL) (2.5 MG/3ML) 0.083% nebulizer solution (10 mg/hr Nebulization Given 08/17/23 0545)  ipratropium (ATROVENT) nebulizer solution 0.5 mg (0.5 mg Nebulization Given 08/17/23 0545)  dexamethasone (DECADRON) injection 10 mg (10 mg Intramuscular Given 08/17/23 9528)    ED Course/ Medical Decision Making/ A&P Clinical Course as of 08/17/23 4132  Thu Aug 17, 2023  0524 MCHC: 31.9 [JR]    Clinical Course User Index [JR] Gareth Eagle, PA-C                                 Medical Decision Making Amount and/or Complexity of Data Reviewed Labs: ordered. Decision-making details documented in ED Course. Radiology: ordered.  Risk Prescription drug management.   Initial Impression and Ddx 77 year old well-appearing male presenting for dizziness and cough.  Exam notable for coarse breath sounds and cough but otherwise reassuring.  DDx includes stroke, electrolyte derangement, UTI, sepsis, arrhythmia, pneumonia, other. Patient PMH that increases complexity of ED encounter:  Parkinson's, COPD, type 2 diabetes, hyperlipidemia, hypertension   Interpretation of Diagnostics - I independent reviewed and interpreted the labs as followed: Leukocytosis, hematuria  - I independently visualized the following imaging with scope of interpretation limited to  determining acute life threatening conditions related to emergency care: MR brain, which revealed no acute abnormality. CXR no acute findings.  -I personally reviewed and interpreted EKG which initially revealed concern for A-fib but EKG was done again and revealed normal sinus rhythm  Patient Reassessment and Ultimate Disposition/Management On reassessment, patient notably still coughing and coarse breath sounds.  Given history of COPD, when treated for COPD exacerbation and treated empirically for CAP with Rocephin and azithromycin.  MRI was unremarkable.  Overall patient appears clinically well, no acute distress and hemodynamically stable.  Plan at this time is to finish his breathing treatment, reassess.  If patient can ambulate to maintain sats without evidence of respiratory distress, patient could be considered for discharge on antibiotics for community-acquired pneumonia.  Would also recommend a follow-up  with his PCP. Signed out patient to PA Amjad Karie Mainland.  Patient management required discussion with the following services or consulting groups:  None  Complexity of Problems Addressed Acute complicated illness or Injury  Additional Data Reviewed and Analyzed Further history obtained from: Further history from spouse/family member, Past medical history and medications listed in the EMR, and Prior ED visit notes  Patient Encounter Risk Assessment Prescriptions and Consideration of hospitalization         Final Clinical Impression(s) / ED Diagnoses Final diagnoses:  Dizziness  Cough, unspecified type    Rx / DC Orders ED Discharge Orders          Ordered    azithromycin (ZITHROMAX) 250 MG tablet  Daily        08/17/23 0640              Gareth Eagle, PA-C 08/17/23 1478    Shon Baton, MD 08/18/23 0532

## 2023-08-17 NOTE — ED Notes (Signed)
Patient still continuing to take himself off monitor.

## 2023-08-17 NOTE — ED Notes (Addendum)
Pt pulled out his IV. Pt did not know he did. He is moving all around in the bed and was trying to reach for the nebulizer attached to the wall to "turn it off" pt then proceeds to ask this paramedic if we "have removed the radiation."

## 2023-08-17 NOTE — ED Provider Notes (Signed)
Signout received on this 77 year old male who is pending reevaluation and impression from.  Spoke to son for additional collateral.  Who states patient's hallucinations have been worsening since his URI symptoms started on Saturday.  He believes may be DayQuil, NyQuil, or Robitussin is playing a role.  He states hallucinations have become severe and he is starting to see people that are not there and also developed delusions.  He states conjunctivitis yesterday.  Physical Exam  BP (!) 161/48   Pulse 71   Temp 99.8 F (37.7 C) (Oral)   Resp 16   Ht 5\' 8"  (1.727 m)   Wt 95.3 kg   SpO2 97%   BMI 31.93 kg/m     Procedures  Procedures  ED Course / MDM   Clinical Course as of 08/17/23 1136  Thu Aug 17, 2023  0524 MCHC: 31.9 [JR]    Clinical Course User Index [JR] Gareth Eagle, PA-C   Medical Decision Making Amount and/or Complexity of Data Reviewed Labs: ordered. Decision-making details documented in ED Course. Radiology: ordered.  Risk Prescription drug management. Decision regarding hospitalization.   Patient remains, and delusional.  Discussed with hospitalist will evaluate patient for admission.       Marita [place], PA-C 08/17/23 1149    Alvira Monday, MD 08/17/23 2240

## 2023-08-17 NOTE — H&P (Signed)
[facility] History and Physical  Karl Lawson. GEX:[id number] DOB: [birth year] DOA: 08/16/2023 PCP: Mliss Sax, MD  Presented from: Home Chief Complaint: Altered mental status  History of Present Illness: Karl Bertram. is a 77 y.o. male with PMH significant for obesity, DM2, HTN, HLD, COPD, GERD, Parkinson disease. 10/9, patient was brought to the ED with complaint of altered mental status.  History received from the son.  Patient has Parkinson's disease and has been on Sinemet for the last 1 year.  At baseline, he is physically independent and lives alone.  His son checks on him.  Mental status mostly intact but at times he has hallucinations which the son believes is getting worse since Sinemet was started a year ago.  Patient however remains compliant to it.   His wife is in a nursing home and had a cough when he visited her last week.  Patient also developed a cough starting Friday 10/4.  Over the next few days he also developed congested eyes with purulent drainage.  Saturday, patient started taking Robitussin after which his hallucination worsened.  He then switched to DayQuil and NyQuil.  He has not been able to sleep for last 2 nights.  He started hallucinating significantly and hence he was brought to the ED yesterday. While waited in the ED triage for several hours last night and became increasingly more confused.  In the ED, patient was afebrile, heart rate in 60s, blood pressure 163/64, breathing on room air Initial labs with WBC count 10.8, hemoglobin 12.8, sodium 137, glucose 161, BUN/creatinine 20/1.24 Respiratory virus panel negative Urinalysis showed clear yellow urine with negative leukocytes, negative nitrate, no bacteria Urine drug screen negative Chest x-ray unremarkable MRI brain did not show any acute intracranial abnormality.  It showed age-related cerebral atrophy and mild chronic ischemic small vessel disease. Initial EKG showed A-fib at  the rate of 81 bpm, QTc 495 ms  Patient was given broad-spectrum antibiotic coverage with IV Rocephin, IV azithromycin and also dose of Decadron 10 mg. Patient was monitored overnight in the ED.  His mental status continued to worsen as the night past.  He was getting restless, pulling out lines and was at risk of fall.   Patient was given a dose of Haldol earlier this morning. Due to his persistent altered mental status, hospitalist service was consulted for inpatient management.  At the time of my evaluation, patient was somnolent.  He had just received Haldol 2 mg in the last 1 hour. Later, I met with his son Karl Lawson at bedside. History reviewed and detailed as above.   Review of Systems:  All systems were reviewed and were negative unless otherwise mentioned in the HPI   Past medical history: Past Medical History:  Diagnosis Date   CAP (community acquired pneumonia) 1968   [place]   Cataract    COPD (chronic obstructive pulmonary disease) (HCC)    Diabetes mellitus    Diverticulosis of colon 11/19/2012   left colon, Dr Leone Payor   GERD (gastroesophageal reflux disease)    History of adenomatous polyps of colon - probable attenuated polyposis 07/17/2008   2004: 2 polyps max 8 mm 1 lost and 1 infammatory 2010: 7 polyps TV and tubular adenomas max 6 mm 11/19/2012 :4 polyps; max 1 cm removed; all adenomas 01/05/2017 8 polyps max 10 mm adenomas recall 2021     Hyperlipidemia    Hypertension    Lumbago    buldging disc  L4-5   Personal history of adenomatous colonic polyps 07/17/2008   2004 2 polyps max 8 mm (adenomas) 2010 7 polyps TV and tubular adenomas max 6 mm 11/19/2012      Past surgical history: Past Surgical History:  Procedure Laterality Date   APPENDECTOMY  1959   CATARACT EXTRACTION Right    COLONOSCOPY  multiple   adenomatous polyps, Dr Leone Payor   ESOPHAGOGASTRODUODENOSCOPY  multiple   INGUINAL HERNIA REPAIR Left 1997   INGUINAL HERNIA REPAIR  Bilateral 10/24/2022   Procedure: LAPAROSCOPIC BILATERAL INGUINAL HERNIA REPAIR WITH MESH;  Surgeon: Axel Filler, MD;  Location: [facility] OR;  Service: General;  Laterality: Bilateral;   LEFT HEART CATH AND CORONARY ANGIOGRAPHY N/A 05/24/2022   Procedure: LEFT HEART CATH AND CORONARY ANGIOGRAPHY;  Surgeon: Corky Crafts, MD;  Location: [facility] INVASIVE CV LAB;  Service: Cardiovascular;  Laterality: N/A;   TONSILLECTOMY      Social History:  reports that he quit smoking about 10 years ago. His smoking use included cigarettes and e-cigarettes. He has never used smokeless tobacco. He reports current alcohol use. He reports that he does not use drugs.  Allergies:  No Known Allergies Patient has no known allergies.   Family history:  Family History  Problem Relation Age of Onset   Alzheimer's disease Mother    Heart disease Father        CHF   Stroke Father 36   Tremor Father    Tremor Sister    Pulmonary embolism Brother    Diabetes Brother    Heart disease Paternal Grandfather    Asthma Neg Hx    Colon cancer Neg Hx    Esophageal cancer Neg Hx    Pancreatic cancer Neg Hx    Prostate cancer Neg Hx    Rectal cancer Neg Hx    Stomach cancer Neg Hx      Physical Exam: Vitals:   08/17/23 0724 08/17/23 0900 08/17/23 1119 08/17/23 1145  BP: (!) 138/37  (!) 161/48 (!) 162/38  Pulse: 80  71 69  Resp: 16     Temp: 99.1 F (37.3 C) 99.8 F (37.7 C)    TempSrc: Oral Oral    SpO2: 96%  97% 96%  Weight:      Height:       Wt Readings from Last 3 Encounters:  08/16/23 95.3 kg  07/18/23 97.7 kg  07/03/23 97.5 kg   Body mass index is 31.93 kg/m.  General exam: elderly Caucasian male.  Somnolent under the effect of Haldol.  Intermittent myoclonic jerks noted Skin: No rashes, lesions or ulcers. HEENT: Atraumatic, normocephalic, no obvious bleeding.  Mild purulence and congestion noted on both eyes Lungs: Clear to auscultation bilaterally CVS: Regular rate and rhythm, no  murmur GI/Abd soft, nontender, nondistended, bowel sound present CNS: Sedated state under the effect of Haldol Psychiatry: Unable to examine because of somnolence Extremities: No pedal edema, no calf tenderness  ------------------------------------------------------------------------------------------------------ Assessment/Plan: Principal Problem:   Acute delirium Active Problems:   Acute metabolic encephalopathy  Acute metabolic encephalopathy Presented with worsening delirium, hallucinations in the setting of  esity, DM2, HTN, HLD, COPD, GERD, Parkinson disease. 10/9, patient was brought to the ED with complaint of altered mental status Likely due to the use of Robitussin, DayQuil, NyQuil and loss of sleep in the setting of underlying Parkinson's disease, chronic ischemic changes as noted in CT scan Extensive workup done.  MRI without acute findings.  No evidence of infection.  Urine drug screen unremarkable. Stop  antitussives Given 1 dose of IV Haldol earlier.  Try to avoid Haldol because of prolonged Qtc Order for IV Ativan as needed  H/o Parkinson's disease Continue Sinemet  URI Purulent conjunctivitis Has congested eyes, cough since last week. Respiratory virus panel unremarkable. start ciprofloxacin eyedrops  Type 2 diabetes mellitus A1c 6.1 on 06/13/2023 PTA meds-70/30 insulin twice daily, metformin, Jardiance Started SSI/Accu-Cheks Recent Labs  Lab 08/16/23 1542 08/16/23 2217 08/17/23 1101 08/17/23 1232  GLUCAP 163* 144* 180* 178*   Hypertension Continue monitor blood pressure.  IV hydralazine as needed Resume losartan when able to  HLD Resume simvastatin when able to  COPD Bronchodilators as needed  GERD PPI  BPH Finasteride  Home med list has not been obtained/updated by [facility] at the time of admission.  Please double check admission reconciliation.  Mobility: Once mental status improved, encourage ambulation.  May need PT eval  Goals of  care   Code Status: Full Code    DVT prophylaxis:  SCDs Start: 08/17/23 1115   Antimicrobials: Ciprofloxacin eyedrops Fluid: None Consultants: None Family Communication: Son at bedside  Dispo: The patient is from: Home              Anticipated d/c is to: Home  Diet: Diet Order             Diet Carb Modified Fluid consistency: Thin; Room service appropriate? Yes  Diet effective now                    ------------------------------------------------------------------------------------- Severity of Illness: The appropriate patient status for this patient is OBSERVATION. Observation status is judged to be reasonable and necessary in order to provide the required intensity of service to ensure the patient's safety. The patient's presenting symptoms, physical exam findings, and initial radiographic and laboratory data in the context of their medical condition is felt to place them at decreased risk for further clinical deterioration. Furthermore, it is anticipated that the patient will be medically stable for discharge from the hospital within 2 midnights of admission.  -------------------------------------------------------------------------------------  Home Meds: Prior to Admission medications   Medication Sig Start Date End Date Taking? Authorizing Provider  azithromycin (ZITHROMAX) 250 MG tablet Take 1 tablet (250 mg total) by mouth daily. Take first 2 tablets together, then 1 every day until finished. 08/17/23  Yes Gareth Eagle, PA-C  acetaminophen (TYLENOL) 500 MG tablet Take 500 mg by mouth every 6 (six) hours as needed for moderate pain.    [provider]  aspirin EC 81 MG tablet Take 81 mg by mouth daily.    [provider]  Blood Glucose Monitoring Suppl (ONE TOUCH ULTRA 2) w/Device KIT Check blood glucose 3 times daily as directed    [provider]  carbidopa-levodopa (SINEMET IR) 25-100 MG tablet TAKE 1.5 TABLETS BY MOUTH 3 (THREE) TIMES  DAILY. 6AM/10-11AM/3-4PM 08/16/23   Tat, Octaviano Batty, DO  clotrimazole-betamethasone (LOTRISONE) cream Apply 1 Application topically 2 (two) times daily as needed (yeast). 09/08/22   [provider]  finasteride (PROSCAR) 5 MG tablet Take 1 tablet (5 mg total) by mouth daily. 05/10/23   Mliss Sax, MD  Insulin Aspart Prot & Aspart (NOVOLOG MIX 70/30 [place]) Inject 30 Units into the skin 2 (two) times daily.    [provider]  JARDIANCE 10 MG TABS tablet Take 10 mg by mouth daily. 04/03/20   [provider]  losartan (COZAAR) 100 MG tablet Take 1 tablet (100 mg total) by mouth daily. 06/13/23  Mliss Sax, MD  meloxicam (MOBIC) 15 MG tablet Take 1 tablet (15 mg total) by mouth daily. 02/13/23   Lenda Kelp, MD  metFORMIN (GLUCOPHAGE) 1000 MG tablet Take 1 tablet (1,000 mg total) by mouth 2 (two) times daily with a meal. 05/26/22   Corky Crafts, MD  Multiple Vitamins-Minerals (PRESERVISION AREDS 2 PO) Take 1 capsule by mouth in the morning and at bedtime.    [provider]  mupirocin ointment (BACTROBAN) 2 % Apply 1 Application topically 2 (two) times daily. 05/22/23   Carlisle Beers, FNP  omeprazole (PRILOSEC) 40 MG capsule Take 1 capsule (40 mg total) by mouth daily. 05/10/23   Mliss Sax, MD  [facility] ULTRA test strip  08/11/20   [provider]  simvastatin (ZOCOR) 40 MG tablet Take 1 tablet (40 mg total) by mouth at bedtime. 07/30/19   Dianne Dun, MD    Labs on Admission:   CBC: Recent Labs  Lab 08/16/23 1517 08/17/23 0831  WBC 10.8* 8.9  NEUTROABS  --  7.2  HGB 12.8* 12.5*  HCT 40.1 39.9  MCV 82.3 83.0  PLT 249 244    Basic Metabolic Panel: Recent Labs  Lab 08/16/23 1517 08/17/23 0840  NA 137 140  K 3.7 3.6  CL 101 102  CO2 22 16*  GLUCOSE 161* 199*  BUN 20 24*  CREATININE 1.24 1.26*  CALCIUM 10.0 9.9    Liver Function Tests: Recent Labs  Lab 08/17/23 0840  AST 23  ALT 16   ALKPHOS 61  BILITOT 2.1*  PROT 6.2*  ALBUMIN 3.3*   No results for input(s): "LIPASE", "AMYLASE" in the last 168 hours. Recent Labs  Lab 08/17/23 0830  AMMONIA 32    Cardiac Enzymes: No results for input(s): "CKTOTAL", "CKMB", "CKMBINDEX", "TROPONINI" in the last 168 hours.  BNP (last 3 results) No results for input(s): "BNP" in the last 8760 hours.  ProBNP (last 3 results) No results for input(s): "PROBNP" in the last 8760 hours.  CBG: Recent Labs  Lab 08/16/23 1542 08/16/23 2217 08/17/23 1101  GLUCAP 163* 144* 180*    Lipase  No results found for: "LIPASE"   Urinalysis    Component Value Date/Time   COLORURINE YELLOW 08/17/2023 0420   APPEARANCEUR CLEAR 08/17/2023 0420   LABSPEC 1.026 08/17/2023 0420   PHURINE 5.0 08/17/2023 0420   GLUCOSEU >=500 (A) 08/17/2023 0420   GLUCOSEU >=1000 (A) 07/18/2023 1156   HGBUR SMALL (A) 08/17/2023 0420   BILIRUBINUR NEGATIVE 08/17/2023 0420   KETONESUR 80 (A) 08/17/2023 0420   PROTEINUR >=300 (A) 08/17/2023 0420   UROBILINOGEN 0.2 07/18/2023 1156   NITRITE NEGATIVE 08/17/2023 0420   LEUKOCYTESUR NEGATIVE 08/17/2023 0420     Drugs of Abuse     Component Value Date/Time   LABOPIA NONE DETECTED 08/17/2023 0821   COCAINSCRNUR NONE DETECTED 08/17/2023 0821   LABBENZ NONE DETECTED 08/17/2023 0821   AMPHETMU NONE DETECTED 08/17/2023 0821   THCU NONE DETECTED 08/17/2023 0821   LABBARB NONE DETECTED 08/17/2023 6213      Radiological Exams on Admission: MR Brain Wo Contrast (neuro protocol)  Result Date: 08/17/2023 CLINICAL DATA:  Initial evaluation for neuro deficit, stroke suspected. EXAM: MRI HEAD WITHOUT CONTRAST TECHNIQUE: Multiplanar, multiecho pulse sequences of the brain and surrounding structures were obtained without intravenous contrast. COMPARISON:  None Available. FINDINGS: Brain: Examination moderately degraded by motion. Generalized age-related cerebral atrophy. Patchy T2/FLAIR hyperintensity involving the  periventricular and deep white matter, most likely related  chronic microvascular ischemic disease, mild for age. No evidence for acute or subacute ischemia. No visible areas of chronic cortical infarction. No visible acute or chronic intracranial blood products, although the SWI sequences are severely degraded by motion. No mass lesion, midline shift or mass effect. No hydrocephalus or extra-axial fluid collection. Pituitary gland suprasellar region within normal limits. Vascular: Major intracranial vascular flow voids are maintained. Skull and upper cervical spine: Craniocervical junction within normal limits. Bone marrow signal intensity normal. No scalp soft tissue abnormality. Sinuses/Orbits: Prior bilateral ocular lens replacement. Moderate mucosal thickening present throughout the paranasal sinuses. No mastoid effusion. Other: None. IMPRESSION: 1. Motion degraded exam. 2. No acute intracranial abnormality. 3. Age-related cerebral atrophy with mild chronic small vessel ischemic disease. Electronically Signed   By: Rise Mu M.D.   On: 08/17/2023 04:11   DG Chest Portable 1 View  Result Date: 08/17/2023 CLINICAL DATA:  Shortness of breath, dizziness. EXAM: PORTABLE CHEST 1 VIEW COMPARISON:  02/03/2022. FINDINGS: The heart size and mediastinal contours are within normal limits. There is atherosclerotic calcification of the aorta. Lung volumes are low. No consolidation, effusion, or pneumothorax. No acute osseous abnormality. IMPRESSION: No active disease. Electronically Signed   By: Thornell Sartorius M.D.   On: 08/17/2023 02:14     Signed, Lorin Glass, MD [facility] 08/17/2023

## 2023-08-17 NOTE — ED Notes (Signed)
Pt has yellow discharge coming from both eyes. Unable to answer "where are we" States he is at home about to get some "water." Then proceeds to ask me if I see the "dead fish" PT refuses to leave any monitoring on. He is moving around constantly in the bed fidgeting with everything.

## 2023-08-17 NOTE — ED Notes (Signed)
Bed alarm not available

## 2023-08-17 NOTE — ED Notes (Signed)
Patient is restless at this time. Patient trying to grab stuff off the floor that is not there.

## 2023-08-17 NOTE — ED Notes (Signed)
Pt resting comfortably at this time, son at bedside.

## 2023-08-17 NOTE — Progress Notes (Signed)
Patient arrived to room 6E20. VS stable, patient free from pain. Family at bedside, upper and lower dentures and clothes at bedside.

## 2023-08-17 NOTE — ED Notes (Signed)
Pt tying to get out of bed. Has urinated all over himself.

## 2023-08-17 NOTE — ED Notes (Signed)
Patient transported to MRI 

## 2023-08-18 ENCOUNTER — Encounter (HOSPITAL_COMMUNITY)
Admission: EM | Disposition: A | Discharge status: Skilled Nursing Facility | Payer: Self-pay | Source: Home / Self Care | Attending: Internal Medicine

## 2023-08-18 ENCOUNTER — Ambulatory Visit: Payer: Medicare Other | Admitting: Family Medicine

## 2023-08-18 ENCOUNTER — Inpatient Hospital Stay (HOSPITAL_COMMUNITY): Payer: Medicare Other

## 2023-08-18 DIAGNOSIS — R42 Dizziness and giddiness: Secondary | ICD-10-CM

## 2023-08-18 DIAGNOSIS — Z87891 Personal history of nicotine dependence: Secondary | ICD-10-CM | POA: Diagnosis not present

## 2023-08-18 DIAGNOSIS — E669 Obesity, unspecified: Secondary | ICD-10-CM | POA: Diagnosis present

## 2023-08-18 DIAGNOSIS — G20A1 Parkinson's disease without dyskinesia, without mention of fluctuations: Secondary | ICD-10-CM | POA: Diagnosis present

## 2023-08-18 DIAGNOSIS — N179 Acute kidney failure, unspecified: Secondary | ICD-10-CM | POA: Diagnosis present

## 2023-08-18 DIAGNOSIS — I951 Orthostatic hypotension: Secondary | ICD-10-CM | POA: Diagnosis present

## 2023-08-18 DIAGNOSIS — Z6831 Body mass index (BMI) 31.0-31.9, adult: Secondary | ICD-10-CM | POA: Diagnosis not present

## 2023-08-18 DIAGNOSIS — J690 Pneumonitis due to inhalation of food and vomit: Secondary | ICD-10-CM | POA: Diagnosis not present

## 2023-08-18 DIAGNOSIS — F05 Delirium due to known physiological condition: Secondary | ICD-10-CM | POA: Diagnosis not present

## 2023-08-18 DIAGNOSIS — R410 Disorientation, unspecified: Secondary | ICD-10-CM | POA: Diagnosis not present

## 2023-08-18 DIAGNOSIS — I10 Essential (primary) hypertension: Secondary | ICD-10-CM | POA: Diagnosis present

## 2023-08-18 DIAGNOSIS — I4891 Unspecified atrial fibrillation: Secondary | ICD-10-CM | POA: Diagnosis present

## 2023-08-18 DIAGNOSIS — I441 Atrioventricular block, second degree: Secondary | ICD-10-CM

## 2023-08-18 DIAGNOSIS — H109 Unspecified conjunctivitis: Secondary | ICD-10-CM | POA: Diagnosis present

## 2023-08-18 DIAGNOSIS — I169 Hypertensive crisis, unspecified: Secondary | ICD-10-CM | POA: Diagnosis not present

## 2023-08-18 DIAGNOSIS — N400 Enlarged prostate without lower urinary tract symptoms: Secondary | ICD-10-CM | POA: Diagnosis present

## 2023-08-18 DIAGNOSIS — Z794 Long term (current) use of insulin: Secondary | ICD-10-CM | POA: Diagnosis not present

## 2023-08-18 DIAGNOSIS — E113593 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, bilateral: Secondary | ICD-10-CM | POA: Diagnosis present

## 2023-08-18 DIAGNOSIS — R059 Cough, unspecified: Secondary | ICD-10-CM

## 2023-08-18 DIAGNOSIS — I442 Atrioventricular block, complete: Secondary | ICD-10-CM | POA: Diagnosis present

## 2023-08-18 DIAGNOSIS — I2510 Atherosclerotic heart disease of native coronary artery without angina pectoris: Secondary | ICD-10-CM | POA: Diagnosis present

## 2023-08-18 DIAGNOSIS — E785 Hyperlipidemia, unspecified: Secondary | ICD-10-CM | POA: Diagnosis present

## 2023-08-18 DIAGNOSIS — R001 Bradycardia, unspecified: Secondary | ICD-10-CM | POA: Diagnosis present

## 2023-08-18 DIAGNOSIS — J208 Acute bronchitis due to other specified organisms: Secondary | ICD-10-CM | POA: Diagnosis not present

## 2023-08-18 DIAGNOSIS — J440 Chronic obstructive pulmonary disease with acute lower respiratory infection: Secondary | ICD-10-CM | POA: Diagnosis present

## 2023-08-18 DIAGNOSIS — K219 Gastro-esophageal reflux disease without esophagitis: Secondary | ICD-10-CM | POA: Diagnosis present

## 2023-08-18 DIAGNOSIS — Z1152 Encounter for screening for COVID-19: Secondary | ICD-10-CM | POA: Diagnosis not present

## 2023-08-18 DIAGNOSIS — G9341 Metabolic encephalopathy: Secondary | ICD-10-CM | POA: Diagnosis present

## 2023-08-18 DIAGNOSIS — J209 Acute bronchitis, unspecified: Secondary | ICD-10-CM | POA: Diagnosis present

## 2023-08-18 DIAGNOSIS — J22 Unspecified acute lower respiratory infection: Secondary | ICD-10-CM | POA: Diagnosis not present

## 2023-08-18 LAB — BASIC METABOLIC PANEL
Anion gap: 15 (ref 5–15)
BUN: 32 mg/dL — ABNORMAL HIGH (ref 8–23)
CO2: 20 mmol/L — ABNORMAL LOW (ref 22–32)
Calcium: 10 mg/dL (ref 8.9–10.3)
Chloride: 106 mmol/L (ref 98–111)
Creatinine, Ser: 1.33 mg/dL — ABNORMAL HIGH (ref 0.61–1.24)
GFR, Estimated: 55 mL/min — ABNORMAL LOW (ref >60)
Glucose, Bld: 180 mg/dL — ABNORMAL HIGH (ref 70–99)
Potassium: 3.9 mmol/L (ref 3.5–5.1)
Sodium: 141 mmol/L (ref 135–145)

## 2023-08-18 LAB — CBC
HCT: 38.8 % — ABNORMAL LOW (ref 39.0–52.0)
Hemoglobin: 12.6 g/dL — ABNORMAL LOW (ref 13.0–17.0)
MCH: 26.1 pg (ref 26.0–34.0)
MCHC: 32.5 g/dL (ref 30.0–36.0)
MCV: 80.3 fL (ref 80.0–100.0)
Platelets: 279 10*3/uL (ref 150–400)
RBC: 4.83 MIL/uL (ref 4.22–5.81)
RDW: 15.7 % — ABNORMAL HIGH (ref 11.5–15.5)
WBC: 9.1 10*3/uL (ref 4.0–10.5)
nRBC: 0 % (ref 0.0–0.2)

## 2023-08-18 LAB — SURGICAL PCR SCREEN
MRSA, PCR: NEGATIVE
Staphylococcus aureus: NEGATIVE

## 2023-08-18 LAB — PROCALCITONIN: Procalcitonin: 0.21 ng/mL

## 2023-08-18 LAB — SODIUM, URINE, RANDOM: Sodium, Ur: 49 mmol/L

## 2023-08-18 LAB — CREATININE, URINE, RANDOM: Creatinine, Urine: 116 mg/dL

## 2023-08-18 LAB — MAGNESIUM: Magnesium: 2.1 mg/dL (ref 1.7–2.4)

## 2023-08-18 LAB — GLUCOSE, CAPILLARY
Glucose-Capillary: 181 mg/dL — ABNORMAL HIGH (ref 70–99)
Glucose-Capillary: 180 mg/dL — ABNORMAL HIGH (ref 70–99)
Glucose-Capillary: 144 mg/dL — ABNORMAL HIGH (ref 70–99)
Glucose-Capillary: 219 mg/dL — ABNORMAL HIGH (ref 70–99)

## 2023-08-18 LAB — STREP PNEUMONIAE URINARY ANTIGEN: Strep Pneumo Urinary Antigen: NEGATIVE

## 2023-08-18 SURGERY — INVASIVE LAB ABORTED CASE

## 2023-08-18 MED ORDER — CHLORHEXIDINE GLUCONATE 4 % EX SOLN
60.0000 mL | Freq: Once | CUTANEOUS | Status: AC
  Administered 2023-08-18: 4 via TOPICAL
  Filled 2023-08-18: qty 15

## 2023-08-18 MED ORDER — LIDOCAINE HCL (PF) 1 % IJ SOLN
INTRAMUSCULAR | Status: AC
  Filled 2023-08-18: qty 30

## 2023-08-18 MED ORDER — CEFAZOLIN SODIUM-DEXTROSE 2-4 GM/100ML-% IV SOLN
2.0000 g | INTRAVENOUS | Status: DC
  Filled 2023-08-18: qty 100

## 2023-08-18 MED ORDER — ALBUTEROL SULFATE HFA 108 (90 BASE) MCG/ACT IN AERS: 2.0000 | INHALATION_SPRAY | Freq: Four times a day (QID) | RESPIRATORY_TRACT | Status: DC | PRN

## 2023-08-18 MED ORDER — GUAIFENESIN ER 600 MG PO TB12
600.0000 mg | ORAL_TABLET | Freq: Two times a day (BID) | ORAL | Status: DC
  Administered 2023-08-18 – 2023-08-25 (×14): 600 mg via ORAL
  Filled 2023-08-18 (×14): qty 1

## 2023-08-18 MED ORDER — EMPAGLIFLOZIN 10 MG PO TABS
10.0000 mg | ORAL_TABLET | Freq: Every day | ORAL | Status: DC
  Administered 2023-08-18 – 2023-08-25 (×8): 10 mg via ORAL
  Filled 2023-08-18 (×8): qty 1

## 2023-08-18 MED ORDER — SODIUM CHLORIDE 0.9 % IV SOLN: 500.0000 mg | INTRAVENOUS | Status: DC

## 2023-08-18 MED ORDER — SODIUM CHLORIDE 0.9 % IV SOLN
INTRAVENOUS | Status: AC
  Filled 2023-08-18: qty 2

## 2023-08-18 MED ORDER — CHLORHEXIDINE GLUCONATE 4 % EX SOLN: 60.0000 mL | Freq: Once | CUTANEOUS | Status: DC

## 2023-08-18 MED ORDER — SODIUM CHLORIDE 0.9 % IV SOLN: INTRAVENOUS | Status: DC

## 2023-08-18 MED ORDER — CHLORHEXIDINE GLUCONATE 4 % EX SOLN
CUTANEOUS | Status: AC
  Filled 2023-08-18: qty 15

## 2023-08-18 MED ORDER — SODIUM CHLORIDE 0.9 % IV SOLN
80.0000 mg | INTRAVENOUS | Status: DC
  Filled 2023-08-18: qty 2

## 2023-08-18 MED ORDER — AZITHROMYCIN 250 MG PO TABS
500.0000 mg | ORAL_TABLET | Freq: Every day | ORAL | Status: DC
  Administered 2023-08-18: 500 mg via ORAL
  Filled 2023-08-18: qty 2

## 2023-08-18 MED ORDER — LOSARTAN POTASSIUM 50 MG PO TABS
100.0000 mg | ORAL_TABLET | Freq: Every day | ORAL | Status: DC
  Administered 2023-08-18 – 2023-08-25 (×8): 100 mg via ORAL
  Filled 2023-08-18 (×8): qty 2

## 2023-08-18 MED ORDER — CHLORTHALIDONE 25 MG PO TABS
25.0000 mg | ORAL_TABLET | Freq: Every day | ORAL | Status: DC
  Administered 2023-08-19 – 2023-08-20 (×2): 25 mg via ORAL
  Filled 2023-08-18 (×3): qty 1

## 2023-08-18 MED ORDER — SODIUM CHLORIDE 0.9 % IV SOLN: 2.0000 g | INTRAVENOUS | Status: DC

## 2023-08-18 MED ORDER — SODIUM CHLORIDE 0.9 % IV SOLN
1.5000 g | Freq: Four times a day (QID) | INTRAVENOUS | Status: DC
  Administered 2023-08-18 – 2023-08-22 (×16): 1.5 g via INTRAVENOUS
  Filled 2023-08-18 (×18): qty 4

## 2023-08-18 MED ORDER — LACTATED RINGERS IV SOLN: INTRAVENOUS | Status: AC

## 2023-08-18 MED ORDER — HALOPERIDOL LACTATE 5 MG/ML IJ SOLN: 1.0000 mg | Freq: Four times a day (QID) | INTRAMUSCULAR | Status: DC | PRN

## 2023-08-18 MED ORDER — CEFAZOLIN SODIUM-DEXTROSE 2-4 GM/100ML-% IV SOLN
INTRAVENOUS | Status: AC
  Filled 2023-08-18: qty 100

## 2023-08-18 SURGICAL SUPPLY — 4 items
CABLE SURGICAL S-101-97-12 (CABLE) ×2 IMPLANT
PAD DEFIB RADIO PHYSIO CONN (PAD) ×2 IMPLANT
SHEATH 7FR PRELUDE SNAP 13 (SHEATH) ×2 IMPLANT
TRAY PACEMAKER INSERTION (PACKS) ×2 IMPLANT

## 2023-08-18 NOTE — Progress Notes (Signed)
New fever, persistent cough with encephalopathy. Severe sepsis with new AKI. - Check CT chest, RSV/COVID\Influenza negative.  - Check legionella and step pneumo. - Concern about aspiration cover with Unasyn. - Check pro calcitonin.

## 2023-08-18 NOTE — Interval H&P Note (Signed)
History and Physical Interval Note:  08/18/2023 11:24 AM  Karl Lawson.  has presented today for surgery, with the diagnosis of heart block.  The various methods of treatment have been discussed with the patient and family. After consideration of risks, benefits and other options for treatment, the patient has consented to  Procedure(s): PACEMAKER IMPLANT (N/A) as a surgical intervention.  The patient's history has been reviewed, patient examined, no change in status, stable for surgery.  I have reviewed the patient's chart and labs.  Questions were answered to the patient's satisfaction.     [name]

## 2023-08-18 NOTE — Progress Notes (Signed)
EP Attending  Patient presented to EP lab for PPM insertion due to 2:1 AV block. He was found to be febrile with rhonchorous breath sounds.Temp was 102. We will cancel his PPM today, treat his pulmonary toilet (note Covid and Influenza screen was negative yesterday). Consider PPM on Monday if no other issues. I did not start IV antibiotics but blood cultures were requested.   Sharlot Gowda Teddie Curd,MD

## 2023-08-18 NOTE — Progress Notes (Signed)
Pt EKG resulted as critical result 2: 1 AV block MD updated. SRP, RN

## 2023-08-18 NOTE — Progress Notes (Signed)
[facility] PROGRESS NOTE    Progress Note  Karl Lawson.  YQM:[id number] DOB: [birth year] DOA: 08/16/2023 PCP: Mliss Sax, MD     Brief Narrative:   Karl Lawson. is an 77 y.o. male past medical history significant for obesity, diabetes mellitus type 2, essential hypertension COPD, recently seen in the ED for confusion, most of the history was obtained from the son he relates his mental status mostly intact as some examinations at times he relates this has been getting worse since his Sinemet was started.  His wife was sick and he saw her on 08/11/2023 started developing cough after this nonpurulent.  Started on Robitussin for which hallucinations got worse after this.  Then switch to NyQuil and he has not slept over the last 2 nights.  Chest x-ray in the ED was unremarkable, MRI of the brain showed no acute intracranial abnormalities she has some chronic changes ischemic of small vessels.  Started empirically on Rocephin and azithromycin we will hold this of Decadron.    Assessment/Plan:   Acute confusional state/acute metabolic encephalopathy: In the setting of a recent viral infection and probably medication use.  Needs to avoid Robitussin and DayQuil as an outpatient. Workup was unremarkable. [place] potassium greater than 4 magnesium greater than 2. Start on melatonin at night as well as Seroquel. IV Haldol for agitation as needed.  Asymptomatic bradycardia with a 2-1 heart block: Progressive by prior EKGs. EP was consulted and recommended pacer  Acute kidney injury: Likely prerenal started on IV fluids recheck basic metabolic panel in the morning. Baseline creatinine around 1.  History of Parkinson disease: Continue Sinemet.  Upper respiratory infection/purulent conjunctivitis:  Respiratory panel was negative. Started on ciprofloxacin eyedrop. He was given a single dose of IV Rocephin and azithromycin on 08/16/2023, white count was 10.810, now is  9.1. He has remained afebrile.  Controlled diabetes mellitus type 2: With an A1c of 6.1 continue current regimen, blood glucose fairly controlled.  Essential hypertension: Blood pressure significantly elevated he was started on losartan IV hydralazine as needed. Continue to titrate antihypertensive medication for goal blood pressure less than 160/100  Hyperlipidemia: Continue statins.  COPD: Continue inhalers.  GERD: Continue PPI.  BPH: Continue Flomax and finasteride.   DVT prophylaxis: lovenox Family Communication:sister-in-law Status is: Observation The patient remains OBS appropriate and will d/c before 2 midnights.    Code Status:     Code Status Orders  (From admission, onward)           Start     Ordered   08/17/23 1115  Full code  Continuous       Question:  By:  Answer:  Consent: discussion documented in EHR   08/17/23 1116           Code Status History     Date Active Date Inactive Code Status Order ID Comments User Context   05/24/2022 0859 05/24/2022 1643 Full Code [id number]  Corky Crafts, MD Inpatient      Advance Directive Documentation    Flowsheet Row Most Recent Value  Type of Advance Directive Living will, Healthcare Power of Attorney  Pre-existing out of facility DNR order (yellow form or pink MOST form) --  "MOST" Form in Place? --         IV Access:   Peripheral IV   Procedures and diagnostic studies:   MR Brain Wo Contrast (neuro protocol)  Result Date: 08/17/2023 CLINICAL DATA:  Initial evaluation for neuro deficit, stroke  suspected. EXAM: MRI HEAD WITHOUT CONTRAST TECHNIQUE: Multiplanar, multiecho pulse sequences of the brain and surrounding structures were obtained without intravenous contrast. COMPARISON:  None Available. FINDINGS: Brain: Examination moderately degraded by motion. Generalized age-related cerebral atrophy. Patchy T2/FLAIR hyperintensity involving the periventricular and deep white matter,  most likely related chronic microvascular ischemic disease, mild for age. No evidence for acute or subacute ischemia. No visible areas of chronic cortical infarction. No visible acute or chronic intracranial blood products, although the SWI sequences are severely degraded by motion. No mass lesion, midline shift or mass effect. No hydrocephalus or extra-axial fluid collection. Pituitary gland suprasellar region within normal limits. Vascular: Major intracranial vascular flow voids are maintained. Skull and upper cervical spine: Craniocervical junction within normal limits. Bone marrow signal intensity normal. No scalp soft tissue abnormality. Sinuses/Orbits: Prior bilateral ocular lens replacement. Moderate mucosal thickening present throughout the paranasal sinuses. No mastoid effusion. Other: None. IMPRESSION: 1. Motion degraded exam. 2. No acute intracranial abnormality. 3. Age-related cerebral atrophy with mild chronic small vessel ischemic disease. Electronically Signed   By: Rise Mu M.D.   On: 08/17/2023 04:11   DG Chest Portable 1 View  Result Date: 08/17/2023 CLINICAL DATA:  Shortness of breath, dizziness. EXAM: PORTABLE CHEST 1 VIEW COMPARISON:  02/03/2022. FINDINGS: The heart size and mediastinal contours are within normal limits. There is atherosclerotic calcification of the aorta. Lung volumes are low. No consolidation, effusion, or pneumothorax. No acute osseous abnormality. IMPRESSION: No active disease. Electronically Signed   By: Thornell Sartorius M.D.   On: 08/17/2023 02:14     Medical Consultants:   None.   Subjective:    Karl Lawson. no complaints mentation is at baseline.  Objective:    Vitals:   08/17/23 1625 08/17/23 1911 08/18/23 0440 08/18/23 0747  BP: (!) 169/35 (!) 137/38 (!) 166/45 (!) 175/42  Pulse:  (!) 43 (!) 43 (!) 40  Resp: 18 20 18 18   Temp: 98 F (36.7 C)  98 F (36.7 C) 97.9 F (36.6 C)  TempSrc: Oral  Oral Oral  SpO2:  96% 95% 95%   Weight:      Height:       SpO2: 95 %   Intake/Output Summary (Last 24 hours) at 08/18/2023 1018 Last data filed at 08/18/2023 0636 Gross per 24 hour  Intake 120 ml  Output 250 ml  Net -130 ml   Filed Weights   08/16/23 1505  Weight: 95.3 kg    Exam: General exam: In no acute distress. Respiratory system: Good air movement and clear to auscultation. Cardiovascular system: S1 & S2 heard, RRR. No JVD. Gastrointestinal system: Abdomen is nondistended, soft and nontender.  Extremities: No pedal edema. Skin: No rashes, lesions or ulcers Psychiatry: Judgement and insight appear normal. Mood & affect appropriate.    Data Reviewed:    Labs: Basic Metabolic Panel: Recent Labs  Lab 08/16/23 1517 08/17/23 0840 08/18/23 0420  NA 137 140 141  K 3.7 3.6 3.9  CL 101 102 106  CO2 22 16* 20*  GLUCOSE 161* 199* 180*  BUN 20 24* 32*  CREATININE 1.24 1.26* 1.33*  CALCIUM 10.0 9.9 10.0  MG  --   --  2.1   GFR Estimated Creatinine Clearance: 52.1 mL/min (A) (by C-G formula based on SCr of 1.33 mg/dL (H)). Liver Function Tests: Recent Labs  Lab 08/17/23 0840  AST 23  ALT 16  ALKPHOS 61  BILITOT 2.1*  PROT 6.2*  ALBUMIN 3.3*   No results  for input(s): "LIPASE", "AMYLASE" in the last 168 hours. Recent Labs  Lab 08/17/23 0830  AMMONIA 32   Coagulation profile No results for input(s): "INR", "PROTIME" in the last 168 hours. COVID-19 Labs  No results for input(s): "DDIMER", "FERRITIN", "LDH", "CRP" in the last 72 hours.  Lab Results  Component Value Date   SARSCOV2NAA NEGATIVE 08/17/2023    CBC: Recent Labs  Lab 08/16/23 1517 08/17/23 0831 08/18/23 0420  WBC 10.8* 8.9 9.1  NEUTROABS  --  7.2  --   HGB 12.8* 12.5* 12.6*  HCT 40.1 39.9 38.8*  MCV 82.3 83.0 80.3  PLT 249 244 279   Cardiac Enzymes: No results for input(s): "CKTOTAL", "CKMB", "CKMBINDEX", "TROPONINI" in the last 168 hours. BNP (last 3 results) No results for input(s): "PROBNP" in the  last 8760 hours. CBG: Recent Labs  Lab 08/17/23 1101 08/17/23 1232 08/17/23 1655 08/17/23 2117 08/18/23 0807  GLUCAP 180* 178* 203* 175* 181*   D-Dimer: No results for input(s): "DDIMER" in the last 72 hours. Hgb A1c: No results for input(s): "HGBA1C" in the last 72 hours. Lipid Profile: No results for input(s): "CHOL", "HDL", "LDLCALC", "TRIG", "CHOLHDL", "LDLDIRECT" in the last 72 hours. Thyroid function studies: Recent Labs    08/17/23 0830  TSH 1.745   Anemia work up: No results for input(s): "VITAMINB12", "FOLATE", "FERRITIN", "TIBC", "IRON", "RETICCTPCT" in the last 72 hours. Sepsis Labs: Recent Labs  Lab 08/16/23 1517 08/17/23 0831 08/18/23 0420  WBC 10.8* 8.9 9.1   Microbiology Recent Results (from the past 240 hour(s))  Resp panel by RT-PCR (RSV, Flu A&B, Covid) Anterior Nasal Swab     Status: None   Collection Time: 08/17/23  2:13 AM   Specimen: Anterior Nasal Swab  Result Value Ref Range Status   SARS Coronavirus 2 by RT PCR NEGATIVE NEGATIVE Final   Influenza A by PCR NEGATIVE NEGATIVE Final   Influenza B by PCR NEGATIVE NEGATIVE Final    Comment: (NOTE) The Xpert Xpress SARS-CoV-2/FLU/RSV plus assay is intended as an aid in the diagnosis of influenza from Nasopharyngeal swab specimens and should not be used as a sole basis for treatment. Nasal washings and aspirates are unacceptable for Xpert Xpress SARS-CoV-2/FLU/RSV testing.  Fact Sheet for Patients: BloggerCourse.com  Fact Sheet for Healthcare Providers: SeriousBroker.it  This test is not yet approved or cleared by the [place] FDA and has been authorized for detection and/or diagnosis of SARS-CoV-2 by FDA under an Emergency Use Authorization (EUA). This EUA will remain in effect (meaning this test can be used) for the duration of the COVID-19 declaration under Section 564(b)(1) of the Act, 21 U.S.C. section 360bbb-3(b)(1), unless the  authorization is terminated or revoked.     Resp Syncytial Virus by PCR NEGATIVE NEGATIVE Final    Comment: (NOTE) Fact Sheet for Patients: BloggerCourse.com  Fact Sheet for Healthcare Providers: SeriousBroker.it  This test is not yet approved or cleared by the [place] FDA and has been authorized for detection and/or diagnosis of SARS-CoV-2 by FDA under an Emergency Use Authorization (EUA). This EUA will remain in effect (meaning this test can be used) for the duration of the COVID-19 declaration under Section 564(b)(1) of the Act, 21 U.S.C. section 360bbb-3(b)(1), unless the authorization is terminated or revoked.  Performed at [facility], [address], [place], [place] [address]      Medications:    aspirin EC  81 mg Oral Daily   carbidopa-levodopa  1.5 tablet Oral TID   ciprofloxacin  1 drop Both Eyes Q4H while awake   finasteride  5 mg Oral Daily   insulin aspart  0-5 Units Subcutaneous QHS   insulin aspart  0-9 Units Subcutaneous TID WC   melatonin  3 mg Oral QHS   pantoprazole  80 mg Oral Daily   QUEtiapine  25 mg Oral QHS   senna  1 tablet Oral BID   simvastatin  40 mg Oral QHS   Continuous Infusions:  lactated ringers 100 mL/hr at 08/18/23 0636      LOS: 0 days   Marinda Elk  [facility]  08/18/2023, 10:18 AM

## 2023-08-18 NOTE — Progress Notes (Signed)
  Acute kidney injury: -Lab check showing creatinine has been trended up 1.24 to 1.33 over the course of last 24 hours.  Acute kidney injury in the setting of poor oral intake in the context of worsening Parkinson disease. - Need to encourage patient's oral hydration - Starting maintenance fluid LR 100 cc/h for 10 hours. -Checking urine creatinine and urine sodium level - Continue to monitor urine output.  Tereasa Coop, MD [facility] 08/18/2023, 5:53 AM

## 2023-08-18 NOTE — Discharge Instructions (Addendum)
After Your Pacemaker   You have a [employer] Pacemaker  ACTIVITY Do not lift your arm above shoulder height for 1 week after your procedure. After 7 days, you may progress as below.  You should remove your sling 24 hours after your procedure, unless otherwise instructed by your provider.     Friday September 01, 2023  Saturday September 02, 2023 Sunday September 03, 2023 Monday September 04, 2023   Do not lift, push, pull, or carry anything over 10 pounds with the affected arm until 6 weeks (Friday October 06, 2023 ) after your procedure.   You may drive AFTER your wound check, unless you have been told otherwise by your provider.   Ask your healthcare provider when you can go back to work   INCISION/Dressing   Monitor your Pacemaker site for redness, swelling, and drainage. Call the device clinic at [phone] if you experience these symptoms or fever/chills.  If your incision is sealed with Steri-strips or staples, you may shower 7 days after your procedure or when told by your provider. Do not remove the steri-strips or let the shower hit directly on your site. You may wash around your site with soap and water.    If you were discharged in a sling, please do not wear this during the day more than 48 hours after your surgery unless otherwise instructed. This may increase the risk of stiffness and soreness in your shoulder.   Avoid lotions, ointments, or perfumes over your incision until it is well-healed.  You may use a hot tub or a pool AFTER your wound check appointment if the incision is completely closed.  Pacemaker Alerts:  Some alerts are vibratory and others beep. These are NOT emergencies. Please call our office to let us know. If this occurs at night or on weekends, it can wait until the next business day. Send a remote transmission.  If your device is capable of reading fluid status (for heart failure), you will be offered monthly monitoring to review this with you.   DEVICE  MANAGEMENT Remote monitoring is used to monitor your pacemaker from home. This monitoring is scheduled every 91 days by our office. It allows [place] to keep an eye on the functioning of your device to ensure it is working properly. You will routinely see your Electrophysiologist annually (more often if necessary).   You should receive your ID card for your new device in 4-8 weeks. Keep this card with you at all times once received. Consider wearing a medical alert bracelet or necklace.  Your Pacemaker may be MRI compatible. This will be discussed at your next office visit/wound check.  You should avoid contact with strong electric or magnetic fields.   Do not use amateur (ham) radio equipment or electric (arc) welding torches. MP3 player headphones with magnets should not be used. Some devices are safe to use if held at least 12 inches (30 cm) from your Pacemaker. These include power tools, lawn mowers, and speakers. If you are unsure if something is safe to use, ask your health care provider.  When using your cell phone, hold it to the ear that is on the opposite side from the Pacemaker. Do not leave your cell phone in a pocket over the Pacemaker.  You may safely use electric blankets, heating pads, computers, and microwave ovens.  Call the office right away if: You have chest pain. You feel more short of breath than you have felt before. You feel more light-headed than you  have felt before. Your incision starts to open up.  This information is not intended to replace advice given to you by your health care provider. Make sure you discuss any questions you have with your health care provider.

## 2023-08-18 NOTE — H&P (View-Only) (Signed)
ELECTROPHYSIOLOGY CONSULT NOTE    Patient ID: Karl Lawson. MRN: [id number], DOB/AGE: 1946/01/21 76 y.o.  Admit date: 08/16/2023 Date of Consult: 08/18/2023  Primary Physician: Mliss Sax, MD Primary Cardiologist: Meriam Sprague, MD  Electrophysiologist: Dr. Elberta Fortis    Referring Provider: Dr. David Stall  Patient Profile: Karl Matsumura. is a 77 y.o. male with a history of obesity, DM2, HTN, HLD, COPD, GERD, Parkinson disease 10/9, and Mobitz 1 who is being seen today for the evaluation of 2:1 AV block at the request of Dr. David Stall.  HPI:  Karl Lawson. is a 77 y.o. male with medical history as above.   Saw Dr. Elberta Fortis  04/2022 for Mobitz 1 HB on monitor. Was felt he was minimally symptomatic and no indication for pacing at that time  Echo 03/25/2022 : LVEF 70-75% Cath 05/24/2022 :  Mild, Diffuse, Non-obstructive CAD  Patient also developed a cough starting Friday 10/4.  Over the next few days he also developed congested eyes with purulent drainage.  Saturday, patient started taking Robitussin after which his hallucination worsened.  He then switched to DayQuil and NyQuil.  He has not been able to sleep for last 2 nights.  He started hallucinating significantly and hence he was brought to the ED yesterday. While waited in the ED triage for several hours last night and became increasingly more confused.    Afebrile.   Resp viral panel negative.    EKG initial arrival to ED mislabeled as AF. Appears sinus rhythm in variable conduction and ectopy, in the setting of his underlying heart block.  Pt started on broad spectrum ABx that were stopped. CXR without acute finding.    One pt settled, EKG and telemetry revealed 2:1 AV block, which has persistent. EP consulted for PPM consideration.  Today, he is awake and alert. Son on phone states this is the "clearest he has been in several days". Pt denies syncope. Did feel quite dizzy and fatigued yesterday and  the past several days.  He does remember meeting with Dr. Elberta Fortis last year. Lives at home along and is fairly functional at baseline.   Labs Potassium3.9 (10/11 0420) Magnesium  2.1 (10/11 0420) Creatinine, ser  1.33* (10/11 0420) PLT  279 (10/11 0420) HGB  12.6* (10/11 0420) WBC 9.1 (10/11 0420)  .    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)   Cataract    COPD (chronic obstructive pulmonary disease) (HCC)    Diabetes mellitus    Diverticulosis of colon 11/19/2012   left colon, Dr Leone Payor   GERD (gastroesophageal reflux disease)    History of adenomatous polyps of colon - probable attenuated polyposis 07/17/2008   2004: 2 polyps max 8 mm 1 lost and 1 infammatory 2010: 7 polyps TV and tubular adenomas max 6 mm 11/19/2012 :4 polyps; max 1 cm removed; all adenomas 01/05/2017 8 polyps max 10 mm adenomas recall 2021     Hyperlipidemia    Hypertension    Lumbago    buldging disc L4-5   Personal history of adenomatous colonic polyps 07/17/2008   2004 2 polyps max 8 mm (adenomas) 2010 7 polyps TV and tubular adenomas max 6 mm 11/19/2012       Surgical History:  Past Surgical History:  Procedure Laterality Date   APPENDECTOMY  1959   CATARACT EXTRACTION Right    COLONOSCOPY  multiple   adenomatous polyps,  Dr Leone Payor   ESOPHAGOGASTRODUODENOSCOPY  multiple   INGUINAL HERNIA REPAIR Left 1997   INGUINAL HERNIA REPAIR Bilateral 10/24/2022   Procedure: LAPAROSCOPIC BILATERAL INGUINAL HERNIA REPAIR WITH MESH;  Surgeon: Axel Filler, MD;  Location: [facility] OR;  Service: General;  Laterality: Bilateral;   LEFT HEART CATH AND CORONARY ANGIOGRAPHY N/A 05/24/2022   Procedure: LEFT HEART CATH AND CORONARY ANGIOGRAPHY;  Surgeon: Corky Crafts, MD;  Location: [facility] INVASIVE CV LAB;  Service: Cardiovascular;  Laterality: N/A;   TONSILLECTOMY       Medications Prior to Admission  Medication Sig Dispense Refill Last Dose    acetaminophen (TYLENOL) 500 MG tablet Take 500 mg by mouth every 6 (six) hours as needed for moderate pain.   Past Week   albuterol (VENTOLIN HFA) 108 (90 Base) MCG/ACT inhaler Inhale 2 puffs into the lungs every 6 (six) hours as needed for wheezing or shortness of breath.   08/16/2023   aspirin EC 81 MG tablet Take 81 mg by mouth daily.   UNK   carbidopa-levodopa (SINEMET IR) 25-100 MG tablet TAKE 1.5 TABLETS BY MOUTH 3 (THREE) TIMES DAILY. 6AM/10-11AM/3-4PM 405 tablet 0 Past Week   chlorthalidone (HYGROTON) 25 MG tablet Take 25 mg by mouth daily.   Past Week   finasteride (PROSCAR) 5 MG tablet Take 1 tablet (5 mg total) by mouth daily. 90 tablet 3 Past Week   Glucose 15 g PACK Take 15 g by mouth as needed (hypoglycemia).   UNK   guaifenesin (ROBITUSSIN) 100 MG/5ML syrup Take 200 mg by mouth 3 (three) times daily as needed for cough.   08/16/2023   Insulin Aspart Prot & Aspart (NOVOLOG MIX 70/30 [place]) Inject 30 Units into the skin 2 (two) times daily.   Past Week   JARDIANCE 10 MG TABS tablet Take 10 mg by mouth daily.   Past Week   losartan (COZAAR) 100 MG tablet Take 1 tablet (100 mg total) by mouth daily. 90 tablet 1 Past Week   Metamucil Fiber CHEW Chew 1 each by mouth daily.   Past Week   metFORMIN (GLUCOPHAGE) 1000 MG tablet Take 1 tablet (1,000 mg total) by mouth 2 (two) times daily with a meal. 60 tablet 0 Past Week   omeprazole (PRILOSEC) 40 MG capsule Take 1 capsule (40 mg total) by mouth daily. 90 capsule 3 Past Week   Pseudoeph-Doxylamine-DM-APAP (NYQUIL PO) Take 30 mLs by mouth at bedtime as needed (cough).   Past Week   simvastatin (ZOCOR) 40 MG tablet Take 1 tablet (40 mg total) by mouth at bedtime. 90 tablet 3 Past Week   traMADol (ULTRAM) 50 MG tablet Take 50 mg by mouth every 6 (six) hours as needed (pain).   UNK    Inpatient Medications:   aspirin EC  81 mg Oral Daily   carbidopa-levodopa  1.5 tablet Oral TID   ciprofloxacin  1 drop Both Eyes Q4H while awake   finasteride  5 mg  Oral Daily   insulin aspart  0-5 Units Subcutaneous QHS   insulin aspart  0-9 Units Subcutaneous TID WC   melatonin  3 mg Oral QHS   pantoprazole  80 mg Oral Daily   QUEtiapine  25 mg Oral QHS   senna  1 tablet Oral BID   simvastatin  40 mg Oral QHS    Allergies: No Known Allergies  Family History  Problem Relation Age of Onset   Alzheimer's disease Mother    Heart disease Father  CHF   Stroke Father 21   Tremor Father    Tremor Sister    Pulmonary embolism Brother    Diabetes Brother    Heart disease Paternal Grandfather    Asthma Neg Hx    Colon cancer Neg Hx    Esophageal cancer Neg Hx    Pancreatic cancer Neg Hx    Prostate cancer Neg Hx    Rectal cancer Neg Hx    Stomach cancer Neg Hx      Physical Exam: Vitals:   08/17/23 1625 08/17/23 1911 08/18/23 0440 08/18/23 0747  BP: (!) 169/35 (!) 137/38 (!) 166/45 (!) 175/42  Pulse:  (!) 43 (!) 43 (!) 40  Resp: 18 20 18 18   Temp: 98 F (36.7 C)  98 F (36.7 C) 97.9 F (36.6 C)  TempSrc: Oral  Oral Oral  SpO2:  96% 95% 95%  Weight:      Height:        GEN- NAD, A&O x 3, normal affect HEENT: Normocephalic, atraumatic Lungs- CTAB, Normal effort.  Heart- Regular rate and rhythm, No M/G/R.  GI- Soft, NT, ND.  Extremities- No clubbing, cyanosis, or edema   Radiology/Studies: MR Brain Wo Contrast (neuro protocol)  Result Date: 08/17/2023 CLINICAL DATA:  Initial evaluation for neuro deficit, stroke suspected. EXAM: MRI HEAD WITHOUT CONTRAST TECHNIQUE: Multiplanar, multiecho pulse sequences of the brain and surrounding structures were obtained without intravenous contrast. COMPARISON:  None Available. FINDINGS: Brain: Examination moderately degraded by motion. Generalized age-related cerebral atrophy. Patchy T2/FLAIR hyperintensity involving the periventricular and deep white matter, most likely related chronic microvascular ischemic disease, mild for age. No evidence for acute or subacute ischemia. No visible  areas of chronic cortical infarction. No visible acute or chronic intracranial blood products, although the SWI sequences are severely degraded by motion. No mass lesion, midline shift or mass effect. No hydrocephalus or extra-axial fluid collection. Pituitary gland suprasellar region within normal limits. Vascular: Major intracranial vascular flow voids are maintained. Skull and upper cervical spine: Craniocervical junction within normal limits. Bone marrow signal intensity normal. No scalp soft tissue abnormality. Sinuses/Orbits: Prior bilateral ocular lens replacement. Moderate mucosal thickening present throughout the paranasal sinuses. No mastoid effusion. Other: None. IMPRESSION: 1. Motion degraded exam. 2. No acute intracranial abnormality. 3. Age-related cerebral atrophy with mild chronic small vessel ischemic disease. Electronically Signed   By: Rise Mu M.D.   On: 08/17/2023 04:11   DG Chest Portable 1 View  Result Date: 08/17/2023 CLINICAL DATA:  Shortness of breath, dizziness. EXAM: PORTABLE CHEST 1 VIEW COMPARISON:  02/03/2022. FINDINGS: The heart size and mediastinal contours are within normal limits. There is atherosclerotic calcification of the aorta. Lung volumes are low. No consolidation, effusion, or pneumothorax. No acute osseous abnormality. IMPRESSION: No active disease. Electronically Signed   By: Thornell Sartorius M.D.   On: 08/17/2023 02:14    EKG: on arrival shows 2:1 AV block in the 40s (personally reviewed)  EKG 10/10 is NOT consistent with AF.  With discrete P waves very frequent ectopy in the setting of his illness.  TELEMETRY: 2:1 AV block in the 40s, rare PVCs (personally reviewed)  Assessment/Plan:  2:1 AV block No reversible cause noted Progressive by EKGs and prior tele monitoring Normal EF and clean cath last year. Explained risks, benefits, and alternatives to PPM implantation, including but not limited to bleeding, infection, pneumothorax, pericardial  effusion, lead dislodgement, heart attack, stroke, or death.  Pt verbalized understanding and agrees to proceed.  H/o parkinsons disease  DM2 HTN Conjunctivitis  Per primary team.   Confusion Multifactorial Needs to avoid combination OTC drugs in the future (was taking Robitussin and Dayquil/Nyquil)   For questions or updates, please contact [facility] Please consult [email or web address] for contact info under Cardiology/STEMI.  Dustin Flock, PA-C  08/18/2023 8:57 AM  EP Attending  Patient seen and examined. Agree with above. The patient presents with symptomatic 2:1 AV block and RBBB and known AV conduction system disease on no AV node blocking drugs. In addition he has Parkinson's. He has conjunctivitis. He is not febrile and on exam he some conjunctival injection, a regular brady and clear lungs and no edema. Neuro does not show any tremors today. Tele demonstrates NSR with 2:1 AV block.   A/P Symptomatic heart block - I have discussed the indications/risks/benefits/goals/expectations of PPM insertion and he wishes to proceed.   Sharlot Gowda Shem Plemmons,MD

## 2023-08-18 NOTE — TOC CM/SW Note (Signed)
Transition of Care [facility]) - Inpatient Brief Assessment   Patient Details  Name: Karl Lawson. MRN: [id number] Date of Birth: [birth year]  Transition of Care [facility]) CM/SW Contact:    Gala Lewandowsky, RN Phone Number: 08/18/2023, 11:34 AM   Clinical Narrative: Patient presented for AMS. Plan for pacemaker implant today. Case Manager will continue to follow for transition of care needs as the patient progresses.    Transition of Care Asessment: Insurance and Status: Insurance coverage has been reviewed Patient has primary care physician: Yes Prior/Current Home Services: No current home services Social Determinants of Health Reivew: SDOH reviewed no interventions necessary Readmission risk has been reviewed: Yes Transition of care needs: no transition of care needs at this time

## 2023-08-18 NOTE — Progress Notes (Addendum)
  Asymptomatic bradycardia with 2:1 heart block: Patient's nurse reporting that noticing bradycardia at telemonitoring.   Obtained EKG which showed 2:1 heart block with left axis deviation and heart rate 43. -Per chart review her initial presentation patient found to have atrial fibrillation heart rate 81> which converted to sinus rhythm> now developing 2:1 AV block. -Per chart review patient is not on any medication that can provoke heart block.  However he does history of Parkinson disease which can which can contribute to neuronal degeneration and contribute to heart block. There is no electrolyte derangement on initial CMP. Patient denies any chest pain, palpitation, and dizziness.  He is alert oriented x 3. -Will avoid any medication that can cause heart block.  Will do repeat EKG later today around 8 AM. - Continue cardiac monitoring. - I have spoken with on-call cardiology Dr. Lily Peer; he is concern for patient might have high degree AV block.  He will let electrophysiologist know in the morning to see patient.  Will follow-up with cardiology for further recommendation.   Tereasa Coop, MD [facility] 08/18/2023, 5:14 AM

## 2023-08-18 NOTE — Plan of Care (Signed)
  Problem: Education: Goal: Ability to describe self-care measures that may prevent or decrease complications (Diabetes Survival Skills Education) will improve Outcome: Progressing Goal: Individualized Educational Video(s) Outcome: Progressing   

## 2023-08-18 NOTE — Consult Note (Addendum)
ELECTROPHYSIOLOGY CONSULT NOTE    Patient ID: Karl Lawson. MRN: [id number], DOB/AGE: 1946/01/21 76 y.o.  Admit date: 08/16/2023 Date of Consult: 08/18/2023  Primary Physician: Mliss Sax, MD Primary Cardiologist: Meriam Sprague, MD  Electrophysiologist: Dr. Elberta Fortis    Referring Provider: Dr. David Stall  Patient Profile: Karl Lawson. is a 77 y.o. male with a history of obesity, DM2, HTN, HLD, COPD, GERD, Parkinson disease 10/9, and Mobitz 1 who is being seen today for the evaluation of 2:1 AV block at the request of Dr. David Stall.  HPI:  Karl Lawson. is a 77 y.o. male with medical history as above.   Saw Dr. Elberta Fortis  04/2022 for Mobitz 1 HB on monitor. Was felt he was minimally symptomatic and no indication for pacing at that time  Echo 03/25/2022 : LVEF 70-75% Cath 05/24/2022 :  Mild, Diffuse, Non-obstructive CAD  Patient also developed a cough starting Friday 10/4.  Over the next few days he also developed congested eyes with purulent drainage.  Saturday, patient started taking Robitussin after which his hallucination worsened.  He then switched to DayQuil and NyQuil.  He has not been able to sleep for last 2 nights.  He started hallucinating significantly and hence he was brought to the ED yesterday. While waited in the ED triage for several hours last night and became increasingly more confused.    Afebrile.   Resp viral panel negative.    EKG initial arrival to ED mislabeled as AF. Appears sinus rhythm in variable conduction and ectopy, in the setting of his underlying heart block.  Pt started on broad spectrum ABx that were stopped. CXR without acute finding.    One pt settled, EKG and telemetry revealed 2:1 AV block, which has persistent. EP consulted for PPM consideration.  Today, he is awake and alert. Son on phone states this is the "clearest he has been in several days". Pt denies syncope. Did feel quite dizzy and fatigued yesterday and  the past several days.  He does remember meeting with Dr. Elberta Fortis last year. Lives at home along and is fairly functional at baseline.   Labs Potassium3.9 (10/11 0420) Magnesium  2.1 (10/11 0420) Creatinine, ser  1.33* (10/11 0420) PLT  279 (10/11 0420) HGB  12.6* (10/11 0420) WBC 9.1 (10/11 0420)  .    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)   Cataract    COPD (chronic obstructive pulmonary disease) (HCC)    Diabetes mellitus    Diverticulosis of colon 11/19/2012   left colon, Dr Leone Payor   GERD (gastroesophageal reflux disease)    History of adenomatous polyps of colon - probable attenuated polyposis 07/17/2008   2004: 2 polyps max 8 mm 1 lost and 1 infammatory 2010: 7 polyps TV and tubular adenomas max 6 mm 11/19/2012 :4 polyps; max 1 cm removed; all adenomas 01/05/2017 8 polyps max 10 mm adenomas recall 2021     Hyperlipidemia    Hypertension    Lumbago    buldging disc L4-5   Personal history of adenomatous colonic polyps 07/17/2008   2004 2 polyps max 8 mm (adenomas) 2010 7 polyps TV and tubular adenomas max 6 mm 11/19/2012       Surgical History:  Past Surgical History:  Procedure Laterality Date   APPENDECTOMY  1959   CATARACT EXTRACTION Right    COLONOSCOPY  multiple   adenomatous polyps,  Dr Leone Payor   ESOPHAGOGASTRODUODENOSCOPY  multiple   INGUINAL HERNIA REPAIR Left 1997   INGUINAL HERNIA REPAIR Bilateral 10/24/2022   Procedure: LAPAROSCOPIC BILATERAL INGUINAL HERNIA REPAIR WITH MESH;  Surgeon: Axel Filler, MD;  Location: [facility] OR;  Service: General;  Laterality: Bilateral;   LEFT HEART CATH AND CORONARY ANGIOGRAPHY N/A 05/24/2022   Procedure: LEFT HEART CATH AND CORONARY ANGIOGRAPHY;  Surgeon: Corky Crafts, MD;  Location: [facility] INVASIVE CV LAB;  Service: Cardiovascular;  Laterality: N/A;   TONSILLECTOMY       Medications Prior to Admission  Medication Sig Dispense Refill Last Dose    acetaminophen (TYLENOL) 500 MG tablet Take 500 mg by mouth every 6 (six) hours as needed for moderate pain.   Past Week   albuterol (VENTOLIN HFA) 108 (90 Base) MCG/ACT inhaler Inhale 2 puffs into the lungs every 6 (six) hours as needed for wheezing or shortness of breath.   08/16/2023   aspirin EC 81 MG tablet Take 81 mg by mouth daily.   UNK   carbidopa-levodopa (SINEMET IR) 25-100 MG tablet TAKE 1.5 TABLETS BY MOUTH 3 (THREE) TIMES DAILY. 6AM/10-11AM/3-4PM 405 tablet 0 Past Week   chlorthalidone (HYGROTON) 25 MG tablet Take 25 mg by mouth daily.   Past Week   finasteride (PROSCAR) 5 MG tablet Take 1 tablet (5 mg total) by mouth daily. 90 tablet 3 Past Week   Glucose 15 g PACK Take 15 g by mouth as needed (hypoglycemia).   UNK   guaifenesin (ROBITUSSIN) 100 MG/5ML syrup Take 200 mg by mouth 3 (three) times daily as needed for cough.   08/16/2023   Insulin Aspart Prot & Aspart (NOVOLOG MIX 70/30 [place]) Inject 30 Units into the skin 2 (two) times daily.   Past Week   JARDIANCE 10 MG TABS tablet Take 10 mg by mouth daily.   Past Week   losartan (COZAAR) 100 MG tablet Take 1 tablet (100 mg total) by mouth daily. 90 tablet 1 Past Week   Metamucil Fiber CHEW Chew 1 each by mouth daily.   Past Week   metFORMIN (GLUCOPHAGE) 1000 MG tablet Take 1 tablet (1,000 mg total) by mouth 2 (two) times daily with a meal. 60 tablet 0 Past Week   omeprazole (PRILOSEC) 40 MG capsule Take 1 capsule (40 mg total) by mouth daily. 90 capsule 3 Past Week   Pseudoeph-Doxylamine-DM-APAP (NYQUIL PO) Take 30 mLs by mouth at bedtime as needed (cough).   Past Week   simvastatin (ZOCOR) 40 MG tablet Take 1 tablet (40 mg total) by mouth at bedtime. 90 tablet 3 Past Week   traMADol (ULTRAM) 50 MG tablet Take 50 mg by mouth every 6 (six) hours as needed (pain).   UNK    Inpatient Medications:   aspirin EC  81 mg Oral Daily   carbidopa-levodopa  1.5 tablet Oral TID   ciprofloxacin  1 drop Both Eyes Q4H while awake   finasteride  5 mg  Oral Daily   insulin aspart  0-5 Units Subcutaneous QHS   insulin aspart  0-9 Units Subcutaneous TID WC   melatonin  3 mg Oral QHS   pantoprazole  80 mg Oral Daily   QUEtiapine  25 mg Oral QHS   senna  1 tablet Oral BID   simvastatin  40 mg Oral QHS    Allergies: No Known Allergies  Family History  Problem Relation Age of Onset   Alzheimer's disease Mother    Heart disease Father  CHF   Stroke Father 21   Tremor Father    Tremor Sister    Pulmonary embolism Brother    Diabetes Brother    Heart disease Paternal Grandfather    Asthma Neg Hx    Colon cancer Neg Hx    Esophageal cancer Neg Hx    Pancreatic cancer Neg Hx    Prostate cancer Neg Hx    Rectal cancer Neg Hx    Stomach cancer Neg Hx      Physical Exam: Vitals:   08/17/23 1625 08/17/23 1911 08/18/23 0440 08/18/23 0747  BP: (!) 169/35 (!) 137/38 (!) 166/45 (!) 175/42  Pulse:  (!) 43 (!) 43 (!) 40  Resp: 18 20 18 18   Temp: 98 F (36.7 C)  98 F (36.7 C) 97.9 F (36.6 C)  TempSrc: Oral  Oral Oral  SpO2:  96% 95% 95%  Weight:      Height:        GEN- NAD, A&O x 3, normal affect HEENT: Normocephalic, atraumatic Lungs- CTAB, Normal effort.  Heart- Regular rate and rhythm, No M/G/R.  GI- Soft, NT, ND.  Extremities- No clubbing, cyanosis, or edema   Radiology/Studies: MR Brain Wo Contrast (neuro protocol)  Result Date: 08/17/2023 CLINICAL DATA:  Initial evaluation for neuro deficit, stroke suspected. EXAM: MRI HEAD WITHOUT CONTRAST TECHNIQUE: Multiplanar, multiecho pulse sequences of the brain and surrounding structures were obtained without intravenous contrast. COMPARISON:  None Available. FINDINGS: Brain: Examination moderately degraded by motion. Generalized age-related cerebral atrophy. Patchy T2/FLAIR hyperintensity involving the periventricular and deep white matter, most likely related chronic microvascular ischemic disease, mild for age. No evidence for acute or subacute ischemia. No visible  areas of chronic cortical infarction. No visible acute or chronic intracranial blood products, although the SWI sequences are severely degraded by motion. No mass lesion, midline shift or mass effect. No hydrocephalus or extra-axial fluid collection. Pituitary gland suprasellar region within normal limits. Vascular: Major intracranial vascular flow voids are maintained. Skull and upper cervical spine: Craniocervical junction within normal limits. Bone marrow signal intensity normal. No scalp soft tissue abnormality. Sinuses/Orbits: Prior bilateral ocular lens replacement. Moderate mucosal thickening present throughout the paranasal sinuses. No mastoid effusion. Other: None. IMPRESSION: 1. Motion degraded exam. 2. No acute intracranial abnormality. 3. Age-related cerebral atrophy with mild chronic small vessel ischemic disease. Electronically Signed   By: Rise Mu M.D.   On: 08/17/2023 04:11   DG Chest Portable 1 View  Result Date: 08/17/2023 CLINICAL DATA:  Shortness of breath, dizziness. EXAM: PORTABLE CHEST 1 VIEW COMPARISON:  02/03/2022. FINDINGS: The heart size and mediastinal contours are within normal limits. There is atherosclerotic calcification of the aorta. Lung volumes are low. No consolidation, effusion, or pneumothorax. No acute osseous abnormality. IMPRESSION: No active disease. Electronically Signed   By: Thornell Sartorius M.D.   On: 08/17/2023 02:14    EKG: on arrival shows 2:1 AV block in the 40s (personally reviewed)  EKG 10/10 is NOT consistent with AF.  With discrete P waves very frequent ectopy in the setting of his illness.  TELEMETRY: 2:1 AV block in the 40s, rare PVCs (personally reviewed)  Assessment/Plan:  2:1 AV block No reversible cause noted Progressive by EKGs and prior tele monitoring Normal EF and clean cath last year. Explained risks, benefits, and alternatives to PPM implantation, including but not limited to bleeding, infection, pneumothorax, pericardial  effusion, lead dislodgement, heart attack, stroke, or death.  Pt verbalized understanding and agrees to proceed.  H/o parkinsons disease  DM2 HTN Conjunctivitis  Per primary team.   Confusion Multifactorial Needs to avoid combination OTC drugs in the future (was taking Robitussin and Dayquil/Nyquil)   For questions or updates, please contact [facility] Please consult [email or web address] for contact info under Cardiology/STEMI.  Dustin Flock, PA-C  08/18/2023 8:57 AM  EP Attending  Patient seen and examined. Agree with above. The patient presents with symptomatic 2:1 AV block and RBBB and known AV conduction system disease on no AV node blocking drugs. In addition he has Parkinson's. He has conjunctivitis. He is not febrile and on exam he some conjunctival injection, a regular brady and clear lungs and no edema. Neuro does not show any tremors today. Tele demonstrates NSR with 2:1 AV block.   A/P Symptomatic heart block - I have discussed the indications/risks/benefits/goals/expectations of PPM insertion and he wishes to proceed.   Sharlot Gowda Shem Plemmons,MD

## 2023-08-19 DIAGNOSIS — J208 Acute bronchitis due to other specified organisms: Secondary | ICD-10-CM | POA: Diagnosis not present

## 2023-08-19 DIAGNOSIS — G9341 Metabolic encephalopathy: Secondary | ICD-10-CM | POA: Diagnosis not present

## 2023-08-19 DIAGNOSIS — R059 Cough, unspecified: Secondary | ICD-10-CM | POA: Diagnosis not present

## 2023-08-19 DIAGNOSIS — R410 Disorientation, unspecified: Secondary | ICD-10-CM | POA: Diagnosis not present

## 2023-08-19 DIAGNOSIS — I441 Atrioventricular block, second degree: Secondary | ICD-10-CM | POA: Diagnosis not present

## 2023-08-19 LAB — BASIC METABOLIC PANEL
Anion gap: 12 (ref 5–15)
BUN: 26 mg/dL — ABNORMAL HIGH (ref 8–23)
CO2: 22 mmol/L (ref 22–32)
Calcium: 9.9 mg/dL (ref 8.9–10.3)
Chloride: 106 mmol/L (ref 98–111)
Creatinine, Ser: 1.4 mg/dL — ABNORMAL HIGH (ref 0.61–1.24)
GFR, Estimated: 52 mL/min — ABNORMAL LOW (ref >60)
Glucose, Bld: 138 mg/dL — ABNORMAL HIGH (ref 70–99)
Potassium: 3.7 mmol/L (ref 3.5–5.1)
Sodium: 140 mmol/L (ref 135–145)

## 2023-08-19 LAB — CBC WITH DIFFERENTIAL/PLATELET
Abs Immature Granulocytes: 0.07 10*3/uL (ref 0.00–0.07)
Basophils Absolute: 0 10*3/uL (ref 0.0–0.1)
Basophils Relative: 1 %
Eosinophils Absolute: 0.1 10*3/uL (ref 0.0–0.5)
Eosinophils Relative: 1 %
HCT: 39.8 % (ref 39.0–52.0)
Hemoglobin: 13.5 g/dL (ref 13.0–17.0)
Immature Granulocytes: 1 %
Lymphocytes Relative: 15 %
Lymphs Abs: 1.3 10*3/uL (ref 0.7–4.0)
MCH: 27.7 pg (ref 26.0–34.0)
MCHC: 33.9 g/dL (ref 30.0–36.0)
MCV: 81.7 fL (ref 80.0–100.0)
Monocytes Absolute: 1.1 10*3/uL — ABNORMAL HIGH (ref 0.1–1.0)
Monocytes Relative: 13 %
Neutro Abs: 5.8 10*3/uL (ref 1.7–7.7)
Neutrophils Relative %: 69 %
Platelets: 269 10*3/uL (ref 150–400)
RBC: 4.87 MIL/uL (ref 4.22–5.81)
RDW: 15.7 % — ABNORMAL HIGH (ref 11.5–15.5)
WBC: 8.4 10*3/uL (ref 4.0–10.5)
nRBC: 0 % (ref 0.0–0.2)

## 2023-08-19 LAB — GLUCOSE, CAPILLARY
Glucose-Capillary: 122 mg/dL — ABNORMAL HIGH (ref 70–99)
Glucose-Capillary: 166 mg/dL — ABNORMAL HIGH (ref 70–99)
Glucose-Capillary: 221 mg/dL — ABNORMAL HIGH (ref 70–99)
Glucose-Capillary: 212 mg/dL — ABNORMAL HIGH (ref 70–99)

## 2023-08-19 MED ORDER — POLYETHYLENE GLYCOL 3350 17 G PO PACK
17.0000 g | PACK | Freq: Two times a day (BID) | ORAL | Status: AC
  Administered 2023-08-19 – 2023-08-20 (×2): 17 g via ORAL
  Filled 2023-08-19 (×4): qty 1

## 2023-08-19 MED ORDER — ALBUTEROL SULFATE (2.5 MG/3ML) 0.083% IN NEBU
2.5000 mg | INHALATION_SOLUTION | Freq: Four times a day (QID) | RESPIRATORY_TRACT | Status: DC
  Administered 2023-08-19: 2.5 mg via RESPIRATORY_TRACT
  Filled 2023-08-19: qty 3

## 2023-08-19 MED ORDER — POTASSIUM CHLORIDE CRYS ER 20 MEQ PO TBCR
20.0000 meq | EXTENDED_RELEASE_TABLET | Freq: Two times a day (BID) | ORAL | Status: DC
  Administered 2023-08-19 – 2023-08-25 (×13): 20 meq via ORAL
  Filled 2023-08-19 (×13): qty 1

## 2023-08-19 MED ORDER — ALBUTEROL SULFATE (2.5 MG/3ML) 0.083% IN NEBU
2.5000 mg | INHALATION_SOLUTION | RESPIRATORY_TRACT | Status: DC
  Administered 2023-08-19 – 2023-08-20 (×4): 2.5 mg via RESPIRATORY_TRACT
  Filled 2023-08-19 (×4): qty 3

## 2023-08-19 NOTE — Progress Notes (Signed)
[facility] PROGRESS NOTE    Progress Note  Karl Lawson.  ZOX:[id number] DOB: 01-23-1946 DOA: 08/16/2023 PCP: Mliss Sax, MD     Brief Narrative:   Karl Lawson. is an 77 y.o. male past medical history significant for obesity, diabetes mellitus type 2, essential hypertension COPD, recently seen in the ED for confusion, most of the history was obtained from the son he relates his mental status mostly intact as some examinations at times he relates this has been getting worse since his Sinemet was started.  His wife was sick and he saw her on 08/11/2023 started developing cough after this nonpurulent.  Started on Robitussin for which hallucinations got worse after this.  Then switch to NyQuil and he has not slept over the last 2 nights.  Chest x-ray in the ED was unremarkable, MRI of the brain showed no acute intracranial abnormalities she has some chronic changes ischemic of small vessels.  Started empirically on Rocephin and azithromycin we will hold this of Decadron.    Assessment/Plan:   Acute confusional state/acute metabolic encephalopathy: In the setting of probably medication use and probable infectious etiology.  Needs to avoid Robitussin and DayQuil as an outpatient. Try to potassium greater than 4 magnesium greater than 2. Start on melatonin at night as well as Seroquel. IV Haldol for agitation as needed. Start chest physiotherapy.  Fevers due to acute bronchitis: Question aspiration started on IV Unasyn and azithromycin.  Procalcitonin was low yield. Tmax of 102.2 has defervesced with IV Unasyn leukocytosis resolved. CT of the chest showed no infiltrate but does show possible acute bronchitis and mucous plugging. Blood cultures have been negative till date.  Asymptomatic bradycardia with a 2-1 heart block: Progressive by prior EKGs. EP was consulted and recommended pacer on 08/21/2023  Acute kidney injury: Likely prerenal started on IV fluids  recheck basic metabolic panel in the morning. Baseline creatinine around 1.  History of Parkinson disease: Continue Sinemet.  Purulent conjunctivitis:  Continue on ciprofloxacin eyedrop.  Controlled diabetes mellitus type 2: With an A1c of 6.1 continue current regimen, blood glucose fairly controlled.  Essential hypertension: Blood pressure significantly elevated he was started on losartan IV hydralazine as needed. Continue to titrate antihypertensive medication for goal blood pressure less than 160/100  Hyperlipidemia:  Continue statins.  COPD: Continue inhalers.  GERD: Continue PPI.  BPH: Continue Flomax and finasteride.   DVT prophylaxis: lovenox Family Communication:sister-in-law Status is: Observation The patient remains OBS appropriate and will d/c before 2 midnights.    Code Status:     Code Status Orders  (From admission, onward)           Start     Ordered   08/17/23 1115  Full code  Continuous       Question:  By:  Answer:  Consent: discussion documented in EHR   08/17/23 1116           Code Status History     Date Active Date Inactive Code Status Order ID Comments User Context   05/24/2022 0859 05/24/2022 1643 Full Code [id number]  Corky Crafts, MD Inpatient      Advance Directive Documentation    Flowsheet Row Most Recent Value  Type of Advance Directive Living will, Healthcare Power of Attorney  Pre-existing out of facility DNR order (yellow form or pink MOST form) --  "MOST" Form in Place? --         IV Access:   Peripheral IV  Procedures and diagnostic studies:   CT Chest Wo Contrast  Result Date: 08/18/2023 CLINICAL DATA:  Respiratory illness, shortness of breath EXAM: CT CHEST WITHOUT CONTRAST TECHNIQUE: Multidetector CT imaging of the chest was performed following the standard protocol without IV contrast. RADIATION DOSE REDUCTION: This exam was performed according to the departmental dose-optimization  program which includes automated exposure control, adjustment of the mA and/or kV according to patient size and/or use of iterative reconstruction technique. COMPARISON:  Chest x-ray 08/17/2023.  Chest CT 04/05/2013 FINDINGS: Cardiovascular: Heart is normal size. Aorta is normal caliber. Diffuse coronary artery atherosclerosis. Moderate aortic calcifications. Mediastinum/Nodes: No mediastinal, hilar, or axillary adenopathy. Trachea and esophagus are unremarkable. Thyroid unremarkable. Lungs/Pleura: Mild centrilobular emphysema. Mild peribronchial thickening in the lower lobes bilaterally with mucous plugging in several lower lobe airways. Ground-glass opacities in the lung bases, likely atelectasis. No effusions. Upper Abdomen: Multiple layering gallstones within the gallbladder. No acute findings. Musculoskeletal: Chest wall soft tissues are unremarkable. No acute bony abnormality. IMPRESSION: Peribronchial thickening and mucous plugging in the lower lobe airways compatible with bronchitis. Ground-glass opacities in the lung bases, likely atelectasis. Diffuse coronary artery disease. Aortic Atherosclerosis (ICD10-I70.0) and Emphysema (ICD10-J43.9). Electronically Signed   By: Charlett Nose M.D.   On: 08/18/2023 20:12   ABORTED INVASIVE LAB PROCEDURE  Result Date: 08/18/2023 This case was aborted.    Medical Consultants:   None.   Subjective:    Karl Lawson. is improved this morning.  Objective:    Vitals:   08/18/23 2316 08/19/23 0324 08/19/23 0809 08/19/23 0845  BP: (!) 168/53 (!) 169/49 (!) 200/49 (!) 175/46  Pulse: (!) 40 73 (!) 40 (!) 41  Resp: (!) 26 (!) 24 20   Temp: (!) 97.4 F (36.3 C) 97.6 F (36.4 C) 97.9 F (36.6 C)   TempSrc: Oral Oral Oral   SpO2: 97% 93% 100%   Weight:      Height:       SpO2: 100 % O2 Flow Rate (L/min): 2 L/min   Intake/Output Summary (Last 24 hours) at 08/19/2023 0945 Last data filed at 08/19/2023 0813 Gross per 24 hour  Intake 998.34 ml   Output 1250 ml  Net -251.66 ml   Filed Weights   08/16/23 1505  Weight: 95.3 kg    Exam: General exam: In no acute distress. Respiratory system: Good air movement and clear to auscultation. Cardiovascular system: S1 & S2 heard, RRR. No JVD. Gastrointestinal system: Abdomen is nondistended, soft and nontender.  Extremities: No pedal edema. Skin: No rashes, lesions or ulcers Psychiatry: Judgment and insight appear intact.   Data Reviewed:    Labs: Basic Metabolic Panel: Recent Labs  Lab 08/16/23 1517 08/17/23 0840 08/18/23 0420 08/19/23 0648  NA 137 140 141 140  K 3.7 3.6 3.9 3.7  CL 101 102 106 106  CO2 22 16* 20* 22  GLUCOSE 161* 199* 180* 138*  BUN 20 24* 32* 26*  CREATININE 1.24 1.26* 1.33* 1.40*  CALCIUM 10.0 9.9 10.0 9.9  MG  --   --  2.1  --    GFR Estimated Creatinine Clearance: 49.5 mL/min (A) (by C-G formula based on SCr of 1.4 mg/dL (H)). Liver Function Tests: Recent Labs  Lab 08/17/23 0840  AST 23  ALT 16  ALKPHOS 61  BILITOT 2.1*  PROT 6.2*  ALBUMIN 3.3*   No results for input(s): "LIPASE", "AMYLASE" in the last 168 hours. Recent Labs  Lab 08/17/23 0830  AMMONIA 32   Coagulation profile No  results for input(s): "INR", "PROTIME" in the last 168 hours. COVID-19 Labs  No results for input(s): "DDIMER", "FERRITIN", "LDH", "CRP" in the last 72 hours.  Lab Results  Component Value Date   SARSCOV2NAA NEGATIVE 08/17/2023    CBC: Recent Labs  Lab 08/16/23 1517 08/17/23 0831 08/18/23 0420 08/19/23 0648  WBC 10.8* 8.9 9.1 8.4  NEUTROABS  --  7.2  --  5.8  HGB 12.8* 12.5* 12.6* 13.5  HCT 40.1 39.9 38.8* 39.8  MCV 82.3 83.0 80.3 81.7  PLT 249 244 279 269   Cardiac Enzymes: No results for input(s): "CKTOTAL", "CKMB", "CKMBINDEX", "TROPONINI" in the last 168 hours. BNP (last 3 results) No results for input(s): "PROBNP" in the last 8760 hours. CBG: Recent Labs  Lab 08/18/23 0807 08/18/23 1208 08/18/23 1746 08/18/23 2046  08/19/23 0821  GLUCAP 181* 180* 144* 219* 122*   D-Dimer: No results for input(s): "DDIMER" in the last 72 hours. Hgb A1c: No results for input(s): "HGBA1C" in the last 72 hours. Lipid Profile: No results for input(s): "CHOL", "HDL", "LDLCALC", "TRIG", "CHOLHDL", "LDLDIRECT" in the last 72 hours. Thyroid function studies: Recent Labs    08/17/23 0830  TSH 1.745   Anemia work up: No results for input(s): "VITAMINB12", "FOLATE", "FERRITIN", "TIBC", "IRON", "RETICCTPCT" in the last 72 hours. Sepsis Labs: Recent Labs  Lab 08/16/23 1517 08/17/23 0831 08/18/23 0420 08/18/23 1556 08/19/23 0648  PROCALCITON  --   --   --  0.21  --   WBC 10.8* 8.9 9.1  --  8.4   Microbiology Recent Results (from the past 240 hour(s))  Resp panel by RT-PCR (RSV, Flu A&B, Covid) Anterior Nasal Swab     Status: None   Collection Time: 08/17/23  2:13 AM   Specimen: Anterior Nasal Swab  Result Value Ref Range Status   SARS Coronavirus 2 by RT PCR NEGATIVE NEGATIVE Final   Influenza A by PCR NEGATIVE NEGATIVE Final   Influenza B by PCR NEGATIVE NEGATIVE Final    Comment: (NOTE) The Xpert Xpress SARS-CoV-2/FLU/RSV plus assay is intended as an aid in the diagnosis of influenza from Nasopharyngeal swab specimens and should not be used as a sole basis for treatment. Nasal washings and aspirates are unacceptable for Xpert Xpress SARS-CoV-2/FLU/RSV testing.  Fact Sheet for Patients: BloggerCourse.com  Fact Sheet for Healthcare Providers: SeriousBroker.it  This test is not yet approved or cleared by the [place] FDA and has been authorized for detection and/or diagnosis of SARS-CoV-2 by FDA under an Emergency Use Authorization (EUA). This EUA will remain in effect (meaning this test can be used) for the duration of the COVID-19 declaration under Section 564(b)(1) of the Act, 21 U.S.C. section 360bbb-3(b)(1), unless the authorization is terminated  or revoked.     Resp Syncytial Virus by PCR NEGATIVE NEGATIVE Final    Comment: (NOTE) Fact Sheet for Patients: BloggerCourse.com  Fact Sheet for Healthcare Providers: SeriousBroker.it  This test is not yet approved or cleared by the [place] FDA and has been authorized for detection and/or diagnosis of SARS-CoV-2 by FDA under an Emergency Use Authorization (EUA). This EUA will remain in effect (meaning this test can be used) for the duration of the COVID-19 declaration under Section 564(b)(1) of the Act, 21 U.S.C. section 360bbb-3(b)(1), unless the authorization is terminated or revoked.  Performed at [facility], [address] [place], [place] [address]   Surgical PCR screen     Status: None   Collection Time: 08/18/23 12:18 PM  Specimen: Nasal Mucosa; Nasal Swab  Result Value Ref Range Status   MRSA, PCR NEGATIVE NEGATIVE Final   Staphylococcus aureus NEGATIVE NEGATIVE Final    Comment: (NOTE) The Xpert SA Assay (FDA approved for NASAL specimens in patients 78 years of age and older), is one component of a comprehensive surveillance program. It is not intended to diagnose infection nor to guide or monitor treatment. Performed at [facility], [address], [place], [place] [address]      Medications:    aspirin EC  81 mg Oral Daily   azithromycin  500 mg Oral Daily   carbidopa-levodopa  1.5 tablet Oral TID   chlorthalidone  25 mg Oral Daily   ciprofloxacin  1 drop Both Eyes Q4H while awake   empagliflozin  10 mg Oral Daily   finasteride  5 mg Oral Daily   guaiFENesin  600 mg Oral BID   insulin aspart  0-5 Units Subcutaneous QHS   insulin aspart  0-9 Units Subcutaneous TID WC   losartan  100 mg Oral Daily   melatonin  3 mg Oral QHS   pantoprazole  80 mg Oral Daily   QUEtiapine  25 mg Oral QHS   senna  1 tablet Oral BID   simvastatin  40 mg Oral QHS   Continuous Infusions:   ampicillin-sulbactam (UNASYN) IV 1.5 g (08/19/23 0335)      LOS: 1 day   Marinda Elk  [facility]  08/19/2023, 9:45 AM

## 2023-08-19 NOTE — Plan of Care (Signed)

## 2023-08-19 NOTE — Progress Notes (Signed)
   Rounding Note    Patient Name: Karl Lawson. Date of Encounter: 08/19/2023  [facility] Cardiologist: Meriam Sprague, MD   Subjective   NAEO. Woke up confused, now thinking is clear.   Vital Signs    Vitals:   08/18/23 2316 08/19/23 0324 08/19/23 0809 08/19/23 0845  BP: (!) 168/53 (!) 169/49 (!) 200/49 (!) 175/46  Pulse: (!) 40 73 (!) 40 (!) 41  Resp: (!) 26 (!) 24 20   Temp: (!) 97.4 F (36.3 C) 97.6 F (36.4 C) 97.9 F (36.6 C)   TempSrc: Oral Oral Oral   SpO2: 97% 93% 100%   Weight:      Height:        Intake/Output Summary (Last 24 hours) at 08/19/2023 0908 Last data filed at 08/19/2023 0813 Gross per 24 hour  Intake 998.34 ml  Output 1250 ml  Net -251.66 ml      08/16/2023    3:05 PM 07/18/2023   10:31 AM 07/03/2023    3:37 PM  Last 3 Weights  Weight (lbs) 210 lb 215 lb 6.4 oz 215 lb  Weight (kg) 95.255 kg 97.705 kg 97.523 kg      Telemetry    2:1 AVB - Personally Reviewed  ECG    Personally Reviewed  Physical Exam   GEN: No acute distress.  Elderly. Oriented this AM. Cardiac: RRR, no murmurs, rubs, or gallops.  Respiratory: Clear to auscultation bilaterally. Psych: Normal affect   Assessment & Plan    #2:1 AVB Planning for PPM prior to discharge once fever/?infx clears  #Fever Unclear cause. Possible aspiration. Appreciate IM team care. On abx.   Sheria Lang T. Lalla Brothers, MD, [facility], [facility] Cardiac Electrophysiology

## 2023-08-20 DIAGNOSIS — J22 Unspecified acute lower respiratory infection: Secondary | ICD-10-CM

## 2023-08-20 DIAGNOSIS — I441 Atrioventricular block, second degree: Secondary | ICD-10-CM | POA: Diagnosis not present

## 2023-08-20 DIAGNOSIS — R410 Disorientation, unspecified: Secondary | ICD-10-CM | POA: Diagnosis not present

## 2023-08-20 DIAGNOSIS — R059 Cough, unspecified: Secondary | ICD-10-CM | POA: Diagnosis not present

## 2023-08-20 DIAGNOSIS — I442 Atrioventricular block, complete: Secondary | ICD-10-CM | POA: Diagnosis not present

## 2023-08-20 LAB — BASIC METABOLIC PANEL
Anion gap: 14 (ref 5–15)
BUN: 20 mg/dL (ref 8–23)
CO2: 20 mmol/L — ABNORMAL LOW (ref 22–32)
Calcium: 10 mg/dL (ref 8.9–10.3)
Chloride: 106 mmol/L (ref 98–111)
Creatinine, Ser: 1.12 mg/dL (ref 0.61–1.24)
GFR, Estimated: >60 mL/min (ref >60)
Glucose, Bld: 165 mg/dL — ABNORMAL HIGH (ref 70–99)
Potassium: 3.8 mmol/L (ref 3.5–5.1)
Sodium: 140 mmol/L (ref 135–145)

## 2023-08-20 LAB — GLUCOSE, CAPILLARY
Glucose-Capillary: 177 mg/dL — ABNORMAL HIGH (ref 70–99)
Glucose-Capillary: 249 mg/dL — ABNORMAL HIGH (ref 70–99)
Glucose-Capillary: 203 mg/dL — ABNORMAL HIGH (ref 70–99)
Glucose-Capillary: 197 mg/dL — ABNORMAL HIGH (ref 70–99)

## 2023-08-20 LAB — LEGIONELLA PNEUMOPHILA SEROGP 1 UR AG: L. pneumophila Serogp 1 Ur Ag: NEGATIVE

## 2023-08-20 MED ORDER — BENZONATATE 100 MG PO CAPS
100.0000 mg | ORAL_CAPSULE | Freq: Three times a day (TID) | ORAL | Status: DC
  Administered 2023-08-20 – 2023-08-25 (×18): 100 mg via ORAL
  Filled 2023-08-20 (×19): qty 1

## 2023-08-20 MED ORDER — AMLODIPINE BESYLATE 5 MG PO TABS
5.0000 mg | ORAL_TABLET | Freq: Every day | ORAL | Status: DC
  Administered 2023-08-20: 5 mg via ORAL
  Filled 2023-08-20: qty 1

## 2023-08-20 MED ORDER — CHLORTHALIDONE 25 MG PO TABS
25.0000 mg | ORAL_TABLET | Freq: Every day | ORAL | Status: DC
  Administered 2023-08-21 – 2023-08-25 (×5): 25 mg via ORAL
  Filled 2023-08-20 (×5): qty 1

## 2023-08-20 MED ORDER — HYDROCHLOROTHIAZIDE 25 MG PO TABS: 25.0000 mg | ORAL_TABLET | Freq: Once | ORAL | Status: DC

## 2023-08-20 MED ORDER — ATORVASTATIN CALCIUM 40 MG PO TABS
40.0000 mg | ORAL_TABLET | Freq: Every day | ORAL | Status: DC
  Administered 2023-08-21 – 2023-08-25 (×5): 40 mg via ORAL
  Filled 2023-08-20 (×5): qty 1

## 2023-08-20 MED ORDER — AMLODIPINE BESYLATE 10 MG PO TABS
10.0000 mg | ORAL_TABLET | Freq: Every day | ORAL | Status: DC
  Administered 2023-08-21 – 2023-08-25 (×5): 10 mg via ORAL
  Filled 2023-08-20 (×5): qty 1

## 2023-08-20 MED ORDER — HYDRALAZINE HCL 20 MG/ML IJ SOLN
10.0000 mg | INTRAMUSCULAR | Status: DC | PRN
  Administered 2023-08-21 (×2): 10 mg via INTRAVENOUS
  Filled 2023-08-20 (×2): qty 1

## 2023-08-20 MED ORDER — CHLORTHALIDONE 50 MG PO TABS: 50.0000 mg | ORAL_TABLET | Freq: Every day | ORAL | Status: DC

## 2023-08-20 MED ORDER — ALBUTEROL SULFATE (2.5 MG/3ML) 0.083% IN NEBU
2.5000 mg | INHALATION_SOLUTION | Freq: Four times a day (QID) | RESPIRATORY_TRACT | Status: DC
  Administered 2023-08-20 – 2023-08-24 (×16): 2.5 mg via RESPIRATORY_TRACT
  Filled 2023-08-20 (×16): qty 3

## 2023-08-20 NOTE — Plan of Care (Signed)

## 2023-08-20 NOTE — Progress Notes (Signed)
  Pharmacy informing that there is high risk for muscular toxicity with a combination of amlodipine and simvastatin.  I have switched simvastatin to atorvastatin.  Tereasa Coop, MD [facility] 08/20/2023, 10:33 PM

## 2023-08-20 NOTE — Progress Notes (Signed)
[facility] PROGRESS NOTE    Progress Note  Karl Lawson.  WGN:[id number] DOB: Dec 18, 1945 DOA: 08/16/2023 PCP: Mliss Sax, MD     Brief Narrative:   Karl Lawson. is an 77 y.o. male past medical history significant for obesity, diabetes mellitus type 2, essential hypertension COPD, recently seen in the ED for confusion, most of the history was obtained from the son he relates his mental status mostly intact as some examinations at times he relates this has been getting worse since his Sinemet was started.  His wife was sick and he saw her on 08/11/2023 started developing cough after this nonpurulent.  Started on Robitussin for which hallucinations got worse after this.  Then switch to NyQuil and he has not slept over the last 2 nights.  Chest x-ray in the ED was unremarkable, MRI of the brain showed no acute intracranial abnormalities she has some chronic changes ischemic of small vessels.  Started empirically on Rocephin and azithromycin we will hold this of Decadron.    Assessment/Plan:   Acute confusional state/acute metabolic encephalopathy: In the setting of probably medication use and probable infectious etiology.  Needs to avoid Robitussin and DayQuil as an outpatient. Try to potassium greater than 4 magnesium greater than 2. Continue chest physiotherapy.  Fevers due to acute bronchitis/aspiration: Question aspiration started on IV Unasyn and azithromycin.  Has defervesced leukocytosis improved. Continue IV Unasyn and azithromycin. Blood cultures negative till date.  Asymptomatic bradycardia with a 2-1 heart block: Progressive by prior EKGs. EP was consulted and recommended pacer on 08/21/2023  Acute kidney injury: Likely prerenal started on IV fluids recheck basic metabolic panel in the morning. Baseline creatinine around 1.  History of Parkinson disease: Continue Sinemet.  Purulent conjunctivitis:  Continue on ciprofloxacin  eyedrop.  Controlled diabetes mellitus type 2: With an A1c of 6.1 continue current regimen, blood glucose fairly controlled.  Essential hypertension: Blood pressure continues to be elevated, he is currently on chlorthalidone and losartan. Add Norvasc continue hydralazine IV as needed.  Hyperlipidemia:  Continue statins.  COPD: Continue inhalers.  GERD: Continue PPI.  BPH: Continue Flomax and finasteride.   DVT prophylaxis: lovenox Family Communication:sister-in-law Status is: Observation The patient remains OBS appropriate and will d/c before 2 midnights.    Code Status:     Code Status Orders  (From admission, onward)           Start     Ordered   08/17/23 1115  Full code  Continuous       Question:  By:  Answer:  Consent: discussion documented in EHR   08/17/23 1116           Code Status History     Date Active Date Inactive Code Status Order ID Comments User Context   05/24/2022 0859 05/24/2022 1643 Full Code [id number]  Corky Crafts, MD Inpatient      Advance Directive Documentation    Flowsheet Row Most Recent Value  Type of Advance Directive Living will, Healthcare Power of Attorney  Pre-existing out of facility DNR order (yellow form or pink MOST form) --  "MOST" Form in Place? --         IV Access:   Peripheral IV   Procedures and diagnostic studies:   CT Chest Wo Contrast  Result Date: 08/18/2023 CLINICAL DATA:  Respiratory illness, shortness of breath EXAM: CT CHEST WITHOUT CONTRAST TECHNIQUE: Multidetector CT imaging of the chest was performed following the standard protocol without IV contrast.  RADIATION DOSE REDUCTION: This exam was performed according to the departmental dose-optimization program which includes automated exposure control, adjustment of the mA and/or kV according to patient size and/or use of iterative reconstruction technique. COMPARISON:  Chest x-ray 08/17/2023.  Chest CT 04/05/2013 FINDINGS:  Cardiovascular: Heart is normal size. Aorta is normal caliber. Diffuse coronary artery atherosclerosis. Moderate aortic calcifications. Mediastinum/Nodes: No mediastinal, hilar, or axillary adenopathy. Trachea and esophagus are unremarkable. Thyroid unremarkable. Lungs/Pleura: Mild centrilobular emphysema. Mild peribronchial thickening in the lower lobes bilaterally with mucous plugging in several lower lobe airways. Ground-glass opacities in the lung bases, likely atelectasis. No effusions. Upper Abdomen: Multiple layering gallstones within the gallbladder. No acute findings. Musculoskeletal: Chest wall soft tissues are unremarkable. No acute bony abnormality. IMPRESSION: Peribronchial thickening and mucous plugging in the lower lobe airways compatible with bronchitis. Ground-glass opacities in the lung bases, likely atelectasis. Diffuse coronary artery disease. Aortic Atherosclerosis (ICD10-I70.0) and Emphysema (ICD10-J43.9). Electronically Signed   By: Charlett Nose M.D.   On: 08/18/2023 20:12   ABORTED INVASIVE LAB PROCEDURE  Result Date: 08/18/2023 This case was aborted.    Medical Consultants:   None.   Subjective:    Karl Lawson. feels much better.   Objective:    Vitals:   08/20/23 0029 08/20/23 0410 08/20/23 0718 08/20/23 0741  BP:  (!) 180/56  (!) 181/71  Pulse: (!) 42 78  71  Resp: 16 18  18   Temp:  98.2 F (36.8 C)  97.6 F (36.4 C)  TempSrc:  Oral  Oral  SpO2:  97% 99% 99%  Weight:      Height:       SpO2: 99 % O2 Flow Rate (L/min): 2 L/min   Intake/Output Summary (Last 24 hours) at 08/20/2023 0938 Last data filed at 08/20/2023 0752 Gross per 24 hour  Intake --  Output 3200 ml  Net -3200 ml   Filed Weights   08/16/23 1505  Weight: 95.3 kg    Exam: General exam: In no acute distress. Respiratory system: Good air movement and clear to auscultation. Cardiovascular system: S1 & S2 heard, RRR. No JVD. Gastrointestinal system: Abdomen is nondistended,  soft and nontender.  Extremities: No pedal edema. Skin: No rashes, lesions or ulcers Psychiatry: Judgment and insight are intact.  Data Reviewed:    Labs: Basic Metabolic Panel: Recent Labs  Lab 08/16/23 1517 08/17/23 0840 08/18/23 0420 08/19/23 0648 08/20/23 0501  NA 137 140 141 140 140  K 3.7 3.6 3.9 3.7 3.8  CL 101 102 106 106 106  CO2 22 16* 20* 22 20*  GLUCOSE 161* 199* 180* 138* 165*  BUN 20 24* 32* 26* 20  CREATININE 1.24 1.26* 1.33* 1.40* 1.12  CALCIUM 10.0 9.9 10.0 9.9 10.0  MG  --   --  2.1  --   --    GFR Estimated Creatinine Clearance: 61.9 mL/min (by C-G formula based on SCr of 1.12 mg/dL). Liver Function Tests: Recent Labs  Lab 08/17/23 0840  AST 23  ALT 16  ALKPHOS 61  BILITOT 2.1*  PROT 6.2*  ALBUMIN 3.3*   No results for input(s): "LIPASE", "AMYLASE" in the last 168 hours. Recent Labs  Lab 08/17/23 0830  AMMONIA 32   Coagulation profile No results for input(s): "INR", "PROTIME" in the last 168 hours. COVID-19 Labs  No results for input(s): "DDIMER", "FERRITIN", "LDH", "CRP" in the last 72 hours.  Lab Results  Component Value Date   SARSCOV2NAA NEGATIVE 08/17/2023    CBC: Recent Labs  Lab 08/16/23 1517 08/17/23 0831 08/18/23 0420 08/19/23 0648  WBC 10.8* 8.9 9.1 8.4  NEUTROABS  --  7.2  --  5.8  HGB 12.8* 12.5* 12.6* 13.5  HCT 40.1 39.9 38.8* 39.8  MCV 82.3 83.0 80.3 81.7  PLT 249 244 279 269   Cardiac Enzymes: No results for input(s): "CKTOTAL", "CKMB", "CKMBINDEX", "TROPONINI" in the last 168 hours. BNP (last 3 results) No results for input(s): "PROBNP" in the last 8760 hours. CBG: Recent Labs  Lab 08/19/23 0821 08/19/23 1210 08/19/23 2054 08/19/23 2230 08/20/23 0739  GLUCAP 122* 166* 221* 212* 177*   D-Dimer: No results for input(s): "DDIMER" in the last 72 hours. Hgb A1c: No results for input(s): "HGBA1C" in the last 72 hours. Lipid Profile: No results for input(s): "CHOL", "HDL", "LDLCALC", "TRIG",  "CHOLHDL", "LDLDIRECT" in the last 72 hours. Thyroid function studies: No results for input(s): "TSH", "T4TOTAL", "T3FREE", "THYROIDAB" in the last 72 hours.  Invalid input(s): "FREET3"  Anemia work up: No results for input(s): "VITAMINB12", "FOLATE", "FERRITIN", "TIBC", "IRON", "RETICCTPCT" in the last 72 hours. Sepsis Labs: Recent Labs  Lab 08/16/23 1517 08/17/23 0831 08/18/23 0420 08/18/23 1556 08/19/23 0648  PROCALCITON  --   --   --  0.21  --   WBC 10.8* 8.9 9.1  --  8.4   Microbiology Recent Results (from the past 240 hour(s))  Resp panel by RT-PCR (RSV, Flu A&B, Covid) Anterior Nasal Swab     Status: None   Collection Time: 08/17/23  2:13 AM   Specimen: Anterior Nasal Swab  Result Value Ref Range Status   SARS Coronavirus 2 by RT PCR NEGATIVE NEGATIVE Final   Influenza A by PCR NEGATIVE NEGATIVE Final   Influenza B by PCR NEGATIVE NEGATIVE Final    Comment: (NOTE) The Xpert Xpress SARS-CoV-2/FLU/RSV plus assay is intended as an aid in the diagnosis of influenza from Nasopharyngeal swab specimens and should not be used as a sole basis for treatment. Nasal washings and aspirates are unacceptable for Xpert Xpress SARS-CoV-2/FLU/RSV testing.  Fact Sheet for Patients: BloggerCourse.com  Fact Sheet for Healthcare Providers: SeriousBroker.it  This test is not yet approved or cleared by the [place] FDA and has been authorized for detection and/or diagnosis of SARS-CoV-2 by FDA under an Emergency Use Authorization (EUA). This EUA will remain in effect (meaning this test can be used) for the duration of the COVID-19 declaration under Section 564(b)(1) of the Act, 21 U.S.C. section 360bbb-3(b)(1), unless the authorization is terminated or revoked.     Resp Syncytial Virus by PCR NEGATIVE NEGATIVE Final    Comment: (NOTE) Fact Sheet for Patients: BloggerCourse.com  Fact Sheet for  Healthcare Providers: SeriousBroker.it  This test is not yet approved or cleared by the [place] FDA and has been authorized for detection and/or diagnosis of SARS-CoV-2 by FDA under an Emergency Use Authorization (EUA). This EUA will remain in effect (meaning this test can be used) for the duration of the COVID-19 declaration under Section 564(b)(1) of the Act, 21 U.S.C. section 360bbb-3(b)(1), unless the authorization is terminated or revoked.  Performed at [facility], [address], [place], [place] [address]   Surgical PCR screen     Status: None   Collection Time: 08/18/23 12:18 PM   Specimen: Nasal Mucosa; Nasal Swab  Result Value Ref Range Status   MRSA, PCR NEGATIVE NEGATIVE Final   Staphylococcus aureus NEGATIVE NEGATIVE Final    Comment: (NOTE) The Xpert SA Assay (FDA approved for NASAL specimens  in patients 35 years of age and older), is one component of a comprehensive surveillance program. It is not intended to diagnose infection nor to guide or monitor treatment. Performed at [facility], [address] [place], [place] [address]   Culture, blood (Routine X 2) w Reflex to ID Panel     Status: None (Preliminary result)   Collection Time: 08/18/23  3:56 PM   Specimen: BLOOD RIGHT ARM  Result Value Ref Range Status   Specimen Description BLOOD RIGHT ARM  Final   Special Requests   Final    BOTTLES DRAWN AEROBIC AND ANAEROBIC Blood Culture adequate volume   Culture   Final    NO GROWTH 2 DAYS Performed at [facility], [address], [place], [place] [address]    Report Status PENDING  Incomplete  Culture, blood (Routine X 2) w Reflex to ID Panel     Status: None (Preliminary result)   Collection Time: 08/18/23  3:58 PM   Specimen: BLOOD RIGHT ARM  Result Value Ref Range Status   Specimen Description BLOOD RIGHT ARM  Final   Special Requests   Final    BOTTLES DRAWN AEROBIC AND ANAEROBIC Blood Culture  adequate volume   Culture   Final    NO GROWTH 2 DAYS Performed at [facility], [address] [place], [place] [address]    Report Status PENDING  Incomplete     Medications:    albuterol  2.5 mg Nebulization Q4H   aspirin EC  81 mg Oral Daily   benzonatate  100 mg Oral TID   carbidopa-levodopa  1.5 tablet Oral TID   chlorthalidone  25 mg Oral Daily   ciprofloxacin  1 drop Both Eyes Q4H while awake   empagliflozin  10 mg Oral Daily   finasteride  5 mg Oral Daily   guaiFENesin  600 mg Oral BID   insulin aspart  0-5 Units Subcutaneous QHS   insulin aspart  0-9 Units Subcutaneous TID WC   losartan  100 mg Oral Daily   melatonin  3 mg Oral QHS   pantoprazole  80 mg Oral Daily   polyethylene glycol  17 g Oral BID   potassium chloride  20 mEq Oral BID   simvastatin  40 mg Oral QHS   Continuous Infusions:  ampicillin-sulbactam (UNASYN) IV 1.5 g (08/20/23 0408)      LOS: 2 days   Marinda Elk  [facility]  08/20/2023, 9:38 AM

## 2023-08-20 NOTE — Progress Notes (Addendum)
  Hypertensive crisis Essential hypertension Bedside nurse reported that patient blood pressure found elevated 214/70.  Around 8 PM.  Patient was given hydralazine 10 mg IV blood pressure at 9:15 PM is 190/71. -Per chart review throughout the day patient has uncontrolled hypertension and Norvasc has been added with his existing blood pressure regimen chlorthalidone and losartan. -Currently on amlodipine 5 mg, losartan 100 mg and hydrochlorothiazide 25 mg.   -Increasing dose of amlodipine to 10 mg from tomorrow. - Continue hydralazine 10 mg every 4 hour as needed. -Need to improve blood pressure gradually   Tereasa Coop, MD [facility] 08/20/2023, 9:39 PM

## 2023-08-20 NOTE — Progress Notes (Signed)
   Rounding Note    Patient Name: Karl Lawson. Date of Encounter: 08/20/2023  [facility] Cardiologist: Meriam Sprague, MD   Subjective   NAEO.  Eating breakfast this AM. Oriented this AM.  Vital Signs    Vitals:   08/20/23 0029 08/20/23 0410 08/20/23 0718 08/20/23 0741  BP:  (!) 180/56  (!) 181/71  Pulse: (!) 42 78  71  Resp: 16 18  18   Temp:  98.2 F (36.8 C)  97.6 F (36.4 C)  TempSrc:  Oral  Oral  SpO2:  97% 99% 99%  Weight:      Height:        Intake/Output Summary (Last 24 hours) at 08/20/2023 0829 Last data filed at 08/20/2023 0752 Gross per 24 hour  Intake --  Output 3200 ml  Net -3200 ml      08/16/2023    3:05 PM 07/18/2023   10:31 AM 07/03/2023    3:37 PM  Last 3 Weights  Weight (lbs) 210 lb 215 lb 6.4 oz 215 lb  Weight (kg) 95.255 kg 97.705 kg 97.523 kg      Telemetry   1:1 conduction this AM - Personally Reviewed  ECG    Personally Reviewed  Physical Exam   GEN: No acute distress.  Elderly. Oriented this AM. Cardiac: RRR, no murmurs, rubs, or gallops.  Respiratory: Clear to auscultation bilaterally. Psych: Normal affect   Assessment & Plan    #2:1 AVB Planning for PPM prior to discharge once fever/?infx clears  #Fever Unclear cause. Possible aspiration. Appreciate IM team care. On abx.   Sheria Lang T. Lalla Brothers, MD, [facility], [facility] Cardiac Electrophysiology

## 2023-08-21 ENCOUNTER — Telehealth: Payer: Self-pay | Admitting: Neurology

## 2023-08-21 DIAGNOSIS — I442 Atrioventricular block, complete: Secondary | ICD-10-CM | POA: Diagnosis not present

## 2023-08-21 DIAGNOSIS — J22 Unspecified acute lower respiratory infection: Secondary | ICD-10-CM | POA: Diagnosis not present

## 2023-08-21 DIAGNOSIS — R410 Disorientation, unspecified: Secondary | ICD-10-CM | POA: Diagnosis not present

## 2023-08-21 DIAGNOSIS — R059 Cough, unspecified: Secondary | ICD-10-CM | POA: Diagnosis not present

## 2023-08-21 LAB — GLUCOSE, CAPILLARY
Glucose-Capillary: 193 mg/dL — ABNORMAL HIGH (ref 70–99)
Glucose-Capillary: 183 mg/dL — ABNORMAL HIGH (ref 70–99)
Glucose-Capillary: 196 mg/dL — ABNORMAL HIGH (ref 70–99)
Glucose-Capillary: 172 mg/dL — ABNORMAL HIGH (ref 70–99)
Glucose-Capillary: 122 mg/dL — ABNORMAL HIGH (ref 70–99)

## 2023-08-21 MED ORDER — HYDRALAZINE HCL 10 MG PO TABS
10.0000 mg | ORAL_TABLET | Freq: Three times a day (TID) | ORAL | Status: DC
  Administered 2023-08-21 – 2023-08-25 (×11): 10 mg via ORAL
  Filled 2023-08-21 (×13): qty 1

## 2023-08-21 MED ORDER — HALOPERIDOL LACTATE 5 MG/ML IJ SOLN
1.0000 mg | Freq: Once | INTRAMUSCULAR | Status: AC
  Administered 2023-08-21: 1 mg via INTRAVENOUS
  Filled 2023-08-21: qty 1

## 2023-08-21 MED ORDER — YOU HAVE A PACEMAKER BOOK
Freq: Once | Status: AC
  Filled 2023-08-21: qty 1

## 2023-08-21 MED ORDER — MELATONIN 5 MG PO TABS
5.0000 mg | ORAL_TABLET | Freq: Every day | ORAL | Status: DC
  Administered 2023-08-21 – 2023-08-24 (×4): 5 mg via ORAL
  Filled 2023-08-21 (×4): qty 1

## 2023-08-21 MED ORDER — ENOXAPARIN SODIUM 40 MG/0.4ML IJ SOSY: 40.0000 mg | PREFILLED_SYRINGE | INTRAMUSCULAR | Status: DC

## 2023-08-21 NOTE — Progress Notes (Signed)
Delirium: -Patient is confused and taking off his clothing.  He is trying to take of his leads of the cardiac monitor - Wanted to implicate bedside sitter but bedside nurse reporting that there will be no bedside sitter will be available at this time. -Continue delirium precaution - Continue environmental support for delirium: Lights on during the day, patient up and out of bed as much as is feasible, OT/PT, quiet dimly lit room at night, reorient patient often, provide hearing aides and glasses if patient uses them routinely, minimize sleep disruptions as much as possible overnight.  As much as possible, reorient patient, and have them engage patient in activities, e.g. playing cards. TV should be off or on neutral background music unless patient engaged and watching. Try to keep interactions with the patient calm and quiet.    - Please keep in mind that although delirium is a reversible cause of altered mental status, patients with delirium are prone to unpredictable behavior that make them a potential risk to their own safety as well as safety of others.  -Giving Haldol 1 mg. -Given patient has bradycardia with 2: 1 AV block holding Seroquel at this time.  Tereasa Coop, MD [facility] 08/21/2023, 2:23 AM

## 2023-08-21 NOTE — Progress Notes (Signed)
[facility] PROGRESS NOTE    Progress Note  Karl Lawson.  ZOX:[id number] DOB: 07/04/1946 DOA: 08/16/2023 PCP: Mliss Sax, MD     Brief Narrative:   Karl Lawson. is an 77 y.o. male past medical history significant for obesity, diabetes mellitus type 2, essential hypertension COPD, recently seen in the ED for confusion, most of the history was obtained from the son he relates his mental status mostly intact as some examinations at times he relates this has been getting worse since his Sinemet was started.  His wife was sick and he saw her on 08/11/2023 started developing cough after this nonpurulent.  Started on Robitussin for which hallucinations got worse after this.  Then switch to NyQuil and he has not slept over the last 2 nights.  Chest x-ray in the ED was unremarkable, MRI of the brain showed no acute intracranial abnormalities she has some chronic changes ischemic of small vessels.  Started empirically on Rocephin and azithromycin we will hold this of Decadron.  Assessment/Plan:   Acute confusional state/acute metabolic encephalopathy: In the setting of probably medication use and probable infectious etiology.  Needs to avoid Robitussin and DayQuil as an outpatient. Try to potassium greater than 4 magnesium greater than 2. Continue chest physiotherapy. Became confused overnight his mentation is fluctuating throughout the day. Increase melatonin.  Fevers due to acute bronchitis/aspiration: Continue IV Unasyn has defervesced, blood cultures negative till date. Will complete a 5-day course. Continue chest physiotherapy.  Asymptomatic bradycardia with a 2-1 heart block: Progressive by prior EKGs. EP was consulted hopefully pacer soon.  Acute kidney injury: Likely prerenal, resolved with IV fluids. Baseline creatinine around 1.  History of Parkinson disease: Continue Sinemet.  Purulent conjunctivitis:  Continue on ciprofloxacin  eyedrop.  Controlled diabetes mellitus type 2: With an A1c of 6.1 continue current regimen, blood glucose fairly controlled.  Essential hypertension: Blood pressure continues to be elevated, he is currently on chlorthalidone and losartan. Add Norvasc continue hydralazine IV as needed.  Hyperlipidemia:  Continue statins.  COPD: Continue inhalers.  GERD: Continue PPI.  BPH: Continue Flomax and finasteride.   DVT prophylaxis: lovenox Family Communication:sister-in-law Status is: Observation The patient remains OBS appropriate and will d/c before 2 midnights.    Code Status:     Code Status Orders  (From admission, onward)           Start     Ordered   08/17/23 1115  Full code  Continuous       Question:  By:  Answer:  Consent: discussion documented in EHR   08/17/23 1116           Code Status History     Date Active Date Inactive Code Status Order ID Comments User Context   05/24/2022 0859 05/24/2022 1643 Full Code [id number]  Corky Crafts, MD Inpatient      Advance Directive Documentation    Flowsheet Row Most Recent Value  Type of Advance Directive Living will, Healthcare Power of Attorney  Pre-existing out of facility DNR order (yellow form or pink MOST form) --  "MOST" Form in Place? --         IV Access:   Peripheral IV   Procedures and diagnostic studies:   No results found.   Medical Consultants:   None.   Subjective:    Karl Lawson. relates he is not having good night sleep otherwise feels better.  Objective:    Vitals:   08/20/23 2116  08/20/23 2347 08/21/23 0101 08/21/23 0353  BP:  (!) 174/58  (!) 171/60  Pulse: (!) 103 69  87  Resp:  (!) 24  (!) 24  Temp:  98.4 F (36.9 C)  98.6 F (37 C)  TempSrc:  Axillary  Axillary  SpO2: 90% 98% 97% 93%  Weight:      Height:       SpO2: 93 % O2 Flow Rate (L/min): 2 L/min   Intake/Output Summary (Last 24 hours) at 08/21/2023 0941 Last data filed at  08/21/2023 0659 Gross per 24 hour  Intake 1240 ml  Output 1000 ml  Net 240 ml   Filed Weights   08/16/23 1505  Weight: 95.3 kg    Exam: General exam: In no acute distress. Respiratory system: Good air movement and clear to auscultation. Cardiovascular system: S1 & S2 heard, RRR. No JVD. Gastrointestinal system: Abdomen is nondistended, soft and nontender.  Extremities: No pedal edema. Skin: No rashes, lesions or ulcers Psychiatry: Judgement and insight appear normal. Mood & affect appropriate.  Data Reviewed:    Labs: Basic Metabolic Panel: Recent Labs  Lab 08/16/23 1517 08/17/23 0840 08/18/23 0420 08/19/23 0648 08/20/23 0501  NA 137 140 141 140 140  K 3.7 3.6 3.9 3.7 3.8  CL 101 102 106 106 106  CO2 22 16* 20* 22 20*  GLUCOSE 161* 199* 180* 138* 165*  BUN 20 24* 32* 26* 20  CREATININE 1.24 1.26* 1.33* 1.40* 1.12  CALCIUM 10.0 9.9 10.0 9.9 10.0  MG  --   --  2.1  --   --    GFR Estimated Creatinine Clearance: 61.9 mL/min (by C-G formula based on SCr of 1.12 mg/dL). Liver Function Tests: Recent Labs  Lab 08/17/23 0840  AST 23  ALT 16  ALKPHOS 61  BILITOT 2.1*  PROT 6.2*  ALBUMIN 3.3*   No results for input(s): "LIPASE", "AMYLASE" in the last 168 hours. Recent Labs  Lab 08/17/23 0830  AMMONIA 32   Coagulation profile No results for input(s): "INR", "PROTIME" in the last 168 hours. COVID-19 Labs  No results for input(s): "DDIMER", "FERRITIN", "LDH", "CRP" in the last 72 hours.  Lab Results  Component Value Date   SARSCOV2NAA NEGATIVE 08/17/2023    CBC: Recent Labs  Lab 08/16/23 1517 08/17/23 0831 08/18/23 0420 08/19/23 0648  WBC 10.8* 8.9 9.1 8.4  NEUTROABS  --  7.2  --  5.8  HGB 12.8* 12.5* 12.6* 13.5  HCT 40.1 39.9 38.8* 39.8  MCV 82.3 83.0 80.3 81.7  PLT 249 244 279 269   Cardiac Enzymes: No results for input(s): "CKTOTAL", "CKMB", "CKMBINDEX", "TROPONINI" in the last 168 hours. BNP (last 3 results) No results for input(s):  "PROBNP" in the last 8760 hours. CBG: Recent Labs  Lab 08/20/23 0739 08/20/23 1155 08/20/23 1506 08/20/23 2319 08/21/23 0752  GLUCAP 177* 249* 203* 197* 183*   D-Dimer: No results for input(s): "DDIMER" in the last 72 hours. Hgb A1c: No results for input(s): "HGBA1C" in the last 72 hours. Lipid Profile: No results for input(s): "CHOL", "HDL", "LDLCALC", "TRIG", "CHOLHDL", "LDLDIRECT" in the last 72 hours. Thyroid function studies: No results for input(s): "TSH", "T4TOTAL", "T3FREE", "THYROIDAB" in the last 72 hours.  Invalid input(s): "FREET3"  Anemia work up: No results for input(s): "VITAMINB12", "FOLATE", "FERRITIN", "TIBC", "IRON", "RETICCTPCT" in the last 72 hours. Sepsis Labs: Recent Labs  Lab 08/16/23 1517 08/17/23 0831 08/18/23 0420 08/18/23 1556 08/19/23 4098  PROCALCITON  --   --   --  0.21  --   WBC 10.8* 8.9 9.1  --  8.4   Microbiology Recent Results (from the past 240 hour(s))  Resp panel by RT-PCR (RSV, Flu A&B, Covid) Anterior Nasal Swab     Status: None   Collection Time: 08/17/23  2:13 AM   Specimen: Anterior Nasal Swab  Result Value Ref Range Status   SARS Coronavirus 2 by RT PCR NEGATIVE NEGATIVE Final   Influenza A by PCR NEGATIVE NEGATIVE Final   Influenza B by PCR NEGATIVE NEGATIVE Final    Comment: (NOTE) The Xpert Xpress SARS-CoV-2/FLU/RSV plus assay is intended as an aid in the diagnosis of influenza from Nasopharyngeal swab specimens and should not be used as a sole basis for treatment. Nasal washings and aspirates are unacceptable for Xpert Xpress SARS-CoV-2/FLU/RSV testing.  Fact Sheet for Patients: BloggerCourse.com  Fact Sheet for Healthcare Providers: SeriousBroker.it  This test is not yet approved or cleared by the [place] FDA and has been authorized for detection and/or diagnosis of SARS-CoV-2 by FDA under an Emergency Use Authorization (EUA). This EUA will remain in  effect (meaning this test can be used) for the duration of the COVID-19 declaration under Section 564(b)(1) of the Act, 21 U.S.C. section 360bbb-3(b)(1), unless the authorization is terminated or revoked.     Resp Syncytial Virus by PCR NEGATIVE NEGATIVE Final    Comment: (NOTE) Fact Sheet for Patients: BloggerCourse.com  Fact Sheet for Healthcare Providers: SeriousBroker.it  This test is not yet approved or cleared by the [place] FDA and has been authorized for detection and/or diagnosis of SARS-CoV-2 by FDA under an Emergency Use Authorization (EUA). This EUA will remain in effect (meaning this test can be used) for the duration of the COVID-19 declaration under Section 564(b)(1) of the Act, 21 U.S.C. section 360bbb-3(b)(1), unless the authorization is terminated or revoked.  Performed at [facility], [address], [place], [place] [address]   Surgical PCR screen     Status: None   Collection Time: 08/18/23 12:18 PM   Specimen: Nasal Mucosa; Nasal Swab  Result Value Ref Range Status   MRSA, PCR NEGATIVE NEGATIVE Final   Staphylococcus aureus NEGATIVE NEGATIVE Final    Comment: (NOTE) The Xpert SA Assay (FDA approved for NASAL specimens in patients 71 years of age and older), is one component of a comprehensive surveillance program. It is not intended to diagnose infection nor to guide or monitor treatment. Performed at [facility], [address], [place], [place] [address]   Culture, blood (Routine X 2) w Reflex to ID Panel     Status: None (Preliminary result)   Collection Time: 08/18/23  3:56 PM   Specimen: BLOOD RIGHT ARM  Result Value Ref Range Status   Specimen Description BLOOD RIGHT ARM  Final   Special Requests   Final    BOTTLES DRAWN AEROBIC AND ANAEROBIC Blood Culture adequate volume   Culture   Final    NO GROWTH 3 DAYS Performed at [facility], [address]  [place], [place] [address]    Report Status PENDING  Incomplete  Culture, blood (Routine X 2) w Reflex to ID Panel     Status: None (Preliminary result)   Collection Time: 08/18/23  3:58 PM   Specimen: BLOOD RIGHT ARM  Result Value Ref Range Status   Specimen Description BLOOD RIGHT ARM  Final   Special Requests   Final    BOTTLES DRAWN AEROBIC AND ANAEROBIC Blood Culture adequate  volume   Culture   Final    NO GROWTH 3 DAYS Performed at [facility], [address] [place], [place] [address]    Report Status PENDING  Incomplete     Medications:    albuterol  2.5 mg Nebulization Q6H   amLODipine  10 mg Oral Daily   aspirin EC  81 mg Oral Daily   atorvastatin  40 mg Oral Daily   benzonatate  100 mg Oral TID   carbidopa-levodopa  1.5 tablet Oral TID   chlorthalidone  25 mg Oral Daily   ciprofloxacin  1 drop Both Eyes Q4H while awake   empagliflozin  10 mg Oral Daily   finasteride  5 mg Oral Daily   guaiFENesin  600 mg Oral BID   insulin aspart  0-5 Units Subcutaneous QHS   insulin aspart  0-9 Units Subcutaneous TID WC   losartan  100 mg Oral Daily   melatonin  3 mg Oral QHS   pantoprazole  80 mg Oral Daily   polyethylene glycol  17 g Oral BID   potassium chloride  20 mEq Oral BID   Continuous Infusions:  ampicillin-sulbactam (UNASYN) IV 200 mL/hr at 08/21/23 0659      LOS: 3 days   Marinda Elk  [facility]  08/21/2023, 9:41 AM

## 2023-08-21 NOTE — Plan of Care (Signed)
Problem: Education: Goal: Ability to describe self-care measures that may prevent or decrease complications (Diabetes Survival Skills Education) will improve Outcome: Progressing Goal: Individualized Educational Video(s) Outcome: Progressing   Problem: Coping: Goal: Ability to adjust to condition or change in health will improve Outcome: Progressing   Problem: Fluid Volume: Goal: Ability to maintain a balanced intake and output will improve Outcome: Progressing   Problem: Health Behavior/Discharge Planning: Goal: Ability to identify and utilize available resources and services will improve Outcome: Progressing Goal: Ability to manage health-related needs will improve Outcome: Progressing   Problem: Metabolic: Goal: Ability to maintain appropriate glucose levels will improve Outcome: Progressing   Problem: Nutritional: Goal: Maintenance of adequate nutrition will improve Outcome: Progressing Goal: Progress toward achieving an optimal weight will improve Outcome: Progressing   Problem: Skin Integrity: Goal: Risk for impaired skin integrity will decrease Outcome: Progressing   Problem: Tissue Perfusion: Goal: Adequacy of tissue perfusion will improve Outcome: Progressing   Problem: Education: Goal: Knowledge of General Education information will improve Description: Including pain rating scale, medication(s)/side effects and non-pharmacologic comfort measures Outcome: Progressing   Problem: Health Behavior/Discharge Planning: Goal: Ability to manage health-related needs will improve Outcome: Progressing   Problem: Clinical Measurements: Goal: Ability to maintain clinical measurements within normal limits will improve Outcome: Progressing Goal: Will remain free from infection Outcome: Progressing Goal: Diagnostic test results will improve Outcome: Progressing Goal: Respiratory complications will improve Outcome: Progressing Goal: Cardiovascular complication will  be avoided Outcome: Progressing   Problem: Activity: Goal: Risk for activity intolerance will decrease Outcome: Progressing   Problem: Nutrition: Goal: Adequate nutrition will be maintained Outcome: Progressing   Problem: Coping: Goal: Level of anxiety will decrease Outcome: Progressing   Problem: Elimination: Goal: Will not experience complications related to bowel motility Outcome: Progressing Goal: Will not experience complications related to urinary retention Outcome: Progressing   Problem: Pain Managment: Goal: General experience of comfort will improve Outcome: Progressing   Problem: Safety: Goal: Ability to remain free from injury will improve Outcome: Progressing   Problem: Skin Integrity: Goal: Risk for impaired skin integrity will decrease Outcome: Progressing   Problem: Education: Goal: Knowledge of cardiac device and self-care will improve Outcome: Progressing Goal: Ability to safely manage health related needs after discharge will improve Outcome: Progressing Goal: Individualized Educational Video(s) Outcome: Progressing   Problem: Cardiac: Goal: Ability to achieve and maintain adequate cardiopulmonary perfusion will improve Outcome: Progressing

## 2023-08-21 NOTE — Telephone Encounter (Signed)
FYI. I spoke with [name] Breuer poa, son. He was updating the office that his father is in the hospital with Pneumonia. He is having hallucinations and vivid dreams, He is going to talk to Dr at [facility], regarding this. He will follow up as scheduled. He thanked me for calling.

## 2023-08-21 NOTE — Telephone Encounter (Signed)
Patient is currently in the hospital and son would like to speak with a nurse concerning the patients health and care

## 2023-08-21 NOTE — Progress Notes (Addendum)
   Rounding Note    Patient Name: Karl Lawson. Date of Encounter: 08/21/2023  [facility] Cardiologist: Meriam Sprague, MD   Subjective   Confused, oriented only to self, denies any pain, SOB, coughing this AM  Vital Signs    Vitals:   08/20/23 2116 08/20/23 2347 08/21/23 0101 08/21/23 0353  BP:  (!) 174/58  (!) 171/60  Pulse: (!) 103 69  87  Resp:  (!) 24  (!) 24  Temp:  98.4 F (36.9 C)  98.6 F (37 C)  TempSrc:  Axillary  Axillary  SpO2: 90% 98% 97% 93%  Weight:      Height:        Intake/Output Summary (Last 24 hours) at 08/21/2023 0731 Last data filed at 08/21/2023 0659 Gross per 24 hour  Intake 1240 ml  Output 1700 ml  Net -460 ml      08/16/2023    3:05 PM 07/18/2023   10:31 AM 07/03/2023    3:37 PM  Last 3 Weights  Weight (lbs) 210 lb 215 lb 6.4 oz 215 lb  Weight (kg) 95.255 kg 97.705 kg 97.523 kg      Telemetry    1:1 conduction this AM 80's, intermittent Mobitz one and 2:1, though nothing very slow with rates 60's - Personally Reviewed  ECG    No new EKGs  Physical Exam   GEN: No acute distress.  Elderly. Oriented only to self Cardiac: RRR, no murmurs, rubs, or gallops.  Respiratory: some scattered rhonchi b/l Psych: calm and cooperative this AMt   Assessment & Plan     PMHx of: DM, COPD, HTN, Parkinson's Admitted for AMS, fever Metabolic encephalopathy 2:1 AVBlock (intermittent)   # 2:1 AVB Planning for PPM prior to discharge once fever/?infx clears BP pending Neg (x2) 2 days resulted only Perhaps look to tomorrow Pending final BC and duration of antibiotics   C/w IM service:    # Febrile illness None in 24 hours Possible aspiration/bronchitis  # purulent conjunctivitis Appreciate IM team care. On abx.  # confusion/AMS # Parkinson's   [name], PA-C  EP Attending  Patient seen and examined. Course over the weekend noted. The patient has had some return of his AV conduction. His pneumonia  is slowly improving and no more fevers. PPM insertion when schedule allows.   Sharlot Gowda Oswell Say,MD

## 2023-08-21 NOTE — Progress Notes (Signed)
   08/21/23 2017  BiPAP/CPAP/SIPAP  BiPAP/CPAP/SIPAP Pt Type Adult  Reason BIPAP/CPAP not in use Non-compliant

## 2023-08-21 NOTE — Plan of Care (Signed)
Problem: Clinical Measurements: Goal: Ability to maintain clinical measurements within normal limits will improve Outcome: Progressing Goal: Diagnostic test results will improve Outcome: Progressing Goal: Respiratory complications will improve Outcome: Progressing Goal: Cardiovascular complication will be avoided Outcome: Progressing

## 2023-08-21 NOTE — Telephone Encounter (Signed)
Pt's son called back in. He said he called previously and spoke with the after hours staff. He was wanting to speak with someone about his father. He is currently in the hospital with pneumonia.

## 2023-08-22 ENCOUNTER — Ambulatory Visit: Payer: Self-pay

## 2023-08-22 DIAGNOSIS — R42 Dizziness and giddiness: Secondary | ICD-10-CM | POA: Diagnosis not present

## 2023-08-22 DIAGNOSIS — R410 Disorientation, unspecified: Secondary | ICD-10-CM | POA: Diagnosis not present

## 2023-08-22 DIAGNOSIS — G9341 Metabolic encephalopathy: Secondary | ICD-10-CM | POA: Diagnosis not present

## 2023-08-22 DIAGNOSIS — I10 Essential (primary) hypertension: Secondary | ICD-10-CM

## 2023-08-22 DIAGNOSIS — I441 Atrioventricular block, second degree: Secondary | ICD-10-CM | POA: Diagnosis not present

## 2023-08-22 DIAGNOSIS — J690 Pneumonitis due to inhalation of food and vomit: Secondary | ICD-10-CM

## 2023-08-22 DIAGNOSIS — R059 Cough, unspecified: Secondary | ICD-10-CM | POA: Diagnosis not present

## 2023-08-22 LAB — GLUCOSE, CAPILLARY
Glucose-Capillary: 152 mg/dL — ABNORMAL HIGH (ref 70–99)
Glucose-Capillary: 119 mg/dL — ABNORMAL HIGH (ref 70–99)
Glucose-Capillary: 123 mg/dL — ABNORMAL HIGH (ref 70–99)
Glucose-Capillary: 206 mg/dL — ABNORMAL HIGH (ref 70–99)

## 2023-08-22 MED ORDER — SODIUM CHLORIDE 0.9 % IV SOLN: 250.0000 mL | INTRAVENOUS | Status: DC

## 2023-08-22 MED ORDER — SODIUM CHLORIDE 0.9 % IV SOLN: INTRAVENOUS | Status: DC

## 2023-08-22 MED ORDER — SODIUM CHLORIDE 0.9% FLUSH
3.0000 mL | Freq: Two times a day (BID) | INTRAVENOUS | Status: DC
  Administered 2023-08-22 – 2023-08-24 (×5): 3 mL via INTRAVENOUS

## 2023-08-22 MED ORDER — CEFAZOLIN SODIUM-DEXTROSE 2-4 GM/100ML-% IV SOLN: 2.0000 g | INTRAVENOUS | Status: DC

## 2023-08-22 MED ORDER — CHLORHEXIDINE GLUCONATE 4 % EX SOLN
60.0000 mL | Freq: Once | CUTANEOUS | Status: DC
  Filled 2023-08-22: qty 60

## 2023-08-22 MED ORDER — SODIUM CHLORIDE 0.9% FLUSH: 3.0000 mL | INTRAVENOUS | Status: DC | PRN

## 2023-08-22 MED ORDER — CHLORHEXIDINE GLUCONATE 4 % EX SOLN
60.0000 mL | Freq: Once | CUTANEOUS | Status: AC
  Administered 2023-08-22: 4 via TOPICAL
  Filled 2023-08-22: qty 60

## 2023-08-22 MED ORDER — SODIUM CHLORIDE 0.9 % IV SOLN
1.5000 g | Freq: Four times a day (QID) | INTRAVENOUS | Status: AC
  Administered 2023-08-22: 1.5 g via INTRAVENOUS
  Filled 2023-08-22: qty 4

## 2023-08-22 MED ORDER — SODIUM CHLORIDE 0.9 % IV SOLN
80.0000 mg | INTRAVENOUS | Status: AC
  Filled 2023-08-22: qty 2

## 2023-08-22 NOTE — Care Management Important Message (Signed)
Important Message  Patient Details  Name: Karl Lawson. MRN: [id number] Date of Birth: 1946-01-16   Important Message Given:  Yes - Medicare IM     Sherilyn Banker 08/22/2023, 10:28 AM

## 2023-08-22 NOTE — Plan of Care (Signed)
Problem: Education: Goal: Ability to describe self-care measures that may prevent or decrease complications (Diabetes Survival Skills Education) will improve Outcome: Progressing Goal: Individualized Educational Video(s) Outcome: Progressing   Problem: Coping: Goal: Ability to adjust to condition or change in health will improve Outcome: Progressing   Problem: Fluid Volume: Goal: Ability to maintain a balanced intake and output will improve Outcome: Progressing   Problem: Health Behavior/Discharge Planning: Goal: Ability to identify and utilize available resources and services will improve Outcome: Progressing Goal: Ability to manage health-related needs will improve Outcome: Progressing   Problem: Metabolic: Goal: Ability to maintain appropriate glucose levels will improve Outcome: Progressing   Problem: Nutritional: Goal: Maintenance of adequate nutrition will improve Outcome: Progressing Goal: Progress toward achieving an optimal weight will improve Outcome: Progressing   Problem: Skin Integrity: Goal: Risk for impaired skin integrity will decrease Outcome: Progressing   Problem: Tissue Perfusion: Goal: Adequacy of tissue perfusion will improve Outcome: Progressing   Problem: Education: Goal: Knowledge of General Education information will improve Description: Including pain rating scale, medication(s)/side effects and non-pharmacologic comfort measures Outcome: Progressing   Problem: Health Behavior/Discharge Planning: Goal: Ability to manage health-related needs will improve Outcome: Progressing   Problem: Clinical Measurements: Goal: Ability to maintain clinical measurements within normal limits will improve Outcome: Progressing Goal: Will remain free from infection Outcome: Progressing Goal: Diagnostic test results will improve Outcome: Progressing Goal: Respiratory complications will improve Outcome: Progressing Goal: Cardiovascular complication will  be avoided Outcome: Progressing   Problem: Activity: Goal: Risk for activity intolerance will decrease Outcome: Progressing   Problem: Nutrition: Goal: Adequate nutrition will be maintained Outcome: Progressing   Problem: Coping: Goal: Level of anxiety will decrease Outcome: Progressing   Problem: Elimination: Goal: Will not experience complications related to bowel motility Outcome: Progressing Goal: Will not experience complications related to urinary retention Outcome: Progressing   Problem: Pain Managment: Goal: General experience of comfort will improve Outcome: Progressing   Problem: Safety: Goal: Ability to remain free from injury will improve Outcome: Progressing   Problem: Skin Integrity: Goal: Risk for impaired skin integrity will decrease Outcome: Progressing   Problem: Education: Goal: Knowledge of cardiac device and self-care will improve Outcome: Progressing Goal: Ability to safely manage health related needs after discharge will improve Outcome: Progressing Goal: Individualized Educational Video(s) Outcome: Progressing   Problem: Cardiac: Goal: Ability to achieve and maintain adequate cardiopulmonary perfusion will improve Outcome: Progressing

## 2023-08-22 NOTE — Plan of Care (Signed)
Problem: Coping: Goal: Ability to adjust to condition or change in health will improve Outcome: Progressing   Problem: Fluid Volume: Goal: Ability to maintain a balanced intake and output will improve Outcome: Progressing   Problem: Nutritional: Goal: Maintenance of adequate nutrition will improve Outcome: Progressing Goal: Progress toward achieving an optimal weight will improve Outcome: Progressing   Problem: Skin Integrity: Goal: Risk for impaired skin integrity will decrease Outcome: Progressing   Problem: Tissue Perfusion: Goal: Adequacy of tissue perfusion will improve Outcome: Progressing

## 2023-08-22 NOTE — Progress Notes (Signed)
PT Cancellation Note  Patient Details Name: Karl Lawson. MRN: [id number] DOB: 1946/07/20   Cancelled Treatment:    Reason Eval/Treat Not Completed: Other (comment) - plan for PPM today, will check back post-op.  Marye Round, PT DPT [facility] Services Secure Chat Preferred  Office [phone]    Truddie Coco 08/22/2023, 3:52 PM

## 2023-08-22 NOTE — Progress Notes (Signed)
[facility] PROGRESS NOTE    Progress Note  Karl Lawson.  PIR:[id number] DOB: Aug 10, 1946 DOA: 08/16/2023 PCP: Mliss Sax, MD     Brief Narrative:   Karl Lawson. is an 77 y.o. male past medical history significant for obesity, diabetes mellitus type 2, essential hypertension COPD, recently seen in the ED for confusion, most of the history was obtained from the son he relates his mental status mostly intact as some examinations at times he relates this has been getting worse since his Sinemet was started.  His wife was sick and he saw her on 08/11/2023 started developing cough after this nonpurulent.  Started on Robitussin for which hallucinations got worse after this.  Then switch to NyQuil and he has not slept over the last 2 nights.  Chest x-ray in the ED was unremarkable, MRI of the brain showed no acute intracranial abnormalities she has some chronic changes ischemic of small vessels.  Started empirically on Rocephin and azithromycin we will hold this of Decadron.  Assessment/Plan:   Acute confusional state/acute metabolic encephalopathy: In the setting of probably medication use and probable infectious etiology.  Needs to avoid Robitussin and DayQuil as an outpatient. Try to potassium greater than 4 magnesium greater than 2. Continue melatonin, acute confusional state has resolved.  Fevers due to acute bronchitis/aspiration: Continue IV Unasyn to complete a 5-day course in house. Continue chest physiotherapy.  Asymptomatic bradycardia with a 2-1 heart block: Progressive by prior EKGs. EP was consulted hopefully pacer soon.  Acute kidney injury: Likely prerenal, resolved with IV fluids. Baseline creatinine around 1.  History of Parkinson disease: Continue Sinemet.  Purulent conjunctivitis:  Continue on ciprofloxacin eyedrop.  Controlled diabetes mellitus type 2: With an A1c of 6.1 blood glucose fairly controlled continue current  regimen.  Essential hypertension: Blood pressure slowly improving continue chlorthalidone losartan and Norvasc. Continue IV hydralazine as needed.  Hyperlipidemia:  Continue statins.  COPD: Continue inhalers.  GERD: Continue PPI.  BPH: Continue Flomax and finasteride.   DVT prophylaxis: lovenox Family Communication:sister-in-law Status is: Observation The patient remains OBS appropriate and will d/c before 2 midnights.    Code Status:     Code Status Orders  (From admission, onward)           Start     Ordered   08/17/23 1115  Full code  Continuous       Question:  By:  Answer:  Consent: discussion documented in EHR   08/17/23 1116           Code Status History     Date Active Date Inactive Code Status Order ID Comments User Context   05/24/2022 0859 05/24/2022 1643 Full Code [id number]  Corky Crafts, MD Inpatient      Advance Directive Documentation    Flowsheet Row Most Recent Value  Type of Advance Directive Living will, Healthcare Power of Attorney  Pre-existing out of facility DNR order (yellow form or pink MOST form) --  "MOST" Form in Place? --         IV Access:   Peripheral IV   Procedures and diagnostic studies:   No results found.   Medical Consultants:   None.   Subjective:    Karl Lawson. had a good night, no complaints.  Objective:    Vitals:   08/22/23 0400 08/22/23 0529 08/22/23 0725 08/22/23 0852  BP:  (!) 148/53 (!) 167/60   Pulse:  (!) 44 (!) 46   Resp:  19 20   Temp:  97.6 F (36.4 C) (!) 97.5 F (36.4 C)   TempSrc:  Oral Oral   SpO2: 94%  98% 99%  Weight:      Height:       SpO2: 99 % O2 Flow Rate (L/min): 2 L/min   Intake/Output Summary (Last 24 hours) at 08/22/2023 1033 Last data filed at 08/22/2023 0725 Gross per 24 hour  Intake 300 ml  Output 1450 ml  Net -1150 ml   Filed Weights   08/16/23 1505  Weight: 95.3 kg    Exam: General exam: In no acute  distress. Respiratory system: Good air movement and clear to auscultation. Cardiovascular system: S1 & S2 heard, RRR. No JVD. Gastrointestinal system: Abdomen is nondistended, soft and nontender.  Extremities: No pedal edema. Skin: No rashes, lesions or ulcers Psychiatry: Judgement and insight appear normal. Mood & affect appropriate.  Data Reviewed:    Labs: Basic Metabolic Panel: Recent Labs  Lab 08/16/23 1517 08/17/23 0840 08/18/23 0420 08/19/23 0648 08/20/23 0501  NA 137 140 141 140 140  K 3.7 3.6 3.9 3.7 3.8  CL 101 102 106 106 106  CO2 22 16* 20* 22 20*  GLUCOSE 161* 199* 180* 138* 165*  BUN 20 24* 32* 26* 20  CREATININE 1.24 1.26* 1.33* 1.40* 1.12  CALCIUM 10.0 9.9 10.0 9.9 10.0  MG  --   --  2.1  --   --    GFR Estimated Creatinine Clearance: 61.9 mL/min (by C-G formula based on SCr of 1.12 mg/dL). Liver Function Tests: Recent Labs  Lab 08/17/23 0840  AST 23  ALT 16  ALKPHOS 61  BILITOT 2.1*  PROT 6.2*  ALBUMIN 3.3*   No results for input(s): "LIPASE", "AMYLASE" in the last 168 hours. Recent Labs  Lab 08/17/23 0830  AMMONIA 32   Coagulation profile No results for input(s): "INR", "PROTIME" in the last 168 hours. COVID-19 Labs  No results for input(s): "DDIMER", "FERRITIN", "LDH", "CRP" in the last 72 hours.  Lab Results  Component Value Date   SARSCOV2NAA NEGATIVE 08/17/2023    CBC: Recent Labs  Lab 08/16/23 1517 08/17/23 0831 08/18/23 0420 08/19/23 0648  WBC 10.8* 8.9 9.1 8.4  NEUTROABS  --  7.2  --  5.8  HGB 12.8* 12.5* 12.6* 13.5  HCT 40.1 39.9 38.8* 39.8  MCV 82.3 83.0 80.3 81.7  PLT 249 244 279 269   Cardiac Enzymes: No results for input(s): "CKTOTAL", "CKMB", "CKMBINDEX", "TROPONINI" in the last 168 hours. BNP (last 3 results) No results for input(s): "PROBNP" in the last 8760 hours. CBG: Recent Labs  Lab 08/21/23 0752 08/21/23 1225 08/21/23 1622 08/21/23 2133 08/22/23 0745  GLUCAP 183* 196* 172* 122* 152*    D-Dimer: No results for input(s): "DDIMER" in the last 72 hours. Hgb A1c: No results for input(s): "HGBA1C" in the last 72 hours. Lipid Profile: No results for input(s): "CHOL", "HDL", "LDLCALC", "TRIG", "CHOLHDL", "LDLDIRECT" in the last 72 hours. Thyroid function studies: No results for input(s): "TSH", "T4TOTAL", "T3FREE", "THYROIDAB" in the last 72 hours.  Invalid input(s): "FREET3"  Anemia work up: No results for input(s): "VITAMINB12", "FOLATE", "FERRITIN", "TIBC", "IRON", "RETICCTPCT" in the last 72 hours. Sepsis Labs: Recent Labs  Lab 08/16/23 1517 08/17/23 0831 08/18/23 0420 08/18/23 1556 08/19/23 0648  PROCALCITON  --   --   --  0.21  --   WBC 10.8* 8.9 9.1  --  8.4   Microbiology Recent Results (from the past 240 hour(s))  Resp  panel by RT-PCR (RSV, Flu A&B, Covid) Anterior Nasal Swab     Status: None   Collection Time: 08/17/23  2:13 AM   Specimen: Anterior Nasal Swab  Result Value Ref Range Status   SARS Coronavirus 2 by RT PCR NEGATIVE NEGATIVE Final   Influenza A by PCR NEGATIVE NEGATIVE Final   Influenza B by PCR NEGATIVE NEGATIVE Final    Comment: (NOTE) The Xpert Xpress SARS-CoV-2/FLU/RSV plus assay is intended as an aid in the diagnosis of influenza from Nasopharyngeal swab specimens and should not be used as a sole basis for treatment. Nasal washings and aspirates are unacceptable for Xpert Xpress SARS-CoV-2/FLU/RSV testing.  Fact Sheet for Patients: BloggerCourse.com  Fact Sheet for Healthcare Providers: SeriousBroker.it  This test is not yet approved or cleared by the [place] FDA and has been authorized for detection and/or diagnosis of SARS-CoV-2 by FDA under an Emergency Use Authorization (EUA). This EUA will remain in effect (meaning this test can be used) for the duration of the COVID-19 declaration under Section 564(b)(1) of the Act, 21 U.S.C. section 360bbb-3(b)(1), unless the  authorization is terminated or revoked.     Resp Syncytial Virus by PCR NEGATIVE NEGATIVE Final    Comment: (NOTE) Fact Sheet for Patients: BloggerCourse.com  Fact Sheet for Healthcare Providers: SeriousBroker.it  This test is not yet approved or cleared by the [place] FDA and has been authorized for detection and/or diagnosis of SARS-CoV-2 by FDA under an Emergency Use Authorization (EUA). This EUA will remain in effect (meaning this test can be used) for the duration of the COVID-19 declaration under Section 564(b)(1) of the Act, 21 U.S.C. section 360bbb-3(b)(1), unless the authorization is terminated or revoked.  Performed at [facility], [address] [place], [place] [address]   Surgical PCR screen     Status: None   Collection Time: 08/18/23 12:18 PM   Specimen: Nasal Mucosa; Nasal Swab  Result Value Ref Range Status   MRSA, PCR NEGATIVE NEGATIVE Final   Staphylococcus aureus NEGATIVE NEGATIVE Final    Comment: (NOTE) The Xpert SA Assay (FDA approved for NASAL specimens in patients 72 years of age and older), is one component of a comprehensive surveillance program. It is not intended to diagnose infection nor to guide or monitor treatment. Performed at [facility], [address] [place], [place] [address]   Culture, blood (Routine X 2) w Reflex to ID Panel     Status: None (Preliminary result)   Collection Time: 08/18/23  3:56 PM   Specimen: BLOOD RIGHT ARM  Result Value Ref Range Status   Specimen Description BLOOD RIGHT ARM  Final   Special Requests   Final    BOTTLES DRAWN AEROBIC AND ANAEROBIC Blood Culture adequate volume   Culture   Final    NO GROWTH 4 DAYS Performed at [facility], [address] [place], [place] [address]    Report Status PENDING  Incomplete  Culture, blood (Routine X 2) w Reflex to ID Panel     Status: None (Preliminary result)   Collection Time:  08/18/23  3:58 PM   Specimen: BLOOD RIGHT ARM  Result Value Ref Range Status   Specimen Description BLOOD RIGHT ARM  Final   Special Requests   Final    BOTTLES DRAWN AEROBIC AND ANAEROBIC Blood Culture adequate volume   Culture   Final    NO GROWTH 4 DAYS Performed at [facility], [address] [place],  [place] [address]    Report Status PENDING  Incomplete     Medications:    albuterol  2.5 mg Nebulization Q6H   amLODipine  10 mg Oral Daily   aspirin EC  81 mg Oral Daily   atorvastatin  40 mg Oral Daily   benzonatate  100 mg Oral TID   carbidopa-levodopa  1.5 tablet Oral TID   chlorhexidine  60 mL Topical Once   chlorhexidine  60 mL Topical Once   chlorthalidone  25 mg Oral Daily   ciprofloxacin  1 drop Both Eyes Q4H while awake   empagliflozin  10 mg Oral Daily   finasteride  5 mg Oral Daily   gentamicin (GARAMYCIN) 80 mg in sodium chloride 0.9 % 500 mL irrigation  80 mg Irrigation On Call   guaiFENesin  600 mg Oral BID   hydrALAZINE  10 mg Oral Q8H   insulin aspart  0-5 Units Subcutaneous QHS   insulin aspart  0-9 Units Subcutaneous TID WC   losartan  100 mg Oral Daily   melatonin  5 mg Oral QHS   pantoprazole  80 mg Oral Daily   potassium chloride  20 mEq Oral BID   sodium chloride flush  3 mL Intravenous Q12H   Continuous Infusions:  sodium chloride     sodium chloride     ampicillin-sulbactam (UNASYN) IV 1.5 g (08/22/23 1029)    ceFAZolin (ANCEF) IV        LOS: 4 days   Marinda Elk  [facility]  08/22/2023, 10:33 AM

## 2023-08-22 NOTE — Progress Notes (Signed)
   Rounding Note    Patient Name: Karl Lawson. Date of Encounter: 08/22/2023  [facility] Cardiologist: Karl Sprague, MD (Inactive)   Subjective   AAO x4  this morning, denies CP, palpitations, or SOB.  He independently tells me that he is going to get his PPM today  Vital Signs    Vitals:   08/22/23 0238 08/22/23 0400 08/22/23 0529 08/22/23 0725  BP:   (!) 148/53 (!) 167/60  Pulse:   (!) 44 (!) 46  Resp:   19 20  Temp:   97.6 F (36.4 C) (!) 97.5 F (36.4 C)  TempSrc:   Oral Oral  SpO2: 94% 94%  98%  Weight:      Height:        Intake/Output Summary (Last 24 hours) at 08/22/2023 0804 Last data filed at 08/22/2023 0725 Gross per 24 hour  Intake 300 ml  Output 1450 ml  Net -1150 ml      08/16/2023    3:05 PM 07/18/2023   10:31 AM 07/03/2023    3:37 PM  Last 3 Weights  Weight (lbs) 210 lb 215 lb 6.4 oz 215 lb  Weight (kg) 95.255 kg 97.705 kg 97.523 kg      Telemetry    1:1 conduction with longer periods of intermittent Mobitz one and 2:1, his 2:1 rates are 40's-50's - Personally Reviewed  ECG    No new EKGs  03/24/2022: TTE  1. Pt in Mobitz 1 second degree AV block at time of study.   2. Left ventricular ejection fraction, by estimation, is 70 to 75%. The  left ventricle has hyperdynamic function. The left ventricle has no  regional wall motion abnormalities. Left ventricular diastolic parameters  are indeterminate.   3. Right ventricular systolic function is normal. The right ventricular  size is mildly enlarged.   4. Left atrial size was moderately dilated.   5. The mitral valve is normal in structure. No evidence of mitral valve  regurgitation. No evidence of mitral stenosis.   6. The aortic valve is tricuspid. Aortic valve regurgitation is not  visualized. Aortic valve sclerosis is present, with no evidence of aortic  valve stenosis.   7. The inferior vena cava is dilated in size with >50% respiratory  variability, suggesting  right atrial pressure of 8 mmHg.   Comparison(s): No prior Echocardiogram.   Physical Exam   GEN: No acute distress.  Elderly. Oriented only to self Cardiac: RRR, no murmurs, rubs, or gallops.  Respiratory: some scattered rhonchi b/l Psych: calm and cooperative this AMt   Assessment & Plan     PMHx of: DM, COPD, HTN, Parkinson's Admitted for AMS, fever Metabolic encephalopathy 2:1 AVBlock (intermittent)   # 2:1 AVB Planning for PPM today Afebrile a few days now 5 days of abx  Looks much better this AM Patient is agreeable to proceed today I have called his son Karl Lawson and spoke to him this morning as well, Dr. Ladona Ridgel had discussed with him the procedure, ration, he had no follow up questions and remains agreeable as well    C/w IM service:  # Febrile illness None in >48 hours Possible aspiration/bronchitis 5 day s of antibiotics in  # purulent conjunctivitis Appreciate IM team care. On abx.  # confusion/AMS Much improved today He is AAO x4 this morning Able to tell me he is planned for PPM today  # Parkinson's   Karl Dowse, PA-C

## 2023-08-22 NOTE — Patient Outreach (Signed)
  Care Coordination   Documentation  Visit Note   08/22/2023 Name: Curtiss Mahmood. MRN: [id number] DOB: 08-Sep-1946  [name]. is a 77 y.o. year old male who sees Mliss Sax, MD for primary care. I  reviewed chart. Patient noted to be hospitalized.  Disposition not known at this time.RN CM will continue to follow hospitalization.   What matters to the patients health and wellness today?  N/A    Goals Addressed   None     SDOH assessments and interventions completed:  No     Care Coordination Interventions:  Yes, provided   Follow up plan:  RN CM will follow up as appropriate post hospital discharge    Encounter Outcome:  Patient Visit Completed   Bary Leriche, RN, MSN [facility]  [facility], [facility] Care Management Community Coordinator Direct Dial: [phone]  Fax: [phone] Website: [email or web address]

## 2023-08-23 ENCOUNTER — Encounter (HOSPITAL_COMMUNITY)
Admission: EM | Disposition: A | Discharge status: Skilled Nursing Facility | Payer: Self-pay | Source: Home / Self Care | Attending: Internal Medicine

## 2023-08-23 DIAGNOSIS — I441 Atrioventricular block, second degree: Secondary | ICD-10-CM | POA: Diagnosis not present

## 2023-08-23 LAB — CULTURE, BLOOD (ROUTINE X 2)
Culture: NO GROWTH
Culture: NO GROWTH
Special Requests: ADEQUATE
Special Requests: ADEQUATE

## 2023-08-23 LAB — GLUCOSE, CAPILLARY
Glucose-Capillary: 167 mg/dL — ABNORMAL HIGH (ref 70–99)
Glucose-Capillary: 172 mg/dL — ABNORMAL HIGH (ref 70–99)
Glucose-Capillary: 194 mg/dL — ABNORMAL HIGH (ref 70–99)
Glucose-Capillary: 182 mg/dL — ABNORMAL HIGH (ref 70–99)

## 2023-08-23 SURGERY — PACEMAKER IMPLANT

## 2023-08-23 MED ORDER — CEFAZOLIN SODIUM-DEXTROSE 2-4 GM/100ML-% IV SOLN
2.0000 g | INTRAVENOUS | Status: DC
  Filled 2023-08-23: qty 100

## 2023-08-23 MED ORDER — SODIUM CHLORIDE 0.9 % IV SOLN: INTRAVENOUS | Status: AC

## 2023-08-23 MED ORDER — POLYVINYL ALCOHOL 1.4 % OP SOLN
2.0000 [drp] | OPHTHALMIC | Status: DC | PRN
  Filled 2023-08-23: qty 15

## 2023-08-23 MED ORDER — CEFAZOLIN SODIUM-DEXTROSE 2-4 GM/100ML-% IV SOLN: 2.0000 g | INTRAVENOUS | Status: DC

## 2023-08-23 NOTE — Progress Notes (Signed)
PPM deferred today by Dr. Graciela Husbands Given his hx of orthostatic intolerances, recommended he get some PT, work on getting OOB and plan for PPM once ready for discharge He spoke to attending, pt family   Notified of transient brady spell to about 29-30bpm Telemetry reviewed Associated with sinus pauses, approx 4 seconds Went to bedside Pt was OOB to chair wrapping up lunch, son at bedside. His son saw the monitor when it alarmed to 30bpm, pt was without symptoms Pt reports when he coughs his HR slows, may have cleared his throat perhaps, but he was unaware.  Continue to follow tele closely  [name], PA-C

## 2023-08-23 NOTE — Progress Notes (Signed)
   08/23/23 0013  BiPAP/CPAP/SIPAP  Reason BIPAP/CPAP not in use Non-compliant (Pt said does not wear cpap at home)

## 2023-08-23 NOTE — TOC Initial Note (Signed)
Transition of Care (TOC) - Initial/Assessment Note    Patient Details  Name: Karl Lawson. MRN: [id number] Date of Birth: May 25, 1946  Transition of Care [facility]) CM/SW Contact:    Gala Lewandowsky, RN Phone Number: 08/23/2023, 1:54 PM  Clinical Narrative: Patient presented for 2:1 AV Block. Plan was for PPM today and patient experienced bradycardia. PT/OT has been ordered- MD wants to see if patient is a candidate for the star program. Case Manager will continue to follow for additional needs as the patient progresses.                 Expected Discharge Plan: Home w Home Health Services Barriers to Discharge: Continued Medical Work up  Living arrangements for the past 2 months: Single Family Home    Prior Living Arrangements/Services Living arrangements for the past 2 months: Single Family Home   Patient language and need for interpreter reviewed:: Yes        Need for Family Participation in Patient Care: Yes (Comment) Care giver support system in place?: Yes (comment)   Criminal Activity/Legal Involvement Pertinent to Current Situation/Hospitalization: No - Comment as needed  Activities of Daily Living   ADL Screening (condition at time of admission) Independently performs ADLs?: Yes (appropriate for developmental age) Is the patient deaf or have difficulty hearing?: No Does the patient have difficulty seeing, even when wearing glasses/contacts?: No Does the patient have difficulty concentrating, remembering, or making decisions?: No  Permission Sought/Granted Permission sought to share information with : Case Manager, Family Supports   Emotional Assessment Appearance:: Appears stated age       Alcohol / Substance Use: Not Applicable Psych Involvement: No (comment)  Admission diagnosis:  Dizziness [R42] Acute delirium [R41.0] Cough, unspecified type [R05.9] AV block, 3rd degree (HCC) [I44.2] Patient Active Problem List   Diagnosis Date Noted   AV  block, 3rd degree (HCC) 08/18/2023   Acute delirium 08/17/2023   Acute metabolic encephalopathy 08/17/2023   Cellulitis of left elbow 06/13/2023   Benign prostatic hyperplasia 06/13/2023   Urge incontinence of urine 06/13/2023   Dysthymia 02/07/2023   Sciatica of right side 02/07/2023   Parkinson's disease (HCC) 07/27/2022   Abnormal nuclear stress test    Hyperglycemia due to type 2 diabetes mellitus (HCC) 02/07/2022   Long term (current) use of insulin (HCC) 02/07/2022   Obesity 02/07/2022   Lower respiratory infection 02/07/2022   Reactive airway disease 02/07/2022   Unilateral inguinal hernia without obstruction or gangrene 12/13/2021   Medication side effect 01/25/2021   Balanitis 01/25/2021   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, bilateral (HCC) 07/29/2019   COPD (chronic obstructive pulmonary disease) (HCC) 12/15/2016   Skin tag 12/15/2016   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 [place] [address] Phone: [phone] Fax: [phone]  [facility] [phone] - Closed - [place], [place] - [address] [place] [address] Phone: [phone] Fax: [phone]  Social Determinants of Health (SDOH) Social History: SDOH Screenings   Food Insecurity: No Food Insecurity (08/17/2023)  Housing: Low Risk  (  08/17/2023)  Transportation Needs: No Transportation Needs (08/17/2023)  Utilities: Not At Risk (08/17/2023)  Alcohol Screen: Low Risk  (05/09/2023)  Depression  (PHQ2-9): Low Risk  (07/18/2023)  Financial Resource Strain: Low Risk  (05/09/2023)  Physical Activity: Insufficiently Active (05/09/2023)  Social Connections: Unknown (05/09/2023)  Stress: No Stress Concern Present (05/09/2023)  Tobacco Use: Medium Risk (08/17/2023)   Readmission Risk Interventions     No data to display

## 2023-08-23 NOTE — Progress Notes (Signed)
PT Cancellation Note  Patient Details Name: Karl Lawson. MRN: [id number] DOB: 1946-08-29   Cancelled Treatment:    Reason Eval/Treat Not Completed: Medical issues which prohibited therapy (Pt going for pacemaker placement today. Nurse asked for PT to HOLD. Will evaluate pt tomorrow.)   [name] 08/23/2023, 9:11 AM [name] M,PT Acute Rehab Services [phone]

## 2023-08-23 NOTE — Plan of Care (Signed)
Problem: Coping: Goal: Ability to adjust to condition or change in health will improve Outcome: Progressing   Problem: Fluid Volume: Goal: Ability to maintain a balanced intake and output will improve Outcome: Progressing   Problem: Health Behavior/Discharge Planning: Goal: Ability to identify and utilize available resources and services will improve Outcome: Progressing Goal: Ability to manage health-related needs will improve Outcome: Progressing   Problem: Metabolic: Goal: Ability to maintain appropriate glucose levels will improve Outcome: Progressing   Problem: Nutritional: Goal: Maintenance of adequate nutrition will improve Outcome: Progressing Goal: Progress toward achieving an optimal weight will improve Outcome: Progressing   Problem: Skin Integrity: Goal: Risk for impaired skin integrity will decrease Outcome: Progressing   Problem: Tissue Perfusion: Goal: Adequacy of tissue perfusion will improve Outcome: Progressing   Problem: Education: Goal: Knowledge of General Education information will improve Description: Including pain rating scale, medication(s)/side effects and non-pharmacologic comfort measures Outcome: Progressing   Problem: Health Behavior/Discharge Planning: Goal: Ability to manage health-related needs will improve Outcome: Progressing   Problem: Clinical Measurements: Goal: Ability to maintain clinical measurements within normal limits will improve Outcome: Progressing Goal: Will remain free from infection Outcome: Progressing Goal: Diagnostic test results will improve Outcome: Progressing Goal: Respiratory complications will improve Outcome: Progressing

## 2023-08-23 NOTE — Progress Notes (Signed)
OT Cancellation Note  Patient Details Name: Karl Lawson. MRN: [id number] DOB: 08-Feb-1946   Cancelled Treatment:    Reason Eval/Treat Not Completed: Patient not medically ready (plan for PPM this afternoon, currently HR changes with coughing. OT will follow for eval)   OT did put pacemaker education handout in room.  Evern Bio Zabrina Brotherton 08/23/2023, 9:14 AM  Nyoka Cowden OTR/L Acute Rehabilitation Services Office: [phone]

## 2023-08-23 NOTE — Progress Notes (Addendum)
Rounding Note    Patient Name: Karl Lawson. Date of Encounter: 08/23/2023  [facility] Cardiologist: Karl Sprague, MD (Inactive)   Subjective   AAO x4  this morning, denies CP, palpitations, or SOB.  Remains well oriented today  Vital Signs    Vitals:   08/23/23 0700 08/23/23 0800 08/23/23 0806 08/23/23 0811  BP:    (!) 160/53  Pulse: (!) 43 (!) 43  (!) 52  Resp:   (!) 24 (!) 24  Temp:    97.8 F (36.6 C)  TempSrc:    Oral  SpO2: 91% 90%  92%  Weight:      Height:        Intake/Output Summary (Last 24 hours) at 08/23/2023 0851 Last data filed at 08/23/2023 9811 Gross per 24 hour  Intake 293.26 ml  Output 3300 ml  Net -3006.74 ml      08/16/2023    3:05 PM 07/18/2023   10:31 AM 07/03/2023    3:37 PM  Last 3 Weights  Weight (lbs) 210 lb 215 lb 6.4 oz 215 lb  Weight (kg) 95.255 kg 97.705 kg 97.523 kg      Telemetry    1:1 conduction with longer periods of intermittent Mobitz one and 2:1, his 2:1 rates are more consistently 40's or so the last 24 hours in 2:1 conduction - Personally Reviewed  ECG    No new EKGs  03/24/2022: TTE  1. Pt in Mobitz 1 second degree AV block at time of study.   2. Left ventricular ejection fraction, by estimation, is 70 to 75%. The  left ventricle has hyperdynamic function. The left ventricle has no  regional wall motion abnormalities. Left ventricular diastolic parameters  are indeterminate.   3. Right ventricular systolic function is normal. The right ventricular  size is mildly enlarged.   4. Left atrial size was moderately dilated.   5. The mitral valve is normal in structure. No evidence of mitral valve  regurgitation. No evidence of mitral stenosis.   6. The aortic valve is tricuspid. Aortic valve regurgitation is not  visualized. Aortic valve sclerosis is present, with no evidence of aortic  valve stenosis.   7. The inferior vena cava is dilated in size with >50% respiratory  variability,  suggesting right atrial pressure of 8 mmHg.   Comparison(s): No prior Echocardiogram.   Physical Exam   GEN: No acute distress.  Elderly. Oriented only to self Cardiac: RRR, no murmurs, rubs, or gallops.  Respiratory: some scattered rhonchi b/l Psych: calm and cooperative this AMt   Assessment & Plan     PMHx of: DM, COPD, HTN, Parkinson's Admitted for AMS, fever Metabolic encephalopathy 2:1 AVBlock (intermittent)   # 2:1 AVB Planning for PPM today Afebrile a few days now Completed RBBB, preserved LVEF Looks much better this AM Patient remains agreeable to proceed today I called his son Karl Lawson yesterday and spoke to him he had no follow up questions and was agreeable as well    C/w IM service:  # Febrile illness None in >72 hours Possible aspiration/bronchitis completed antibiotics  # purulent conjunctivitis Appreciate IM team care. On abx.  # confusion/AMS Much improved today He is AAO x4 again this morning Able to tell me he is planned for PPM today  # Parkinson's   Karl Dowse, PA-C  MBZ 1 2AVB with intermittent (but more prolonged periods) 2:1 conduction  RBBB  Syncope abrupt onset/offset  Orthostatic LH  ( prob hypotension)  Parkinsons  Pneumona /Confusion  Debiltiy     Pt has longstanding RBBB and MBZ 1 2AVB;  his syncopal episode is concerning in its brevity and lack of residua for a bradyarrhythmic event, eg complete heart block, although if the wenckebach physiology is AV nodal ( as it ought to be) then intermittent complete heart block would be unusual  still it jsutifies pacing--- RU-PA jsut shows me a strip of sinus arrest assoc with pause 4.4 sec>> this raises the possibiity ( how wide is OCCAMs razor) of deglutition syncope the treatement for which is pacing   The next question is timing.  He Is quite debilitated, not walking and standing with a walker.  His recovery will be complicated by immobilization of his L arm and as he is not  yet "near" ready to go home, I think he is better served by PT prior to pacing and 2/2 upper extremity immobilziation  His recovery will be further complicated by what sounds like siginificant orthostatic hypotension and have recommended 1) reverse trendelenburg 2) orthos VS

## 2023-08-24 ENCOUNTER — Encounter (HOSPITAL_COMMUNITY)
Admission: EM | Disposition: A | Discharge status: Skilled Nursing Facility | Payer: Self-pay | Source: Home / Self Care | Attending: Internal Medicine

## 2023-08-24 ENCOUNTER — Other Ambulatory Visit: Payer: Self-pay

## 2023-08-24 DIAGNOSIS — R001 Bradycardia, unspecified: Secondary | ICD-10-CM | POA: Diagnosis not present

## 2023-08-24 DIAGNOSIS — R410 Disorientation, unspecified: Secondary | ICD-10-CM | POA: Diagnosis not present

## 2023-08-24 DIAGNOSIS — I441 Atrioventricular block, second degree: Secondary | ICD-10-CM | POA: Diagnosis not present

## 2023-08-24 HISTORY — PX: PACEMAKER IMPLANT: EP1218

## 2023-08-24 LAB — COMPREHENSIVE METABOLIC PANEL
ALT: 10 U/L (ref 0–44)
AST: 24 U/L (ref 15–41)
Albumin: 2.8 g/dL — ABNORMAL LOW (ref 3.5–5.0)
Alkaline Phosphatase: 72 U/L (ref 38–126)
Anion gap: 16 — ABNORMAL HIGH (ref 5–15)
BUN: 25 mg/dL — ABNORMAL HIGH (ref 8–23)
CO2: 22 mmol/L (ref 22–32)
Calcium: 10.8 mg/dL — ABNORMAL HIGH (ref 8.9–10.3)
Chloride: 102 mmol/L (ref 98–111)
Creatinine, Ser: 1.13 mg/dL (ref 0.61–1.24)
GFR, Estimated: >60 mL/min (ref >60)
Glucose, Bld: 232 mg/dL — ABNORMAL HIGH (ref 70–99)
Potassium: 4.1 mmol/L (ref 3.5–5.1)
Sodium: 140 mmol/L (ref 135–145)
Total Bilirubin: 1.1 mg/dL (ref 0.3–1.2)
Total Protein: 5.8 g/dL — ABNORMAL LOW (ref 6.5–8.1)

## 2023-08-24 LAB — MAGNESIUM: Magnesium: 1.9 mg/dL (ref 1.7–2.4)

## 2023-08-24 LAB — CBC WITH DIFFERENTIAL/PLATELET
Abs Immature Granulocytes: 0.34 10*3/uL — ABNORMAL HIGH (ref 0.00–0.07)
Basophils Absolute: 0.1 10*3/uL (ref 0.0–0.1)
Basophils Relative: 1 %
Eosinophils Absolute: 0.3 10*3/uL (ref 0.0–0.5)
Eosinophils Relative: 3 %
HCT: 40.3 % (ref 39.0–52.0)
Hemoglobin: 13.2 g/dL (ref 13.0–17.0)
Immature Granulocytes: 3 %
Lymphocytes Relative: 8 %
Lymphs Abs: 1 10*3/uL (ref 0.7–4.0)
MCH: 26.1 pg (ref 26.0–34.0)
MCHC: 32.8 g/dL (ref 30.0–36.0)
MCV: 79.8 fL — ABNORMAL LOW (ref 80.0–100.0)
Monocytes Absolute: 1.4 10*3/uL — ABNORMAL HIGH (ref 0.1–1.0)
Monocytes Relative: 11 %
Neutro Abs: 9.5 10*3/uL — ABNORMAL HIGH (ref 1.7–7.7)
Neutrophils Relative %: 74 %
Platelets: 223 10*3/uL (ref 150–400)
RBC: 5.05 MIL/uL (ref 4.22–5.81)
RDW: 15.4 % (ref 11.5–15.5)
WBC: 12.6 10*3/uL — ABNORMAL HIGH (ref 4.0–10.5)
nRBC: 0 % (ref 0.0–0.2)

## 2023-08-24 LAB — GLUCOSE, CAPILLARY
Glucose-Capillary: 210 mg/dL — ABNORMAL HIGH (ref 70–99)
Glucose-Capillary: 183 mg/dL — ABNORMAL HIGH (ref 70–99)
Glucose-Capillary: 141 mg/dL — ABNORMAL HIGH (ref 70–99)
Glucose-Capillary: 152 mg/dL — ABNORMAL HIGH (ref 70–99)

## 2023-08-24 LAB — MRSA NEXT GEN BY PCR, NASAL: MRSA by PCR Next Gen: NOT DETECTED

## 2023-08-24 LAB — PHOSPHORUS: Phosphorus: 2.6 mg/dL (ref 2.5–4.6)

## 2023-08-24 SURGERY — PACEMAKER IMPLANT
Anesthesia: LOCAL

## 2023-08-24 MED ORDER — CEFAZOLIN SODIUM-DEXTROSE 2-4 GM/100ML-% IV SOLN
INTRAVENOUS | Status: AC
  Filled 2023-08-24: qty 100

## 2023-08-24 MED ORDER — LIDOCAINE HCL (PF) 1 % IJ SOLN
INTRAMUSCULAR | Status: DC | PRN
  Administered 2023-08-24: 50 mL

## 2023-08-24 MED ORDER — SODIUM CHLORIDE 0.9 % IV SOLN
80.0000 mg | INTRAVENOUS | Status: DC
  Filled 2023-08-24: qty 2

## 2023-08-24 MED ORDER — ACETAMINOPHEN 325 MG PO TABS
325.0000 mg | ORAL_TABLET | ORAL | Status: DC | PRN
  Administered 2023-08-24: 650 mg via ORAL
  Filled 2023-08-24: qty 2

## 2023-08-24 MED ORDER — LIDOCAINE HCL (PF) 1 % IJ SOLN
INTRAMUSCULAR | Status: AC
  Filled 2023-08-24: qty 60

## 2023-08-24 MED ORDER — CEFAZOLIN SODIUM-DEXTROSE 2-4 GM/100ML-% IV SOLN
2.0000 g | INTRAVENOUS | Status: AC
  Administered 2023-08-24: 2 g via INTRAVENOUS
  Filled 2023-08-24: qty 100

## 2023-08-24 MED ORDER — ONDANSETRON HCL 4 MG/2ML IJ SOLN: 4.0000 mg | Freq: Four times a day (QID) | INTRAMUSCULAR | Status: DC | PRN

## 2023-08-24 MED ORDER — ALBUTEROL SULFATE (2.5 MG/3ML) 0.083% IN NEBU: 2.5000 mg | INHALATION_SOLUTION | Freq: Four times a day (QID) | RESPIRATORY_TRACT | Status: DC | PRN

## 2023-08-24 MED ORDER — SODIUM CHLORIDE 0.9 % IV SOLN
INTRAVENOUS | Status: AC
  Administered 2023-08-24: 80 mg
  Filled 2023-08-24: qty 2

## 2023-08-24 MED ORDER — HEPARIN (PORCINE) IN NACL 1000-0.9 UT/500ML-% IV SOLN
INTRAVENOUS | Status: DC | PRN
  Administered 2023-08-24: 500 mL

## 2023-08-24 MED ORDER — CEFAZOLIN SODIUM-DEXTROSE 1-4 GM/50ML-% IV SOLN
1.0000 g | Freq: Four times a day (QID) | INTRAVENOUS | Status: AC
  Administered 2023-08-24 – 2023-08-25 (×3): 1 g via INTRAVENOUS
  Filled 2023-08-24 (×3): qty 50

## 2023-08-24 SURGICAL SUPPLY — 12 items

## 2023-08-24 NOTE — Progress Notes (Signed)
PROGRESS NOTE (late entry, seen 10/16)    Karl Lawson.  MVH:[id number] DOB: [birth year] DOA: 08/16/2023 PCP: Mliss Sax, MD  Chief Complaint  Patient presents with   Dizziness    Brief Narrative:   Karl Lawson. is an 77 y.o. male past medical history significant for obesity, diabetes mellitus type 2, essential hypertension COPD, recently seen in the ED for confusion, most of the history was obtained from the son he relates his mental status mostly intact as some examinations at times he relates this has been getting worse since his Sinemet was started.  His wife was sick and he saw her on 08/11/2023 started developing cough after this nonpurulent.  Started on Robitussin for which hallucinations got worse after this.  Then switch to NyQuil and he has not slept over the last 2 nights.  Chest x-ray in the ED was unremarkable, MRI of the brain showed no acute intracranial abnormalities she has some chronic changes ischemic of small vessels.  Started empirically on Rocephin and azithromycin we will hold this of Decadron.   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  Needs to avoid Robitussin and DayQuil as an outpatient. Resolved Delirium precautions   Fevers due to acute bronchitis/aspiration: CT with findings compatible with bronchitis  Completed course of unasyn  Continue chest physiotherapy.   Asymptomatic bradycardia with Erion Weightman 2-1 heart block: D. Graciela Husbands recommending therapy prior to pacing and upper extremity immobilization    Acute kidney  injury: Likely prerenal, resolved with IV fluids. Baseline creatinine around 1.   History of Parkinson disease: Continue Sinemet.   Purulent conjunctivitis:  Continue on ciprofloxacin eyedrop.   Controlled diabetes mellitus type 2: With an A1c of 6.1 blood glucose fairly controlled continue current regimen.   Essential hypertension: Blood pressure slowly improving continue chlorthalidone, losartan, hydralazine, Norvasc.   Hyperlipidemia:  Continue statins.   COPD: Continue inhalers.   GERD: Continue PPI.   BPH: Continue Flomax and finasteride.    DVT prophylaxis: SCD Code Status: full Family Communication: son at bedside 10/16 Disposition:   Status is: Inpatient Remains inpatient appropriate because: awaiting pacemaker   Consultants:  cardiology  Procedures:  none  Antimicrobials:  Anti-infectives (From admission, onward)    Start     Dose/Rate Route Frequency Ordered Stop   08/24/23 1343  ceFAZolin (ANCEF) 2-4 GM/100ML-% IVPB       Note to Pharmacy: London Sheer T: cabinet override      08/24/23 1343 08/25/23 0159   08/24/23 1343  sodium chloride 0.9 % with gentamicin (GARAMYCIN) ADS Med       Note to Pharmacy: London Sheer T: cabinet override      08/24/23 1343 08/24/23 1505   08/24/23 1245  ceFAZolin (ANCEF) IVPB 2g/100 mL premix        2 g 200 mL/hr over 30 Minutes Intravenous On call 08/24/23 1158 08/24/23 1454   08/24/23 1245  gentamicin (GARAMYCIN) 80 mg in sodium chloride 0.9 % 500 mL irrigation  Status:  Discontinued        80 mg Irrigation On call 08/24/23 1158 08/24/23 1537   08/24/23  0600  ceFAZolin (ANCEF) IVPB 2g/100 mL premix  Status:  Discontinued        2 g 200 mL/hr over 30 Minutes Intravenous On call 08/23/23 1108 08/24/23 1236   08/23/23 1200  ceFAZolin (ANCEF) IVPB 2g/100 mL premix  Status:  Discontinued        2 g 200 mL/hr over 30 Minutes Intravenous On call 08/23/23 0503 08/23/23 1108   08/22/23 1630  gentamicin  (GARAMYCIN) 80 mg in sodium chloride 0.9 % 500 mL irrigation        80 mg Irrigation On call 08/22/23 1031 08/23/23 1630   08/22/23 1630  ceFAZolin (ANCEF) IVPB 2g/100 mL premix  Status:  Discontinued        2 g 200 mL/hr over 30 Minutes Intravenous On call 08/22/23 1031 08/22/23 1102   08/22/23 1630  ceFAZolin (ANCEF) IVPB 2g/100 mL premix  Status:  Discontinued        2 g 200 mL/hr over 30 Minutes Intravenous On call 08/22/23 1151 08/22/23 1208   08/22/23 1600  ampicillin-sulbactam (UNASYN) 1.5 g in sodium chloride 0.9 % 100 mL IVPB        1.5 g 200 mL/hr over 30 Minutes Intravenous Every 6 hours 08/22/23 1043 08/23/23 0817   08/18/23 1630  azithromycin (ZITHROMAX) tablet 500 mg  Status:  Discontinued        500 mg Oral Daily 08/18/23 1544 08/19/23 0948   08/18/23 1600  ampicillin-sulbactam (UNASYN) 1.5 g in sodium chloride 0.9 % 100 mL IVPB  Status:  Discontinued        1.5 g 200 mL/hr over 30 Minutes Intravenous Every 6 hours 08/18/23 1525 08/22/23 1043   08/18/23 1530  cefTRIAXone (ROCEPHIN) 2 g in sodium chloride 0.9 % 100 mL IVPB  Status:  Discontinued        2 g 200 mL/hr over 30 Minutes Intravenous Every 24 hours 08/18/23 1524 08/18/23 1525   08/18/23 1530  azithromycin (ZITHROMAX) 500 mg in sodium chloride 0.9 % 250 mL IVPB  Status:  Discontinued        500 mg 250 mL/hr over 60 Minutes Intravenous Every 24 hours 08/18/23 1524 08/18/23 1544   08/18/23 1245  gentamicin (GARAMYCIN) 80 mg in sodium chloride 0.9 % 500 mL irrigation  Status:  Discontinued        80 mg Irrigation To ShortStay Surgical 08/18/23 1217 08/18/23 1530   08/18/23 1245  ceFAZolin (ANCEF) IVPB 2g/100 mL premix  Status:  Discontinued        2 g 200 mL/hr over 30 Minutes Intravenous To ShortStay Surgical 08/18/23 1217 08/18/23 1530   08/17/23 0645  cefTRIAXone (ROCEPHIN) 1 g in sodium chloride 0.9 % 100 mL IVPB        1 g 200 mL/hr over 30 Minutes Intravenous  Once 08/17/23 0640 08/17/23 0750   08/17/23 0645   azithromycin (ZITHROMAX) tablet 500 mg        500 mg Oral  Once 08/17/23 0640 08/17/23 0651   08/17/23 0000  azithromycin (ZITHROMAX) 250 MG tablet        250 mg Oral Daily 08/17/23 0640         Subjective: No complaints  Objective: Vitals:   08/24/23 1501 08/24/23 1506 08/24/23 1511 08/24/23 1516  BP: (!) 163/55 (!) 165/78 (!) 164/76 (!) 159/74  Pulse: (!) 45 86 87 89  Resp: (!) 22 (!) 31 (!) 22 (!) 24  Temp:      TempSrc:      SpO2:  93% 93% 94% 94%  Weight:      Height:        Intake/Output Summary (Last 24 hours) at 08/24/2023 1549 Last data filed at 08/24/2023 0800 Gross per 24 hour  Intake --  Output 1725 ml  Net -1725 ml   Filed Weights   08/16/23 1505  Weight: 95.3 kg    Examination:  General exam: Appears calm and comfortable - sitting up, eating meal  Respiratory system: unlabored Cardiovascular system: brady Central nervous system: Alert and oriented. No focal neurological deficits. Extremities: no LEE   Data Reviewed: I have personally reviewed following labs and imaging studies  CBC: Recent Labs  Lab 08/18/23 0420 08/19/23 0648 08/24/23 0405  WBC 9.1 8.4 12.6*  NEUTROABS  --  5.8 9.5*  HGB 12.6* 13.5 13.2  HCT 38.8* 39.8 40.3  MCV 80.3 81.7 79.8*  PLT 279 269 223    Basic Metabolic Panel: Recent Labs  Lab 08/18/23 0420 08/19/23 0648 08/20/23 0501 08/24/23 0405  NA 141 140 140 140  K 3.9 3.7 3.8 4.1  CL 106 106 106 102  CO2 20* 22 20* 22  GLUCOSE 180* 138* 165* 232*  BUN 32* 26* 20 25*  CREATININE 1.33* 1.40* 1.12 1.13  CALCIUM 10.0 9.9 10.0 10.8*  MG 2.1  --   --  1.9  PHOS  --   --   --  2.6    GFR: Estimated Creatinine Clearance: 61.3 mL/min (by C-G formula based on SCr of 1.13 mg/dL).  Liver Function Tests: Recent Labs  Lab 08/24/23 0405  AST 24  ALT 10  ALKPHOS 72  BILITOT 1.1  PROT 5.8*  ALBUMIN 2.8*    CBG: Recent Labs  Lab 08/23/23 1112 08/23/23 1604 08/23/23 2102 08/24/23 0807 08/24/23 1213   GLUCAP 172* 194* 182* 210* 183*     Recent Results (from the past 240 hour(s))  Resp panel by RT-PCR (RSV, Flu Kyandre Okray&B, Covid) Anterior Nasal Swab     Status: None   Collection Time: 08/17/23  2:13 AM   Specimen: Anterior Nasal Swab  Result Value Ref Range Status   SARS Coronavirus 2 by RT PCR NEGATIVE NEGATIVE Final   Influenza Chrsitopher Wik by PCR NEGATIVE NEGATIVE Final   Influenza B by PCR NEGATIVE NEGATIVE Final    Comment: (NOTE) The Xpert Xpress SARS-CoV-2/FLU/RSV plus assay is intended as an aid in the diagnosis of influenza from Nasopharyngeal swab specimens and should not be used as Caileen Veracruz sole basis for treatment. Nasal washings and aspirates are unacceptable for Xpert Xpress SARS-CoV-2/FLU/RSV testing.  Fact Sheet for Patients: BloggerCourse.com  Fact Sheet for Healthcare Providers: SeriousBroker.it  This test is not yet approved or cleared by the [place] FDA and has been authorized for detection and/or diagnosis of SARS-CoV-2 by FDA under an Emergency Use Authorization (EUA). This EUA will remain in effect (meaning this test can be used) for the duration of the COVID-19 declaration under Section 564(b)(1) of the Act, 21 U.S.C. section 360bbb-3(b)(1), unless the authorization is terminated or revoked.     Resp Syncytial Virus by PCR NEGATIVE NEGATIVE Final    Comment: (NOTE) Fact Sheet for Patients: BloggerCourse.com  Fact Sheet for Healthcare Providers: SeriousBroker.it  This test is not yet approved or cleared by the [place] FDA and has been authorized for detection and/or diagnosis of SARS-CoV-2 by FDA under an Emergency Use Authorization (EUA). This EUA will remain in effect (meaning this test can be used) for the duration of the COVID-19 declaration under  Section 564(b)(1) of the Act, 21 U.S.C. section 360bbb-3(b)(1), unless the authorization is terminated  or revoked.  Performed at [facility], [address], [place], [place] [address]   Surgical PCR screen     Status: None   Collection Time: 08/18/23 12:18 PM   Specimen: Nasal Mucosa; Nasal Swab  Result Value Ref Range Status   MRSA, PCR NEGATIVE NEGATIVE Final   Staphylococcus aureus NEGATIVE NEGATIVE Final    Comment: (NOTE) The Xpert SA Assay (FDA approved for NASAL specimens in patients 47 years of age and older), is one component of Okema Rollinson comprehensive surveillance program. It is not intended to diagnose infection nor to guide or monitor treatment. Performed at [facility], [address] [place], [place] [address]   Culture, blood (Routine X 2) w Reflex to ID Panel     Status: None   Collection Time: 08/18/23  3:56 PM   Specimen: BLOOD RIGHT ARM  Result Value Ref Range Status   Specimen Description BLOOD RIGHT ARM  Final   Special Requests   Final    BOTTLES DRAWN AEROBIC AND ANAEROBIC Blood Culture adequate volume   Culture   Final    NO GROWTH 5 DAYS Performed at [facility], [address] [place], [place] [address]    Report Status 08/23/2023 FINAL  Final  Culture, blood (Routine X 2) w Reflex to ID Panel     Status: None   Collection Time: 08/18/23  3:58 PM   Specimen: BLOOD RIGHT ARM  Result Value Ref Range Status   Specimen Description BLOOD RIGHT ARM  Final   Special Requests   Final    BOTTLES DRAWN AEROBIC AND ANAEROBIC Blood Culture adequate volume   Culture   Final    NO GROWTH 5 DAYS Performed at [facility], [address], [place], [place] [address]    Report Status 08/23/2023 FINAL  Final  MRSA Next Gen by PCR, Nasal     Status: None   Collection Time: 08/24/23 12:09 PM   Specimen: Nasal Mucosa; Nasal Swab  Result Value Ref Range Status   MRSA by PCR Next Gen NOT DETECTED NOT DETECTED Final    Comment: (NOTE) The GeneXpert MRSA Assay (FDA approved for NASAL specimens only), is one component of Markel Mergenthaler comprehensive MRSA  colonization surveillance program. It is not intended to diagnose MRSA infection nor to guide or monitor treatment for MRSA infections. Test performance is not FDA approved in patients less than 36 years old. Performed at [facility], [address] [place], [place] [address]          Radiology Studies: No results found.      Scheduled Meds:  amLODipine  10 mg Oral Daily   aspirin EC  81 mg Oral Daily   atorvastatin  40 mg Oral Daily   benzonatate  100 mg Oral TID   carbidopa-levodopa  1.5 tablet Oral TID   chlorthalidone  25 mg Oral Daily   empagliflozin  10 mg Oral Daily   finasteride  5 mg Oral Daily   guaiFENesin  600 mg Oral BID   hydrALAZINE  10 mg Oral Q8H   insulin aspart  0-5 Units Subcutaneous QHS   insulin aspart  0-9 Units Subcutaneous TID WC   losartan  100 mg Oral Daily   melatonin  5 mg Oral QHS   pantoprazole  80 mg Oral Daily   potassium chloride  20 mEq Oral BID  Continuous Infusions:  ceFAZolin       LOS: 6 days    Time spent: over 30 min    Lacretia Nicks, MD [facility]   To contact the attending provider between 7A-7P or the covering provider during after hours 7P-7A, please log into the web site [email or web address] and access using universal [place] password for that web site. If you do not have the password, please call the hospital operator.  08/24/2023, 3:49 PM

## 2023-08-24 NOTE — TOC Initial Note (Addendum)
Transition of Care (TOC) - Initial/Assessment Note    Patient Details  Name: Karl Lawson. MRN: [id number] Date of Birth: [birth year]  Transition of Care [facility]) CM/SW Contact:    Delilah Shan, LCSWA Phone Number: 08/24/2023, 1:39 PM  Clinical Narrative:                  CSW received consult for possible SNF placement at time of discharge. Due to patients current orientation CSW spoke with patients son Karl Lawson regarding PT recommendation of SNF placement at time of discharge. Patients son expressed understanding of PT recommendation and is agreeable to SNF placement for patient at time of discharge. Patients son reports preference for [facility] . Patients son gave CSW permission to fax out patient for SNF placement.CSW discussed insurance authorization process and will provide Medicare SNF ratings list with accepted SNF bed offers when available. Patients passr currently pending. CSW submitted requested clinicals to [place] must for review. CSW started insurance authorization for patient. Auth ID# [id number]. Insurance authorization approved from 10/18-10/22.No further questions reported at this time. CSW to continue to follow and assist with discharge planning needs.   CSW provided SNF bed offers to patients son. Patients son accepted SNF bed offer with [facility] for patient. Nicki with [facility] confirmed bed for patient.   Expected Discharge Plan: Skilled Nursing Facility Barriers to Discharge: Continued Medical Work up   Patient Goals and CMS Choice Patient states their goals for this hospitalization and ongoing recovery are:: SNF CMS Medicare.gov Compare Post Acute Care list provided to:: Other (Comment Required) (Son Shubert) Choice offered to / list presented to : Adult Children (son Designer, jewellery)      Expected Discharge Plan and Services In-house Referral: Clinical Social Work     Living arrangements for the past 2 months: Single Family Home                                       Prior Living Arrangements/Services Living arrangements for the past 2 months: Single Family Home Lives with:: Self Patient language and need for interpreter reviewed:: Yes Do you feel safe going back to the place where you live?: No   SNF  Need for Family Participation in Patient Care: Yes (Comment) Care giver support system in place?: Yes (comment)   Criminal Activity/Legal Involvement Pertinent to Current Situation/Hospitalization: No - Comment as needed  Activities of Daily Living   ADL Screening (condition at time of admission) Independently performs ADLs?: Yes (appropriate for developmental age) Is the patient deaf or have difficulty hearing?: No Does the patient have difficulty seeing, even when wearing glasses/contacts?: No Does the patient have difficulty concentrating, remembering, or making decisions?: No  Permission Sought/Granted Permission sought to share information with : Case Manager, Magazine features editor, Family Supports Permission granted to share information with : Yes, Verbal Permission Granted  Share Information with NAME: [name]  Permission granted to share info w AGENCY: SNF  Permission granted to share info w Relationship: Son  Permission granted to share info w Contact Information: Karl Lawson [phone]  Emotional Assessment Appearance:: Appears stated age     Orientation: : Oriented to Self, Oriented to Place Alcohol / Substance Use: Not Applicable Psych Involvement: No (comment)  Admission diagnosis:  Dizziness [R42] Acute delirium [R41.0] Cough, unspecified type [R05.9] AV block, 3rd degree (HCC) [I44.2] Patient Active Problem List   Diagnosis Date Noted   AV block,  3rd degree (HCC) 08/18/2023   Acute delirium 08/17/2023   Acute metabolic encephalopathy 08/17/2023   Cellulitis of left elbow 06/13/2023   Benign prostatic hyperplasia 06/13/2023   Urge incontinence of urine 06/13/2023   Dysthymia 02/07/2023   Sciatica of right side  02/07/2023   Parkinson's disease (HCC) 07/27/2022   Abnormal nuclear stress test    Hyperglycemia due to type 2 diabetes mellitus (HCC) 02/07/2022   Long term (current) use of insulin (HCC) 02/07/2022   Obesity 02/07/2022   Lower respiratory infection 02/07/2022   Reactive airway disease 02/07/2022   Unilateral inguinal hernia without obstruction or gangrene 12/13/2021   Medication side effect 01/25/2021   Balanitis 01/25/2021   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 [place] [address] Phone: [phone] Fax: [phone]  [facility] [phone] - Closed - [place], [place] - [address] [place] [address] Phone: [phone] Fax: [phone]     Social Determinants of Health (SDOH) Social History: SDOH Screenings   Food Insecurity: No Food Insecurity (08/17/2023)  Housing: Low Risk  (08/17/2023)  Transportation Needs: No Transportation Needs (08/17/2023)  Utilities: Not At Risk (08/17/2023)  Alcohol Screen: Low Risk  (05/09/2023)  Depression (PHQ2-9): Low Risk  (07/18/2023)  Financial Resource Strain: Low Risk  (05/09/2023)  Physical Activity: Insufficiently Active (05/09/2023)  Social Connections: Unknown (05/09/2023)  Stress: No Stress Concern Present (05/09/2023)  Tobacco Use: Medium Risk (08/17/2023)   SDOH  Interventions:     Readmission Risk Interventions     No data to display

## 2023-08-24 NOTE — Progress Notes (Addendum)
RE: Karl Lawson. Stoney Bang.  Date of Birth: 11/15/1945  Date: September 20, 1946  To Whom It May Concern:  Please be advised that the above-named patient will require a short-term nursing home stay - anticipated 30 days or less for rehabilitation and strengthening. The plan is for return home.

## 2023-08-24 NOTE — Interval H&P Note (Signed)
History and Physical Interval Note:  08/24/2023 12:48 PM  Karl Lawson.  has presented today for surgery, with the diagnosis of Advance heart block; symptomatic bradycaria.  The various methods of treatment have been discussed with the patient and family. After consideration of risks, benefits and other options for treatment, the patient has consented to  Procedure(s): PACEMAKER IMPLANT (N/A) as a surgical intervention.  The patient's history has been reviewed, patient examined, no change in status, stable for surgery.  I have reviewed the patient's chart and labs.  Questions were answered to the patient's satisfaction.     Karl Lawson

## 2023-08-24 NOTE — H&P (View-Only) (Signed)
Rounding Note    Patient Name: Karl Lawson. Date of Encounter: 08/24/2023  [facility] Cardiologist: Meriam Sprague, MD (Inactive)   Subjective   AAO x4  this morning, (had some overnight confusion), denies CP, palpitations, or SOB.  Remains well oriented today, he asks aboout getting the pacemaker done  Vital Signs    Vitals:   08/23/23 1600 08/23/23 1944 08/24/23 0459 08/24/23 0715  BP:  (!) 167/57 (!) 154/55   Pulse: (!) 48 (!) 46 (!) 45   Resp:  16 16   Temp:  (!) 97.5 F (36.4 C) (!) 97.4 F (36.3 C)   TempSrc:  Oral Oral   SpO2: 93% 99% 95% 96%  Weight:      Height:        Intake/Output Summary (Last 24 hours) at 08/24/2023 0739 Last data filed at 08/24/2023 0000 Gross per 24 hour  Intake 240 ml  Output 2175 ml  Net -1935 ml      08/16/2023    3:05 PM 07/18/2023   10:31 AM 07/03/2023    3:37 PM  Last 3 Weights  Weight (lbs) 210 lb 215 lb 6.4 oz 215 lb  Weight (kg) 95.255 kg 97.705 kg 97.523 kg      Telemetry    1:1 conduction with longer periods of intermittent Mobitz one and 2:1, his 2:1 rates are more consistently 40's or so the last 24 hours in 2:1 conduction  Had a couple episodes yesterday afternoon of sinus pauses while eating, 4- 4.3 seconds without symptoms - Personally Reviewed  ECG    No new EKGs  03/24/2022: TTE  1. Pt in Mobitz 1 second degree AV block at time of study.   2. Left ventricular ejection fraction, by estimation, is 70 to 75%. The  left ventricle has hyperdynamic function. The left ventricle has no  regional wall motion abnormalities. Left ventricular diastolic parameters  are indeterminate.   3. Right ventricular systolic function is normal. The right ventricular  size is mildly enlarged.   4. Left atrial size was moderately dilated.   5. The mitral valve is normal in structure. No evidence of mitral valve  regurgitation. No evidence of mitral stenosis.   6. The aortic valve is tricuspid. Aortic valve  regurgitation is not  visualized. Aortic valve sclerosis is present, with no evidence of aortic  valve stenosis.   7. The inferior vena cava is dilated in size with >50% respiratory  variability, suggesting right atrial pressure of 8 mmHg.   Comparison(s): No prior Echocardiogram.   Physical Exam   GEN: No acute distress.  Elderly. Oriented only to self Cardiac: RRR, no murmurs, rubs, or gallops.  Respiratory: some scattered rhonchi b/l Psych: calm and cooperative this AMt   Assessment & Plan     PMHx of: DM, COPD, HTN, Parkinson's Admitted for AMS, fever Metabolic encephalopathy 2:1 AVBlock (intermittent)   # 2:1 AVB Planning for PPM today Remains Afebrile several days now Completed Abx RBBB, preserved LVEF  Looks much better this AM Planned yesterday for pacing though Dr. Graciela Husbands felt getting him some PT given debilitated state and hxof orthostatic symptoms prior pacing (before he would have LUE restrictions) may be the better course  Will keep him NPO today for Dr. Ladona Ridgel, +/- implant today timing dicision    C/w IM service:  # Febrile illness None in >72 hours Possible aspiration/bronchitis completed antibiotics Mild leukcoytosis 12.6, no symptoms of illness  # purulent conjunctivitis Appreciate IM team  care. On abx.  # confusion/AMS Much improved today He is AAO x4 again this morning Able to tell me he is planned for PPM today  # Parkinson's   [name], PA-C   EP Attending  Patient seen and examined. He is well known to me. He has persistent 2:1 AV block and also had sinus pauses during oral intake. He has improved markedly from last week after treatment of pneumonia as well as purulent conjunctivitis. I have reviewed the indications/risks/benefits/goals/expectations of PPM insertion with the patient and he wishes to proceed.  Sharlot Gowda Katlynne Mckercher,MD

## 2023-08-24 NOTE — Evaluation (Signed)
Physical Therapy Evaluation Patient Details Name: Karl Lawson. MRN: [id number] DOB: [birth year] Today's Date: 08/24/2023  History of Present Illness  77 yo male presents to [facility] on 10/9 with ongoing cough since 10/4, dizziness, AME with hallucinations. Pt found to have 2:1 AV block, pending PPM deferred due to deconditioning and generalized weakness. Additionally, pt with AKI, PMH includes obesity, DM2, HTN, HLD, COPD, GERD, Parkinson disease.   Clinical Impression  Pt in bed upon arrival and agreeable to PT eval. Pt reports prior to hospital admission being independent for ADLs and mobility with no DME, however, pt currently an unreliable historian. While seated EOB, pt would lean posteriorly while performing dynamic activities. Pt was able to stand with ModAx2 on initial stand attempt and progressed to MinAx2 with two hand hold. Pt able to perform stand pivot transfer with MinAx2 and two hand hold. During the session, pt's HR varied from 41-103 BPM with activity. Pt requires increased time for processing and follows single step commands well. Recommendation for STAR program while in the hospital to improve mobility and ADLs. Also recommendation for <3 hrs post acute rehab to work towards independence with mobility. Acute PT to follow.       If plan is discharge home, recommend the following: A lot of help with walking and/or transfers;A lot of help with bathing/dressing/bathroom;Direct supervision/assist for medications management;Assist for transportation;Help with stairs or ramp for entrance   Can travel by private vehicle   No    Equipment Recommendations  (will continue to assess)     Functional Status Assessment Patient has had a recent decline in their functional status and demonstrates the ability to make significant improvements in function in a reasonable and predictable amount of time.     Precautions / Restrictions Precautions Precautions: Fall Precaution Comments: watch  HR and BP Restrictions Weight Bearing Restrictions: No      Mobility  Bed Mobility Overal bed mobility: Needs Assistance Bed Mobility: Supine to Sit     Supine to sit: Mod assist, Used rails, HOB elevated     General bed mobility comments: multimodal cues for each step of bed mobility, assist for trunk elevation    Transfers Overall transfer level: Needs assistance Equipment used: 2 person hand held assist Transfers: Sit to/from Stand, Bed to chair/wheelchair/BSC Sit to Stand: Mod assist, +2 physical assistance, +2 safety/equipment, From elevated surface   Step pivot transfers: Min assist, +2 physical assistance, +2 safety/equipment       General transfer comment: Pt required mod A +2 for initial STS. With elevated surface, progressed to min A +2. Min A +2 for pivotal steps today. very weak and decreased activity tolerance for standing. Pt also has chronic LBP aggravated by standing        Balance Overall balance assessment: Needs assistance Sitting-balance support: Single extremity supported, Feet supported Sitting balance-Leahy Scale: Poor Sitting balance - Comments: LOB posterior x2 Postural control: Posterior lean (posterior lean when performing dynamic activities) Standing balance support: Bilateral upper extremity supported Standing balance-Leahy Scale: Poor Standing balance comment: dependent on external support       Pertinent Vitals/Pain Pain Assessment Pain Assessment: Faces Faces Pain Scale: Hurts little more Pain Location: lower back Pain Descriptors / Indicators: Dull, Sore, Discomfort Pain Intervention(s): Limited activity within patient's tolerance, Monitored during session, Repositioned    Home Living Family/patient expects to be discharged to:: Private residence Living Arrangements: Alone Available Help at Discharge: Available PRN/intermittently;Family (son) Type of Home: House Home Access: Level entry Entrance Stairs-Rails:  Right Entrance  Stairs-Number of Steps: 12   Home Layout: Two level (split level home) Home Equipment: None      Prior Function Prior Level of Function : Independent/Modified Independent;Patient poor historian/Family not available        Mobility Comments: per patient no AD ADLs Comments: Per patient, independent     Extremity/Trunk Assessment   Upper Extremity Assessment Upper Extremity Assessment: Defer to OT evaluation    Lower Extremity Assessment Lower Extremity Assessment: Generalized weakness    Cervical / Trunk Assessment Cervical / Trunk Assessment: Kyphotic  Communication   Communication Communication: Hearing impairment (HOH) Cueing Techniques: Verbal cues;Tactile cues  Cognition Arousal: Alert Behavior During Therapy: Flat affect Overall Cognitive Status: Impaired/Different from baseline Area of Impairment: Memory, Following commands, Safety/judgement, Problem solving    Memory: Decreased short-term memory Following Commands: Follows one step commands with increased time Safety/Judgement: Decreased awareness of safety, Decreased awareness of deficits   Problem Solving: Slow processing, Decreased initiation, Difficulty sequencing, Requires verbal cues, Requires tactile cues General Comments: Pt requires multiple verbal and manual cues for mobility and ADLs. Slow responses to questions. Pt oriented to place, situation, thought it was Nov but said next holiday was Easter        General Comments General comments (skin integrity, edema, etc.): potentially unreliable historian, will need to confirm home set up and rehab plans with family     PT Assessment Patient needs continued PT services  PT Problem List Decreased strength;Decreased activity tolerance;Decreased balance;Decreased mobility;Decreased knowledge of use of DME;Decreased safety awareness       PT Treatment Interventions DME instruction;Gait training;Stair training;Functional mobility training;Therapeutic  activities;Therapeutic exercise;Balance training;Neuromuscular re-education;Patient/family education    PT Goals (Current goals can be found in the Care Plan section)  Acute Rehab PT Goals Patient Stated Goal: to get better PT Goal Formulation: With patient Time For Goal Achievement: 09/07/23 Potential to Achieve Goals: Fair    Frequency Min 1X/week     Co-evaluation PT/OT/SLP Co-Evaluation/Treatment: Yes Reason for Co-Treatment: For patient/therapist safety;Necessary to address cognition/behavior during functional activity;To address functional/ADL transfers PT goals addressed during session: Mobility/safety with mobility;Balance;Strengthening/ROM OT goals addressed during session: ADL's and self-care;Strengthening/ROM       AM-PAC PT "6 Clicks" Mobility  Outcome Measure Help needed turning from your back to your side while in a flat bed without using bedrails?: A Little Help needed moving from lying on your back to sitting on the side of a flat bed without using bedrails?: A Lot Help needed moving to and from a bed to a chair (including a wheelchair)?: A Lot Help needed standing up from a chair using your arms (e.g., wheelchair or bedside chair)?: A Lot Help needed to walk in hospital room?: A Lot Help needed climbing 3-5 steps with a railing? : Total 6 Click Score: 12    End of Session Equipment Utilized During Treatment: Gait belt Activity Tolerance: Patient limited by fatigue Patient left: in chair;with call bell/phone within reach;with chair alarm set Nurse Communication: Mobility status PT Visit Diagnosis: Unsteadiness on feet (R26.81);Muscle weakness (generalized) (M62.81);Difficulty in walking, not elsewhere classified (R26.2)    Time: 7829-5621 PT Time Calculation (min) (ACUTE ONLY): 28 min   Charges:   PT Evaluation $PT Eval Moderate Complexity: 1 Mod   PT General Charges $$ ACUTE PT VISIT: 1 Visit         Hilton Cork, PT, DPT Secure [facility]  Office [phone]   Arturo Morton Brion Aliment 08/24/2023, 12:00 PM

## 2023-08-24 NOTE — Evaluation (Signed)
Occupational Therapy Evaluation Patient Details Name: Karl Lawson. MRN: [id number] DOB: 1946-06-15 Today's Date: 08/24/2023   History of Present Illness 77 yo male presents to [facility] on 10/9 with ongoing cough since 10/4, dizziness, AME with hallucinations. Pt found to have 2:1 AV block, pending PPM deferred due to deconditioning and generalized weakness. Additionally, pt with AKI, PMH includes obesity, DM2, HTN, HLD, COPD, GERD, Parkinson disease.   Clinical Impression   Pt is currently unreliable historian. He is pleasantly confused, oriented to self and bday and knows he is in the hospital for a pacemaker. According to him he was independent in ADL and mobility without DME.- Will need to confirm with family. Today Pt progressed from mod A +2 for stand pivot transfer to min A +2 and is overall mod A for ADL - both upper and LB . He requires extra time for processing and has a posterior lean. At this time recommendation has been made for patient to start STAR program here in the hospital to address deconditioning and generalized weakness prior to PPM. After acute stay OT is recommending post-acute rehab of <3 hours daily to maximize safety and independence in ADL and functional transfers. PPM handout in room. Pt will benefit from printed out HEP and therabands so that he can work on strength in between therapy sessions. Therapy will make an effort to see him separately and get Pt as much therapy as possible in acute setting.  HR varied greatly throughout the session 41-103 with quick changes from various levels of activity      If plan is discharge home, recommend the following: A lot of help with walking and/or transfers;A lot of help with bathing/dressing/bathroom;Assistance with cooking/housework;Direct supervision/assist for medications management;Direct supervision/assist for financial management;Assist for transportation;Help with stairs or ramp for entrance;Supervision due to cognitive  status    Functional Status Assessment  Patient has had a recent decline in their functional status and demonstrates the ability to make significant improvements in function in a reasonable and predictable amount of time.  Equipment Recommendations  BSC/3in1    Recommendations for Other Services PT consult;Speech consult     Precautions / Restrictions Precautions Precautions: Fall Precaution Comments: watch HR and BP Restrictions Weight Bearing Restrictions: No      Mobility Bed Mobility Overal bed mobility: Needs Assistance Bed Mobility: Supine to Sit     Supine to sit: Mod assist, Used rails, HOB elevated     General bed mobility comments: multimodal cues for each step of bed mobility, assist for trunk elevation    Transfers Overall transfer level: Needs assistance Equipment used: 2 person hand held assist Transfers: Sit to/from Stand, Bed to chair/wheelchair/BSC Sit to Stand: Mod assist, +2 physical assistance, +2 safety/equipment, From elevated surface     Step pivot transfers: Min assist, +2 physical assistance, +2 safety/equipment     General transfer comment: initially Pt mod A +2 for STS, then progressed to min A +2, min A +2 for pivotal steps today. very weak and decreased activity tolerance (in addition to chronic back pain) for standing      Balance Overall balance assessment: Needs assistance Sitting-balance support: Single extremity supported, Feet supported Sitting balance-Leahy Scale: Poor (approaching fair) Sitting balance - Comments: LOB posterior x2 Postural control: Posterior lean Standing balance support: Bilateral upper extremity supported Standing balance-Leahy Scale: Poor Standing balance comment: dependent on external support  ADL either performed or assessed with clinical judgement   ADL Overall ADL's : Needs assistance/impaired     Grooming: Wash/dry face;Set up;Sitting Grooming Details  (indicate cue type and reason): decreased activity tolerance for grooming Upper Body Bathing: Moderate assistance Upper Body Bathing Details (indicate cue type and reason): for back Lower Body Bathing: Moderate assistance Lower Body Bathing Details (indicate cue type and reason): below the knee Upper Body Dressing : Minimal assistance;Sitting   Lower Body Dressing: Maximal assistance;Sit to/from stand Lower Body Dressing Details (indicate cue type and reason): Pt is able to perform figure 4 to don/doff socks, needs more assist for items requiring sit<>stand Toilet Transfer: Moderate assistance;+2 for physical assistance;+2 for safety/equipment;Stand-pivot;BSC/3in1 Toilet Transfer Details (indicate cue type and reason): simulated through recliner transfer Toileting- Clothing Manipulation and Hygiene: Maximal assistance Toileting - Clothing Manipulation Details (indicate cue type and reason): male purewick, assist for rear peri care in standing     Functional mobility during ADLs: Moderate assistance;+2 for physical assistance;+2 for safety/equipment;Minimal assistance (SPT, progressed from mod to min A +2) General ADL Comments: deconditioned, decreased cognition     Vision Baseline Vision/History: 1 Wears glasses Ability to See in Adequate Light: 0 Adequate Patient Visual Report: No change from baseline Vision Assessment?: No apparent visual deficits Additional Comments: untested this session - glasses in room     Perception         Praxis         Pertinent Vitals/Pain Pain Assessment Pain Assessment: Faces Faces Pain Scale: Hurts little more Pain Location: lower back Pain Descriptors / Indicators: Dull, Sore, Discomfort Pain Intervention(s): Limited activity within patient's tolerance, Monitored during session, Repositioned     Extremity/Trunk Assessment Upper Extremity Assessment Upper Extremity Assessment: Generalized weakness   Lower Extremity Assessment Lower  Extremity Assessment: Defer to PT evaluation   Cervical / Trunk Assessment Cervical / Trunk Assessment: Kyphotic   Communication Communication Communication: Hearing impairment (HOH) Cueing Techniques: Gestural cues;Verbal cues;Tactile cues;Visual cues   Cognition Arousal: Alert Behavior During Therapy: Flat affect Overall Cognitive Status: Impaired/Different from baseline Area of Impairment: Memory, Following commands, Safety/judgement, Awareness, Problem solving                     Memory: Decreased short-term memory Following Commands: Follows one step commands with increased time Safety/Judgement: Decreased awareness of safety, Decreased awareness of deficits Awareness: Intellectual Problem Solving: Slow processing, Decreased initiation, Difficulty sequencing, Requires verbal cues, Requires tactile cues General Comments: Pt oriented to place, situation, thought it was Nov but said next holiday was Easter, knew name and bday today. slow responses, requires multimodal cues for all aspects of mobility and ADL currently     General Comments  no family present at time of evaluation to confirm home set up or confer with about plans    Exercises     Shoulder Instructions      Home Living Family/patient expects to be discharged to:: Private residence Living Arrangements: Alone Available Help at Discharge: Available PRN/intermittently;Family (son) Type of Home: House Home Access: Level entry Entrance Stairs-Number of Steps: 12 Entrance Stairs-Rails: Right Home Layout: Two level     Bathroom Shower/Tub: Tub/shower unit;Walk-in shower   Bathroom Toilet: Standard     Home Equipment: None          Prior Functioning/Environment Prior Level of Function : Independent/Modified Independent;Patient poor historian/Family not available             Mobility Comments: per patient no AD ADLs Comments: Per  patient, independent        OT Problem List: Decreased  strength;Decreased activity tolerance;Impaired balance (sitting and/or standing);Decreased safety awareness;Decreased knowledge of use of DME or AE;Decreased knowledge of precautions;Cardiopulmonary status limiting activity      OT Treatment/Interventions: Self-care/ADL training;Therapeutic exercise;Energy conservation;DME and/or AE instruction;Manual therapy;Therapeutic activities;Patient/family education;Balance training    OT Goals(Current goals can be found in the care plan section) Acute Rehab OT Goals Patient Stated Goal: get stronger for pacemaker OT Goal Formulation: With patient Time For Goal Achievement: 09/07/23 Potential to Achieve Goals: Good ADL Goals Pt Will Perform Grooming: with supervision;standing Pt Will Perform Upper Body Dressing: with supervision;sitting Pt Will Perform Lower Body Dressing: with supervision;sit to/from stand Pt Will Transfer to Toilet: with supervision;ambulating Pt Will Perform Toileting - Clothing Manipulation and hygiene: with supervision;sit to/from stand Pt/caregiver will Perform Home Exercise Program: Both right and left upper extremity;With theraband;With Supervision;With written HEP provided Additional ADL Goal #1: Pt will verbalize pacemaker precautions for impacted arm in preparation for surgery.  OT Frequency: Min 1X/week    Co-evaluation PT/OT/SLP Co-Evaluation/Treatment: Yes Reason for Co-Treatment: For patient/therapist safety;Necessary to address cognition/behavior during functional activity;To address functional/ADL transfers PT goals addressed during session: Mobility/safety with mobility;Balance;Strengthening/ROM OT goals addressed during session: ADL's and self-care;Strengthening/ROM      AM-PAC OT "6 Clicks" Daily Activity     Outcome Measure Help from another person eating meals?: A Little Help from another person taking care of personal grooming?: A Little Help from another person toileting, which includes using toliet,  bedpan, or urinal?: A Lot Help from another person bathing (including washing, rinsing, drying)?: A Lot Help from another person to put on and taking off regular upper body clothing?: A Little Help from another person to put on and taking off regular lower body clothing?: A Lot 6 Click Score: 15   End of Session Equipment Utilized During Treatment: Gait belt;Oxygen Nurse Communication: Mobility status;Precautions (HR 41-103 with activity, SPO2 WFL)  Activity Tolerance: Patient tolerated treatment well Patient left: in chair;with call bell/phone within reach;with chair alarm set  OT Visit Diagnosis: Unsteadiness on feet (R26.81);Other abnormalities of gait and mobility (R26.89);Repeated falls (R29.6);Muscle weakness (generalized) (M62.81);History of falling (Z91.81);Other symptoms and signs involving cognitive function                Time: 0981-1914 OT Time Calculation (min): 29 min Charges:  OT General Charges $OT Visit: 1 Visit OT Evaluation $OT Eval Moderate Complexity: 1 Mod  Nyoka Cowden OTR/L Acute Rehabilitation Services Office: [phone]  Evern Bio [facility] 08/24/2023, 11:22 AM

## 2023-08-24 NOTE — Progress Notes (Signed)
   08/24/23 2139  BiPAP/CPAP/SIPAP  Reason BIPAP/CPAP not in use  (Pt said does not wear cpap at home)

## 2023-08-24 NOTE — Progress Notes (Addendum)
Rounding Note    Patient Name: Karl Lawson. Date of Encounter: 08/24/2023  [facility] Cardiologist: Meriam Sprague, MD (Inactive)   Subjective   AAO x4  this morning, (had some overnight confusion), denies CP, palpitations, or SOB.  Remains well oriented today, he asks aboout getting the pacemaker done  Vital Signs    Vitals:   08/23/23 1600 08/23/23 1944 08/24/23 0459 08/24/23 0715  BP:  (!) 167/57 (!) 154/55   Pulse: (!) 48 (!) 46 (!) 45   Resp:  16 16   Temp:  (!) 97.5 F (36.4 C) (!) 97.4 F (36.3 C)   TempSrc:  Oral Oral   SpO2: 93% 99% 95% 96%  Weight:      Height:        Intake/Output Summary (Last 24 hours) at 08/24/2023 0739 Last data filed at 08/24/2023 0000 Gross per 24 hour  Intake 240 ml  Output 2175 ml  Net -1935 ml      08/16/2023    3:05 PM 07/18/2023   10:31 AM 07/03/2023    3:37 PM  Last 3 Weights  Weight (lbs) 210 lb 215 lb 6.4 oz 215 lb  Weight (kg) 95.255 kg 97.705 kg 97.523 kg      Telemetry    1:1 conduction with longer periods of intermittent Mobitz one and 2:1, his 2:1 rates are more consistently 40's or so the last 24 hours in 2:1 conduction  Had a couple episodes yesterday afternoon of sinus pauses while eating, 4- 4.3 seconds without symptoms - Personally Reviewed  ECG    No new EKGs  03/24/2022: TTE  1. Pt in Mobitz 1 second degree AV block at time of study.   2. Left ventricular ejection fraction, by estimation, is 70 to 75%. The  left ventricle has hyperdynamic function. The left ventricle has no  regional wall motion abnormalities. Left ventricular diastolic parameters  are indeterminate.   3. Right ventricular systolic function is normal. The right ventricular  size is mildly enlarged.   4. Left atrial size was moderately dilated.   5. The mitral valve is normal in structure. No evidence of mitral valve  regurgitation. No evidence of mitral stenosis.   6. The aortic valve is tricuspid. Aortic valve  regurgitation is not  visualized. Aortic valve sclerosis is present, with no evidence of aortic  valve stenosis.   7. The inferior vena cava is dilated in size with >50% respiratory  variability, suggesting right atrial pressure of 8 mmHg.   Comparison(s): No prior Echocardiogram.   Physical Exam   GEN: No acute distress.  Elderly. Oriented only to self Cardiac: RRR, no murmurs, rubs, or gallops.  Respiratory: some scattered rhonchi b/l Psych: calm and cooperative this AMt   Assessment & Plan     PMHx of: DM, COPD, HTN, Parkinson's Admitted for AMS, fever Metabolic encephalopathy 2:1 AVBlock (intermittent)   # 2:1 AVB Planning for PPM today Remains Afebrile several days now Completed Abx RBBB, preserved LVEF  Looks much better this AM Planned yesterday for pacing though Dr. Graciela Husbands felt getting him some PT given debilitated state and hxof orthostatic symptoms prior pacing (before he would have LUE restrictions) may be the better course  Will keep him NPO today for Dr. Ladona Ridgel, +/- implant today timing dicision    C/w IM service:  # Febrile illness None in >72 hours Possible aspiration/bronchitis completed antibiotics Mild leukcoytosis 12.6, no symptoms of illness  # purulent conjunctivitis Appreciate IM team  care. On abx.  # confusion/AMS Much improved today He is AAO x4 again this morning Able to tell me he is planned for PPM today  # Parkinson's   [name], PA-C   EP Attending  Patient seen and examined. He is well known to me. He has persistent 2:1 AV block and also had sinus pauses during oral intake. He has improved markedly from last week after treatment of pneumonia as well as purulent conjunctivitis. I have reviewed the indications/risks/benefits/goals/expectations of PPM insertion with the patient and he wishes to proceed.  Sharlot Gowda Katlynne Mckercher,MD

## 2023-08-24 NOTE — Progress Notes (Signed)
PROGRESS NOTE (late entry, seen 10/16)    Karl Lawson.  SUO:[id number] DOB: [birth year] DOA: 08/16/2023 PCP: Mliss Sax, MD  Chief Complaint  Patient presents with   Dizziness    Brief Narrative:   Karl Lawson. is an 77 y.o. male past medical history significant for obesity, diabetes mellitus type 2, essential hypertension COPD, recently seen in the ED for confusion, most of the history was obtained from the son he relates his mental status mostly intact as some examinations at times he relates this has been getting worse since his Sinemet was started.  His wife was sick and he saw her on 08/11/2023 started developing cough after this nonpurulent.  Started on Robitussin for which hallucinations got worse after this.  Then switch to NyQuil and he has not slept over the last 2 nights.  Chest x-ray in the ED was unremarkable, MRI of the brain showed no acute intracranial abnormalities she has some chronic changes ischemic of small vessels.  Started empirically on Rocephin and azithromycin we will hold this of Decadron.   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  Needs to avoid Robitussin and DayQuil as an outpatient. Resolved Delirium precautions   Fevers due to acute bronchitis/aspiration: CT with findings compatible with bronchitis  Completed course of unasyn  Continue chest physiotherapy.   Asymptomatic bradycardia with Delante Karapetyan 2-1 heart block: Pacemaker placement today, 1017   Orthostasis Follow - likely related to parkinson's, meds Low threshold to  deescalate BP meds Pending orthostatics  Acute kidney injury: Likely prerenal, resolved with IV fluids. Baseline creatinine around 1.   History of Parkinson disease: Continue Sinemet.   Purulent conjunctivitis:  Continue on ciprofloxacin eyedrop.   Controlled diabetes mellitus type 2: With an A1c of 6.1 blood glucose fairly controlled continue current regimen.   Essential hypertension: Blood pressure slowly improving continue chlorthalidone, losartan, hydralazine, Norvasc.   Hyperlipidemia:  Continue statins.   COPD: Continue inhalers.   GERD: Continue PPI.   BPH: Continue Flomax and finasteride.    DVT prophylaxis: SCD Code Status: full Family Communication: sitster in law at bedside 10/17 Disposition:   Status is: Inpatient Remains inpatient appropriate because: awaiting pacemaker   Consultants:  cardiology  Procedures:  none  Antimicrobials:  Anti-infectives (From admission, onward)    Start     Dose/Rate Route Frequency Ordered Stop   08/24/23 1343  ceFAZolin (ANCEF) 2-4 GM/100ML-% IVPB       Note to Pharmacy: London Sheer T: cabinet override      08/24/23 1343 08/25/23 0159   08/24/23 1343  sodium chloride 0.9 % with gentamicin (GARAMYCIN) ADS Med       Note to Pharmacy: London Sheer T: cabinet override      08/24/23 1343 08/24/23 1505   08/24/23 1245  ceFAZolin (ANCEF) IVPB 2g/100 mL premix        2 g 200 mL/hr over 30 Minutes Intravenous On call 08/24/23 1158 08/24/23 1454   08/24/23 1245  gentamicin (GARAMYCIN) 80 mg in sodium chloride 0.9 % 500 mL irrigation  Status:  Discontinued        80  mg Irrigation On call 08/24/23 1158 08/24/23 1537   08/24/23 0600  ceFAZolin (ANCEF) IVPB 2g/100 mL premix  Status:  Discontinued        2 g 200 mL/hr over 30 Minutes Intravenous On call 08/23/23 1108 08/24/23 1236   08/23/23 1200  ceFAZolin (ANCEF) IVPB 2g/100 mL premix  Status:  Discontinued        2 g 200 mL/hr over 30 Minutes Intravenous On  call 08/23/23 0503 08/23/23 1108   08/22/23 1630  gentamicin (GARAMYCIN) 80 mg in sodium chloride 0.9 % 500 mL irrigation        80 mg Irrigation On call 08/22/23 1031 08/23/23 1630   08/22/23 1630  ceFAZolin (ANCEF) IVPB 2g/100 mL premix  Status:  Discontinued        2 g 200 mL/hr over 30 Minutes Intravenous On call 08/22/23 1031 08/22/23 1102   08/22/23 1630  ceFAZolin (ANCEF) IVPB 2g/100 mL premix  Status:  Discontinued        2 g 200 mL/hr over 30 Minutes Intravenous On call 08/22/23 1151 08/22/23 1208   08/22/23 1600  ampicillin-sulbactam (UNASYN) 1.5 g in sodium chloride 0.9 % 100 mL IVPB        1.5 g 200 mL/hr over 30 Minutes Intravenous Every 6 hours 08/22/23 1043 08/23/23 0817   08/18/23 1630  azithromycin (ZITHROMAX) tablet 500 mg  Status:  Discontinued        500 mg Oral Daily 08/18/23 1544 08/19/23 0948   08/18/23 1600  ampicillin-sulbactam (UNASYN) 1.5 g in sodium chloride 0.9 % 100 mL IVPB  Status:  Discontinued        1.5 g 200 mL/hr over 30 Minutes Intravenous Every 6 hours 08/18/23 1525 08/22/23 1043   08/18/23 1530  cefTRIAXone (ROCEPHIN) 2 g in sodium chloride 0.9 % 100 mL IVPB  Status:  Discontinued        2 g 200 mL/hr over 30 Minutes Intravenous Every 24 hours 08/18/23 1524 08/18/23 1525   08/18/23 1530  azithromycin (ZITHROMAX) 500 mg in sodium chloride 0.9 % 250 mL IVPB  Status:  Discontinued        500 mg 250 mL/hr over 60 Minutes Intravenous Every 24 hours 08/18/23 1524 08/18/23 1544   08/18/23 1245  gentamicin (GARAMYCIN) 80 mg in sodium chloride 0.9 % 500 mL irrigation  Status:  Discontinued        80 mg Irrigation To [facility] 08/18/23 1217 08/18/23 1530   08/18/23 1245  ceFAZolin (ANCEF) IVPB 2g/100 mL premix  Status:  Discontinued        2 g 200 mL/hr over 30 Minutes Intravenous To [facility] 08/18/23 1217 08/18/23 1530   08/17/23 0645  cefTRIAXone (ROCEPHIN) 1 g in sodium chloride 0.9 % 100 mL IVPB        1 g 200 mL/hr over 30 Minutes  Intravenous  Once 08/17/23 0640 08/17/23 0750   08/17/23 0645  azithromycin (ZITHROMAX) tablet 500 mg        500 mg Oral  Once 08/17/23 0640 08/17/23 0651   08/17/23 0000  azithromycin (ZITHROMAX) 250 MG tablet        250 mg Oral Daily 08/17/23 0640         Subjective: No complaints Sister in law at bedside  Objective: Vitals:   08/24/23 1501 08/24/23 1506 08/24/23 1511 08/24/23 1516  BP: (!) 163/55 (!) 165/78 (!) 164/76 (!) 159/74  Pulse: (!) 45 86 87 89  Resp: (!) 22 (!) 31 (!) 22 (!)  24  Temp:      TempSrc:      SpO2: 93% 93% 94% 94%  Weight:      Height:        Intake/Output Summary (Last 24 hours) at 08/24/2023 1557 Last data filed at 08/24/2023 0800 Gross per 24 hour  Intake --  Output 1725 ml  Net -1725 ml   Filed Weights   08/16/23 1505  Weight: 95.3 kg    Examination:  General: No acute distress. Sitting up. Cardiovascular: brady Lungs: unlabored Neurological: Alert . Moves all extremities 4 . Cranial nerves II through XII grossly intact. Extremities: No clubbing or cyanosis. No edema.   Data Reviewed: I have personally reviewed following labs and imaging studies  CBC: Recent Labs  Lab 08/18/23 0420 08/19/23 0648 08/24/23 0405  WBC 9.1 8.4 12.6*  NEUTROABS  --  5.8 9.5*  HGB 12.6* 13.5 13.2  HCT 38.8* 39.8 40.3  MCV 80.3 81.7 79.8*  PLT 279 269 223    Basic Metabolic Panel: Recent Labs  Lab 08/18/23 0420 08/19/23 0648 08/20/23 0501 08/24/23 0405  NA 141 140 140 140  K 3.9 3.7 3.8 4.1  CL 106 106 106 102  CO2 20* 22 20* 22  GLUCOSE 180* 138* 165* 232*  BUN 32* 26* 20 25*  CREATININE 1.33* 1.40* 1.12 1.13  CALCIUM 10.0 9.9 10.0 10.8*  MG 2.1  --   --  1.9  PHOS  --   --   --  2.6    GFR: Estimated Creatinine Clearance: 61.3 mL/min (by C-G formula based on SCr of 1.13 mg/dL).  Liver Function Tests: Recent Labs  Lab 08/24/23 0405  AST 24  ALT 10  ALKPHOS 72  BILITOT 1.1  PROT 5.8*  ALBUMIN 2.8*    CBG: Recent Labs   Lab 08/23/23 1112 08/23/23 1604 08/23/23 2102 08/24/23 0807 08/24/23 1213  GLUCAP 172* 194* 182* 210* 183*     Recent Results (from the past 240 hour(s))  Resp panel by RT-PCR (RSV, Flu Martasia Talamante&B, Covid) Anterior Nasal Swab     Status: None   Collection Time: 08/17/23  2:13 AM   Specimen: Anterior Nasal Swab  Result Value Ref Range Status   SARS Coronavirus 2 by RT PCR NEGATIVE NEGATIVE Final   Influenza Leanora Murin by PCR NEGATIVE NEGATIVE Final   Influenza B by PCR NEGATIVE NEGATIVE Final    Comment: (NOTE) The Xpert Xpress SARS-CoV-2/FLU/RSV plus assay is intended as an aid in the diagnosis of influenza from Nasopharyngeal swab specimens and should not be used as Araeya Lamb sole basis for treatment. Nasal washings and aspirates are unacceptable for Xpert Xpress SARS-CoV-2/FLU/RSV testing.  Fact Sheet for Patients: BloggerCourse.com  Fact Sheet for Healthcare Providers: SeriousBroker.it  This test is not yet approved or cleared by the [place] FDA and has been authorized for detection and/or diagnosis of SARS-CoV-2 by FDA under an Emergency Use Authorization (EUA). This EUA will remain in effect (meaning this test can be used) for the duration of the COVID-19 declaration under Section 564(b)(1) of the Act, 21 U.S.C. section 360bbb-3(b)(1), unless the authorization is terminated or revoked.     Resp Syncytial Virus by PCR NEGATIVE NEGATIVE Final    Comment: (NOTE) Fact Sheet for Patients: BloggerCourse.com  Fact Sheet for Healthcare Providers: SeriousBroker.it  This test is not yet approved or cleared by the [place] FDA and has been authorized for detection and/or diagnosis of SARS-CoV-2 by FDA under an Emergency Use Authorization (EUA). This EUA will remain  in effect (meaning this test can be used) for the duration of the COVID-19 declaration under Section 564(b)(1) of the Act,  21 U.S.C. section 360bbb-3(b)(1), unless the authorization is terminated or revoked.  Performed at [facility], [address] [place], [place] [address]   Surgical PCR screen     Status: None   Collection Time: 08/18/23 12:18 PM   Specimen: Nasal Mucosa; Nasal Swab  Result Value Ref Range Status   MRSA, PCR NEGATIVE NEGATIVE Final   Staphylococcus aureus NEGATIVE NEGATIVE Final    Comment: (NOTE) The Xpert SA Assay (FDA approved for NASAL specimens in patients 12 years of age and older), is one component of Kamaljit Hizer comprehensive surveillance program. It is not intended to diagnose infection nor to guide or monitor treatment. Performed at [facility], [address] [place], [place] [address]   Culture, blood (Routine X 2) w Reflex to ID Panel     Status: None   Collection Time: 08/18/23  3:56 PM   Specimen: BLOOD RIGHT ARM  Result Value Ref Range Status   Specimen Description BLOOD RIGHT ARM  Final   Special Requests   Final    BOTTLES DRAWN AEROBIC AND ANAEROBIC Blood Culture adequate volume   Culture   Final    NO GROWTH 5 DAYS Performed at [facility], [address], [place], [place] [address]    Report Status 08/23/2023 FINAL  Final  Culture, blood (Routine X 2) w Reflex to ID Panel     Status: None   Collection Time: 08/18/23  3:58 PM   Specimen: BLOOD RIGHT ARM  Result Value Ref Range Status   Specimen Description BLOOD RIGHT ARM  Final   Special Requests   Final    BOTTLES DRAWN AEROBIC AND ANAEROBIC Blood Culture adequate volume   Culture   Final    NO GROWTH 5 DAYS Performed at [facility], [address] [place], [place] [address]    Report Status 08/23/2023 FINAL  Final  MRSA Next Gen by PCR, Nasal     Status: None   Collection Time: 08/24/23 12:09 PM   Specimen: Nasal Mucosa; Nasal Swab  Result Value Ref Range Status   MRSA by PCR Next Gen NOT DETECTED NOT DETECTED Final    Comment: (NOTE) The GeneXpert MRSA Assay (FDA approved  for NASAL specimens only), is one component of Ashby Leflore comprehensive MRSA colonization surveillance program. It is not intended to diagnose MRSA infection nor to guide or monitor treatment for MRSA infections. Test performance is not FDA approved in patients less than 65 years old. Performed at [facility], [address] [place], [place] [address]          Radiology Studies: No results found.      Scheduled Meds:  amLODipine  10 mg Oral Daily   aspirin EC  81 mg Oral Daily   atorvastatin  40 mg Oral Daily   benzonatate  100 mg Oral TID   carbidopa-levodopa  1.5 tablet Oral TID   chlorthalidone  25 mg Oral Daily   empagliflozin  10 mg Oral Daily   finasteride  5 mg Oral Daily   guaiFENesin  600 mg Oral BID   hydrALAZINE  10 mg Oral Q8H   insulin aspart  0-5 Units Subcutaneous QHS   insulin aspart  0-9 Units Subcutaneous TID WC   losartan  100 mg Oral Daily   melatonin  5 mg Oral QHS  pantoprazole  80 mg Oral Daily   potassium chloride  20 mEq Oral BID   Continuous Infusions:  ceFAZolin       LOS: 6 days    Time spent: over 30 min    Lacretia Nicks, MD [facility]   To contact the attending provider between 7A-7P or the covering provider during after hours 7P-7A, please log into the web site [email or web address] and access using universal [place] password for that web site. If you do not have the password, please call the hospital operator.  08/24/2023, 3:57 PM

## 2023-08-24 NOTE — Progress Notes (Signed)
Came to room after no telemetry noted. Patient found at edge of bed with all leads, oxygen, gown, and purewick off. Asked patient what he was doing. Stated he was wet. Bed and patient indeed wet; unsure if purewick pulled off or if it came loose on own. Patient A&Ox4 at start of shift. Now oriented to self and time only. Asked when he would go back to the hospital and "Is this some sort of communication project". Remained pleasant, easily reoriented, and followed commands to get cleaned up and settled back in bed.

## 2023-08-24 NOTE — NC FL2 (Signed)
[place] MEDICAID FL2 LEVEL OF CARE FORM     IDENTIFICATION  Patient Name: Karl Lawson. Birthdate: 1946/05/15 Sex: male Admission Date (Current Location): 08/16/2023  [facility] and [place] Number:  Producer, television/film/video and Address:  [facility] [place], [address] [place], [place] [address]      Provider Number: [id number]  Attending Physician Name and Address:  Zigmund Daniel., *  Relative Name and Phone Number:  Arvil Persons) [phone]    Current Level of Care: Hospital Recommended Level of Care: Skilled Nursing Facility Prior Approval Number:    Date Approved/Denied:   PASRR Number: PASRR under review  Discharge Plan: SNF    Current Diagnoses: Patient Active Problem List   Diagnosis Date Noted   AV block, 3rd degree (HCC) 08/18/2023   Acute delirium 08/17/2023   Acute metabolic encephalopathy 08/17/2023   Cellulitis of left elbow 06/13/2023   Benign prostatic hyperplasia 06/13/2023   Urge incontinence of urine 06/13/2023   Dysthymia 02/07/2023   Sciatica of right side 02/07/2023   Parkinson's disease (HCC) 07/27/2022   Abnormal nuclear stress test    Hyperglycemia due to type 2 diabetes mellitus (HCC) 02/07/2022   Long term (current) use of insulin (HCC) 02/07/2022   Obesity 02/07/2022   Lower respiratory infection 02/07/2022   Reactive airway disease 02/07/2022   Unilateral inguinal hernia without obstruction or gangrene 12/13/2021   Medication side effect 01/25/2021   Balanitis 01/25/2021   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, bilateral (HCC) 07/29/2019   COPD (chronic obstructive pulmonary disease) (HCC) 12/15/2016   Skin tag 12/15/2016   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   ABNORMAL ELECTROCARDIOGRAM 10/22/2010   OTHER HEART BLOCK 10/14/2010   GERD 05/22/2009   Mixed hyperlipidemia 07/17/2008   Essential hypertension 07/17/2008   History of  adenomatous polyps of colon - probable attenuated polyposis 07/17/2008   ELEVATED PROSTATE SPECIFIC ANTIGEN 07/06/2007    Orientation RESPIRATION BLADDER Height & Weight     Self, Place  O2 (Nasal Cannula 2 liters) Incontinent Weight: 210 lb (95.3 kg) Height:  5\' 8"  (172.7 cm)  BEHAVIORAL SYMPTOMS/MOOD NEUROLOGICAL BOWEL NUTRITION STATUS      Continent Diet (Please see discharge summary)  AMBULATORY STATUS COMMUNICATION OF NEEDS Skin   Extensive Assist Verbally Other (Comment) (WDL,Wound/Incision LDAs)                       Personal Care Assistance Level of Assistance  Bathing, Feeding, Dressing Bathing Assistance: Maximum assistance Feeding assistance: Limited assistance Dressing Assistance: Maximum assistance     Functional Limitations Info  Sight, Hearing, Speech Sight Info:  (Reading glasses) Hearing Info: Adequate Speech Info: Adequate    SPECIAL CARE FACTORS FREQUENCY  PT (By licensed PT), OT (By licensed OT)     PT Frequency: 5x min weekly OT Frequency: 5x min weekly            Contractures Contractures Info: Not present    Additional Factors Info  Code Status, Allergies, Insulin Sliding Scale, Psychotropic Code Status Info: FULL Allergies Info: NKA Psychotropic Info: melatonin tablet 5 mg daily at bedtime Insulin Sliding Scale Info: insulin aspart (novoLOG) injection 0-5 Units daily at bedtime,insulin aspart (novoLOG) injection 0-9 Units 3 times daily with meals       Current Medications (08/24/2023):  This is the current hospital active medication list Current Facility-Administered Medications  Medication Dose Route Frequency Provider Last Rate Last  Admin   acetaminophen (TYLENOL) tablet 650 mg  650 mg Oral Q6H PRN Lorin Glass, MD   650 mg at 08/22/23 0827   Or   acetaminophen (TYLENOL) suppository 650 mg  650 mg Rectal Q6H PRN Dahal, Melina Schools, MD       albuterol (PROVENTIL) (2.5 MG/3ML) 0.083% nebulizer solution 2.5 mg  2.5 mg Nebulization Q6H  PRN Zigmund Daniel., MD       amLODipine (NORVASC) tablet 10 mg  10 mg Oral Daily Sundil, Subrina, MD   10 mg at 08/24/23 1022   aspirin EC tablet 81 mg  81 mg Oral Daily Dahal, Melina Schools, MD   81 mg at 08/24/23 1022   atorvastatin (LIPITOR) tablet 40 mg  40 mg Oral Daily Sundil, Subrina, MD   40 mg at 08/24/23 1020   benzonatate (TESSALON) capsule 100 mg  100 mg Oral TID Janalyn Shy, Subrina, MD   100 mg at 08/24/23 1022   carbidopa-levodopa (SINEMET IR) 25-100 MG per tablet immediate release 1.5 tablet  1.5 tablet Oral TID Lorin Glass, MD   1.5 tablet at 08/24/23 1019   ceFAZolin (ANCEF) 2-4 GM/100ML-% IVPB            ceFAZolin (ANCEF) IVPB 2g/100 mL premix  2 g Intravenous On Call Marinus Maw, MD       chlorhexidine (HIBICLENS) 4 % liquid 4 Application  60 mL Topical Once Ursuy, Renee Lynn, PA-C       chlorthalidone (HYGROTON) tablet 25 mg  25 mg Oral Daily Sundil, Subrina, MD   25 mg at 08/24/23 1018   empagliflozin (JARDIANCE) tablet 10 mg  10 mg Oral Daily Marinda Elk, MD   10 mg at 08/24/23 1023   finasteride (PROSCAR) tablet 5 mg  5 mg Oral Daily Dahal, Binaya, MD   5 mg at 08/24/23 1021   gentamicin (GARAMYCIN) 80 mg in sodium chloride 0.9 % 500 mL irrigation  80 mg Irrigation On Call Marinus Maw, MD       guaiFENesin [facility]) 12 hr tablet 600 mg  600 mg Oral BID Sundil, Subrina, MD   600 mg at 08/24/23 1021   hydrALAZINE (APRESOLINE) injection 10 mg  10 mg Intravenous Q4H PRN Sundil, Subrina, MD   10 mg at 08/21/23 0434   hydrALAZINE (APRESOLINE) tablet 10 mg  10 mg Oral Q8H Marinda Elk, MD   10 mg at 08/24/23 6295   insulin aspart (novoLOG) injection 0-5 Units  0-5 Units Subcutaneous QHS Lorin Glass, MD   2 Units at 08/22/23 2149   insulin aspart (novoLOG) injection 0-9 Units  0-9 Units Subcutaneous TID WC Lorin Glass, MD   2 Units at 08/24/23 1252   losartan (COZAAR) tablet 100 mg  100 mg Oral Daily Marinda Elk, MD   100 mg at 08/24/23 1020    melatonin tablet 5 mg  5 mg Oral QHS Marinda Elk, MD   5 mg at 08/23/23 2052   pantoprazole (PROTONIX) EC tablet 80 mg  80 mg Oral Daily Dahal, Melina Schools, MD   80 mg at 08/24/23 1019   polyethylene glycol (MIRALAX / GLYCOLAX) packet 17 g  17 g Oral Daily PRN Dahal, Melina Schools, MD   17 g at 08/20/23 1059   polyvinyl alcohol (LIQUIFILM TEARS) 1.4 % ophthalmic solution 2 drop  2 drop Both Eyes PRN Zigmund Daniel., MD       potassium chloride SA (KLOR-CON M) CR tablet 20 mEq  20 mEq Oral BID  Marinda Elk, MD   20 mEq at 08/24/23 1021   sodium chloride 0.9 % with gentamicin (GARAMYCIN) ADS Med              Discharge Medications: Please see discharge summary for a list of discharge medications.  Relevant Imaging Results:  Relevant Lab Results:   Additional Information SSN-[id number]  Delilah Shan, LCSWA

## 2023-08-25 ENCOUNTER — Encounter (HOSPITAL_COMMUNITY): Payer: Self-pay | Admitting: Internal Medicine

## 2023-08-25 ENCOUNTER — Inpatient Hospital Stay (HOSPITAL_COMMUNITY): Payer: Medicare Other

## 2023-08-25 DIAGNOSIS — R410 Disorientation, unspecified: Secondary | ICD-10-CM | POA: Diagnosis not present

## 2023-08-25 LAB — CBC
HCT: 44.1 % (ref 39.0–52.0)
Hemoglobin: 14.3 g/dL (ref 13.0–17.0)
MCH: 26.8 pg (ref 26.0–34.0)
MCHC: 32.4 g/dL (ref 30.0–36.0)
MCV: 82.7 fL (ref 80.0–100.0)
Platelets: 234 10*3/uL (ref 150–400)
RBC: 5.33 MIL/uL (ref 4.22–5.81)
RDW: 15.4 % (ref 11.5–15.5)
WBC: 10.9 10*3/uL — ABNORMAL HIGH (ref 4.0–10.5)
nRBC: 0 % (ref 0.0–0.2)

## 2023-08-25 LAB — BASIC METABOLIC PANEL
Anion gap: 16 — ABNORMAL HIGH (ref 5–15)
BUN: 25 mg/dL — ABNORMAL HIGH (ref 8–23)
CO2: 21 mmol/L — ABNORMAL LOW (ref 22–32)
Calcium: 10.6 mg/dL — ABNORMAL HIGH (ref 8.9–10.3)
Chloride: 98 mmol/L (ref 98–111)
Creatinine, Ser: 1.19 mg/dL (ref 0.61–1.24)
GFR, Estimated: >60 mL/min (ref >60)
Glucose, Bld: 145 mg/dL — ABNORMAL HIGH (ref 70–99)
Potassium: 4.7 mmol/L (ref 3.5–5.1)
Sodium: 135 mmol/L (ref 135–145)

## 2023-08-25 LAB — MAGNESIUM: Magnesium: 1.8 mg/dL (ref 1.7–2.4)

## 2023-08-25 LAB — GLUCOSE, CAPILLARY
Glucose-Capillary: 151 mg/dL — ABNORMAL HIGH (ref 70–99)
Glucose-Capillary: 180 mg/dL — ABNORMAL HIGH (ref 70–99)
Glucose-Capillary: 205 mg/dL — ABNORMAL HIGH (ref 70–99)

## 2023-08-25 LAB — PHOSPHORUS: Phosphorus: 2.9 mg/dL (ref 2.5–4.6)

## 2023-08-25 MED ORDER — HYDRALAZINE HCL 10 MG PO TABS: 10.0000 mg | ORAL_TABLET | Freq: Three times a day (TID) | ORAL | Status: DC

## 2023-08-25 MED ORDER — ATORVASTATIN CALCIUM 40 MG PO TABS: 40.0000 mg | ORAL_TABLET | Freq: Every day | ORAL | Status: AC

## 2023-08-25 MED ORDER — AMLODIPINE BESYLATE 10 MG PO TABS: 10.0000 mg | ORAL_TABLET | Freq: Every day | ORAL | Status: DC

## 2023-08-25 MED ORDER — INSULIN ASPART 100 UNIT/ML IJ SOLN: 0.0000 [IU] | Freq: Three times a day (TID) | INTRAMUSCULAR | Status: AC

## 2023-08-25 NOTE — Progress Notes (Signed)
Physical Therapy Treatment Patient Details Name: Karl Lawson. MRN: [id number] DOB: 1946/06/17 Today's Date: 08/25/2023   History of Present Illness 77 yo male presents to [facility] on 10/9 with ongoing cough since 10/4, dizziness, AME with hallucinations. Pt found to have 2:1 AV block, and AKI 10/17  PPM placement  PMH includes obesity, DM2, HTN, HLD, COPD, GERD, Parkinson disease.    PT Comments  Pt to participate in [facility] program for prehab prior to PPM placement, however PPM placed yesterday. Reviewed PPM precautions with pt and wife. Pt agreeable to get up and try to walk in room, however pt limited in ambulation by onset of L hip pain in standing and weightbearing in addition to not being able to utilize L UE for support on RW. Pt ultimately needs modA for bed mobility and mod-minAx2 for STS and step pivot transfers to chair. Plan for discharge to Karl Lawson this afternoon.     If plan is discharge home, recommend the following: A lot of help with walking and/or transfers;A lot of help with bathing/dressing/bathroom;Direct supervision/assist for medications management;Assist for transportation;Help with stairs or ramp for entrance   Can travel by private vehicle     No  Equipment Recommendations   (will continue to assess)       Precautions / Restrictions Precautions Precautions: Fall;ICD/Pacemaker Precaution Comments: PPM handout reviewed Restrictions Weight Bearing Restrictions: No     Mobility  Bed Mobility Overal bed mobility: Needs Assistance Bed Mobility: Supine to Sit     Supine to sit: Mod assist, Used rails, HOB elevated     General bed mobility comments: multimodal cues for sequencing and reminder not to use L UE for pushing, modA for bringing trunk to upright    Transfers Overall transfer level: Needs assistance Equipment used: Rolling walker (2 wheels) Transfers: Sit to/from Stand, Bed to chair/wheelchair/BSC Sit to Stand: Mod assist, +2 physical assistance,  +2 safety/equipment, From elevated surface   Step pivot transfers: Min assist, +2 physical assistance, +2 safety/equipment       General transfer comment: cues for push off with R UE from elevated bed surface, pt with increased L hip pain with standing and weight bearing. pt sat back down, able to come to standing 2 more times, once with marching in place and last attempt with step pivot transfer to recliner, utilized R UE on RW for support, RW managed for him on L side    Ambulation/Gait               General Gait Details: deferred due to decreased stability and pain in L hip      Balance Overall balance assessment: Needs assistance Sitting-balance support: Single extremity supported, Feet supported Sitting balance-Leahy Scale: Fair     Standing balance support: Single extremity supported Standing balance-Leahy Scale: Poor Standing balance comment: dependent on external support                            Cognition Arousal: Alert Behavior During Therapy: Flat affect Overall Cognitive Status: Impaired/Different from baseline Area of Impairment: Memory, Following commands, Safety/judgement, Problem solving                     Memory: Decreased short-term memory Following Commands: Follows one step commands with increased time, Follows multi-step commands with increased time Safety/Judgement: Decreased awareness of safety, Decreased awareness of deficits Awareness: Intellectual Problem Solving: Slow processing, Decreased initiation, Difficulty sequencing, Requires verbal cues,  Requires tactile cues General Comments: pt requires multimodal cuing for task completion, slowed response to questions           General Comments General comments (skin integrity, edema, etc.): VSS on RA, wife and brother in-law present during session, pt does not recognize his brother in law on entry      Pertinent Vitals/Pain Pain Assessment Pain Assessment: Faces Faces  Pain Scale: Hurts little more Pain Location: lower back and L LE with weightbearing, numbness and burning at coccyx Pain Descriptors / Indicators: Dull, Sore, Discomfort Pain Intervention(s): Limited activity within patient's tolerance, Monitored during session, Repositioned, Other (comment) (educated on pressure injuries and need to shift weight when numbness and burning occurs in sacral area)     PT Goals (current goals can now be found in the care plan section) Acute Rehab PT Goals Patient Stated Goal: to get better PT Goal Formulation: With patient Time For Goal Achievement: 09/07/23 Potential to Achieve Goals: Fair Progress towards PT goals: Progressing toward goals    Frequency    Min 1X/week       AM-PAC PT "6 Clicks" Mobility   Outcome Measure  Help needed turning from your back to your side while in a flat bed without using bedrails?: A Little Help needed moving from lying on your back to sitting on the side of a flat bed without using bedrails?: A Lot Help needed moving to and from a bed to a chair (including a wheelchair)?: A Lot Help needed standing up from a chair using your arms (e.g., wheelchair or bedside chair)?: A Lot Help needed to walk in Lawson room?: A Lot Help needed climbing 3-5 steps with a railing? : Total 6 Click Score: 12    End of Session Equipment Utilized During Treatment: Gait belt Activity Tolerance: Patient limited by fatigue Patient left: in chair;with call bell/phone within reach;with chair alarm set;with family/visitor present Nurse Communication: Mobility status PT Visit Diagnosis: Unsteadiness on feet (R26.81);Muscle weakness (generalized) (M62.81);Difficulty in walking, not elsewhere classified (R26.2)     Time: 1610-9604 PT Time Calculation (min) (ACUTE ONLY): 34 min  Charges:    $Therapeutic Activity: 23-37 mins PT General Charges $$ ACUTE PT VISIT: 1 Visit                     Ab Leaming B. Beverely Risen PT, DPT Acute  Rehabilitation Services Please use secure chat or  Call Office [phone]    Elon Alas Fleet 08/25/2023, 12:01 PM

## 2023-08-25 NOTE — Progress Notes (Addendum)
Rounding Note    Patient Name: Karl Lawson. Date of Encounter: 08/25/2023  [facility] Cardiologist: Meriam Sprague, MD (Inactive)   Subjective   AAO x4  this morning, family at bedside (sister), denies CP, SOB  Vital Signs    Vitals:   08/25/23 0359 08/25/23 0619 08/25/23 0759 08/25/23 0826  BP: (!) 150/72 139/68 (!) 160/92   Pulse: 87  82 92  Resp: 20  18   Temp: 97.7 F (36.5 C)  98.2 F (36.8 C)   TempSrc: Axillary     SpO2: 93%  98% 97%  Weight:      Height:        Intake/Output Summary (Last 24 hours) at 08/25/2023 0928 Last data filed at 08/25/2023 0357 Gross per 24 hour  Intake --  Output 1320 ml  Net -1320 ml      08/16/2023    3:05 PM 07/18/2023   10:31 AM 07/03/2023    3:37 PM  Last 3 Weights  Weight (lbs) 210 lb 215 lb 6.4 oz 215 lb  Weight (kg) 95.255 kg 97.705 kg 97.523 kg      Telemetry    SR/V paced - Personally Reviewed  ECG    SR/V paced   03/24/2022: TTE  1. Pt in Mobitz 1 second degree AV block at time of study.   2. Left ventricular ejection fraction, by estimation, is 70 to 75%. The  left ventricle has hyperdynamic function. The left ventricle has no  regional wall motion abnormalities. Left ventricular diastolic parameters  are indeterminate.   3. Right ventricular systolic function is normal. The right ventricular  size is mildly enlarged.   4. Left atrial size was moderately dilated.   5. The mitral valve is normal in structure. No evidence of mitral valve  regurgitation. No evidence of mitral stenosis.   6. The aortic valve is tricuspid. Aortic valve regurgitation is not  visualized. Aortic valve sclerosis is present, with no evidence of aortic  valve stenosis.   7. The inferior vena cava is dilated in size with >50% respiratory  variability, suggesting right atrial pressure of 8 mmHg.   Comparison(s): No prior Echocardiogram.   Physical Exam   GEN: No acute distress.  Elderly. Oriented only to  self Cardiac: RRR, no murmurs, rubs, or gallops.  Respiratory: some scattered rhonchi b/l Psych: calm and cooperative this AMt   Assessment & Plan     PMHx of: DM, COPD, HTN, Parkinson's Admitted for AMS, fever Metabolic encephalopathy 2:1 AVBlock (intermittent)   # 2:1 AVB Planning for PPM today Remains Afebrile several days now Completed Abx RBBB, preserved LVEF  S/p PPM implant yesterday Device check with stable measurements this morning CXR reviewed with stable lead position, pending official read Site is stable Teaching completed with patient and family at bedside, he [name] be discharging to rehab  EP follow up is in place Post pacer care instructions are in his AVS, please ensure they go with the patient to rehab OK to discharge once CXR is officially read and negative for pneumothorax, discussed with attending MD  Dr. Elberta Fortis has seen the patient this morning EP Shannan Garfinkel song off though remain available, please recall if needed    C/w IM service:  # Febrile illness None in >72 hours Possible aspiration/bronchitis completed antibiotics Down again to 10.9  # purulent conjunctivitis Appreciate IM team care. On abx.  # confusion/AMS Much improved today He is AAO x4 again this morning Able to tell  me he is planned for PPM today  # Parkinson's   [name], PA-C   I have seen and examined this patient with [name].  Agree with above, note added to reflect my findings.  On exam, RRR, no murmurs.  She is now status post pacemaker for 2:1 AV block.  Device functioning appropriately.  Chest x-ray and interrogation without issue.  Plan for discharge today with follow-up in device clinic.  Raman Featherston M. Jaynee Winters MD 08/25/2023 3:17 PM

## 2023-08-25 NOTE — Progress Notes (Signed)
Report called to Wynona Meals, LPN at [facility].

## 2023-08-25 NOTE — Progress Notes (Signed)
CXR was negative for PTX  [name], PA-C

## 2023-08-25 NOTE — Plan of Care (Signed)
[facility] Tonsillectomy/Adenoidectomy, Postoperative PEDS care plan entered in error.

## 2023-08-25 NOTE — TOC Progression Note (Addendum)
Transition of Care (TOC) - Progression Note    Patient Details  Name: Karl Lawson. MRN: [id number] Date of Birth: 10-31-1946  Transition of Care [facility]) CM/SW Contact  Inis Sizer, LCSW Phone Number: 08/25/2023, 8:45 AM  Clinical Narrative:    12:55pm: CSW received call from patient's son who is in agreement to the discharge plan of [facility].  11am: Per Lowella Bandy, patient's son is coming to complete admissions paperwork today at 4pm. Patient can be discharged after 4pm today.  CSW arranged PTAR for pick up at 4pm. Patient will go to room 109. The number to call for report is [phone].  CSW attempted to reach patient's son Karl Lawson without success - a voicemail was left requesting a return call.  8:45am: Patient's PASRR is [phone] [id number].  CSW spoke with Lowella Bandy at [facility] who states the facility can accept patient today.  CSW sent secure chat to MD to determine if patient is ready for discharge today.   Expected Discharge Plan: Skilled Nursing Facility Barriers to Discharge: Continued Medical Work up  Expected Discharge Plan and Services In-house Referral: Clinical Social Work     Living arrangements for the past 2 months: Single Family Home                                       Social Determinants of Health (SDOH) Interventions SDOH Screenings   Food Insecurity: No Food Insecurity (08/17/2023)  Housing: Low Risk  (08/17/2023)  Transportation Needs: No Transportation Needs (08/17/2023)  Utilities: Not At Risk (08/17/2023)  Alcohol Screen: Low Risk  (05/09/2023)  Depression (PHQ2-9): Low Risk  (07/18/2023)  Financial Resource Strain: Low Risk  (05/09/2023)  Physical Activity: Insufficiently Active (05/09/2023)  Social Connections: Unknown (05/09/2023)  Stress: No Stress Concern Present (05/09/2023)  Tobacco Use: Medium Risk (08/17/2023)    Readmission Risk Interventions     No data to display

## 2023-08-25 NOTE — Discharge Summary (Signed)
Physician Discharge Summary  Karl Lawson. ZOX:[id number] DOB: [birth year] DOA: 08/16/2023  PCP: Mliss Sax, MD  Admit date: 08/16/2023 Discharge date: 08/25/2023  Time spent: 40 minutes  Recommendations for Outpatient Follow-up:  Follow outpatient CBC/CMP  Follow with cardiology outpatient after pacemaker placement Follow blood sugars outpatient, discharged with SSI rather than 70/30 insulin given well controlled BG's here on only SSI Follow concern for orthostasis outpatient, consider reduction in antihypertensives as needed, etc Follow calcium (hypercalcemia) outpatient - may need workup Follow potassium outpatient, supplement prn with thiazide diuretic   Discharge Diagnoses:  Principal Problem:   Acute delirium Active Problems:   Essential hypertension   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, bilateral (HCC)   Lower respiratory infection   Parkinson's disease (HCC)   Acute metabolic encephalopathy   AV block, 3rd degree (HCC)   Discharge Condition: stable  Diet recommendation: heart healthy   Filed Weights   08/16/23 1505  Weight: 95.3 kg    History of present illness:   Karl Lawson. is an 77 y.o. male past medical history significant for obesity, diabetes mellitus type 2, essential hypertension COPD, recently seen in the ED for confusion, most of the history was obtained from the son he relates his mental status mostly intact as some examinations at times he relates this has been getting worse since his Sinemet was started. His wife was sick and he saw her on 08/11/2023 started developing cough after this nonpurulent. Started on Robitussin for which hallucinations got worse after this. Then switch to NyQuil and he has not slept over the last 2 nights. Chest x-ray in the ED was unremarkable, MRI of the brain showed no acute intracranial abnormalities she has some chronic changes ischemic of small vessels. He  was started empirically on Rocephin and azithromycin.  His confusion gradually improved.  He was treated with antibiotics for aspiration pneumonia/bronchitis.  He had pacemaker placed by cardiology for bradycardia.    See below for additional details  Hospital Course:  Assessment and Plan:  Acute confusional state/acute metabolic encephalopathy: In the setting of probably medication use and probable infectious etiology.  Needs to avoid Robitussin and DayQuil as an outpatient. Resolved Delirium precautions   Fevers due to acute bronchitis/aspiration: CT with findings compatible with bronchitis  Completed course of unasyn  Continue chest physiotherapy.   Asymptomatic bradycardia with Karl Lawson 2-1 heart block: Pacemaker placement 10/17   Orthostasis Follow - likely related to parkinson's, meds Low threshold to deescalate BP meds Seems improved, doing ok with change in position at this time - follow closely   Acute kidney injury: Likely prerenal, resolved with IV fluids. Baseline creatinine around 1.   History of Parkinson disease: Continue Sinemet.   Purulent conjunctivitis:  Completed therapy   Controlled diabetes mellitus type 2: With an A1c of 6.1  At home on 70/30, I suspect he's having lows at home based on conversation - will discharge on SSI only as his BG's reasonable here    Essential hypertension: continue chlorthalidone, losartan, hydralazine, Norvasc.   Hyperlipidemia:  Continue statins.   COPD: Continue inhalers.   GERD: Continue PPI.   BPH: Continue Flomax and finasteride.  Obesity Body mass index is 31.93 kg/m.    Procedures: CONCLUSIONS:   1. Successful implantation of Karl Lawson Medtronic dual-chamber pacemaker for symptomatic bradycardia due to 2-1 heart block  2. No early apparent complications.    Consultations: cardiology  Discharge Exam: Vitals:   08/25/23 0826 08/25/23  1159  BP:  124/82  Pulse: 92 90  Resp:    Temp:    SpO2: 97% 99%    No concerns other than chronic hip pain Brother in law at bedside  General: No acute distress. Cardiovascular: RRR Lungs: unlabored Neurological: Alert and oriented 3. Moves all extremities 4 with equal strength. Cranial nerves II through XII grossly intact. Extremities: No clubbing or cyanosis. No edema.  Discharge Instructions   Discharge Instructions     Call MD for:  difficulty breathing, headache or visual disturbances   Complete by: As directed    Call MD for:  extreme fatigue   Complete by: As directed    Call MD for:  hives   Complete by: As directed    Call MD for:  persistant dizziness or light-headedness   Complete by: As directed    Call MD for:  persistant nausea and vomiting   Complete by: As directed    Call MD for:  redness, tenderness, or signs of infection (pain, swelling, redness, odor or green/yellow discharge around incision site)   Complete by: As directed    Call MD for:  severe uncontrolled pain   Complete by: As directed    Call MD for:  temperature >100.4   Complete by: As directed    Diet - low sodium heart healthy   Complete by: As directed    Discharge instructions   Complete by: As directed    You were seen for bronchitis and possible aspiration as well as confusion.  I think the confusion was related to medicines and your infection.  Avoid medicines that can cause confusion in the future.  Use caution with over the counter potentially sedating meds (medications that have benadryl, etc.  You were taking robitussin and dayquil when you got confused, review this with your PCP before trying these again).   You've been treated with antibiotics for bronchitis.  You should have repeat chest imaging as an outpatient.  You had heart block and have had Karl Lawson pacemaker placed.  You should follow up with cardiology as an outpatient.  You're prone to orthostasis due to your parkinson's.  Change positions slowly.  If you have issues with lightheadedness or  your blood pressure falling with changes in position, your blood pressure medicines may need to be reduced.  Your calcium level is high today.  You'll need repeat labs outpatient and additional workup.   You should have potassium levels checked within [name] week to determine if you need continued potassium supplementation.  We'll reduce your insulin.  You'll need to continue to follow your blood sugars with your outpatient provider.  Return for new, recurrent, or worsening symptoms.  Please ask your PCP to request records from this hospitalization so they know what was done and what the next steps will be.   Increase activity slowly   Complete by: As directed       Allergies as of 08/25/2023   No Known Allergies      Medication List     STOP taking these medications    NOVOLOG MIX 70/30 [place]   simvastatin 40 MG tablet Commonly known as: ZOCOR       TAKE these medications    acetaminophen 500 MG tablet Commonly known as: TYLENOL Take 500 mg by mouth every 6 (six) hours as needed for moderate pain.   albuterol 108 (90 Base) MCG/ACT inhaler Commonly known as: VENTOLIN HFA Inhale 2 puffs into the lungs every 6 (six) hours as  needed for wheezing or shortness of breath.   amLODipine 10 MG tablet Commonly known as: NORVASC Take 1 tablet (10 mg total) by mouth daily. Start taking on: August 26, 2023   aspirin EC 81 MG tablet Take 81 mg by mouth daily.   atorvastatin 40 MG tablet Commonly known as: LIPITOR Take 1 tablet (40 mg total) by mouth daily. Start taking on: August 26, 2023   azithromycin 250 MG tablet Commonly known as: ZITHROMAX Take 1 tablet (250 mg total) by mouth daily. Take first 2 tablets together, then 1 every day until finished.   carbidopa-levodopa 25-100 MG tablet Commonly known as: SINEMET IR TAKE 1.5 TABLETS BY MOUTH 3 (THREE) TIMES DAILY. 6AM/10-11AM/3-4PM   chlorthalidone 25 MG tablet Commonly known as: HYGROTON Take 25 mg by mouth daily.    dorzolamide-timolol 2-0.5 % ophthalmic solution Commonly known as: COSOPT Place 1 drop into the left eye 2 (two) times daily.   finasteride 5 MG tablet Commonly known as: PROSCAR Take 1 tablet (5 mg total) by mouth daily.   Glucose 15 g Pack Take 15 g by mouth as needed (hypoglycemia).   hydrALAZINE 10 MG tablet Commonly known as: APRESOLINE Take 1 tablet (10 mg total) by mouth every 8 (eight) hours.   insulin aspart 100 UNIT/ML injection Commonly known as: novoLOG Inject 0-9 Units into the skin 3 (three) times daily with meals. CBG < 70:treat low blood sugar CBG 70 - 120: 0 units  CBG 121 - 150: 1 unit  CBG 151 - 200: 2 units  CBG 201 - 250: 3 units  CBG 251 - 300: 5 units  CBG 301 - 350: 7 units  CBG 351 - 400: 9 units  CBG > 400: call MD   Jardiance 10 MG Tabs tablet Generic drug: empagliflozin Take 10 mg by mouth daily.   losartan 100 MG tablet Commonly known as: COZAAR Take 1 tablet (100 mg total) by mouth daily.   Metamucil Fiber Chew Chew 1 each by mouth daily.   metFORMIN 1000 MG tablet Commonly known as: GLUCOPHAGE Take 1 tablet (1,000 mg total) by mouth 2 (two) times daily with Mansour Balboa meal.   omeprazole 40 MG capsule Commonly known as: PRILOSEC Take 1 capsule (40 mg total) by mouth daily.   tobramycin 0.3 % ophthalmic solution Commonly known as: TOBREX 1 drop every 4 (four) hours.   traMADol 50 MG tablet Commonly known as: ULTRAM Take 50 mg by mouth every 6 (six) hours as needed (pain).       No Known Allergies  Contact information for follow-up providers     Mliss Sax, MD Follow up.   Specialty: Family Medicine Contact information: [address] [place] [address] [phone]         [facility] Follow up.   Specialty: Electrophysiology Contact information: [address] [place] [address] [phone]             Contact information for  after-discharge care     Destination     [facility] Preferred SNF .   Service: Skilled Nursing Contact information: [address] [place] [address] [phone]                      The results of significant diagnostics from this hospitalization (including imaging, microbiology, ancillary and laboratory) are listed below for reference.    Significant Diagnostic Studies: DG Chest 2 View  Result Date:  08/25/2023 CLINICAL DATA:  Pacemaker placement. EXAM: CHEST - 2 VIEW COMPARISON:  August 17, 2023. FINDINGS: The heart size and mediastinal contours are within normal limits. Interval placement of left-sided pacemaker with leads in grossly good position. Minimal bibasilar subsegmental atelectasis is noted. No pneumothorax. The visualized skeletal structures are unremarkable. IMPRESSION: Interval placement of left-sided pacemaker with leads in grossly good position. Minimal bibasilar subsegmental atelectasis. Aortic Atherosclerosis (ICD10-I70.0). Electronically Signed   By: Lupita Raider M.D.   On: 08/25/2023 12:45   EP PPM/ICD IMPLANT  Result Date: 08/24/2023 CONCLUSIONS:  1. Successful implantation of Karl Lawson Medtronic dual-chamber pacemaker for symptomatic bradycardia due to 2-1 heart block  2. No early apparent complications.       [name], MD 08/24/2023 8:04 PM   CT Chest Wo Contrast  Result Date: 08/18/2023 CLINICAL DATA:  Respiratory illness, shortness of breath EXAM: CT CHEST WITHOUT CONTRAST TECHNIQUE: Multidetector CT imaging of the chest was performed following the standard protocol without IV contrast. RADIATION DOSE REDUCTION: This exam was performed according to the departmental dose-optimization program which includes automated exposure control, adjustment of the mA and/or kV according to patient size and/or use of iterative reconstruction technique. COMPARISON:  Chest x-ray 08/17/2023.  Chest CT 04/05/2013 FINDINGS: Cardiovascular:  Heart is normal size. Aorta is normal caliber. Diffuse coronary artery atherosclerosis. Moderate aortic calcifications. Mediastinum/Nodes: No mediastinal, hilar, or axillary adenopathy. Trachea and esophagus are unremarkable. Thyroid unremarkable. Lungs/Pleura: Mild centrilobular emphysema. Mild peribronchial thickening in the lower lobes bilaterally with mucous plugging in several lower lobe airways. Ground-glass opacities in the lung bases, likely atelectasis. No effusions. Upper Abdomen: Multiple layering gallstones within the gallbladder. No acute findings. Musculoskeletal: Chest wall soft tissues are unremarkable. No acute bony abnormality. IMPRESSION: Peribronchial thickening and mucous plugging in the lower lobe airways compatible with bronchitis. Ground-glass opacities in the lung bases, likely atelectasis. Diffuse coronary artery disease. Aortic Atherosclerosis (ICD10-I70.0) and Emphysema (ICD10-J43.9). Electronically Signed   By: Charlett Nose M.D.   On: 08/18/2023 20:12   ABORTED INVASIVE LAB PROCEDURE  Result Date: 08/18/2023 This case was aborted.  MR Brain Wo Contrast (neuro protocol)  Result Date: 08/17/2023 CLINICAL DATA:  Initial evaluation for neuro deficit, stroke suspected. EXAM: MRI HEAD WITHOUT CONTRAST TECHNIQUE: Multiplanar, multiecho pulse sequences of the brain and surrounding structures were obtained without intravenous contrast. COMPARISON:  None Available. FINDINGS: Brain: Examination moderately degraded by motion. Generalized age-related cerebral atrophy. Patchy T2/FLAIR hyperintensity involving the periventricular and deep white matter, most likely related chronic microvascular ischemic disease, mild for age. No evidence for acute or subacute ischemia. No visible areas of chronic cortical infarction. No visible acute or chronic intracranial blood products, although the SWI sequences are severely degraded by motion. No mass lesion, midline shift or mass effect. No hydrocephalus  or extra-axial fluid collection. Pituitary gland suprasellar region within normal limits. Vascular: Major intracranial vascular flow voids are maintained. Skull and upper cervical spine: Craniocervical junction within normal limits. Bone marrow signal intensity normal. No scalp soft tissue abnormality. Sinuses/Orbits: Prior bilateral ocular lens replacement. Moderate mucosal thickening present throughout the paranasal sinuses. No mastoid effusion. Other: None. IMPRESSION: 1. Motion degraded exam. 2. No acute intracranial abnormality. 3. Age-related cerebral atrophy with mild chronic small vessel ischemic disease. Electronically Signed   By: Rise Mu M.D.   On: 08/17/2023 04:11   DG Chest Portable 1 View  Result Date: 08/17/2023 CLINICAL DATA:  Shortness of breath, dizziness. EXAM: PORTABLE CHEST 1 VIEW COMPARISON:  02/03/2022. FINDINGS: The heart size and  mediastinal contours are within normal limits. There is atherosclerotic calcification of the aorta. Lung volumes are low. No consolidation, effusion, or pneumothorax. No acute osseous abnormality. IMPRESSION: No active disease. Electronically Signed   By: Thornell Sartorius M.D.   On: 08/17/2023 02:14    Microbiology: Recent Results (from the past 240 hour(s))  Resp panel by RT-PCR (RSV, Flu Tanishia Lemaster&B, Covid) Anterior Nasal Swab     Status: None   Collection Time: 08/17/23  2:13 AM   Specimen: Anterior Nasal Swab  Result Value Ref Range Status   SARS Coronavirus 2 by RT PCR NEGATIVE NEGATIVE Final   Influenza Shea Kapur by PCR NEGATIVE NEGATIVE Final   Influenza B by PCR NEGATIVE NEGATIVE Final    Comment: (NOTE) The Xpert Xpress SARS-CoV-2/FLU/RSV plus assay is intended as an aid in the diagnosis of influenza from Nasopharyngeal swab specimens and should not be used as Rilda Bulls sole basis for treatment. Nasal washings and aspirates are unacceptable for Xpert Xpress SARS-CoV-2/FLU/RSV testing.  Fact Sheet for  Patients: BloggerCourse.com  Fact Sheet for Healthcare Providers: SeriousBroker.it  This test is not yet approved or cleared by the [place] FDA and has been authorized for detection and/or diagnosis of SARS-CoV-2 by FDA under an Emergency Use Authorization (EUA). This EUA will remain in effect (meaning this test can be used) for the duration of the COVID-19 declaration under Section 564(b)(1) of the Act, 21 U.S.C. section 360bbb-3(b)(1), unless the authorization is terminated or revoked.     Resp Syncytial Virus by PCR NEGATIVE NEGATIVE Final    Comment: (NOTE) Fact Sheet for Patients: BloggerCourse.com  Fact Sheet for Healthcare Providers: SeriousBroker.it  This test is not yet approved or cleared by the [place] FDA and has been authorized for detection and/or diagnosis of SARS-CoV-2 by FDA under an Emergency Use Authorization (EUA). This EUA will remain in effect (meaning this test can be used) for the duration of the COVID-19 declaration under Section 564(b)(1) of the Act, 21 U.S.C. section 360bbb-3(b)(1), unless the authorization is terminated or revoked.  Performed at [facility], [address], [place], [place] [address]   Surgical PCR screen     Status: None   Collection Time: 08/18/23 12:18 PM   Specimen: Nasal Mucosa; Nasal Swab  Result Value Ref Range Status   MRSA, PCR NEGATIVE NEGATIVE Final   Staphylococcus aureus NEGATIVE NEGATIVE Final    Comment: (NOTE) The Xpert SA Assay (FDA approved for NASAL specimens in patients 22 years of age and older), is one component of Kaysen Deal comprehensive surveillance program. It is not intended to diagnose infection nor to guide or monitor treatment. Performed at [facility], [address], [place], [place] [address]   Culture, blood (Routine X 2) w Reflex to ID Panel     Status: None   Collection Time:  08/18/23  3:56 PM   Specimen: BLOOD RIGHT ARM  Result Value Ref Range Status   Specimen Description BLOOD RIGHT ARM  Final   Special Requests   Final    BOTTLES DRAWN AEROBIC AND ANAEROBIC Blood Culture adequate volume   Culture   Final    NO GROWTH 5 DAYS Performed at [facility], [address], [place], [place] [address]    Report Status 08/23/2023 FINAL  Final  Culture, blood (Routine X 2) w Reflex to ID Panel     Status: None   Collection Time: 08/18/23  3:58 PM   Specimen: BLOOD RIGHT ARM  Result Value Ref  Range Status   Specimen Description BLOOD RIGHT ARM  Final   Special Requests   Final    BOTTLES DRAWN AEROBIC AND ANAEROBIC Blood Culture adequate volume   Culture   Final    NO GROWTH 5 DAYS Performed at [facility], [address], [place], [place] [address]    Report Status 08/23/2023 FINAL  Final  MRSA Next Gen by PCR, Nasal     Status: None   Collection Time: 08/24/23 12:09 PM   Specimen: Nasal Mucosa; Nasal Swab  Result Value Ref Range Status   MRSA by PCR Next Gen NOT DETECTED NOT DETECTED Final    Comment: (NOTE) The GeneXpert MRSA Assay (FDA approved for NASAL specimens only), is one component of Ramsey Midgett comprehensive MRSA colonization surveillance program. It is not intended to diagnose MRSA infection nor to guide or monitor treatment for MRSA infections. Test performance is not FDA approved in patients less than 26 years old. Performed at [facility], [address], [place], [place] [address]      Labs: Basic Metabolic Panel: Recent Labs  Lab 08/19/23 0648 08/20/23 0501 08/24/23 0405 08/25/23 0638  NA 140 140 140 135  K 3.7 3.8 4.1 4.7  CL 106 106 102 98  CO2 22 20* 22 21*  GLUCOSE 138* 165* 232* 145*  BUN 26* 20 25* 25*  CREATININE 1.40* 1.12 1.13 1.19  CALCIUM 9.9 10.0 10.8* 10.6*  MG  --   --  1.9 1.8  PHOS  --   --  2.6 2.9   Liver Function Tests: Recent Labs  Lab 08/24/23 0405  AST 24  ALT 10  ALKPHOS 72   BILITOT 1.1  PROT 5.8*  ALBUMIN 2.8*   No results for input(s): "LIPASE", "AMYLASE" in the last 168 hours. No results for input(s): "AMMONIA" in the last 168 hours. CBC: Recent Labs  Lab 08/19/23 0648 08/24/23 0405 08/25/23 0638  WBC 8.4 12.6* 10.9*  NEUTROABS 5.8 9.5*  --   HGB 13.5 13.2 14.3  HCT 39.8 40.3 44.1  MCV 81.7 79.8* 82.7  PLT 269 223 234   Cardiac Enzymes: No results for input(s): "CKTOTAL", "CKMB", "CKMBINDEX", "TROPONINI" in the last 168 hours. BNP: BNP (last 3 results) No results for input(s): "BNP" in the last 8760 hours.  ProBNP (last 3 results) No results for input(s): "PROBNP" in the last 8760 hours.  CBG: Recent Labs  Lab 08/24/23 1213 08/24/23 1709 08/24/23 2110 08/25/23 0754 08/25/23 1251  GLUCAP 183* 141* 152* 151* 180*       Signed:  Lacretia Nicks MD.  [facility] 08/25/2023, 2:00 PM

## 2023-08-29 NOTE — Progress Notes (Signed)
Assessment/Plan:   1.  Parkinsons disease, diagnosed September, 2023             -Continue carbidopa/levodopa 25/100, 1.5 tablet 3 times per day at 6am/10-11am/3-4pm.  His PT wrote a note stating that he has trouble remembering his med tid and she thought that we could give a higher dose bid and he asks about that.  Discussed with him the short half life of levodopa and that its a tid medication.  -discussed exercise and info to community programs discussed   2.  Alcohol use             -max 2 times per week, which is what he was doing prior to SNF   3.  Bilateral ptosis versus pseudoptosis             -Acetylcholine receptor antibodies were negative             -Patient is already seeing both ophthalmology as well as a retina specialist for macular degeneration   4.  Probable diabetic peripheral neuropathy             -Patient sees endocrinology outside of our system.    5.  LBP  -seeing sports med, Dr. Pearletha Forge and Dr. Jordan Likes  6.  History of hypertension  -On multiple antihypertensives.  Blood pressure was low in the hospital and he was orthostatic in the hospital.  He and I discussed concept of permissive hypertension in Parkinsons disease.  Patient is currently on 4 blood pressure medications, including chlorthalidone, losartan, hydralazine, Norvasc.  He has an appt with cardiology tomorrow.  They are going to discuss this issue  Subjective:   Karl Lawson. was seen today in follow up for Parkinsons disease.  My previous records were reviewed prior to todays visit as well as outside records available to me.  Pt with son who supplements the history.   We slightly increased his levodopa last visit.  He tolerated that well, without side effects.  He was in the urgent care in July with a stage II pressure ulcer.  This was actually on his elbows from leaning on them at the computer.  He was admitted to the hospital on October 9 until October 18 with mental status change.  His son told  staff that it started after he started levodopa, but patient had been on levodopa for 1 year at that point in time.  They noted that patient had been sick and developed a cough and started taking over-the-counter Robitussin and then that caused hallucinations.  He switched to NyQuil.  This generated more confusion.  Hospital felt that this is likely the source of his confusion.  He was empirically started on Rocephin and azithromycin for possible aspiration pneumonia and bronchitis.  He had fevers during the hospitalization.  During his hospitalization he also had a pacemaker placed by cardiology for bradycardia.  Hospital noted that he had some lower blood pressures and felt that his medicines may need to be changed on an outpatient basis.  They also noted that his blood sugars were likely low at home and changed his diabetic medications in the hospital.  His PT wrote a note stating that he has trouble remembering his med tid and she thought that we could give a higher dose bid and he asks about that.  He is in SNF for rehab probably for a few more weeks.  No hallucinations there but has some visual distortions.  Current prescribed movement disorder  medications: Carbidopa/levodopa 25/100, 1.5 tablets 3 times per day (increased)  Prior medications: Hallucinations with Robitussin  ALLERGIES:  No Known Allergies  CURRENT MEDICATIONS:  Current Meds  Medication Sig   acetaminophen (TYLENOL) 500 MG tablet Take 500 mg by mouth every 6 (six) hours as needed for moderate pain.   albuterol (VENTOLIN HFA) 108 (90 Base) MCG/ACT inhaler Inhale 2 puffs into the lungs every 6 (six) hours as needed for wheezing or shortness of breath.   amLODipine (NORVASC) 10 MG tablet Take 1 tablet (10 mg total) by mouth daily.   aspirin EC 81 MG tablet Take 81 mg by mouth daily.   atorvastatin (LIPITOR) 40 MG tablet Take 1 tablet (40 mg total) by mouth daily.   azithromycin (ZITHROMAX) 250 MG tablet Take 1 tablet (250 mg total)  by mouth daily. Take first 2 tablets together, then 1 every day until finished.   carbidopa-levodopa (SINEMET IR) 25-100 MG tablet TAKE 1.5 TABLETS BY MOUTH 3 (THREE) TIMES DAILY. 6AM/10-11AM/3-4PM   chlorthalidone (HYGROTON) 25 MG tablet Take 25 mg by mouth daily.   dorzolamide-timolol (COSOPT) 2-0.5 % ophthalmic solution Place 1 drop into the left eye 2 (two) times daily.   finasteride (PROSCAR) 5 MG tablet Take 1 tablet (5 mg total) by mouth daily.   Glucose 15 g PACK Take 15 g by mouth as needed (hypoglycemia).   hydrALAZINE (APRESOLINE) 10 MG tablet Take 1 tablet (10 mg total) by mouth every 8 (eight) hours.   insulin aspart (NOVOLOG) 100 UNIT/ML injection Inject 0-9 Units into the skin 3 (three) times daily with meals. CBG < 70:treat low blood sugar CBG 70 - 120: 0 units  CBG 121 - 150: 1 unit  CBG 151 - 200: 2 units  CBG 201 - 250: 3 units  CBG 251 - 300: 5 units  CBG 301 - 350: 7 units  CBG 351 - 400: 9 units  CBG > 400: call MD   JARDIANCE 10 MG TABS tablet Take 10 mg by mouth daily.   losartan (COZAAR) 100 MG tablet Take 1 tablet (100 mg total) by mouth daily.   Metamucil Fiber CHEW Chew 1 each by mouth daily.   metFORMIN (GLUCOPHAGE) 1000 MG tablet Take 1 tablet (1,000 mg total) by mouth 2 (two) times daily with a meal.   omeprazole (PRILOSEC) 40 MG capsule Take 1 capsule (40 mg total) by mouth daily.   tobramycin (TOBREX) 0.3 % ophthalmic solution 1 drop every 4 (four) hours.   traMADol (ULTRAM) 50 MG tablet Take 50 mg by mouth every 6 (six) hours as needed (pain).     Objective:   PHYSICAL EXAMINATION:    VITALS:   Vitals:   09/05/23 0904  BP: 106/60  Pulse: (!) 50  SpO2: 98%  Height: 5' 8.5" (1.74 m)     GEN:  The patient appears stated age and is in NAD. HEENT:  Normocephalic, atraumatic.  The mucous membranes are moist. The superficial temporal arteries are without ropiness or tenderness. CV:  brady.  regular Lungs:  CTAB Neck/HEME:  There are no carotid  bruits bilaterally.   Neurological examination:   Orientation: The patient is alert and oriented x3.  Cranial nerves: There is good facial symmetry.  There is bilateral ptosis vs pseudoptosis.  There is hypomimia with lips parted at times.  The speech is fluent and clear. Soft palate rises symmetrically and there is no tongue deviation. Hearing is intact to conversational tone. Sensation: Sensation is intact to light touch throughout  Motor: Strength is at least antigravity x 4 Deep tendon reflexes: Deep tendon reflexes are 1/4 at the bilateral biceps, triceps, brachioradialis, patella and absent at the bilaterally achilles. Plantar responses are downgoing bilaterally.   Movement examination: Tone: There is nl tone today Abnormal movements: there is rare tremor in the L thumb Coordination:  There is no decremation with any form of RAMS, including alternating supination and pronation of the forearm, hand opening and closing, finger taps, heel taps and toe taps.  Gait and Station: The patient pushes off to arise.  His L arm is in a sling.  He leans on the examiner.  He has a few stutter steps but does well in hall.   I have reviewed and interpreted the following labs independently    Chemistry      Component Value Date/Time   NA 135 08/25/2023 0638   NA 142 05/17/2022 1354   K 4.7 08/25/2023 0638   CL 98 08/25/2023 0638   CO2 21 (L) 08/25/2023 0638   BUN 25 (H) 08/25/2023 0638   BUN 10 05/17/2022 1354   CREATININE 1.19 08/25/2023 0638      Component Value Date/Time   CALCIUM 10.6 (H) 08/25/2023 0638   ALKPHOS 72 08/24/2023 0405   AST 24 08/24/2023 0405   ALT 10 08/24/2023 0405   BILITOT 1.1 08/24/2023 0405       Lab Results  Component Value Date   WBC 10.9 (H) 08/25/2023   HGB 14.3 08/25/2023   HCT 44.1 08/25/2023   MCV 82.7 08/25/2023   PLT 234 08/25/2023    Lab Results  Component Value Date   TSH 1.745 08/17/2023     Total time spent on today's visit was 30  minutes, including both face-to-face time and nonface-to-face time.  Time included that spent on review of records (prior notes available to me/labs/imaging if pertinent), discussing treatment and goals, answering patient's questions and coordinating care.  Cc:  Mliss Sax, MD

## 2023-08-30 ENCOUNTER — Telehealth: Payer: Self-pay

## 2023-08-30 ENCOUNTER — Ambulatory Visit: Payer: Medicare Other

## 2023-08-30 NOTE — Patient Outreach (Addendum)
  Care Coordination   Care Coordination  Visit Note   08/30/2023 Name: Karl Lawson. MRN: [id number] DOB: 1946/08/25  Karl Lawson. is a 77 y.o. year old male who sees Mliss Sax, MD for primary care. I  called [employer].  Patient is inpatient at facility at this time. CM will follow up as to status at facility.  Spoke with son Karl Lawson, he states patient is doing good right now and will probably remain at the facility for about 1 month.  He states he does have a contact person at [facility] but not sure of the name presently.  He did mention wanting to tap into Dad's [facility] benefit. He has some information.  Advised that CM would send more information to start the process.  He verbalized understanding.    What matters to the patients health and wellness today?  Getting stronger to go home      SDOH assessments and interventions completed:  Yes     Care Coordination Interventions:  Yes, provided   Follow up plan:  RN CM will continue to follow rehab admission and outreach as appropriate    Encounter Outcome:  Patient Visit Completed   Bary Leriche, RN, MSN [facility]  [facility] Health Care Management Community Coordinator Direct Dial: [phone]  Fax: [phone] Website: [email or web address]

## 2023-09-05 ENCOUNTER — Ambulatory Visit (INDEPENDENT_AMBULATORY_CARE_PROVIDER_SITE_OTHER): Payer: Medicare Other | Admitting: Neurology

## 2023-09-05 ENCOUNTER — Encounter: Payer: Self-pay | Admitting: Neurology

## 2023-09-05 VITALS — BP 106/60 | HR 50 | Ht 68.5 in

## 2023-09-05 DIAGNOSIS — I952 Hypotension due to drugs: Secondary | ICD-10-CM

## 2023-09-05 DIAGNOSIS — G20A1 Parkinson's disease without dyskinesia, without mention of fluctuations: Secondary | ICD-10-CM | POA: Diagnosis not present

## 2023-09-05 NOTE — Patient Instructions (Signed)
Good to see you today!  The physicians and staff at [facility] are committed to providing excellent care. You may receive a survey requesting feedback about your experience at our office. We strive to receive "very good" responses to the survey questions. If you feel that your experience would prevent you from giving the office a "very good " response, please contact our office to try to remedy the situation. We may be reached at [phone]. Thank you for taking the time out of your busy day to complete the survey.     

## 2023-09-06 ENCOUNTER — Ambulatory Visit: Payer: Medicare Other | Attending: Cardiology

## 2023-09-06 DIAGNOSIS — I4430 Unspecified atrioventricular block: Secondary | ICD-10-CM

## 2023-09-06 LAB — CUP PACEART INCLINIC DEVICE CHECK
Battery Remaining Longevity: 136 mo
Battery Voltage: 3.22 V
Brady Statistic AP VP Percent: 31.62 %
Brady Statistic AP VS Percent: 0.14 %
Brady Statistic AS VP Percent: 67.49 %
Brady Statistic AS VS Percent: 0.75 %
Brady Statistic RA Percent Paced: 31.77 %
Brady Statistic RV Percent Paced: 99.11 %
Date Time Interrogation Session: 20241030154415
Implantable Lead Connection Status: 753985
Implantable Lead Connection Status: 753985
Implantable Lead Implant Date: 20241017
Implantable Lead Implant Date: 20241017
Implantable Lead Location: 753859
Implantable Lead Location: 753860
Implantable Lead Model: 3830
Implantable Lead Model: 5076
Implantable Pulse Generator Implant Date: 20241017
Lead Channel Impedance Value: 399 Ohm
Lead Channel Impedance Value: 437 Ohm
Lead Channel Impedance Value: 494 Ohm
Lead Channel Impedance Value: 608 Ohm
Lead Channel Pacing Threshold Amplitude: 0.625 V
Lead Channel Pacing Threshold Amplitude: 0.75 V
Lead Channel Pacing Threshold Pulse Width: 0.4 ms
Lead Channel Pacing Threshold Pulse Width: 0.4 ms
Lead Channel Sensing Intrinsic Amplitude: 0 mV
Lead Channel Sensing Intrinsic Amplitude: 19.125 mV
Lead Channel Sensing Intrinsic Amplitude: 20.25 mV
Lead Channel Sensing Intrinsic Amplitude: 7.125 mV
Lead Channel Sensing Intrinsic Amplitude: 7.125 mV
Lead Channel Setting Pacing Amplitude: 3.5 V
Lead Channel Setting Pacing Amplitude: 3.5 V
Lead Channel Setting Pacing Pulse Width: 0.4 ms
Lead Channel Setting Sensing Sensitivity: 1.2 mV
Zone Setting Status: 755011

## 2023-09-06 NOTE — Patient Instructions (Signed)
   After Your Pacemaker   Monitor your pacemaker site for redness, swelling, and drainage. Call the device clinic at [phone] if you experience these symptoms or fever/chills.  Your incision was closed with Steri-strips or staples:  You may shower 7 days after your procedure and wash your incision with soap and water. Avoid lotions, ointments, or perfumes over your incision until it is well-healed.  You may use a hot tub or a pool after your wound check appointment if the incision is completely closed.  Do not lift, push or pull greater than 10 pounds with the affected arm until 4 weeks after your procedure. There are no other restrictions in arm movement after your wound check appointment.  You may drive, unless driving has been restricted by your healthcare providers.  Remote monitoring is used to monitor your pacemaker from home. This monitoring is scheduled every 91 days by our office. It allows [place] to keep an eye on the functioning of your device to ensure it is working properly. You will routinely see your Electrophysiologist annually (more often if necessary).

## 2023-09-06 NOTE — Progress Notes (Signed)
Wound check appointment. Steri-strips removed. Wound without redness or edema. Incision edges approximated, wound well healed. Normal device function. Thresholds, sensing, and impedances consistent with implant measurements. Device programmed at 3.5V/auto capture programmed on for extra safety margin until 3 month visit. Histogram distribution appropriate for patient and level of activity. No mode switches or high ventricular rates noted. Patient educated about wound care, arm mobility, lifting restrictions. ROV in 3 months with implanting physician.  Patient had questions regarding blood pressure medications.Per review of office note with neurology requesting Pt decrease blood pressure medication regimen to allow for permissive hypertension.   Per Dr. Lalla Brothers okay to stop hydralazine.

## 2023-09-07 LAB — CUP PACEART REMOTE DEVICE CHECK
Battery Remaining Longevity: 135 mo
Battery Voltage: 3.22 V
Brady Statistic AP VP Percent: 31.6 %
Brady Statistic AP VS Percent: 0.14 %
Brady Statistic AS VP Percent: 67.51 %
Brady Statistic AS VS Percent: 0.75 %
Brady Statistic RA Percent Paced: 31.75 %
Brady Statistic RV Percent Paced: 99.11 %
Date Time Interrogation Session: 20241030151431
Implantable Lead Connection Status: 753985
Implantable Lead Connection Status: 753985
Implantable Lead Implant Date: 20241017
Implantable Lead Implant Date: 20241017
Implantable Lead Location: 753859
Implantable Lead Location: 753860
Implantable Lead Model: 3830
Implantable Lead Model: 5076
Implantable Pulse Generator Implant Date: 20241017
Lead Channel Impedance Value: 380 Ohm
Lead Channel Impedance Value: 399 Ohm
Lead Channel Impedance Value: 456 Ohm
Lead Channel Impedance Value: 589 Ohm
Lead Channel Pacing Threshold Amplitude: 0.625 V
Lead Channel Pacing Threshold Amplitude: 0.75 V
Lead Channel Pacing Threshold Pulse Width: 0.4 ms
Lead Channel Pacing Threshold Pulse Width: 0.4 ms
Lead Channel Sensing Intrinsic Amplitude: 20.25 mV
Lead Channel Sensing Intrinsic Amplitude: 20.25 mV
Lead Channel Sensing Intrinsic Amplitude: 7.125 mV
Lead Channel Sensing Intrinsic Amplitude: 7.125 mV
Lead Channel Setting Pacing Amplitude: 3.5 V
Lead Channel Setting Pacing Amplitude: 3.5 V
Lead Channel Setting Pacing Pulse Width: 0.4 ms
Lead Channel Setting Sensing Sensitivity: 1.2 mV
Zone Setting Status: 755011

## 2023-09-08 ENCOUNTER — Telehealth: Payer: Self-pay

## 2023-09-08 NOTE — Patient Instructions (Signed)
Visit Information  Thank you for taking time to visit with me today. Please don't hesitate to contact me if I can be of assistance to you.   Following are the goals we discussed today:   Goals Addressed             This Visit's Progress    Diabetes and Parkinson's Management       Care Coordination Interventions: Evaluation of current treatment plan related to Parkinson's and patient's adherence to plan as established by provider Discussed plans with patient for ongoing care management follow up and provided patient with direct contact information for care management team Provided education to patient about basic DM disease process Reviewed medications with patient and discussed importance of medication adherence  Spoke with son Lemmie Evens. He reports is still in rehab.  Making some progress but still wobbly with ambulation.  He meets with care team to discuss progress. No discharge plans yet. He did receive the resource information on [place] services but just has not really looked at it.  No concerns.           If you are experiencing a Mental Health or Behavioral Health Crisis or need someone to talk to, please call the Suicide and Crisis Lifeline: 988   Patient verbalizes understanding of instructions and care plan provided today and agrees to view in MyChart. Active MyChart status and patient understanding of how to access instructions and care plan via MyChart confirmed with patient.     The patient has been provided with contact information for the care management team and has been advised to call with any health related questions or concerns.   Bary Leriche, RN, MSN [facility], [facility] Management Community Coordinator Direct Dial: [phone]  Fax: [phone] Website: [email or web address]

## 2023-09-08 NOTE — Patient Outreach (Signed)
  Care Coordination   Follow Up Visit Note   09/08/2023 Name: Karl Lawson. MRN: [id number] DOB: January 18, 1946  [name]. is a 77 y.o. year old male who sees Mliss Sax, MD for primary care. I  Spoke with son Lemmie Evens by phone today.  What matters to the patients health and wellness today?  Getting out of rehab    Goals Addressed             This Visit's Progress    Diabetes and Parkinson's Management       Care Coordination Interventions: Evaluation of current treatment plan related to Parkinson's and patient's adherence to plan as established by provider Discussed plans with patient for ongoing care management follow up and provided patient with direct contact information for care management team Provided education to patient about basic DM disease process Reviewed medications with patient and discussed importance of medication adherence  Spoke with son Lemmie Evens. He reports is still in rehab.  Making some progress but still wobbly with ambulation.  He meets with care team to discuss progress. No discharge plans yet. He did receive the resource information on [place] services but just has not really looked at it.  No concerns.          SDOH assessments and interventions completed:  Yes     Care Coordination Interventions:  Yes, provided   Follow up plan:  RN CM will outreach as appropriate pending status at SNF    Encounter Outcome:  Patient Visit Completed   Bary Leriche, RN, MSN [facility]  [facility], [facility] Health Care Management Community Coordinator Direct Dial: [phone]  Fax: [phone] Website: [email or web address]

## 2023-09-15 ENCOUNTER — Telehealth: Payer: Self-pay | Admitting: Family Medicine

## 2023-09-15 NOTE — Telephone Encounter (Signed)
Pt is getting out of rehab on 09/16/23, was in hosp from 10/9-10/18/24. He has a hosp f/up with Dr Doreene Burke for 09/21/23.

## 2023-09-19 ENCOUNTER — Telehealth: Payer: Self-pay

## 2023-09-19 NOTE — Patient Outreach (Signed)
  Care Coordination   Follow Up Visit Note   09/19/2023 Name: Jex Arkin. MRN: [id number] DOB: 07-May-1946  [name]. is a 77 y.o. year old male who sees Mliss Sax, MD for primary care. I spoke with  [name]. by phone today.  What matters to the patients health and wellness today?  Getting better    Goals Addressed             This Visit's Progress    Diabetes and Parkinson's Management       Care Coordination Interventions: Evaluation of current treatment plan related to Parkinson's and patient's adherence to plan as established by provider Discussed plans with patient for ongoing care management follow up and provided patient with direct contact information for care management team Provided education to patient about basic DM disease process Reviewed medications with patient and discussed importance of medication adherence  Spoke with patient. He reports he is doing better.  Home from rehab.  He is home alone but son checks on him frequently. He reports home health to visit today between 12-1 pm. PCP appointment on Thursday for follow up. He reports he has all his medication. He is using a pillbox to organize.  Blood sugars doing good per patient.  He reports last sugar 137 this am.  Discussed diabetes management and home safety.  He verbalized understanding.  No concerns.          SDOH assessments and interventions completed:  Yes  SDOH Interventions Today    Flowsheet Row Most Recent Value  SDOH Interventions   Transportation Interventions Intervention Not Indicated  Health Literacy Interventions Intervention Not Indicated        Care Coordination Interventions:  Yes, provided   Follow up plan: Follow up call scheduled for November 25    Encounter Outcome:  Patient Visit Completed   Bary Leriche, RN, MSN [place]  [facility], [facility] Management Community Coordinator Direct Dial:  [phone]  Fax: [phone] Website: [email or web address]

## 2023-09-19 NOTE — Patient Instructions (Signed)
Visit Information  Thank you for taking time to visit with me today. Please don't hesitate to contact me if I can be of assistance to you.   Following are the goals we discussed today:   Goals Addressed             This Visit's Progress    Diabetes and Parkinson's Management       Care Coordination Interventions: Evaluation of current treatment plan related to Parkinson's and patient's adherence to plan as established by provider Discussed plans with patient for ongoing care management follow up and provided patient with direct contact information for care management team Provided education to patient about basic DM disease process Reviewed medications with patient and discussed importance of medication adherence  Spoke with patient. He reports he is doing better.  Home from rehab.  He is home alone but son checks on him frequently. He reports home health to visit today between 12-1 pm. PCP appointment on Thursday for follow up. He reports he has all his medication. He is using a pillbox to organize.  Blood sugars doing good per patient.  He reports last sugar 137 this am.  Discussed diabetes management and home safety.  He verbalized understanding.  No concerns.          Our next appointment is by telephone on 10/02/23 at 930 am  Please call the care guide team at [phone] if you need to cancel or reschedule your appointment.   If you are experiencing a Mental Health or Behavioral Health Crisis or need someone to talk to, please call the Suicide and Crisis Lifeline: 988   Patient verbalizes understanding of instructions and care plan provided today and agrees to view in MyChart. Active MyChart status and patient understanding of how to access instructions and care plan via MyChart confirmed with patient.     The patient has been provided with contact information for the care management team and has been advised to call with any health related questions or concerns.   Bary Leriche, RN, MSN [facility], [facility] Management Community Coordinator Direct Dial: [phone]  Fax: [phone] Website: [email or web address]

## 2023-09-21 ENCOUNTER — Ambulatory Visit (INDEPENDENT_AMBULATORY_CARE_PROVIDER_SITE_OTHER): Payer: Medicare Other | Admitting: Family Medicine

## 2023-09-21 ENCOUNTER — Encounter: Payer: Self-pay | Admitting: Family Medicine

## 2023-09-21 VITALS — BP 138/78 | HR 77 | Temp 97.8°F | Ht 68.0 in | Wt 202.6 lb

## 2023-09-21 DIAGNOSIS — I10 Essential (primary) hypertension: Secondary | ICD-10-CM | POA: Diagnosis not present

## 2023-09-21 DIAGNOSIS — Z09 Encounter for follow-up examination after completed treatment for conditions other than malignant neoplasm: Secondary | ICD-10-CM | POA: Diagnosis not present

## 2023-09-21 DIAGNOSIS — Z794 Long term (current) use of insulin: Secondary | ICD-10-CM | POA: Diagnosis not present

## 2023-09-21 DIAGNOSIS — E113593 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, bilateral: Secondary | ICD-10-CM

## 2023-09-21 LAB — BASIC METABOLIC PANEL
BUN: 21 mg/dL (ref 6–23)
CO2: 27 meq/L (ref 19–32)
Calcium: 9.6 mg/dL (ref 8.4–10.5)
Chloride: 100 meq/L (ref 96–112)
Creatinine, Ser: 1.1 mg/dL (ref 0.40–1.50)
GFR: 64.54 mL/min (ref >60.00)
Glucose, Bld: 133 mg/dL — ABNORMAL HIGH (ref 70–99)
Potassium: 3.8 meq/L (ref 3.5–5.1)
Sodium: 137 meq/L (ref 135–145)

## 2023-09-21 NOTE — Progress Notes (Signed)
Established Patient Office Visit   Subjective:  Patient ID: Karl Carona., male    DOB: 06-10-1946  Age: 77 y.o. MRN: [id number]  Chief Complaint  Patient presents with   Hospitalization Follow-up    [facility] follow up from dizziness. Pt now has a pacemaker and stopped amlodipine.     HPI Encounter Diagnoses  Name Primary?   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  It was thought that dextromethorphan used for a URI that led to his confusion.  Sensorium seemed to clear after he stopped taking it on hospitalization.  Dizziness thought to be associated with bradycardia was improved with pacemaker implantation.  He has not been drinking or vaping.  He was given preprandial insulin on a sliding scale while in rehab.  He is now back to taking 14 units of 7030 mix with breakfast and again with dinner.  Sugars have been running in the upper 100s.  He continues to live alone however his son has been checking on him daily.  Son has been helping with his bills otherwise patient is feeding clothing and bathing himself.   Review of Systems  Constitutional: Negative.   HENT: Negative.    Eyes:  Negative for blurred vision, discharge and redness.  Respiratory: Negative.    Cardiovascular: Negative.   Gastrointestinal:  Negative for abdominal pain.  Genitourinary: Negative.   Musculoskeletal: Negative.  Negative for myalgias.  Skin:  Negative for rash.  Neurological:  Negative for tingling, loss of consciousness and weakness.  Endo/Heme/Allergies:  Negative for polydipsia.     Current Outpatient Medications:    acetaminophen (TYLENOL) 500 MG tablet, Take 500 mg by mouth every 6 (six) hours as needed for moderate pain., Disp: , Rfl:    albuterol  (VENTOLIN HFA) 108 (90 Base) MCG/ACT inhaler, Inhale 2 puffs into the lungs every 6 (six) hours as needed for wheezing or shortness of breath., Disp: , Rfl:    aspirin EC 81 MG tablet, Take 81 mg by mouth daily., Disp: , Rfl:    atorvastatin (LIPITOR) 40 MG tablet, Take 1 tablet (40 mg total) by mouth daily., Disp: , Rfl:    carbidopa-levodopa (SINEMET IR) 25-100 MG tablet, TAKE 1.5 TABLETS BY MOUTH 3 (THREE) TIMES DAILY. 6AM/10-11AM/3-4PM, Disp: 405 tablet, Rfl: 0   chlorthalidone (HYGROTON) 25 MG tablet, Take 25 mg by mouth daily., Disp: , Rfl:    Glucose 15 g PACK, Take 15 g by mouth as needed (hypoglycemia)., Disp: , Rfl:    insulin aspart (NOVOLOG) 100 UNIT/ML injection, Inject 0-9 Units into the skin 3 (three) times daily with meals. CBG < 70:treat low blood sugar CBG 70 - 120: 0 units  CBG 121 - 150: 1 unit  CBG 151 - 200: 2 units  CBG 201 - 250: 3 units  CBG 251 - 300: 5 units  CBG 301 - 350: 7 units  CBG 351 - 400: 9 units  CBG > 400: call MD, Disp: , Rfl:    JARDIANCE 10 MG TABS tablet, Take 10 mg by mouth daily., Disp: , Rfl:    losartan (COZAAR) 100 MG tablet, Take 1 tablet (100 mg total) by mouth daily., Disp: 90 tablet, Rfl: 1   Metamucil Fiber CHEW, Chew 1 each by mouth daily., Disp: ,  Rfl:    metFORMIN (GLUCOPHAGE) 1000 MG tablet, Take 1 tablet (1,000 mg total) by mouth 2 (two) times daily with a meal., Disp: 60 tablet, Rfl: 0   omeprazole (PRILOSEC) 40 MG capsule, Take 1 capsule (40 mg total) by mouth daily., Disp: 90 capsule, Rfl: 3   tobramycin (TOBREX) 0.3 % ophthalmic solution, 1 drop every 4 (four) hours., Disp: , Rfl:    traMADol (ULTRAM) 50 MG tablet, Take 50 mg by mouth every 6 (six) hours as needed (pain)., Disp: , Rfl:    azithromycin (ZITHROMAX) 250 MG tablet, Take 1 tablet (250 mg total) by mouth daily. Take first 2 tablets together, then 1 every day until finished. (Patient not taking: Reported on 09/21/2023), Disp: 6 tablet, Rfl: 0   dorzolamide-timolol (COSOPT) 2-0.5 %  ophthalmic solution, Place 1 drop into the left eye 2 (two) times daily., Disp: , Rfl:    finasteride (PROSCAR) 5 MG tablet, Take 1 tablet (5 mg total) by mouth daily., Disp: 90 tablet, Rfl: 3   Objective:     BP 138/78   Pulse 77   Temp 97.8 F (36.6 C)   Ht 5\' 8"  (1.727 m)   Wt 202 lb 9.6 oz (91.9 kg)   SpO2 91%   BMI 30.81 kg/m    Physical Exam Constitutional:      General: He is not in acute distress.    Appearance: Normal appearance. He is not ill-appearing, toxic-appearing or diaphoretic.  HENT:     Head: Normocephalic and atraumatic.     Right Ear: External ear normal.     Left Ear: External ear normal.     Mouth/Throat:     Mouth: Mucous membranes are moist.     Pharynx: Oropharynx is clear. No oropharyngeal exudate or posterior oropharyngeal erythema.  Eyes:     General: No scleral icterus.       Right eye: No discharge.        Left eye: No discharge.     Extraocular Movements: Extraocular movements intact.     Conjunctiva/sclera: Conjunctivae normal.     Pupils: Pupils are equal, round, and reactive to light.  Cardiovascular:     Rate and Rhythm: Normal rate and regular rhythm.  Pulmonary:     Effort: Pulmonary effort is normal. No respiratory distress.     Breath sounds: Normal breath sounds.  Abdominal:     General: Bowel sounds are normal.  Musculoskeletal:     Cervical back: No rigidity or tenderness.  Skin:    General: Skin is warm and dry.  Neurological:     Mental Status: He is alert and oriented to person, place, and time.  Psychiatric:        Mood and Affect: Mood normal.        Behavior: Behavior normal.      No results found for any visits on 09/21/23.    The ASCVD Risk score (Arnett DK, et al., 2019) failed to calculate for the following reasons:   The valid total cholesterol range is 130 to 320 mg/dL    Assessment & Plan:   Hospital discharge follow-up  Type 2 diabetes mellitus with both eyes affected by proliferative  retinopathy without macular edema, with long-term current use of insulin (HCC) -     Basic metabolic panel  Essential hypertension -     Basic metabolic panel    Return in about 3 months (around 12/22/2023).  Patient has follow-up with his endocrinologist, Dr. Horald Pollen on Monday.  Will  continue follow-up with neurology and cardiology.  Blood pressure is stable without amlodipine.  Advised him to continue his sobriety and abstinence from vaping.  Mliss Sax, MD

## 2023-09-29 ENCOUNTER — Other Ambulatory Visit: Payer: Self-pay | Admitting: Family Medicine

## 2023-09-29 DIAGNOSIS — I10 Essential (primary) hypertension: Secondary | ICD-10-CM

## 2023-10-02 ENCOUNTER — Ambulatory Visit: Payer: Self-pay

## 2023-10-02 NOTE — Patient Instructions (Signed)
Visit Information  Thank you for taking time to visit with me today. Please don't hesitate to contact me if I can be of assistance to you.   Following are the goals we discussed today:   Goals Addressed             This Visit's Progress    Diabetes and Parkinson's Management       Care Coordination Interventions: Evaluation of current treatment plan related to Parkinson's and patient's adherence to plan as established by provider Discussed plans with patient for ongoing care management follow up and provided patient with direct contact information for care management team Provided education to patient about basic DM disease process Reviewed medications with patient and discussed importance of medication adherence  Spoke with patient. He reports doing good.  PT to visit today.  He is managing his health but has a bulging disc that is giving him trouble. He reports therapy did not work.  Advised he follow up with physician to explore other options.  He verbalized understanding.  He states his sugar this am was 92.  Discussed low sugars and possible need to eat a small snack before bed.  He verbalized understanding.          Our next appointment is by telephone on 10/30/23 at 0930 am  Please call the care guide team at [phone] if you need to cancel or reschedule your appointment.   If you are experiencing a Mental Health or Behavioral Health Crisis or need someone to talk to, please call the Suicide and Crisis Lifeline: 988   The patient verbalized understanding of instructions, educational materials, and care plan provided today and DECLINED offer to receive copy of patient instructions, educational materials, and care plan.   The patient has been provided with contact information for the care management team and has been advised to call with any health related questions or concerns.   Bary Leriche, RN, MSN RN Care Manager [facility], Population  Health Direct Dial: [phone]  Fax: [phone] Website: [email or web address]

## 2023-10-02 NOTE — Patient Outreach (Signed)
  Care Coordination   Follow Up Visit Note   10/02/2023 Name: Jeffy Korch. MRN: [id number] DOB: [birth year]  [name]. is a 77 y.o. year old male who sees Mliss Sax, MD for primary care. I spoke with  [name]. by phone today.  What matters to the patients health and wellness today?  Bulging disc in back. He reports pain going down his right leg when up and moving.     Goals Addressed             This Visit's Progress    Diabetes and Parkinson's Management       Care Coordination Interventions: Evaluation of current treatment plan related to Parkinson's and patient's adherence to plan as established by provider Discussed plans with patient for ongoing care management follow up and provided patient with direct contact information for care management team Provided education to patient about basic DM disease process Reviewed medications with patient and discussed importance of medication adherence  Spoke with patient. He reports doing good.  PT to visit today.  He is managing his health but has a bulging disc that is giving him trouble. He reports therapy did not work.  Advised he follow up with physician to explore other options.  He verbalized understanding.  He states his sugar this am was 92.  Discussed low sugars and possible need to eat a small snack before bed.  He verbalized understanding.          SDOH assessments and interventions completed:  Yes     Care Coordination Interventions:  Yes, provided   Follow up plan: Follow up call scheduled for December    Encounter Outcome:  Patient Visit Completed   Bary Leriche, RN, MSN RN Care Manager [facility], Population Health Direct Dial: [phone]  Fax: [phone] Website: [email or web address]

## 2023-10-03 ENCOUNTER — Telehealth: Payer: Self-pay | Admitting: Family Medicine

## 2023-10-03 NOTE — Telephone Encounter (Signed)
Home Health verbal orders Caller Name:Wendy Agency Name: [facility]   Callback number: [phone] vm secure  Requesting OT/PT/Skilled nursing/Social Work/Speech: PT Home Health  Reason:weakness, safety  Frequency:  1x/4  Please forward to [facility] pool or providers CMA

## 2023-10-11 ENCOUNTER — Telehealth: Payer: Self-pay | Admitting: Family Medicine

## 2023-10-11 ENCOUNTER — Other Ambulatory Visit: Payer: Self-pay

## 2023-10-11 DIAGNOSIS — E113593 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, bilateral: Secondary | ICD-10-CM

## 2023-10-11 DIAGNOSIS — I10 Essential (primary) hypertension: Secondary | ICD-10-CM

## 2023-10-11 MED ORDER — LOSARTAN POTASSIUM 100 MG PO TABS: 100.0000 mg | ORAL_TABLET | Freq: Every day | ORAL | 1 refills | Status: DC

## 2023-10-11 NOTE — Telephone Encounter (Signed)
[facility] Maralyn Sago [phone]  Refill request  atorvastatin (LIPITOR) 40 MG tablet [[id number]]  losartan (COZAAR) 100 MG tablet [[id number]]   [id number] Ginette Otto, [place] - [address], [place] [address] Phone: [phone]  Fax: [phone]  Pt is out of these meds

## 2023-10-16 ENCOUNTER — Telehealth: Payer: Self-pay | Admitting: Family Medicine

## 2023-10-16 DIAGNOSIS — N390 Urinary tract infection, site not specified: Secondary | ICD-10-CM

## 2023-10-16 NOTE — Telephone Encounter (Signed)
Patient dropped off document Home Health Certificate (Order ID  ), to be filled out by provider. Patient requested to send it back via Fax within 7-days. Document is located in providers tray at front office.Please advise at Mobile [phone] (mobile)  Put in the dr box. Home health order through fax

## 2023-10-16 NOTE — Telephone Encounter (Signed)
HH ORDERS   Caller Name: Florentina Addison, RN Home Health Agency Name: [facility] Callback Phone #: [phone], has secure VM  Service Requested: VO for urine sample to be collected tomorrow morning (10/17/2023). Pt has complained of burning with urination x 3-4 days.

## 2023-10-23 DIAGNOSIS — I951 Orthostatic hypotension: Secondary | ICD-10-CM

## 2023-10-23 DIAGNOSIS — I442 Atrioventricular block, complete: Secondary | ICD-10-CM

## 2023-10-23 DIAGNOSIS — I10 Essential (primary) hypertension: Secondary | ICD-10-CM

## 2023-10-23 DIAGNOSIS — G20A1 Parkinson's disease without dyskinesia, without mention of fluctuations: Secondary | ICD-10-CM

## 2023-10-23 DIAGNOSIS — Z794 Long term (current) use of insulin: Secondary | ICD-10-CM

## 2023-10-23 DIAGNOSIS — J449 Chronic obstructive pulmonary disease, unspecified: Secondary | ICD-10-CM | POA: Diagnosis not present

## 2023-10-23 DIAGNOSIS — E669 Obesity, unspecified: Secondary | ICD-10-CM

## 2023-10-23 DIAGNOSIS — J690 Pneumonitis due to inhalation of food and vomit: Secondary | ICD-10-CM | POA: Diagnosis not present

## 2023-10-23 DIAGNOSIS — N401 Enlarged prostate with lower urinary tract symptoms: Secondary | ICD-10-CM

## 2023-10-23 DIAGNOSIS — E11311 Type 2 diabetes mellitus with unspecified diabetic retinopathy with macular edema: Secondary | ICD-10-CM | POA: Diagnosis not present

## 2023-10-23 DIAGNOSIS — K219 Gastro-esophageal reflux disease without esophagitis: Secondary | ICD-10-CM

## 2023-10-23 DIAGNOSIS — Z48812 Encounter for surgical aftercare following surgery on the circulatory system: Secondary | ICD-10-CM | POA: Diagnosis not present

## 2023-10-23 MED ORDER — NITROFURANTOIN MONOHYD MACRO 100 MG PO CAPS
100.0000 mg | ORAL_CAPSULE | Freq: Two times a day (BID) | ORAL | 0 refills | Status: AC
Start: 2023-10-23 — End: 2023-10-30

## 2023-10-23 NOTE — Addendum Note (Signed)
Addended by: Andrez Grime on: 10/23/2023 05:08 PM   Modules accepted: Orders

## 2023-10-23 NOTE — Telephone Encounter (Signed)
Karl Lawson calling stating an infection was found. Lab report in Media dated 10/20/23.

## 2023-10-24 ENCOUNTER — Telehealth: Payer: Self-pay | Admitting: Family Medicine

## 2023-10-24 NOTE — Telephone Encounter (Signed)
Patient dropped off document Home Health Certificate (Order ID  ), to be filled out by provider. Patient requested to send it back via Fax within 7-days. Document is located in providers tray at front office.Please advise at Mobile [phone] (mobile)   Hoe health order came by fax. I put in the dr box

## 2023-10-25 NOTE — Telephone Encounter (Signed)
HH Certification and Plan of Care  CLINICAL USE BELOW THIS LINE (use X to signify action taken)  ___ Form received and placed in providers office for signature. _X__ Form completed and faxed to [facility] at [phone]. ___ Form completed & LVM to notify patient ready for pick up.  _X__ Charge sheet and copy of form in front office folder for office supervisor.

## 2023-10-25 NOTE — Telephone Encounter (Signed)
CLINICAL USE BELOW THIS LINE (use X to signify action taken)  ___ Form received and placed in providers office for signature. __X_ Form completed and faxed to [facility] - Verbal orders.  ___ Form completed & LVM to notify patient ready for pick up.  ___ Charge sheet and copy of form in front office folder for office supervisor.

## 2023-10-27 ENCOUNTER — Encounter: Payer: Self-pay | Admitting: Neurology

## 2023-10-30 ENCOUNTER — Ambulatory Visit: Payer: Self-pay

## 2023-10-30 NOTE — Patient Outreach (Signed)
  Care Coordination   Follow Up Visit Note   10/30/2023 Name: Karl Lawson. MRN: [id number] DOB: 05/17/1946  Karl Lawson. is a 77 y.o. year old male who sees Mliss Sax, MD for primary care. I spoke with  Karl Lawson. by phone today.  What matters to the patients health and wellness today?  Maintain health    Goals Addressed             This Visit's Progress    Diabetes and Parkinson's Management       Care Coordination Interventions: Evaluation of current treatment plan related to Parkinson's and patient's adherence to plan as established by provider Discussed plans with patient for ongoing care management follow up and provided patient with direct contact information for care management team Provided education to patient about basic DM disease process Reviewed medications with patient and discussed importance of medication adherence  Spoke with patient. He reports doing okay.  Home health continues to visit.  Currently being treated for UTI/reports symptoms better.  He reports still some problems with bulging disc that is giving him trouble. He states he will follow up with physician to explore other options.  He states his sugar last was 150 after he ate and took his insulin.  Discussed continued diabetes management.  He verbalized understanding.          SDOH assessments and interventions completed:  Yes     Care Coordination Interventions:  Yes, provided   Follow up plan: Follow up call scheduled for January    Encounter Outcome:  Patient Visit Completed   Bary Leriche, RN, MSN RN Care Manager [facility], Population Health Direct Dial: [phone]  Fax: [phone] Website: [email or web address]

## 2023-10-30 NOTE — Patient Instructions (Signed)
Visit Information  Thank you for taking time to visit with me today. Please don't hesitate to contact me if I can be of assistance to you.   Following are the goals we discussed today:   Goals Addressed             This Visit's Progress    Diabetes and Parkinson's Management       Care Coordination Interventions: Evaluation of current treatment plan related to Parkinson's and patient's adherence to plan as established by provider Discussed plans with patient for ongoing care management follow up and provided patient with direct contact information for care management team Provided education to patient about basic DM disease process Reviewed medications with patient and discussed importance of medication adherence  Spoke with patient. He reports doing okay.  Home health continues to visit.  Currently being treated for UTI/reports symptoms better.  He reports still some problems with bulging disc that is giving him trouble. He states he will follow up with physician to explore other options.  He states his sugar last was 150 after he ate and took his insulin.  Discussed continued diabetes management.  He verbalized understanding.          Our next appointment is by telephone on 12/04/23 at 0930  Please call the care guide team at [phone] if you need to cancel or reschedule your appointment.   If you are experiencing a Mental Health or Behavioral Health Crisis or need someone to talk to, please call the Suicide and Crisis Lifeline: 988   Patient verbalizes understanding of instructions and care plan provided today and agrees to view in MyChart. Active MyChart status and patient understanding of how to access instructions and care plan via MyChart confirmed with patient.     The patient has been provided with contact information for the care management team and has been advised to call with any health related questions or concerns.   Bary Leriche, RN, MSN RN Care Manager [facility], Population Health Direct Dial: [phone]  Fax: [phone] Website: [email or web address]

## 2023-11-10 NOTE — Telephone Encounter (Signed)
 HH Orders to be signedRec'd 11/07/2023 CLINICAL USE BELOW THIS LINE (use X to signify action taken)  __X_ Form received and placed in providers office for signature. ___ Form completed and faxed to [facility] Dept.  ___ Form completed & LVM to notify patient ready for pick up.  ___ Charge sheet and copy of form in front office folder for office supervisor.

## 2023-11-13 ENCOUNTER — Ambulatory Visit: Payer: Self-pay | Admitting: Family Medicine

## 2023-11-13 NOTE — Telephone Encounter (Signed)
  Chief Complaint: Left leg pain/weakness Symptoms: Left leg pain, weakness Frequency: Ongoing since Friday Pertinent Negatives: Patient denies Swelling, redness, numbness/tingling Disposition: [] ED /[] Urgent Care (no appt availability in office) / [x] Appointment(In office/virtual)/ []  [facility]/ [] Home Care/ [] Refused Recommended Disposition /[] [facility]/ []  Follow-up with PCP Additional Notes: Pt reports he noticed some left leg, groin pain and weakness on Friday, pt reports several falls over the weekend, one in which he hit his head but denies HA, LOC, N/V, did not seek eval/treat following any of the falls. Pt reports ongoing pain at 4/10 at rest, with increased pain with weight bearing to left leg. Scheduled for OV 01/08, this RN educated pt on when to call back/seek emergency care, educated on home care. Pt verbalized understanding and agrees to plan.   Copied from CRM [id number]. Topic: Clinical - Red Word Triage >> Nov 13, 2023 11:07 AM Viola FALCON wrote: Red Word that prompted transfer to Nurse Triage: Patient had a fall over the weekend - fell on his butt - having pain in left leg Reason for Disposition  [1] MODERATE pain (e.g., interferes with normal activities, limping) AND [2] present > 3 days  Answer Assessment - Initial Assessment Questions 1. ONSET: When did the pain start?      Began Friday 2. LOCATION: Where is the pain located?      Left groin/leg pain 3. PAIN: How bad is the pain?    (Scale 1-10; or mild, moderate, severe)   -  MILD (1-3): doesn't interfere with normal activities    -  MODERATE (4-7): interferes with normal activities (e.g., work or school) or awakens from sleep, limping    -  SEVERE (8-10): excruciating pain, unable to do any normal activities, unable to walk     4/10 sitting, worse when bears weight 4. WORK OR EXERCISE: Has there been any recent work or exercise that involved this part of the body?      None 5. CAUSE:  What do you think is causing the leg pain?     Several falls over the weekend 6. OTHER SYMPTOMS: Do you have any other symptoms? (e.g., chest pain, back pain, breathing difficulty, swelling, rash, fever, numbness, weakness)     Numbness in bilateral feet (chronic), weakness in left leg  Protocols used: Leg Pain-A-AH

## 2023-11-15 ENCOUNTER — Ambulatory Visit: Payer: Medicare Other | Admitting: Internal Medicine

## 2023-11-15 ENCOUNTER — Encounter: Payer: Self-pay | Admitting: Internal Medicine

## 2023-11-15 VITALS — BP 100/60 | HR 89 | Temp 98.0°F | Ht 68.0 in | Wt 200.8 lb

## 2023-11-15 DIAGNOSIS — R443 Hallucinations, unspecified: Secondary | ICD-10-CM

## 2023-11-15 DIAGNOSIS — R29898 Other symptoms and signs involving the musculoskeletal system: Secondary | ICD-10-CM

## 2023-11-15 DIAGNOSIS — G20A2 Parkinson's disease without dyskinesia, with fluctuations: Secondary | ICD-10-CM

## 2023-11-15 NOTE — Progress Notes (Signed)
 [facility]IMARY [facility]ARE [facility] [address] [place] [address] Dept: [phone] Dept Fax: [phone]  Acute Care Office Visit  Subjective:   Karl Lawson. 20-Jan-1946 11/15/2023  Chief Complaint  Patient presents with   Gait Problem    Unsteady on feet    Hallucinations    At night started a couple weeks ago     HPI: Discussed the use of AI scribe software for clinical note transcription with the patient, who gave verbal consent to proceed.  History of Present Illness   The patient, with a history of Parkinson's disease, T2DM, AV block, COPD, presents with worsening bilateral leg weakness and falls over the past week. He reports a sensation of his knees buckling, leading to several falls, one of which resulted in a collision with a wall. He denies loss of consciousness during these episodes. He also reports neuropathy in his feet, characterized by numbness, tingling, and coldness.  In addition to these symptoms, he has been experiencing hallucinations, seeing his wife in bed with him despite her being in a nursing home. These hallucinations have occurred two to three times over several weeks. Sister-in-law also reports patient having some confusion and slower response times.   His diabetes appears to be well-controlled, with morning blood sugars around 100-110.   He reports occasional palpitations, which he attributes to his pacemaker. He is due for a pacemaker check with his cardiologist.  He has been taking carbidopa -levodopa  for his Parkinson's disease. He is managed by neurology.   He denies any recent fever, chills, chest pain, shortness of breath, cough, N/V/D, edema in extremities.   He also reports a history of urinary tract infections (UTIs). He saw his urologist today (11/14/22) Dr. Shane with [facility]. Sister-in-law reports urinalysis did show white blood cells, MD was sending urine sample off for culture to  determine if UTI is present.   Patient uses a rollator for gait assistance, they received this from a friend.  However the sister-in-law reports that the brakes do not work.  Sister-in-law is requesting a rollator for the patient.      The following portions of the patient's history were reviewed and updated as appropriate: past medical history, past surgical history, family history, social history, allergies, medications, and problem list.   Patient Active Problem List   Diagnosis Date Noted   AV block, 3rd degree (HCC) 08/18/2023   Acute delirium 08/17/2023   Acute metabolic encephalopathy 08/17/2023   Cellulitis of left elbow 06/13/2023   Benign prostatic hyperplasia 06/13/2023   Urge incontinence of urine 06/13/2023   Dysthymia 02/07/2023   Sciatica of right side 02/07/2023   Parkinson's disease (HCC) 07/27/2022   Abnormal nuclear stress test    Hyperglycemia due to type 2 diabetes mellitus (HCC) 02/07/2022   Long term (current) use of insulin  (HCC) 02/07/2022   Obesity 02/07/2022   Lower respiratory infection 02/07/2022   Reactive airway disease 02/07/2022   Unilateral inguinal hernia without obstruction or gangrene 12/13/2021   Medication side effect 01/25/2021   Balanitis 01/25/2021   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, bilateral (HCC) 07/29/2019   COPD (chronic obstructive pulmonary disease) (HCC) 12/15/2016   Skin tag 12/15/2016   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   ABNORMAL ELECTROCARDIOGRAM 10/22/2010   OTHER HEART BLOCK 10/14/2010   GERD 05/22/2009   Mixed hyperlipidemia 07/17/2008   Essential hypertension 07/17/2008   History of adenomatous polyps of colon - probable attenuated  polyposis 07/17/2008   ELEVATED PROSTATE SPECIFIC ANTIGEN 07/06/2007   Past Medical History:  Diagnosis Date   CAP (community acquired pneumonia) 1968   [place] ([employer])   Cataract    COPD (chronic obstructive  pulmonary disease) (HCC)    Diabetes mellitus    Diverticulosis of colon 11/19/2012   left colon, Dr Avram   GERD (gastroesophageal reflux disease)    History of adenomatous polyps of colon - probable attenuated polyposis 07/17/2008   2004: 2 polyps max 8 mm 1 lost and 1 infammatory 2010: 7 polyps TV and tubular adenomas max 6 mm 11/19/2012 :4 polyps; max 1 cm removed; all adenomas 01/05/2017 8 polyps max 10 mm adenomas recall 2021     Hyperlipidemia    Hypertension    Lumbago    buldging disc L4-5   Personal history of adenomatous colonic polyps 07/17/2008   2004 2 polyps max 8 mm (adenomas) 2010 7 polyps TV and tubular adenomas max 6 mm 11/19/2012     Past Surgical History:  Procedure Laterality Date   APPENDECTOMY  1959   CATARACT EXTRACTION Right    COLONOSCOPY  multiple   adenomatous polyps, Dr Avram   ESOPHAGOGASTRODUODENOSCOPY  multiple   INGUINAL HERNIA REPAIR Left 1997   INGUINAL HERNIA REPAIR Bilateral 10/24/2022   Procedure: LAPAROSCOPIC BILATERAL INGUINAL HERNIA REPAIR WITH MESH;  Surgeon: Rubin Calamity, MD;  Location: [facility] OR;  Service: General;  Laterality: Bilateral;   LEFT HEART CATH AND CORONARY ANGIOGRAPHY N/A 05/24/2022   Procedure: LEFT HEART CATH AND CORONARY ANGIOGRAPHY;  Surgeon: Dann Candyce RAMAN, MD;  Location: [facility] INVASIVE CV LAB;  Service: Cardiovascular;  Laterality: N/A;   PACEMAKER IMPLANT N/A 08/24/2023   Procedure: PACEMAKER IMPLANT;  Surgeon: Waddell Danelle ORN, MD;  Location: [facility] INVAS[facility] CV LAB;  Service: Cardiovascular;  Laterality: N/A;   TONSILLECTOMY     Family History  Problem Relation Age of Onset   Alzheimer's disease Mother    Heart disease Father        CHF   Stroke Father 39   Tremor Father    Tremor Sister    Pulmonary embolism Brother    Diabetes Brother    Heart disease Paternal Grandfather    Asthma Neg Hx    Colon cancer Neg Hx    Esophageal cancer Neg Hx    Pancreatic cancer Neg Hx    Prostate cancer Neg Hx    Rectal  cancer Neg Hx    Stomach cancer Neg Hx     Current Outpatient Medications:    acetaminophen  (TYLENOL ) 500 MG tablet, Take 500 mg by mouth every 6 (six) hours as needed for moderate pain., Disp: , Rfl:    albuterol  (VENTOLIN  HFA) 108 (90 Base) MCG/ACT inhaler, Inhale 2 puffs into the lungs every 6 (six) hours as needed for wheezing or shortness of breath., Disp: , Rfl:    aspirin  EC 81 MG tablet, Take 81 mg by mouth daily., Disp: , Rfl:    atorvastatin  (LIPITOR) 40 MG tablet, Take 1 tablet (40 mg total) by mouth daily., Disp: , Rfl:    carbidopa -levodopa  (SINEMET  IR) 25-100 MG tablet, TAKE 1.5 TABLETS BY MOUTH 3 (THREE) TIMES DAILY. 6AM/10-11AM/3-4PM, Disp: 405 tablet, Rfl: 0   chlorthalidone  (HYGROTON ) 25 MG tablet, TAKE 1 TABLET (25 MG TOTAL) BY MOUTH DAILY., Disp: 90 tablet, Rfl: 0   dorzolamide-timolol (COSOPT) 2-0.5 % ophthalmic solution, Place 1 drop into the left eye 2 (two) times daily., Disp: , Rfl:    finasteride  (  PROSCAR ) 5 MG tablet, Take 1 tablet (5 mg total) by mouth daily., Disp: 90 tablet, Rfl: 3   Glucose 15 g PACK, Take 15 g by mouth as needed (hypoglycemia)., Disp: , Rfl:    insulin  aspart (NOVOLOG ) 100 UNIT/ML injection, Inject 0-9 Units into the skin 3 (three) times daily with meals. CBG < 70:treat low blood sugar CBG 70 - 120: 0 units  CBG 121 - 150: 1 unit  CBG 151 - 200: 2 units  CBG 201 - 250: 3 units  CBG 251 - 300: 5 units  CBG 301 - 350: 7 units  CBG 351 - 400: 9 units  CBG > 400: call MD, Disp: , Rfl:    JARDIANCE  10 MG TABS tablet, Take 10 mg by mouth daily., Disp: , Rfl:    losartan  (COZAAR ) 100 MG tablet, Take 1 tablet (100 mg total) by mouth daily., Disp: 90 tablet, Rfl: 1   metFORMIN  (GLUCOPHAGE ) 1000 MG tablet, Take 1 tablet (1,000 mg total) by mouth 2 (two) times daily with a meal., Disp: 60 tablet, Rfl: 0   omeprazole  (PRILOSEC) 40 MG capsule, Take 1 capsule (40 mg total) by mouth daily., Disp: 90 capsule, Rfl: 3   Metamucil Fiber CHEW, Chew 1 each by mouth  daily., Disp: , Rfl:    tobramycin (TOBREX) 0.3 % ophthalmic solution, 1 drop every 4 (four) hours., Disp: , Rfl:    traMADol  (ULTRAM ) 50 MG tablet, Take 50 mg by mouth every 6 (six) hours as needed (pain). (Patient not taking: Reported on 11/15/2023), Disp: , Rfl:  No Known Allergies   ROS: A complete ROS was performed with pertinent positives/negatives noted in the HPI. The remainder of the ROS are negative.    Objective:   Today's Vitals   11/15/23 1401 11/15/23 1455  BP: (!) 92/56 100/60  Pulse: 89   Temp: 98 F (36.7 C)   TempSrc: Temporal   SpO2: 96%   Weight: 200 lb 12.8 oz (91.1 kg)   Height: 5' 8 (1.727 m)     GENERAL: Well-appearing, in NAD. Well nourished.  SKIN: Pink, warm and dry. No rash.  NECK: Trachea midline. Full ROM w/o pain or tenderness. No lymphadenopathy.  RESPIRATORY: Chest wall symmetrical. Respirations even and non-labored. Breath sounds clear to auscultation bilaterally.  CARDIAC: S1, S2 present, regular rate and rhythm. Peripheral pulses 2+ bilaterally.  EXTREMITIES: Without clubbing, cyanosis, or edema.  PSYCH/MENTAL STATUS: Alert, oriented x 3. Cooperative, appropriate mood and affect.    No results found for any visits on 11/15/23.    Assessment & Plan:  Assessment and Plan    Lower extremity weakness and falls New onset over the past week, with multiple falls but no loss of consciousness. Legs give out, causing falls. No associated chest pain, shortness of breath, or fever. -Order CBC, CMP, and magnesium level to evaluate for possible electrolyte imbalances or other systemic causes. -referral for a walker with brakes for improved mobility and safety.  Hallucinations - obtain CBC, CMP, Magnesium  - Called [facility], they are sending office notes and urine results . They did verify that WBC's present in urine and MD was sending for urine culture.  - if negative results, could be related to parkinson's and patient would then need to  follow up with neurology for further medication adjustments.    Orders Placed This Encounter  Procedures   CBC with Differential/Platelet   Comp Met (CMET)   Magnesium   Lab Orders  CBC with Differential/Platelet         Comp Met (CMET)         Magnesium     No images are attached to the encounter or orders placed in the encounter.  Return for Scheduled Routine Office Visits and as needed.   Rosina Senters, FNP

## 2023-11-15 NOTE — Patient Instructions (Signed)
 I will be in contact with lab results

## 2023-11-16 ENCOUNTER — Other Ambulatory Visit: Payer: Self-pay

## 2023-11-16 ENCOUNTER — Encounter (HOSPITAL_BASED_OUTPATIENT_CLINIC_OR_DEPARTMENT_OTHER): Payer: Self-pay | Admitting: Emergency Medicine

## 2023-11-16 ENCOUNTER — Emergency Department (HOSPITAL_BASED_OUTPATIENT_CLINIC_OR_DEPARTMENT_OTHER)
Admission: EM | Admit: 2023-11-16 | Discharge: 2023-11-16 | Disposition: A | Discharge status: Home / Self Care | Payer: Medicare Other | Attending: Emergency Medicine | Admitting: Emergency Medicine

## 2023-11-16 ENCOUNTER — Ambulatory Visit (HOSPITAL_COMMUNITY)
Admission: EM | Admit: 2023-11-16 | Discharge: 2023-11-16 | Disposition: A | Discharge status: Home / Self Care | Payer: Medicare Other

## 2023-11-16 ENCOUNTER — Emergency Department (HOSPITAL_BASED_OUTPATIENT_CLINIC_OR_DEPARTMENT_OTHER): Payer: Medicare Other

## 2023-11-16 DIAGNOSIS — Z7984 Long term (current) use of oral hypoglycemic drugs: Secondary | ICD-10-CM | POA: Insufficient documentation

## 2023-11-16 DIAGNOSIS — G20A1 Parkinson's disease without dyskinesia, without mention of fluctuations: Secondary | ICD-10-CM | POA: Diagnosis not present

## 2023-11-16 DIAGNOSIS — Z7982 Long term (current) use of aspirin: Secondary | ICD-10-CM | POA: Insufficient documentation

## 2023-11-16 DIAGNOSIS — I10 Essential (primary) hypertension: Secondary | ICD-10-CM | POA: Insufficient documentation

## 2023-11-16 DIAGNOSIS — R42 Dizziness and giddiness: Secondary | ICD-10-CM | POA: Insufficient documentation

## 2023-11-16 DIAGNOSIS — J45909 Unspecified asthma, uncomplicated: Secondary | ICD-10-CM | POA: Insufficient documentation

## 2023-11-16 DIAGNOSIS — Z87891 Personal history of nicotine dependence: Secondary | ICD-10-CM | POA: Diagnosis not present

## 2023-11-16 DIAGNOSIS — Z79899 Other long term (current) drug therapy: Secondary | ICD-10-CM | POA: Diagnosis not present

## 2023-11-16 DIAGNOSIS — Z950 Presence of cardiac pacemaker: Secondary | ICD-10-CM | POA: Insufficient documentation

## 2023-11-16 DIAGNOSIS — J449 Chronic obstructive pulmonary disease, unspecified: Secondary | ICD-10-CM | POA: Diagnosis not present

## 2023-11-16 DIAGNOSIS — Z955 Presence of coronary angioplasty implant and graft: Secondary | ICD-10-CM | POA: Diagnosis not present

## 2023-11-16 DIAGNOSIS — N179 Acute kidney failure, unspecified: Secondary | ICD-10-CM | POA: Diagnosis not present

## 2023-11-16 DIAGNOSIS — E11319 Type 2 diabetes mellitus with unspecified diabetic retinopathy without macular edema: Secondary | ICD-10-CM | POA: Insufficient documentation

## 2023-11-16 DIAGNOSIS — Z794 Long term (current) use of insulin: Secondary | ICD-10-CM | POA: Insufficient documentation

## 2023-11-16 DIAGNOSIS — R531 Weakness: Secondary | ICD-10-CM | POA: Insufficient documentation

## 2023-11-16 DIAGNOSIS — R309 Painful micturition, unspecified: Secondary | ICD-10-CM | POA: Diagnosis present

## 2023-11-16 LAB — CBC WITH DIFFERENTIAL/PLATELET
Basophils Absolute: 0.1 10*3/uL (ref 0.0–0.1)
Basophils Relative: 0.8 % (ref 0.0–3.0)
Eosinophils Absolute: 0.2 10*3/uL (ref 0.0–0.7)
Eosinophils Relative: 1.8 % (ref 0.0–5.0)
HCT: 42.4 % (ref 39.0–52.0)
Hemoglobin: 13.8 g/dL (ref 13.0–17.0)
Lymphocytes Relative: 22.9 % (ref 12.0–46.0)
Lymphs Abs: 2 10*3/uL (ref 0.7–4.0)
MCHC: 32.6 g/dL (ref 30.0–36.0)
MCV: 82.8 fL (ref 78.0–100.0)
Monocytes Absolute: 1.3 10*3/uL — ABNORMAL HIGH (ref 0.1–1.0)
Monocytes Relative: 14.9 % — ABNORMAL HIGH (ref 3.0–12.0)
Neutro Abs: 5.3 10*3/uL (ref 1.4–7.7)
Neutrophils Relative %: 59.6 % (ref 43.0–77.0)
Platelets: 311 10*3/uL (ref 150.0–400.0)
RBC: 5.12 Mil/uL (ref 4.22–5.81)
RDW: 17.4 % — ABNORMAL HIGH (ref 11.5–15.5)
WBC: 8.9 10*3/uL (ref 4.0–10.5)

## 2023-11-16 LAB — URINALYSIS, ROUTINE W REFLEX MICROSCOPIC
Bilirubin Urine: NEGATIVE
Glucose, UA: >1000 mg/dL — AB
Hgb urine dipstick: NEGATIVE
Ketones, ur: NEGATIVE mg/dL
Nitrite: NEGATIVE
Protein, ur: NEGATIVE mg/dL
Specific Gravity, Urine: 1.012 (ref 1.005–1.030)
pH: 5.5 (ref 5.0–8.0)

## 2023-11-16 LAB — COMPREHENSIVE METABOLIC PANEL
ALT: 6 U/L (ref 0–53)
AST: 19 U/L (ref 0–37)
Albumin: 4.3 g/dL (ref 3.5–5.2)
Alkaline Phosphatase: 94 U/L (ref 39–117)
BUN: 28 mg/dL — ABNORMAL HIGH (ref 6–23)
CO2: 26 meq/L (ref 19–32)
Calcium: 10.8 mg/dL — ABNORMAL HIGH (ref 8.4–10.5)
Chloride: 95 meq/L — ABNORMAL LOW (ref 96–112)
Creatinine, Ser: 1.46 mg/dL (ref 0.40–1.50)
GFR: 45.9 mL/min — ABNORMAL LOW (ref >60.00)
Glucose, Bld: 187 mg/dL — ABNORMAL HIGH (ref 70–99)
Potassium: 4 meq/L (ref 3.5–5.1)
Sodium: 135 meq/L (ref 135–145)
Total Bilirubin: 0.7 mg/dL (ref 0.2–1.2)
Total Protein: 7.2 g/dL (ref 6.0–8.3)

## 2023-11-16 LAB — CBC
HCT: 43.3 % (ref 39.0–52.0)
Hemoglobin: 14.1 g/dL (ref 13.0–17.0)
MCH: 26.3 pg (ref 26.0–34.0)
MCHC: 32.6 g/dL (ref 30.0–36.0)
MCV: 80.8 fL (ref 80.0–100.0)
Platelets: 272 10*3/uL (ref 150–400)
RBC: 5.36 MIL/uL (ref 4.22–5.81)
RDW: 16.2 % — ABNORMAL HIGH (ref 11.5–15.5)
WBC: 8.9 10*3/uL (ref 4.0–10.5)
nRBC: 0 % (ref 0.0–0.2)

## 2023-11-16 LAB — BASIC METABOLIC PANEL
Anion gap: 11 (ref 5–15)
BUN: 36 mg/dL — ABNORMAL HIGH (ref 8–23)
CO2: 26 mmol/L (ref 22–32)
Calcium: 10.4 mg/dL — ABNORMAL HIGH (ref 8.9–10.3)
Chloride: 97 mmol/L — ABNORMAL LOW (ref 98–111)
Creatinine, Ser: 1.59 mg/dL — ABNORMAL HIGH (ref 0.61–1.24)
GFR, Estimated: 44 mL/min — ABNORMAL LOW (ref >60)
Glucose, Bld: 122 mg/dL — ABNORMAL HIGH (ref 70–99)
Potassium: 3.8 mmol/L (ref 3.5–5.1)
Sodium: 134 mmol/L — ABNORMAL LOW (ref 135–145)

## 2023-11-16 LAB — MAGNESIUM: Magnesium: 1.5 mg/dL (ref 1.5–2.5)

## 2023-11-16 MED ORDER — MISC. DEVICES MISC: 0 refills | Status: AC

## 2023-11-16 MED ORDER — SODIUM CHLORIDE 0.9 % IV BOLUS
1000.0000 mL | Freq: Once | INTRAVENOUS | Status: AC
  Administered 2023-11-16: 1000 mL via INTRAVENOUS

## 2023-11-16 MED ORDER — SODIUM CHLORIDE 0.9 % IV BOLUS
500.0000 mL | Freq: Once | INTRAVENOUS | Status: AC
  Administered 2023-11-16: 500 mL via INTRAVENOUS

## 2023-11-16 NOTE — ED Triage Notes (Signed)
 Pt had a pacemaker placed recently. When he was dc home he was still dizzy at times, primary did blood work and said it will pass. Pt states he has been feeling confused for about a weak and a half and dizzy intermittent. Pt states he has a hard time answering questions at times, pt is here with sister in law.

## 2023-11-16 NOTE — Telephone Encounter (Signed)
 Patient's son was contacted and informed of results  Patient is being seen at [facility] Copied from CRM [id number]. Topic: Clinical - Lab/Test Results >> Nov 16, 2023  3:12 PM Tiffany H wrote: Reason for CRM: Patient's son called to advise of recent lab results advising patient to go to ED. See most recent lab results.

## 2023-11-16 NOTE — Discharge Instructions (Addendum)
 While you are in the emergency room, you had a CT scan done of your head that was normal.  Your blood work done which shows that your kidney function is getting little bit worse.  I would like you to follow-up with your regular doctor within 1 week to have repeat labs done.  Make sure that you are drinking plenty of water over the next week.  Return emergency department if you develop increased weakness, fever, difficulty with your breathing, or increased difficulty with walking.

## 2023-11-16 NOTE — ED Provider Notes (Signed)
 [place] EMERGENCY DEPARTMENT AT [facility] Provider Note  CSN: [id number] Arrival date & time: 11/16/23 1647  Chief Complaint(s) No chief complaint on file.  HPI Karl Lawson. is a 78 y.o. male who is here today for weakness, and lightheadedness upon standing.  Patient with a past medical history significant for Parkinson's on carbidopa  levodopa , pacemaker implantation in October.  He says that he has had some burning with urination, and is felt intermittent weakness in both of his legs, typically 1 and then the other.  He saw his urologist yesterday who did a urinalysis and is sending for culture.   Past Medical History Past Medical History:  Diagnosis Date   CAP (community acquired pneumonia) 1968   [place] ([employer])   Cataract    COPD (chronic obstructive pulmonary disease) (HCC)    Diabetes mellitus    Diverticulosis of colon 11/19/2012   left colon, Dr Avram   GERD (gastroesophageal reflux disease)    History of adenomatous polyps of colon - probable attenuated polyposis 07/17/2008   2004: 2 polyps max 8 mm 1 lost and 1 infammatory 2010: 7 polyps TV and tubular adenomas max 6 mm 11/19/2012 :4 polyps; max 1 cm removed; all adenomas 01/05/2017 8 polyps max 10 mm adenomas recall 2021     Hyperlipidemia    Hypertension    Lumbago    buldging disc L4-5   Personal history of adenomatous colonic polyps 07/17/2008   2004 2 polyps max 8 mm (adenomas) 2010 7 polyps TV and tubular adenomas max 6 mm 11/19/2012     Patient Active Problem List   Diagnosis Date Noted   AV block, 3rd degree (HCC) 08/18/2023   Acute delirium 08/17/2023   Acute metabolic encephalopathy 08/17/2023   Cellulitis of left elbow 06/13/2023   Benign prostatic hyperplasia 06/13/2023   Urge incontinence of urine 06/13/2023   Dysthymia 02/07/2023   Sciatica of right side 02/07/2023   Parkinson's disease (HCC) 07/27/2022   Abnormal nuclear stress test    Hyperglycemia due to type 2 diabetes  mellitus (HCC) 02/07/2022   Long term (current) use of insulin  (HCC) 02/07/2022   Obesity 02/07/2022   Lower respiratory infection 02/07/2022   Reactive airway disease 02/07/2022   Unilateral inguinal hernia without obstruction or gangrene 12/13/2021   Medication side effect 01/25/2021   Balanitis 01/25/2021   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, bilateral (HCC) 07/29/2019   COPD (chronic obstructive pulmonary disease) (HCC) 12/15/2016   Skin tag 12/15/2016   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 Authorizing Provider  acetaminophen  (TYLENOL ) 500 MG tablet Take 500 mg by mouth every 6 (six) hours as needed for moderate pain.    [provider]  albuterol  (VENTOLIN  HFA) 108 (90 Base) MCG/ACT inhaler Inhale 2 puffs into the lungs every 6 (six) hours as needed for wheezing or shortness of breath.    [provider]  aspirin  EC 81 MG tablet Take 81 mg by mouth daily.    [provider]  atorvastatin  (LIPITOR) 40 MG tablet Take 1 tablet (40 mg total) by mouth daily. 08/26/23  Perri DELENA Meliton Mickey., MD  carbidopa -levodopa  (SINEMET  IR) 25-100 MG tablet TAKE 1.5 TABLETS BY MOUTH 3 (THREE) TIMES DAILY. 6AM/10-11AM/3-4PM 08/16/23   Tat, Asberry RAMAN, DO  chlorthalidone  (HYGROTON ) 25 MG tablet TAKE 1 TABLET (25 MG TOTAL) BY MOUTH DAILY. 09/29/23   Berneta Elsie Sayre, MD  dorzolamide-timolol (COSOPT) 2-0.5 % ophthalmic solution Place 1 drop into the left eye 2 (two) times daily.    [provider]  finasteride   (PROSCAR ) 5 MG tablet Take 1 tablet (5 mg total) by mouth daily. 05/10/23   Berneta Elsie Sayre, MD  Glucose 15 g PACK Take 15 g by mouth as needed (hypoglycemia).    [provider]  insulin  aspart (NOVOLOG ) 100 UNIT/ML injection Inject 0-9 Units into the skin 3 (three) times daily with meals. CBG < 70:treat low blood sugar CBG 70 - 120: 0 units  CBG 121 - 150: 1 unit  CBG 151 - 200: 2 units  CBG 201 - 250: 3 units  CBG 251 - 300: 5 units  CBG 301 - 350: 7 units  CBG 351 - 400: 9 units  CBG > 400: call MD 08/25/23   Perri DELENA Meliton Mickey., MD  JARDIANCE  10 MG TABS tablet Take 10 mg by mouth daily. 04/03/20   [provider]  losartan  (COZAAR ) 100 MG tablet Take 1 tablet (100 mg total) by mouth daily. 10/11/23   Berneta Elsie Sayre, MD  Metamucil Fiber CHEW Chew 1 each by mouth daily.    [provider]  metFORMIN  (GLUCOPHAGE ) 1000 MG tablet Take 1 tablet (1,000 mg total) by mouth 2 (two) times daily with a meal. 05/26/22   Dann Candyce RAMAN, MD  Misc. Devices MISC Rollator. G20.A2 11/16/23   Billy Knee, FNP  omeprazole  (PRILOSEC) 40 MG capsule Take 1 capsule (40 mg total) by mouth daily. 05/10/23   Berneta Elsie Sayre, MD  tobramycin (TOBREX) 0.3 % ophthalmic solution 1 drop every 4 (four) hours.    [provider]  traMADol  (ULTRAM ) 50 MG tablet Take 50 mg by mouth every 6 (six) hours as needed (pain). Patient not taking: Reported on 11/15/2023    [provider]                                                                                                                                    Past Surgical History Past Surgical History:  Procedure Laterality Date   APPENDECTOMY  1959   CATARACT EXTRACTION Right    COLONOSCOPY  multiple   adenomatous polyps, Dr Avram   ESOPHAGOGASTRODUODENOSCOPY  multiple   INGUINAL HERNIA REPAIR Left 1997   INGUINAL HERNIA REPAIR Bilateral 10/24/2022   Procedure: LAPAROSCOPIC BILATERAL INGUINAL HERNIA  REPAIR WITH MESH;  Surgeon: Rubin Calamity, MD;  Location: [facility] OR;  Service: General;  Laterality: Bilateral;   LEFT HEART CATH AND CORONARY ANGIOGRAPHY N/A 05/24/2022   Procedure: LEFT HEART CATH AND CORONARY ANGIOGRAPHY;  Surgeon:  Dann Candyce RAMAN, MD;  Location: [facility] INVASIVE CV LAB;  Service: Cardiovascular;  Laterality: N/A;   PACEMAKER IMPLANT N/A 08/24/2023   Procedure: PACEMAKER IMPLANT;  Surgeon: Waddell Danelle ORN, MD;  Location: [facility] INVASIVE CV LAB;  Service: Cardiovascular;  Laterality: N/A;   TONSILLECTOMY     Family History Family History  Problem Relation Age of Onset   Alzheimer's disease Mother    Heart disease Father        CHF   Stroke Father 60   Tremor Father    Tremor Sister    Pulmonary embolism Brother    Diabetes Brother    Heart disease Paternal Grandfather    Asthma Neg Hx    Colon cancer Neg Hx    Esophageal cancer Neg Hx    Pancreatic cancer Neg Hx    Prostate cancer Neg Hx    Rectal cancer Neg Hx    Stomach cancer Neg Hx     Social History Social History   Tobacco Use   Smoking status: Former    Current packs/day: 0.00    Types: Cigarettes, E-cigarettes    Quit date: 10/31/2012    Years since quitting: 11.0   Smokeless tobacco: Never   Tobacco comments:    smoked ages [age], up to 1 ppd . Smoked 2-3 cigars/ day [age]  Vaping Use   Vaping status: Some Days  Substance Use Topics   Alcohol  use: Yes    Comment: 1-2 per week   Drug use: No   Allergies Patient has no known allergies.  Review of Systems Review of Systems  Physical Exam Vital Signs  I have reviewed the triage vital signs BP 119/72   Pulse 95   Temp 98 F (36.7 C)   Resp (!) 24   SpO2 96%   Physical Exam Vitals reviewed.  HENT:     Head: Normocephalic.  Eyes:     Pupils: Pupils are equal, round, and reactive to light.  Cardiovascular:     Rate and Rhythm: Normal rate.     Pulses: Normal pulses.  Pulmonary:     Effort: Pulmonary effort is normal.     Breath  sounds: Normal breath sounds.  Abdominal:     General: Abdomen is flat. There is no distension.     Palpations: Abdomen is soft.     Tenderness: There is no abdominal tenderness. There is no guarding.  Musculoskeletal:        General: No swelling or tenderness. Normal range of motion.     Cervical back: Normal range of motion.     Right lower leg: No edema.     Left lower leg: No edema.  Skin:    General: Skin is warm and dry.  Neurological:     Mental Status: He is alert.     Comments: Resting tremor.  5-5 strength with straight leg lift bilaterally.  Intact sensation in the bilateral lower extremities     ED Results and Treatments Labs (all labs ordered are listed, but only abnormal results are displayed) Labs Reviewed  BASIC METABOLIC PANEL - Abnormal; Notable for the following components:      Result Value   Sodium 134 (*)    Chloride 97 (*)    Glucose, Bld 122 (*)    BUN 36 (*)    Creatinine, Ser 1.59 (*)    Calcium  10.4 (*)    GFR, Estimated 44 (*)    All other components within normal limits  CBC - Abnormal;  Notable for the following components:   RDW 16.2 (*)    All other components within normal limits  URINALYSIS, ROUTINE W REFLEX MICROSCOPIC - Abnormal; Notable for the following components:   Glucose, UA >1,000 (*)    Leukocytes,Ua SMALL (*)    Bacteria, UA RARE (*)    All other components within normal limits  CBG MONITORING, ED                                                                                                                          Radiology CT Head Wo Contrast Result Date: 11/16/2023 CLINICAL DATA:  Dizziness EXAM: CT HEAD WITHOUT CONTRAST TECHNIQUE: Contiguous axial images were obtained from the base of the skull through the vertex without intravenous contrast. RADIATION DOSE REDUCTION: This exam was performed according to the departmental dose-optimization program which includes automated exposure control, adjustment of the mA and/or kV  according to patient size and/or use of iterative reconstruction technique. COMPARISON:  MRI 08/17/2023 FINDINGS: Brain: No acute territorial infarction, hemorrhage or intracranial mass. Atrophy and mild chronic small vessel ischemic changes of the white matter. Stable ventricle size Vascular: No hyperdense vessels. Mild carotid vascular calcification Skull: Normal. Negative for fracture or focal lesion. Sinuses/Orbits: No acute finding. Other: None IMPRESSION: 1. No CT evidence for acute intracranial abnormality. 2. Atrophy and mild chronic small vessel ischemic changes of the white matter. Electronically Signed   By: Luke Bun M.D.   On: 11/16/2023 18:42    Pertinent labs & imaging results that were available during my care of the patient were reviewed by me and considered in my medical decision making (see MDM for details).  Medications Ordered in ED Medications  sodium chloride  0.9 % bolus 500 mL (0 mLs Intravenous Stopped 11/16/23 1859)  sodium chloride  0.9 % bolus 1,000 mL (1,000 mLs Intravenous New Bag/Given 11/16/23 2100)                                                                                                                                     Procedures Procedures  (including critical care time)  Medical Decision Making / ED Course   This patient presents to the ED for concern of lightheadedness upon standing, leg weakness, this involves an extensive number of treatment options, and is a complaint that carries with it a high risk of complications and morbidity.  The differential diagnosis includes orthostatic  hypotension, dehydration, Parkinson's disease, ICH, cystitis.  MDM: Patient is EKG shows a paced rhythm.  Heart rate is a little bit on the higher side, could certainly be a bit dry.  Patient has no focal findings on exam, and the descriptions of intermittent the weakness is more consistent with his Parkinson's rather than ICH.  Will obtain imaging of the patient's head,  routine blood work.  I have provided the patient with 500 cc of fluid.  Lower suspicion for infectious cause at this time.  Reassessment 1 PM-patient's renal function is a bit worse than previously.  Urine appears concentrated in the room.  Believe dehydration is playing a role in patient's symptoms.  I provided him with 1500 cc of fluid here in the emergency room.  Patient's son is arrived, lives right down the street from the patient.  We discussed different options for this patient which included admission for IV fluids for mild acute kidney injury, versus returning home, pushing IV fluids and following up with a PCP.  Both the patient and the patient's son prefer discharge.   Additional history obtained: -Additional history obtained from son at bedside -External records from outside source obtained and reviewed including: Chart review including previous notes, labs, imaging, consultation notes   Lab Tests: -I ordered, reviewed, and interpreted labs.   The pertinent results include:   Labs Reviewed  BASIC METABOLIC PANEL - Abnormal; Notable for the following components:      Result Value   Sodium 134 (*)    Chloride 97 (*)    Glucose, Bld 122 (*)    BUN 36 (*)    Creatinine, Ser 1.59 (*)    Calcium  10.4 (*)    GFR, Estimated 44 (*)    All other components within normal limits  CBC - Abnormal; Notable for the following components:   RDW 16.2 (*)    All other components within normal limits  URINALYSIS, ROUTINE W REFLEX MICROSCOPIC - Abnormal; Notable for the following components:   Glucose, UA >1,000 (*)    Leukocytes,Ua SMALL (*)    Bacteria, UA RARE (*)    All other components within normal limits  CBG MONITORING, ED      EKG atrial paced rhythm without evidence of acute ischemia  EKG Interpretation Date/Time:    Ventricular Rate:    PR Interval:    QRS Duration:    QT Interval:    QTC Calculation:   R Axis:      Text Interpretation:           Imaging Studies  ordered: I ordered imaging studies including CT imaging of the head I independently visualized and interpreted imaging. I agree with the radiologist interpretation   Medicines ordered and prescription drug management: Meds ordered this encounter  Medications   sodium chloride  0.9 % bolus 500 mL   sodium chloride  0.9 % bolus 1,000 mL    -I have reviewed the patients home medicines and have made adjustments as needed   Cardiac Monitoring: The patient was maintained on a cardiac monitor.  I personally viewed and interpreted the cardiac monitored which showed an underlying rhythm of: Sinus rhythm  Social Determinants of Health:  Factors impacting patients care include: Multiple medical comorbidities including Parkinson's disease   Reevaluation: After the interventions noted above, I reevaluated the patient and found that they have :improved  Co morbidities that complicate the patient evaluation  Past Medical History:  Diagnosis Date   CAP (community acquired pneumonia) 1968  [place] [employer])   Cataract    COPD (chronic obstructive pulmonary disease) (HCC)    Diabetes mellitus    Diverticulosis of colon 11/19/2012   left colon, Dr Avram   GERD (gastroesophageal reflux disease)    History of adenomatous polyps of colon - probable attenuated polyposis 07/17/2008   2004: 2 polyps max 8 mm 1 lost and 1 infammatory 2010: 7 polyps TV and tubular adenomas max 6 mm 11/19/2012 :4 polyps; max 1 cm removed; all adenomas 01/05/2017 8 polyps max 10 mm adenomas recall 2021     Hyperlipidemia    Hypertension    Lumbago    buldging disc L4-5   Personal history of adenomatous colonic polyps 07/17/2008   2004 2 polyps max 8 mm (adenomas) 2010 7 polyps TV and tubular adenomas max 6 mm 11/19/2012        Dispostion: I considered admission for this patient, however patient patient's son comfortable with outpatient management.     Final Clinical Impression(s) / ED Diagnoses Final  diagnoses:  Acute kidney injury (HCC)     @PCDICTATION @    Mannie Pac T, DO 11/16/23 2128

## 2023-11-17 ENCOUNTER — Telehealth: Payer: Self-pay | Admitting: Family Medicine

## 2023-11-17 NOTE — Telephone Encounter (Signed)
 Pt has an appt with Dr Doreene Burke on 11/21/23 for a hosp f/up. He was admitted.

## 2023-11-20 NOTE — Transitions of Care (Post Inpatient/ED Visit) (Signed)
 11/20/2023  Name: Karl Lawson. MRN: [id number] DOB: 11/30/1945  Today's TOC FU Call Status: Today's TOC FU Call Status:: Successful TOC FU Call Completed TOC FU Call Complete Date: 11/20/23 Patient's Name and Date of Birth confirmed.  Transition Care Management Follow-up Telephone Call Date of Discharge: 11/16/23 Discharge Facility: [facility] ([facility]) Type of Discharge: Emergency Department How have you been since you were released from the hospital?: Better  Items Reviewed: Did you receive and understand the discharge instructions provided?: Yes Any new allergies since your discharge?: No Dietary orders reviewed?: NA Do you have support at home?: Yes  Medications Reviewed Today: Medications Reviewed Today     Reviewed by Alessandra Dedra PARAS, RN (Registered Nurse) on 11/20/23 at 1124  Med List Status: <None>   Medication Order Taking? Sig Documenting Provider Last Dose Status Informant  acetaminophen  (TYLENOL ) 500 MG tablet [id number] Yes Take 500 mg by mouth every 6 (six) hours as needed for moderate pain. [provider] Taking Active Child, Pharmacy Records  albuterol  (VENTOLIN  HFA) 108 [phone] Base) MCG/ACT inhaler [id number] Yes Inhale 2 puffs into the lungs every 6 (six) hours as needed for wheezing or shortness of breath. [provider] Taking Active Child, Pharmacy Records  aspirin  EC 81 MG tablet [id number] Yes Take 81 mg by mouth daily. [provider] Taking Active Child, Pharmacy Records           Med Note (COFFELL, JON CHRISTELLA Schaumann Aug 17, 2023  7:55 PM) Per pt's son, medication is in pt's pill box but not in the daily divider.  atorvastatin  (LIPITOR) 40 MG tablet [id number] Yes Take 1 tablet (40 mg total) by mouth daily. Perri DELENA Meliton Mickey., MD Taking Active   carbidopa -levodopa  (SINEMET  IR) 25-100 MG tablet [id number] Yes TAKE 1.5 TABLETS BY MOUTH 3 (THREE) TIMES DAILY. 6AM/10-11AM/3-4PM Tat, Asberry RAMAN, DO Taking Active Child,  Pharmacy Records  chlorthalidone  (HYGROTON ) 25 MG tablet [id number] Yes TAKE 1 TABLET (25 MG TOTAL) BY MOUTH DAILY. Berneta Elsie Sayre, MD Taking Active   dorzolamide-timolol (COSOPT) 2-0.5 % ophthalmic solution [id number] Yes Place 1 drop into the left eye 2 (two) times daily. [provider] Taking Active Multiple Informants, Pharmacy Records           Med Note ROSELEE, TEENA FORBES Schaumann Aug 24, 2023 10:17 PM) Last fill MARCH 7TH 2024 51 DAY SUPPLY BY DR. VAN  finasteride  (PROSCAR ) 5 MG tablet [id number] Yes Take 1 tablet (5 mg total) by mouth daily. Berneta Elsie Sayre, MD Taking Active Child, Pharmacy Records  Glucose 15 g PACK [id number] Yes Take 15 g by mouth as needed (hypoglycemia). [provider] Taking Active Child, Pharmacy Records  insulin  aspart (NOVOLOG ) 100 UNIT/ML injection [id number] Yes Inject 0-9 Units into the skin 3 (three) times daily with meals. CBG < 70:treat low blood sugar CBG 70 - 120: 0 units  CBG 121 - 150: 1 unit  CBG 151 - 200: 2 units  CBG 201 - 250: 3 units  CBG 251 - 300: 5 units  CBG 301 - 350: 7 units  CBG 351 - 400: 9 units  CBG > 400: call MD Perri DELENA Meliton Mickey., MD Taking Active   JARDIANCE  10 MG TABS tablet [id number] Yes Take 10 mg by mouth daily. [provider] Taking Active Child, Pharmacy Records  losartan  (COZAAR ) 100 MG tablet [id number] Yes Take 1 tablet (100 mg total) by mouth daily. Berneta Elsie Sayre, MD Taking Active   Metamucil Fiber  CHEW [id number] Yes Chew 1 each by mouth daily. [provider] Taking Active Child, Pharmacy Records  metFORMIN  (GLUCOPHAGE ) 1000 MG tablet [id number] Yes Take 1 tablet (1,000 mg total) by mouth 2 (two) times daily with a meal. Dann Candyce RAMAN, MD Taking Active Child, Pharmacy Records  Misc. Devices MISC [id number] Yes Rollator. G20.UNK Billy Knee, FNP Taking Active   omeprazole  (PRILOSEC) 40 MG capsule [id number] Yes Take 1 capsule (40 mg total) by mouth daily.  Berneta Elsie Sayre, MD Taking Active Child, Pharmacy Records  tobramycin (TOBREX) 0.3 % ophthalmic solution [id number] No 1 drop every 4 (four) hours.  Patient not taking: Reported on 11/20/2023   [provider] Not Taking Active Multiple Informants, Pharmacy Records           Med Note (SATTERFIELD, TEENA FORBES Schaumann Aug 24, 2023 10:24 PM) Not sure of actual directs because is was not in Dr. Annemarie and patient wasn't sure about it  traMADol  (ULTRAM ) 50 MG tablet [id number] No Take 50 mg by mouth every 6 (six) hours as needed (pain).  Patient not taking: Reported on 11/15/2023   [provider] Not Taking Active Child, Pharmacy Records            Home Care and Equipment/Supplies: Were Home Health Services Ordered?: NA Any new equipment or medical supplies ordered?: NA  Functional Questionnaire: Do you need assistance with bathing/showering or dressing?: No Do you need assistance with meal preparation?: No Do you need assistance with eating?: No Do you have difficulty maintaining continence: No Do you need assistance with getting out of bed/getting out of a chair/moving?: No Do you have difficulty managing or taking your medications?: No  Follow up appointments reviewed: PCP Follow-up appointment confirmed?: Yes Date of PCP follow-up appointment?: 11/21/23 Follow-up Provider: Berneta Do you understand care options if your condition(s) worsen?: Yes-patient verbalized understanding    SIGNATURE Dedra Sorenson, BSN, RN

## 2023-11-21 ENCOUNTER — Encounter: Payer: Self-pay | Admitting: Family Medicine

## 2023-11-21 ENCOUNTER — Ambulatory Visit: Payer: Medicare Other | Admitting: Family Medicine

## 2023-11-21 VITALS — BP 116/64 | HR 77 | Temp 98.1°F | Ht 68.0 in | Wt 204.8 lb

## 2023-11-21 DIAGNOSIS — G20A2 Parkinson's disease without dyskinesia, with fluctuations: Secondary | ICD-10-CM

## 2023-11-21 DIAGNOSIS — R29898 Other symptoms and signs involving the musculoskeletal system: Secondary | ICD-10-CM

## 2023-11-21 DIAGNOSIS — E860 Dehydration: Secondary | ICD-10-CM

## 2023-11-21 DIAGNOSIS — Z09 Encounter for follow-up examination after completed treatment for conditions other than malignant neoplasm: Secondary | ICD-10-CM

## 2023-11-21 NOTE — Progress Notes (Signed)
 Established Patient Office Visit   Subjective:  Patient ID: Karl Spray., male    DOB: Jul 01, 1946  Age: 78 y.o. MRN: [id number]  Chief Complaint  Patient presents with   Hospitalization Follow-up    Pt was in ER for dehydration.     HPI Encounter Diagnoses  Name Primary?   Hospital discharge follow-up Yes   Dehydration    Weakness of left lower extremity    Parkinson's disease with fluctuating manifestations, unspecified whether dyskinesia present [facility])    For hospital discharge follow-up status post dehydration with AKI.  He received fluid resuscitation.  Has been working hard to rehydrate himself.  Drank 4 16 ounce mugs of water today.  Admits that he does not always hydrate as well as he should.  He lives alone.  His wife is in assisted living in the final stages of Alzheimer's disease.  He has a history of Parkinson's.  Over the last week or 2 he has developed some weakness in numbness in his left lower extremity.  He is using a rollator.  He has fallen 2 or 3 times at home.  CT of head obtained in his recent hospitalization showed no acute changes.  He is accompanied by his sister-in-law today.   Review of Systems  Constitutional: Negative.   HENT: Negative.    Eyes:  Negative for blurred vision, discharge and redness.  Respiratory: Negative.    Cardiovascular: Negative.   Gastrointestinal:  Negative for abdominal pain.  Genitourinary: Negative.   Musculoskeletal: Negative.  Negative for myalgias.  Skin:  Negative for rash.  Neurological:  Positive for tingling and weakness. Negative for loss of consciousness.  Endo/Heme/Allergies:  Negative for polydipsia.     Current Outpatient Medications:    acetaminophen  (TYLENOL ) 500 MG tablet, Take 500 mg by mouth every 6 (six) hours as needed for moderate pain., Disp: , Rfl:    albuterol  (VENTOLIN  HFA) 108 (90 Base) MCG/ACT inhaler, Inhale 2 puffs into the lungs every 6 (six) hours as needed for wheezing or shortness of  breath., Disp: , Rfl:    aspirin  EC 81 MG tablet, Take 81 mg by mouth daily., Disp: , Rfl:    atorvastatin  (LIPITOR) 40 MG tablet, Take 1 tablet (40 mg total) by mouth daily., Disp: , Rfl:    carbidopa -levodopa  (SINEMET  IR) 25-100 MG tablet, TAKE 1.5 TABLETS BY MOUTH 3 (THREE) TIMES DAILY. 6AM/10-11AM/3-4PM, Disp: 405 tablet, Rfl: 0   chlorthalidone  (HYGROTON ) 25 MG tablet, TAKE 1 TABLET (25 MG TOTAL) BY MOUTH DAILY., Disp: 90 tablet, Rfl: 0   dorzolamide-timolol (COSOPT) 2-0.5 % ophthalmic solution, Place 1 drop into the left eye 2 (two) times daily., Disp: , Rfl:    finasteride  (PROSCAR ) 5 MG tablet, Take 1 tablet (5 mg total) by mouth daily., Disp: 90 tablet, Rfl: 3   Glucose 15 g PACK, Take 15 g by mouth as needed (hypoglycemia)., Disp: , Rfl:    insulin  aspart (NOVOLOG ) 100 UNIT/ML injection, Inject 0-9 Units into the skin 3 (three) times daily with meals. CBG < 70:treat low blood sugar CBG 70 - 120: 0 units  CBG 121 - 150: 1 unit  CBG 151 - 200: 2 units  CBG 201 - 250: 3 units  CBG 251 - 300: 5 units  CBG 301 - 350: 7 units  CBG 351 - 400: 9 units  CBG > 400: call MD, Disp: , Rfl:    JARDIANCE  10 MG TABS tablet, Take 10 mg by mouth daily., Disp: , Rfl:  losartan  (COZAAR ) 100 MG tablet, Take 1 tablet (100 mg total) by mouth daily., Disp: 90 tablet, Rfl: 1   Metamucil Fiber CHEW, Chew 1 each by mouth daily., Disp: , Rfl:    metFORMIN  (GLUCOPHAGE ) 1000 MG tablet, Take 1 tablet (1,000 mg total) by mouth 2 (two) times daily with a meal., Disp: 60 tablet, Rfl: 0   Misc. Devices MISC, Rollator. G20.A2, Disp: 1 each, Rfl: 0   omeprazole  (PRILOSEC) 40 MG capsule, Take 1 capsule (40 mg total) by mouth daily., Disp: 90 capsule, Rfl: 3   tobramycin (TOBREX) 0.3 % ophthalmic solution, 1 drop every 4 (four) hours., Disp: , Rfl:    Objective:     BP 116/64   Pulse 77   Temp 98.1 F (36.7 C)   Ht 5' 8 (1.727 m)   Wt 204 lb 12.8 oz (92.9 kg)   SpO2 99%   BMI 31.14 kg/m  BP Readings from Last 3  Encounters:  11/21/23 116/64  11/16/23 119/72  11/15/23 100/60   Wt Readings from Last 3 Encounters:  11/21/23 204 lb 12.8 oz (92.9 kg)  11/15/23 200 lb 12.8 oz (91.1 kg)  09/21/23 202 lb 9.6 oz (91.9 kg)      Physical Exam Constitutional:      General: He is not in acute distress.    Appearance: Normal appearance. He is not ill-appearing, toxic-appearing or diaphoretic.  HENT:     Head: Normocephalic and atraumatic.     Right Ear: External ear normal.     Left Ear: External ear normal.  Eyes:     General: No scleral icterus.       Right eye: No discharge.        Left eye: No discharge.     Extraocular Movements: Extraocular movements intact.     Conjunctiva/sclera: Conjunctivae normal.  Cardiovascular:     Rate and Rhythm: Normal rate and regular rhythm.  Pulmonary:     Effort: Pulmonary effort is normal. No respiratory distress.     Breath sounds: Normal breath sounds.  Musculoskeletal:     Cervical back: No rigidity or tenderness.  Skin:    General: Skin is warm and dry.  Neurological:     Mental Status: He is alert and oriented to person, place, and time.  Psychiatric:        Mood and Affect: Mood normal.        Behavior: Behavior normal.      No results found for any visits on 11/21/23.  Yeah I have ordered  The ASCVD Risk score (Arnett DK, et al., 2019) failed to calculate for the following reasons:   The valid total cholesterol range is 130 to 320 mg/dL    Assessment & Plan:   Hospital discharge follow-up  Dehydration -     Basic metabolic panel -     Urinalysis, Routine w reflex microscopic -     AMB Referral [facility] Care Management  Weakness of left lower extremity -     Ambulatory referral to Neurology  Parkinson's disease with fluctuating manifestations, unspecified whether dyskinesia present [facility]) -     Ambulatory referral to Neurology -     AMB Referral [facility] Care Management    Return in about 3 months (around 02/19/2024), or if symptoms  worsen or fail to improve.  Referral to his neurologist for evaluation of left lower extremity weakness and paresthesia.  Referral for RN case management for home assessment and whether or not it is safe for him  to be alone.  Elsie Sim Lent, MD

## 2023-11-22 LAB — URINALYSIS, ROUTINE W REFLEX MICROSCOPIC
Bilirubin Urine: NEGATIVE
Hgb urine dipstick: NEGATIVE
Ketones, ur: NEGATIVE
Nitrite: NEGATIVE
RBC / HPF: NONE SEEN (ref 0–?)
Specific Gravity, Urine: <=1.005 — AB (ref 1.000–1.030)
Total Protein, Urine: NEGATIVE
Urine Glucose: 500 — AB
Urobilinogen, UA: 0.2 (ref 0.0–1.0)
pH: 6 (ref 5.0–8.0)

## 2023-11-22 LAB — BASIC METABOLIC PANEL
BUN: 20 mg/dL (ref 6–23)
CO2: 26 meq/L (ref 19–32)
Calcium: 10.2 mg/dL (ref 8.4–10.5)
Chloride: 92 meq/L — ABNORMAL LOW (ref 96–112)
Creatinine, Ser: 1.13 mg/dL (ref 0.40–1.50)
GFR: 62.41 mL/min (ref >60.00)
Glucose, Bld: 86 mg/dL (ref 70–99)
Potassium: 3.8 meq/L (ref 3.5–5.1)
Sodium: 129 meq/L — ABNORMAL LOW (ref 135–145)

## 2023-11-23 ENCOUNTER — Other Ambulatory Visit: Payer: Self-pay

## 2023-11-23 ENCOUNTER — Encounter (HOSPITAL_COMMUNITY): Payer: Self-pay | Admitting: Emergency Medicine

## 2023-11-23 ENCOUNTER — Emergency Department (HOSPITAL_COMMUNITY): Payer: Medicare Other

## 2023-11-23 ENCOUNTER — Telehealth: Payer: Self-pay | Admitting: *Deleted

## 2023-11-23 ENCOUNTER — Inpatient Hospital Stay (HOSPITAL_COMMUNITY)
Admission: EM | Admit: 2023-11-23 | Discharge: 2023-12-04 | DRG: 871 | Disposition: A | Discharge status: Skilled Nursing Facility | Payer: Medicare Other | Attending: Internal Medicine | Admitting: Internal Medicine

## 2023-11-23 DIAGNOSIS — E538 Deficiency of other specified B group vitamins: Secondary | ICD-10-CM

## 2023-11-23 DIAGNOSIS — Z820 Family history of epilepsy and other diseases of the nervous system: Secondary | ICD-10-CM

## 2023-11-23 DIAGNOSIS — E669 Obesity, unspecified: Secondary | ICD-10-CM | POA: Diagnosis present

## 2023-11-23 DIAGNOSIS — Z7984 Long term (current) use of oral hypoglycemic drugs: Secondary | ICD-10-CM

## 2023-11-23 DIAGNOSIS — N179 Acute kidney failure, unspecified: Secondary | ICD-10-CM

## 2023-11-23 DIAGNOSIS — Z833 Family history of diabetes mellitus: Secondary | ICD-10-CM

## 2023-11-23 DIAGNOSIS — E1165 Type 2 diabetes mellitus with hyperglycemia: Secondary | ICD-10-CM | POA: Diagnosis not present

## 2023-11-23 DIAGNOSIS — N1832 Chronic kidney disease, stage 3b: Secondary | ICD-10-CM | POA: Diagnosis present

## 2023-11-23 DIAGNOSIS — I442 Atrioventricular block, complete: Secondary | ICD-10-CM | POA: Diagnosis present

## 2023-11-23 DIAGNOSIS — Z8249 Family history of ischemic heart disease and other diseases of the circulatory system: Secondary | ICD-10-CM

## 2023-11-23 DIAGNOSIS — E1122 Type 2 diabetes mellitus with diabetic chronic kidney disease: Secondary | ICD-10-CM | POA: Diagnosis present

## 2023-11-23 DIAGNOSIS — N400 Enlarged prostate without lower urinary tract symptoms: Secondary | ICD-10-CM | POA: Diagnosis present

## 2023-11-23 DIAGNOSIS — R296 Repeated falls: Secondary | ICD-10-CM

## 2023-11-23 DIAGNOSIS — Z860101 Personal history of adenomatous and serrated colon polyps: Secondary | ICD-10-CM

## 2023-11-23 DIAGNOSIS — G9341 Metabolic encephalopathy: Secondary | ICD-10-CM | POA: Diagnosis present

## 2023-11-23 DIAGNOSIS — G20A1 Parkinson's disease without dyskinesia, without mention of fluctuations: Secondary | ICD-10-CM | POA: Diagnosis present

## 2023-11-23 DIAGNOSIS — E871 Hypo-osmolality and hyponatremia: Secondary | ICD-10-CM

## 2023-11-23 DIAGNOSIS — Z9841 Cataract extraction status, right eye: Secondary | ICD-10-CM

## 2023-11-23 DIAGNOSIS — Z823 Family history of stroke: Secondary | ICD-10-CM

## 2023-11-23 DIAGNOSIS — R443 Hallucinations, unspecified: Secondary | ICD-10-CM | POA: Diagnosis present

## 2023-11-23 DIAGNOSIS — Z7982 Long term (current) use of aspirin: Secondary | ICD-10-CM

## 2023-11-23 DIAGNOSIS — Z87891 Personal history of nicotine dependence: Secondary | ICD-10-CM

## 2023-11-23 DIAGNOSIS — K8020 Calculus of gallbladder without cholecystitis without obstruction: Secondary | ICD-10-CM | POA: Diagnosis present

## 2023-11-23 DIAGNOSIS — R6520 Severe sepsis without septic shock: Secondary | ICD-10-CM | POA: Diagnosis present

## 2023-11-23 DIAGNOSIS — E860 Dehydration: Secondary | ICD-10-CM | POA: Diagnosis present

## 2023-11-23 DIAGNOSIS — E782 Mixed hyperlipidemia: Secondary | ICD-10-CM | POA: Diagnosis present

## 2023-11-23 DIAGNOSIS — Z6831 Body mass index (BMI) 31.0-31.9, adult: Secondary | ICD-10-CM

## 2023-11-23 DIAGNOSIS — Z66 Do not resuscitate: Secondary | ICD-10-CM | POA: Diagnosis present

## 2023-11-23 DIAGNOSIS — N3289 Other specified disorders of bladder: Secondary | ICD-10-CM | POA: Diagnosis present

## 2023-11-23 DIAGNOSIS — Z794 Long term (current) use of insulin: Secondary | ICD-10-CM

## 2023-11-23 DIAGNOSIS — Z950 Presence of cardiac pacemaker: Secondary | ICD-10-CM

## 2023-11-23 DIAGNOSIS — Z1612 Extended spectrum beta lactamase (ESBL) resistance: Secondary | ICD-10-CM | POA: Diagnosis present

## 2023-11-23 DIAGNOSIS — R5381 Other malaise: Secondary | ICD-10-CM | POA: Diagnosis present

## 2023-11-23 DIAGNOSIS — E113593 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, bilateral: Secondary | ICD-10-CM | POA: Diagnosis present

## 2023-11-23 DIAGNOSIS — N1330 Unspecified hydronephrosis: Secondary | ICD-10-CM | POA: Diagnosis present

## 2023-11-23 DIAGNOSIS — K5730 Diverticulosis of large intestine without perforation or abscess without bleeding: Secondary | ICD-10-CM | POA: Diagnosis present

## 2023-11-23 DIAGNOSIS — A419 Sepsis, unspecified organism: Secondary | ICD-10-CM | POA: Diagnosis not present

## 2023-11-23 DIAGNOSIS — Z79899 Other long term (current) drug therapy: Secondary | ICD-10-CM

## 2023-11-23 DIAGNOSIS — N3080 Other cystitis without hematuria: Secondary | ICD-10-CM | POA: Diagnosis present

## 2023-11-23 DIAGNOSIS — I129 Hypertensive chronic kidney disease with stage 1 through stage 4 chronic kidney disease, or unspecified chronic kidney disease: Secondary | ICD-10-CM | POA: Diagnosis present

## 2023-11-23 DIAGNOSIS — B961 Klebsiella pneumoniae [K. pneumoniae] as the cause of diseases classified elsewhere: Secondary | ICD-10-CM | POA: Diagnosis present

## 2023-11-23 DIAGNOSIS — N390 Urinary tract infection, site not specified: Secondary | ICD-10-CM

## 2023-11-23 DIAGNOSIS — Z87440 Personal history of urinary (tract) infections: Secondary | ICD-10-CM

## 2023-11-23 DIAGNOSIS — R0682 Tachypnea, not elsewhere classified: Secondary | ICD-10-CM | POA: Diagnosis present

## 2023-11-23 DIAGNOSIS — I10 Essential (primary) hypertension: Secondary | ICD-10-CM | POA: Diagnosis present

## 2023-11-23 DIAGNOSIS — J449 Chronic obstructive pulmonary disease, unspecified: Secondary | ICD-10-CM | POA: Diagnosis present

## 2023-11-23 LAB — CBC WITH DIFFERENTIAL/PLATELET
Abs Immature Granulocytes: 0.23 10*3/uL — ABNORMAL HIGH (ref 0.00–0.07)
Basophils Absolute: 0 10*3/uL (ref 0.0–0.1)
Basophils Relative: 0 %
Eosinophils Absolute: 0 10*3/uL (ref 0.0–0.5)
Eosinophils Relative: 0 %
HCT: 39.8 % (ref 39.0–52.0)
Hemoglobin: 13.3 g/dL (ref 13.0–17.0)
Immature Granulocytes: 1 %
Lymphocytes Relative: 7 %
Lymphs Abs: 1.7 10*3/uL (ref 0.7–4.0)
MCH: 26.6 pg (ref 26.0–34.0)
MCHC: 33.4 g/dL (ref 30.0–36.0)
MCV: 79.6 fL — ABNORMAL LOW (ref 80.0–100.0)
Monocytes Absolute: 1.9 10*3/uL — ABNORMAL HIGH (ref 0.1–1.0)
Monocytes Relative: 8 %
Neutro Abs: 18.5 10*3/uL — ABNORMAL HIGH (ref 1.7–7.7)
Neutrophils Relative %: 84 %
Platelets: 253 10*3/uL (ref 150–400)
RBC: 5 MIL/uL (ref 4.22–5.81)
RDW: 16.4 % — ABNORMAL HIGH (ref 11.5–15.5)
WBC: 22.3 10*3/uL — ABNORMAL HIGH (ref 4.0–10.5)
nRBC: 0 % (ref 0.0–0.2)

## 2023-11-23 LAB — URINALYSIS, W/ REFLEX TO CULTURE (INFECTION SUSPECTED)
Bilirubin Urine: NEGATIVE
Glucose, UA: >=500 mg/dL — AB
Hgb urine dipstick: NEGATIVE
Ketones, ur: 5 mg/dL — AB
Nitrite: NEGATIVE
Protein, ur: NEGATIVE mg/dL
Specific Gravity, Urine: 1.014 (ref 1.005–1.030)
pH: 5 (ref 5.0–8.0)

## 2023-11-23 LAB — I-STAT CG4 LACTIC ACID, ED: Lactic Acid, Venous: 2 mmol/L (ref 0.5–1.9)

## 2023-11-23 MED ORDER — LACTATED RINGERS IV BOLUS (SEPSIS)
1000.0000 mL | Freq: Once | INTRAVENOUS | Status: AC
  Administered 2023-11-23: 1000 mL via INTRAVENOUS

## 2023-11-23 MED ORDER — LACTATED RINGERS IV BOLUS (SEPSIS)
1000.0000 mL | Freq: Once | INTRAVENOUS | Status: AC
  Administered 2023-11-24: 1000 mL via INTRAVENOUS

## 2023-11-23 MED ORDER — LACTATED RINGERS IV SOLN: INTRAVENOUS | Status: DC

## 2023-11-23 MED ORDER — SODIUM CHLORIDE 0.9 % IV SOLN
2.0000 g | Freq: Once | INTRAVENOUS | Status: AC
  Administered 2023-11-23: 2 g via INTRAVENOUS
  Filled 2023-11-23: qty 20

## 2023-11-23 NOTE — ED Provider Notes (Signed)
[place] EMERGENCY DEPARTMENT AT [facility] Provider Note   CSN: [id number] Arrival date & time: 11/23/23  2049     History  Chief Complaint  Patient presents with   Fall    Multiple falls (x3 today) with UTI symptoms that started a few days ago. Pt states that Karl Lawson is having urinary retention, urinary frequency, and dysuria.     Karl Wayland. is a 78 y.o. male.  Patient with past medical history significant for hypertension, heart block, pacemaker, type II DM, COPD who presents to the emergency department complaining of 1 week of worsening weakness with multiple falls at home, urinary frequency, difficulty urinating, painful urination.  Karl Lawson states that 1 week ago Karl Lawson was seen at an outside ER where Karl Lawson was treated for an acute kidney injury with fluid.  Karl Lawson had follow-up labs at his primary care earlier this week which showed that his kidney injury had improved but that also showed possible urinary tract infection.  Patient reports history of multiple urinary tract infections since rehab stay last year secondary to pacemaker implant.  Karl Lawson also uses Jardiance and states Karl Lawson has frequent urinary tract infections thought to be due to this as well.  Karl Lawson denies abdominal pain, nausea, vomiting, chest pain, shortness of breath.  Upon my assessment Karl Lawson is noted to meet SIRS criteria with a heart rate of approximately 110, respiratory rate of 26.  Code sepsis was activated.   Fall       Home Medications Prior to Admission medications   Medication Sig Start Date End Date Taking? Authorizing Provider  acetaminophen (TYLENOL) 500 MG tablet Take 500 mg by mouth every 6 (six) hours as needed for moderate pain.   Yes [provider]  albuterol (VENTOLIN HFA) 108 (90 Base) MCG/ACT inhaler Inhale 2 puffs into the lungs every 6 (six) hours as needed for wheezing or shortness of breath.   Yes [provider]  aspirin EC 81 MG tablet Take 81 mg by mouth daily.   Yes [provider]  atorvastatin (LIPITOR) 40 MG tablet Take 1 tablet (40 mg total) by mouth daily. 08/26/23  Yes Zigmund Daniel., MD  carbidopa-levodopa (SINEMET IR) 25-100 MG tablet TAKE 1.5 TABLETS BY MOUTH 3 (THREE) TIMES DAILY. 6AM/10-11AM/3-4PM 08/16/23  Yes Tat, Octaviano Batty, DO  chlorthalidone (HYGROTON) 25 MG tablet TAKE 1 TABLET (25 MG TOTAL) BY MOUTH DAILY. 09/29/23  Yes Mliss Sax, MD  finasteride (PROSCAR) 5 MG tablet Take 1 tablet (5 mg total) by mouth daily. 05/10/23  Yes Mliss Sax, MD  insulin aspart (NOVOLOG) 100 UNIT/ML injection Inject 0-9 Units into the skin 3 (three) times daily with meals. CBG < 70:treat low blood sugar CBG 70 - 120: 0 units  CBG 121 - 150: 1 unit  CBG 151 - 200: 2 units  CBG 201 - 250: 3 units  CBG 251 - 300: 5 units  CBG 301 - 350: 7 units  CBG 351 - 400: 9 units  CBG > 400: call MD 08/25/23  Yes Zigmund Daniel., MD  JARDIANCE 10 MG TABS tablet Take 10 mg by mouth daily. 04/03/20  Yes [provider]  losartan (COZAAR) 100 MG tablet Take 1 tablet (100 mg total) by mouth daily. 10/11/23  Yes Mliss Sax, MD  Metamucil Fiber CHEW Chew 1 each by mouth daily.   Yes [provider]  metFORMIN (GLUCOPHAGE) 1000 MG tablet Take 1 tablet (1,000 mg total) by mouth  2 (two) times daily with a meal. 05/26/22  Yes Corky Crafts, MD  omeprazole (PRILOSEC) 40 MG capsule Take 1 capsule (40 mg total) by mouth daily. 05/10/23  Yes Mliss Sax, MD  dorzolamide-timolol (COSOPT) 2-0.5 % ophthalmic solution Place 1 drop into the left eye daily as needed (for eye pressure).    [provider]  Glucose 15 g PACK Take 15 g by mouth as needed (hypoglycemia).    [provider]  Misc. Devices MISC Rollator. G20.A2 11/16/23   Salvatore Decent, FNP  tobramycin (TOBREX) 0.3 % ophthalmic solution Place 1 drop into the left eye daily as needed (to prevent infection when getting injection).   Yes  [provider]      Allergies    Patient has no known allergies.    Review of Systems   Review of Systems  Physical Exam Updated Vital Signs BP (!) 90/58   Pulse 93   Temp 98.1 F (36.7 C) (Oral)   Resp (!) 5   Ht 5\' 8"  (1.727 m)   Wt 94.3 kg   SpO2 98%   BMI 31.63 kg/m  Physical Exam Vitals and nursing note reviewed.  Constitutional:      General: Karl Lawson is not in acute distress.    Appearance: Karl Lawson is well-developed.  HENT:     Head: Normocephalic and atraumatic.  Eyes:     Conjunctiva/sclera: Conjunctivae normal.  Cardiovascular:     Rate and Rhythm: Regular rhythm. Tachycardia present.  Pulmonary:     Effort: Pulmonary effort is normal. No respiratory distress.     Breath sounds: Normal breath sounds.  Abdominal:     Palpations: Abdomen is soft.     Tenderness: There is no abdominal tenderness.  Musculoskeletal:        General: No swelling.     Cervical back: Neck supple.     Right lower leg: No edema.     Left lower leg: No edema.  Skin:    General: Skin is warm and dry.     Capillary Refill: Capillary refill takes less than 2 seconds.  Neurological:     Mental Status: Karl Lawson is alert.  Psychiatric:        Mood and Affect: Mood normal.     ED Results / Procedures / Treatments   Labs (all labs ordered are listed, but only abnormal results are displayed) Labs Reviewed  CBC WITH DIFFERENTIAL/PLATELET - Abnormal; Notable for the following components:      Result Value   WBC 22.3 (*)    MCV 79.6 (*)    RDW 16.4 (*)    Neutro Abs 18.5 (*)    Monocytes Absolute 1.9 (*)    Abs Immature Granulocytes 0.23 (*)    All other components within normal limits  URINALYSIS, W/ REFLEX TO CULTURE (INFECTION SUSPECTED) - Abnormal; Notable for the following components:   APPearance HAZY (*)    Glucose, UA >=500 (*)    Ketones, ur 5 (*)    Leukocytes,Ua TRACE (*)    Bacteria, UA MANY (*)    All other components within normal limits  COMPREHENSIVE METABOLIC PANEL  - Abnormal; Notable for the following components:   Sodium 124 (*)    Chloride 90 (*)    CO2 20 (*)    Glucose, Bld 186 (*)    BUN 26 (*)    Creatinine, Ser 1.49 (*)    Total Protein 6.3 (*)    Total Bilirubin 2.0 (*)  GFR, Estimated 48 (*)    All other components within normal limits  I-STAT CG4 LACTIC ACID, ED - Abnormal; Notable for the following components:   Lactic Acid, Venous 2.0 (*)    All other components within normal limits  RESP PANEL BY RT-PCR (RSV, FLU A&B, COVID)  RVPGX2  CULTURE, BLOOD (ROUTINE X 2)  CULTURE, BLOOD (ROUTINE X 2)  URINE CULTURE  PROTIME-INR  APTT  I-STAT CG4 LACTIC ACID, ED  I-STAT CG4 LACTIC ACID, ED  I-STAT CG4 LACTIC ACID, ED    EKG None  Radiology DG Chest Port 1 View Result Date: 11/23/2023 CLINICAL DATA:  Fall, possible sepsis EXAM: PORTABLE CHEST 1 VIEW COMPARISON:  08/25/2023 FINDINGS: Left-sided pacing device as before. Scarring or atelectasis at the lingula and bilateral bases. No pleural effusion or pneumothorax. Normal cardiomediastinal silhouette with aortic atherosclerosis. IMPRESSION: No active disease. Scarring or atelectasis at the lingula and bilateral bases. Electronically Signed   By: Jasmine Pang M.D.   On: 11/23/2023 23:10    Procedures .Critical Care  Performed by: Darrick Grinder, PA-C Authorized by: Darrick Grinder, PA-C   Critical care provider statement:    Critical care time (minutes):  35   Critical care was time spent personally by me on the following activities:  Development of treatment plan with patient or surrogate, discussions with consultants, evaluation of patient's response to treatment, examination of patient, ordering and review of laboratory studies, ordering and review of radiographic studies, ordering and performing treatments and interventions, pulse oximetry, re-evaluation of patient's condition and review of old charts     Medications Ordered in ED Medications  lactated ringers infusion  (0 mLs Intravenous Hold 11/23/23 2242)  lactated ringers bolus 1,000 mL (0 mLs Intravenous Stopped 11/24/23 0020)    And  lactated ringers bolus 1,000 mL (0 mLs Intravenous Stopped 11/24/23 0119)    And  lactated ringers bolus 1,000 mL (1,000 mLs Intravenous New Bag/Given 11/24/23 0119)  cefTRIAXone (ROCEPHIN) 2 g in sodium chloride 0.9 % 100 mL IVPB (0 g Intravenous Stopped 11/24/23 0004)    ED Course/ Medical Decision Making/ A&P Clinical Course as of 11/24/23 0147  Thu Nov 23, 2023  2245 Heart rate 110, RR 26, SpO2 90 [LM]  2246 Patient able to provide small urine sample [LM]    Clinical Course User Index [LM] Pamala Duffel                                 Medical Decision Making Amount and/or Complexity of Data Reviewed Labs: ordered. Radiology: ordered.  Risk Prescription drug management.   This patient presents to the ED for concern of weakness, urinary symptoms, this involves an extensive number of treatment options, and is a complaint that carries with it a high risk of complications and morbidity.  The differential diagnosis includes sepsis, deconditioning, urinary tract infection, others   Co morbidities that complicate the patient evaluation  History of frequent UTIs, pacemaker   Additional history obtained:  Additional history obtained from family at bedside External records from outside source obtained and reviewed including primary care labs   Lab Tests:  I Ordered, and personally interpreted labs.  The pertinent results include:  Lactic acid 2.0, Na 124, BUN 26, Creatinine 1.49, leukocytosis with WBC 22,300; UA trace leukocytes, 11-20 WBC, many bacteria   Imaging Studies ordered:  I ordered imaging studies including chest x-ray  I independently visualized and interpreted  imaging which showed  No active disease. Scarring or atelectasis at the lingula and  bilateral bases.   I agree with the radiologist interpretation   Cardiac Monitoring: /  EKG:  The patient was maintained on a cardiac monitor.  I personally viewed and interpreted the cardiac monitored which showed an underlying rhythm of: sinus tachycardia   Consultations Obtained:  I requested consultation with the hospitalist, Dr.Kakrakandy  and discussed lab and imaging findings as well as pertinent plan - they recommend: admission   Problem List / ED Course / Critical interventions / Medication management   I ordered medication including lactated ringers bolus for fluid resuscitation, rocephin for antibiotics  Reevaluation of the patient after these medicines showed that the patient improved I have reviewed the patients home medicines and have made adjustments as needed   Test / Admission - Considered:  Patient meeting sepsis criteria, likely due to urosepsis. Patient needs admission for further IV fluids and antibiotics         Final Clinical Impression(s) / ED Diagnoses Final diagnoses:  Sepsis, due to unspecified organism, unspecified whether acute organ dysfunction present [facility] 2)    Rx / DC Orders ED Discharge Orders     None         Pamala Duffel 11/24/23 0147    Rondel Baton, MD 11/24/23 [phone]

## 2023-11-23 NOTE — Progress Notes (Signed)
Elink monitoring for the code sepsis protocol.  

## 2023-11-23 NOTE — Progress Notes (Signed)
Complex Care Management Note Care Guide Note  11/23/2023 Name: Karl Lawson. MRN: [id number] DOB: [birth year]   Complex Care Management Outreach Attempts: An unsuccessful telephone outreach was attempted today to offer the patient information about available complex care management services.  Follow Up Plan:  Additional outreach attempts will be made to offer the patient complex care management information and services.   Encounter Outcome:  No Answer  Gwenevere Ghazi  [facility]  [facility], [facility] Guide  Direct Dial: [phone]  Fax [phone]

## 2023-11-24 ENCOUNTER — Encounter (HOSPITAL_COMMUNITY): Payer: Self-pay | Admitting: Internal Medicine

## 2023-11-24 ENCOUNTER — Inpatient Hospital Stay (HOSPITAL_COMMUNITY): Payer: Medicare Other

## 2023-11-24 ENCOUNTER — Telehealth: Payer: Self-pay | Admitting: Neurology

## 2023-11-24 DIAGNOSIS — N1832 Chronic kidney disease, stage 3b: Secondary | ICD-10-CM | POA: Diagnosis present

## 2023-11-24 DIAGNOSIS — E113593 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, bilateral: Secondary | ICD-10-CM | POA: Diagnosis present

## 2023-11-24 DIAGNOSIS — I129 Hypertensive chronic kidney disease with stage 1 through stage 4 chronic kidney disease, or unspecified chronic kidney disease: Secondary | ICD-10-CM | POA: Diagnosis present

## 2023-11-24 DIAGNOSIS — A419 Sepsis, unspecified organism: Secondary | ICD-10-CM | POA: Diagnosis present

## 2023-11-24 DIAGNOSIS — E871 Hypo-osmolality and hyponatremia: Secondary | ICD-10-CM | POA: Diagnosis present

## 2023-11-24 DIAGNOSIS — N3000 Acute cystitis without hematuria: Secondary | ICD-10-CM

## 2023-11-24 DIAGNOSIS — E538 Deficiency of other specified B group vitamins: Secondary | ICD-10-CM

## 2023-11-24 DIAGNOSIS — E782 Mixed hyperlipidemia: Secondary | ICD-10-CM | POA: Diagnosis present

## 2023-11-24 DIAGNOSIS — Z6831 Body mass index (BMI) 31.0-31.9, adult: Secondary | ICD-10-CM | POA: Diagnosis not present

## 2023-11-24 DIAGNOSIS — N1330 Unspecified hydronephrosis: Secondary | ICD-10-CM | POA: Diagnosis present

## 2023-11-24 DIAGNOSIS — R443 Hallucinations, unspecified: Secondary | ICD-10-CM | POA: Diagnosis present

## 2023-11-24 DIAGNOSIS — E1122 Type 2 diabetes mellitus with diabetic chronic kidney disease: Secondary | ICD-10-CM | POA: Diagnosis present

## 2023-11-24 DIAGNOSIS — Z1612 Extended spectrum beta lactamase (ESBL) resistance: Secondary | ICD-10-CM | POA: Diagnosis present

## 2023-11-24 DIAGNOSIS — N179 Acute kidney failure, unspecified: Secondary | ICD-10-CM | POA: Diagnosis present

## 2023-11-24 DIAGNOSIS — R296 Repeated falls: Secondary | ICD-10-CM

## 2023-11-24 DIAGNOSIS — J449 Chronic obstructive pulmonary disease, unspecified: Secondary | ICD-10-CM | POA: Diagnosis present

## 2023-11-24 DIAGNOSIS — Z7984 Long term (current) use of oral hypoglycemic drugs: Secondary | ICD-10-CM | POA: Diagnosis not present

## 2023-11-24 DIAGNOSIS — Z66 Do not resuscitate: Secondary | ICD-10-CM | POA: Diagnosis present

## 2023-11-24 DIAGNOSIS — G9341 Metabolic encephalopathy: Secondary | ICD-10-CM | POA: Diagnosis present

## 2023-11-24 DIAGNOSIS — N400 Enlarged prostate without lower urinary tract symptoms: Secondary | ICD-10-CM | POA: Diagnosis present

## 2023-11-24 DIAGNOSIS — G20A1 Parkinson's disease without dyskinesia, without mention of fluctuations: Secondary | ICD-10-CM | POA: Diagnosis present

## 2023-11-24 DIAGNOSIS — N390 Urinary tract infection, site not specified: Secondary | ICD-10-CM

## 2023-11-24 DIAGNOSIS — Z794 Long term (current) use of insulin: Secondary | ICD-10-CM | POA: Diagnosis not present

## 2023-11-24 DIAGNOSIS — Z8249 Family history of ischemic heart disease and other diseases of the circulatory system: Secondary | ICD-10-CM | POA: Diagnosis not present

## 2023-11-24 DIAGNOSIS — I442 Atrioventricular block, complete: Secondary | ICD-10-CM | POA: Diagnosis present

## 2023-11-24 DIAGNOSIS — E860 Dehydration: Secondary | ICD-10-CM | POA: Diagnosis present

## 2023-11-24 DIAGNOSIS — E1165 Type 2 diabetes mellitus with hyperglycemia: Secondary | ICD-10-CM | POA: Diagnosis not present

## 2023-11-24 DIAGNOSIS — R6520 Severe sepsis without septic shock: Secondary | ICD-10-CM

## 2023-11-24 LAB — PROTIME-INR
INR: 1.1 (ref 0.8–1.2)
Prothrombin Time: 14.6 s (ref 11.4–15.2)

## 2023-11-24 LAB — RESP PANEL BY RT-PCR (RSV, FLU A&B, COVID)  RVPGX2
Influenza A by PCR: NEGATIVE
Influenza B by PCR: NEGATIVE
Resp Syncytial Virus by PCR: NEGATIVE
SARS Coronavirus 2 by RT PCR: NEGATIVE

## 2023-11-24 LAB — OSMOLALITY, URINE: Osmolality, Ur: 391 mosm/kg (ref 300–900)

## 2023-11-24 LAB — HEPATIC FUNCTION PANEL
ALT: <5 U/L (ref 0–44)
AST: 16 U/L (ref 15–41)
Albumin: 3.4 g/dL — ABNORMAL LOW (ref 3.5–5.0)
Alkaline Phosphatase: 100 U/L (ref 38–126)
Bilirubin, Direct: 0.4 mg/dL — ABNORMAL HIGH (ref 0.0–0.2)
Indirect Bilirubin: 1.3 mg/dL — ABNORMAL HIGH (ref 0.3–0.9)
Total Bilirubin: 1.7 mg/dL — ABNORMAL HIGH (ref 0.0–1.2)
Total Protein: 6.5 g/dL (ref 6.5–8.1)

## 2023-11-24 LAB — CBC WITH DIFFERENTIAL/PLATELET
Abs Immature Granulocytes: 0.13 10*3/uL — ABNORMAL HIGH (ref 0.00–0.07)
Basophils Absolute: 0 10*3/uL (ref 0.0–0.1)
Basophils Relative: 0 %
Eosinophils Absolute: 0 10*3/uL (ref 0.0–0.5)
Eosinophils Relative: 0 %
HCT: 40.9 % (ref 39.0–52.0)
Hemoglobin: 13.9 g/dL (ref 13.0–17.0)
Immature Granulocytes: 1 %
Lymphocytes Relative: 5 %
Lymphs Abs: 0.9 10*3/uL (ref 0.7–4.0)
MCH: 27.1 pg (ref 26.0–34.0)
MCHC: 34 g/dL (ref 30.0–36.0)
MCV: 79.7 fL — ABNORMAL LOW (ref 80.0–100.0)
Monocytes Absolute: 1.5 10*3/uL — ABNORMAL HIGH (ref 0.1–1.0)
Monocytes Relative: 7 %
Neutro Abs: 17.4 10*3/uL — ABNORMAL HIGH (ref 1.7–7.7)
Neutrophils Relative %: 87 %
Platelets: 244 10*3/uL (ref 150–400)
RBC: 5.13 MIL/uL (ref 4.22–5.81)
RDW: 16.4 % — ABNORMAL HIGH (ref 11.5–15.5)
WBC: 19.9 10*3/uL — ABNORMAL HIGH (ref 4.0–10.5)
nRBC: 0 % (ref 0.0–0.2)

## 2023-11-24 LAB — BASIC METABOLIC PANEL
Anion gap: 13 (ref 5–15)
BUN: 22 mg/dL (ref 8–23)
CO2: 21 mmol/L — ABNORMAL LOW (ref 22–32)
Calcium: 9.8 mg/dL (ref 8.9–10.3)
Chloride: 93 mmol/L — ABNORMAL LOW (ref 98–111)
Creatinine, Ser: 1.23 mg/dL (ref 0.61–1.24)
GFR, Estimated: >60 mL/min (ref >60)
Glucose, Bld: 190 mg/dL — ABNORMAL HIGH (ref 70–99)
Potassium: 3.8 mmol/L (ref 3.5–5.1)
Sodium: 127 mmol/L — ABNORMAL LOW (ref 135–145)

## 2023-11-24 LAB — HIV ANTIBODY (ROUTINE TESTING W REFLEX): HIV Screen 4th Generation wRfx: NONREACTIVE

## 2023-11-24 LAB — COMPREHENSIVE METABOLIC PANEL
ALT: <5 U/L (ref 0–44)
AST: 21 U/L (ref 15–41)
Albumin: 3.5 g/dL (ref 3.5–5.0)
Alkaline Phosphatase: 98 U/L (ref 38–126)
Anion gap: 14 (ref 5–15)
BUN: 26 mg/dL — ABNORMAL HIGH (ref 8–23)
CO2: 20 mmol/L — ABNORMAL LOW (ref 22–32)
Calcium: 9.8 mg/dL (ref 8.9–10.3)
Chloride: 90 mmol/L — ABNORMAL LOW (ref 98–111)
Creatinine, Ser: 1.49 mg/dL — ABNORMAL HIGH (ref 0.61–1.24)
GFR, Estimated: 48 mL/min — ABNORMAL LOW (ref >60)
Glucose, Bld: 186 mg/dL — ABNORMAL HIGH (ref 70–99)
Potassium: 4.4 mmol/L (ref 3.5–5.1)
Sodium: 124 mmol/L — ABNORMAL LOW (ref 135–145)
Total Bilirubin: 2 mg/dL — ABNORMAL HIGH (ref 0.0–1.2)
Total Protein: 6.3 g/dL — ABNORMAL LOW (ref 6.5–8.1)

## 2023-11-24 LAB — VITAMIN B12: Vitamin B-12: 55 pg/mL — ABNORMAL LOW (ref 180–914)

## 2023-11-24 LAB — APTT: aPTT: 33 s (ref 24–36)

## 2023-11-24 LAB — RPR: RPR Ser Ql: NONREACTIVE

## 2023-11-24 LAB — CK: Total CK: 67 U/L (ref 49–397)

## 2023-11-24 LAB — CBG MONITORING, ED
Glucose-Capillary: 176 mg/dL — ABNORMAL HIGH (ref 70–99)
Glucose-Capillary: 184 mg/dL — ABNORMAL HIGH (ref 70–99)
Glucose-Capillary: 169 mg/dL — ABNORMAL HIGH (ref 70–99)

## 2023-11-24 LAB — I-STAT CG4 LACTIC ACID, ED: Lactic Acid, Venous: 2.6 mmol/L (ref 0.5–1.9)

## 2023-11-24 LAB — TSH: TSH: 2.134 u[IU]/mL (ref 0.350–4.500)

## 2023-11-24 LAB — SODIUM, URINE, RANDOM: Sodium, Ur: 42 mmol/L

## 2023-11-24 LAB — AMMONIA: Ammonia: 30 umol/L (ref 9–35)

## 2023-11-24 LAB — LACTIC ACID, PLASMA: Lactic Acid, Venous: 1.5 mmol/L (ref 0.5–1.9)

## 2023-11-24 MED ORDER — CYANOCOBALAMIN 1000 MCG/ML IJ SOLN
1000.0000 ug | Freq: Every day | INTRAMUSCULAR | Status: DC
  Administered 2023-11-26 – 2023-11-28 (×3): 1000 ug via INTRAMUSCULAR
  Filled 2023-11-24 (×4): qty 1

## 2023-11-24 MED ORDER — ALBUTEROL SULFATE (2.5 MG/3ML) 0.083% IN NEBU
3.0000 mL | INHALATION_SOLUTION | Freq: Four times a day (QID) | RESPIRATORY_TRACT | Status: DC | PRN
  Administered 2023-11-26 – 2023-11-27 (×3): 3 mL via RESPIRATORY_TRACT
  Filled 2023-11-24 (×3): qty 3

## 2023-11-24 MED ORDER — HALOPERIDOL LACTATE 5 MG/ML IJ SOLN
2.0000 mg | Freq: Once | INTRAMUSCULAR | Status: AC | PRN
  Administered 2023-11-24: 2 mg via INTRAVENOUS
  Filled 2023-11-24: qty 1

## 2023-11-24 MED ORDER — ENOXAPARIN SODIUM 40 MG/0.4ML IJ SOSY
40.0000 mg | PREFILLED_SYRINGE | INTRAMUSCULAR | Status: DC
  Administered 2023-11-24 – 2023-12-04 (×11): 40 mg via SUBCUTANEOUS
  Filled 2023-11-24 (×11): qty 0.4

## 2023-11-24 MED ORDER — ACETAMINOPHEN 325 MG PO TABS
650.0000 mg | ORAL_TABLET | Freq: Four times a day (QID) | ORAL | Status: DC | PRN
  Administered 2023-11-24 – 2023-12-03 (×8): 650 mg via ORAL
  Filled 2023-11-24 (×8): qty 2

## 2023-11-24 MED ORDER — INSULIN ASPART 100 UNIT/ML IJ SOLN
0.0000 [IU] | Freq: Three times a day (TID) | INTRAMUSCULAR | Status: DC
  Administered 2023-11-24 (×3): 2 [IU] via SUBCUTANEOUS
  Administered 2023-11-25 (×2): 3 [IU] via SUBCUTANEOUS
  Administered 2023-11-26: 2 [IU] via SUBCUTANEOUS
  Administered 2023-11-26: 3 [IU] via SUBCUTANEOUS
  Administered 2023-11-26 – 2023-11-27 (×3): 2 [IU] via SUBCUTANEOUS
  Administered 2023-11-27: 3 [IU] via SUBCUTANEOUS
  Administered 2023-11-28: 2 [IU] via SUBCUTANEOUS
  Administered 2023-11-28: 5 [IU] via SUBCUTANEOUS
  Administered 2023-11-28 – 2023-11-29 (×2): 3 [IU] via SUBCUTANEOUS
  Administered 2023-11-29: 5 [IU] via SUBCUTANEOUS
  Administered 2023-11-29: 3 [IU] via SUBCUTANEOUS
  Administered 2023-11-30: 5 [IU] via SUBCUTANEOUS
  Administered 2023-11-30: 3 [IU] via SUBCUTANEOUS
  Administered 2023-11-30: 5 [IU] via SUBCUTANEOUS
  Administered 2023-12-01: 3 [IU] via SUBCUTANEOUS
  Administered 2023-12-01 – 2023-12-02 (×3): 5 [IU] via SUBCUTANEOUS
  Administered 2023-12-02: 2 [IU] via SUBCUTANEOUS
  Administered 2023-12-02: 7 [IU] via SUBCUTANEOUS
  Administered 2023-12-03 – 2023-12-04 (×4): 3 [IU] via SUBCUTANEOUS
  Administered 2023-12-04: 2 [IU] via SUBCUTANEOUS

## 2023-11-24 MED ORDER — SODIUM CHLORIDE 0.9 % IV SOLN
2.0000 g | INTRAVENOUS | Status: DC
  Administered 2023-11-24 – 2023-11-25 (×2): 2 g via INTRAVENOUS
  Filled 2023-11-24 (×2): qty 20

## 2023-11-24 MED ORDER — PANTOPRAZOLE SODIUM 40 MG PO TBEC
40.0000 mg | DELAYED_RELEASE_TABLET | Freq: Every day | ORAL | Status: DC
  Administered 2023-11-24 – 2023-12-04 (×11): 40 mg via ORAL
  Filled 2023-11-24 (×11): qty 1

## 2023-11-24 MED ORDER — INSULIN GLARGINE-YFGN 100 UNIT/ML ~~LOC~~ SOLN
15.0000 [IU] | Freq: Every day | SUBCUTANEOUS | Status: DC
  Administered 2023-11-24 – 2023-12-01 (×7): 15 [IU] via SUBCUTANEOUS
  Filled 2023-11-24 (×9): qty 0.15

## 2023-11-24 MED ORDER — CARBIDOPA-LEVODOPA 25-100 MG PO TABS
1.5000 | ORAL_TABLET | Freq: Three times a day (TID) | ORAL | Status: DC
  Administered 2023-11-24 – 2023-12-04 (×31): 1.5 via ORAL
  Filled 2023-11-24 (×31): qty 2

## 2023-11-24 MED ORDER — FINASTERIDE 5 MG PO TABS
5.0000 mg | ORAL_TABLET | Freq: Every day | ORAL | Status: DC
  Administered 2023-11-24 – 2023-12-04 (×11): 5 mg via ORAL
  Filled 2023-11-24 (×11): qty 1

## 2023-11-24 MED ORDER — ASPIRIN 81 MG PO TBEC
81.0000 mg | DELAYED_RELEASE_TABLET | Freq: Every day | ORAL | Status: DC
  Administered 2023-11-24 – 2023-12-04 (×11): 81 mg via ORAL
  Filled 2023-11-24 (×11): qty 1

## 2023-11-24 MED ORDER — ATORVASTATIN CALCIUM 40 MG PO TABS
40.0000 mg | ORAL_TABLET | Freq: Every day | ORAL | Status: DC
  Administered 2023-11-24 – 2023-12-04 (×11): 40 mg via ORAL
  Filled 2023-11-24 (×11): qty 1

## 2023-11-24 MED ORDER — TAMSULOSIN HCL 0.4 MG PO CAPS
0.4000 mg | ORAL_CAPSULE | Freq: Every day | ORAL | Status: DC
  Administered 2023-11-24 – 2023-12-03 (×10): 0.4 mg via ORAL
  Filled 2023-11-24 (×10): qty 1

## 2023-11-24 MED ORDER — LACTATED RINGERS IV SOLN: INTRAVENOUS | Status: AC

## 2023-11-24 MED ORDER — PHENAZOPYRIDINE HCL 100 MG PO TABS
100.0000 mg | ORAL_TABLET | Freq: Once | ORAL | Status: AC
  Administered 2023-11-24: 100 mg via ORAL
  Filled 2023-11-24: qty 1

## 2023-11-24 NOTE — Progress Notes (Signed)
Progress Note   Patient: Karl Lawson. WUX:[id number] DOB: Jan 02, 1946 DOA: 11/23/2023     0 DOS: the patient was seen and examined on 11/24/2023   Brief hospital course: Taken from H&P.  Karl Lawson. is a 78 y.o. male with history of Parkinson's disease, diabetes mellitus type 2, complete heart block status post pacemaker placement, COPD, hypertension was brought to the ER by patient's son after patient was having increasing confusion weakness and complaining of increasing burning micturition.  Patient has been having frequent falls and had come to the ER on 11/16/2023 when the scans showed nothing acute.  Patient was hydrated and discharged home.  Subsequent which patient has followed up with his primary care physician and also urologist.   On presentation patient has softer blood pressure, tachycardic and tachypneic,  Blood pressure improved with IV fluid.labs with leukocytosis at 22,000, UA concerning for UTI.  Sodium 124, BUN 26, creatinine 1.49, T. bili 2, lactic acid 2 and subsequent 2.6.  COVID and flu PCR were negative.  Chest x-ray with no active disease.  Some scarring or atelectasis at the lingula and bilateral bases. Blood and urine cultures were drawn and patient was started on on ceftriaxone.  1/17; vitals with mild tachycardia and tachypnea, remained on room air.  Repeat labs with slightly improving leukocytosis and renal function.  B12 levels low at 55-ordered IM replacement, sodium with some improvement 127, hyponatremia labs still pending.  Preliminary blood cultures negative, urine cultures pending. 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  Also noted BPH, cholelithiasis and diverticulosis without any sign of infection.  Starting on Flomax. Obtained postvoid bladder scan which was negative for any significant retention.    Assessment and Plan: * Sepsis [facility]) Patient met sepsis criteria with  leukocytosis, tachycardia and tachypnea.  Likely due to UTI.  CT renal stone study with mild bilateral hydronephrosis and some bladder distention.  Bladder scan with no significant retention.  Also noted some bladder wall thickening and small amount of air along the right lateral wall consistent with emphysematous cystitis. Also noted cholelithiasis and colonic diverticulosis with no sign of infection.  Prostate hypertrophy was also noted. -Continue with ceftriaxone -Follow-up final culture results  UTI (urinary tract infection) Pending urine cultures.  History of recurrent UTI -Continue with ceftriaxone  ARF (acute renal failure) (HCC) Seems like having CKD stage IIIa.  Creatinine now improved to baseline. -Monitor renal function -Avoid nephrotoxins  Hyponatremia Improving sodium to 127.  Patient was on chlorthalidone at home which should be discontinued on discharge. Hyponatremia labs ordered still pending. -Monitor sodium  Frequent falls And worsening weakness, likely due to poor p.o. intake and physical deconditioning.  CT head and cervical spine was negative for any acute abnormality. -PT/OT evaluation  AV block, 3rd degree (HCC) S/p pacemaker placement. -No acute concern  COPD (chronic obstructive pulmonary disease) (HCC) No concern of exacerbation. -Continue as needed bronchodilator  Essential hypertension Patient had softer blood pressure on admission so home losartan and chlorthalidone was held. 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 Recent A1c of 6.1 -Continue with Lantus and SSI  B12 deficiency Found  to have significantly  low B12 at 55. -Ordered IM supplement   Subjective: Patient was still having some burning micturition but stating that it is little improved than before.  No lower abdominal pain or urinary retention.  Physical Exam: Vitals:   11/24/23 0640 11/24/23 0700 11/24/23 0758 11/24/23 0830  BP: (!) 164/100 (!) 155/73  138/79  Pulse: (!) 117 (!) 125  (!) 110  Resp: (!) 23 (!) 33  (!) 22  Temp:   97.6 F (36.4 C)   TempSrc:   Oral   SpO2: 93% 97%  97%  Weight:      Height:       General.  Overweight elderly man, in no acute distress. Pulmonary.  Lungs clear bilaterally, normal respiratory effort. CV.  Regular rate and rhythm, no JVD, rub or murmur. Abdomen.  Soft, nontender, nondistended, BS positive. CNS.  Alert and oriented .  No focal neurologic deficit. Extremities.  No edema, no cyanosis, pulses intact and symmetrical.   Data Reviewed: Prior data reviewed  Family Communication: Discussed with sister-in-law at bedside  Disposition: Status is: Inpatient Remains inpatient appropriate because: Severity of illness  Planned Discharge Destination:  To be determined  DVT prophylaxis.  Lovenox Time spent:  minutes  This record has been created using Conservation officer, historic buildings. Errors have been sought and corrected,but may not always be located. Such creation errors do not reflect on the standard of care.   Author: Arnetha Courser, MD 11/24/2023 1:44 PM  For on call review [email or web address].

## 2023-11-24 NOTE — Assessment & Plan Note (Signed)
Improving sodium to 127.  Patient was on chlorthalidone at home which should be discontinued on discharge. Hyponatremia labs ordered still pending. -Monitor sodium

## 2023-11-24 NOTE — Assessment & Plan Note (Signed)
Seems like having CKD stage IIIa.  Creatinine now improved to baseline. -Monitor renal function -Avoid nephrotoxins

## 2023-11-24 NOTE — Assessment & Plan Note (Signed)
S/p pacemaker placement. -No acute concern

## 2023-11-24 NOTE — Telephone Encounter (Signed)
Called patient and left a message for a call back.  

## 2023-11-24 NOTE — Assessment & Plan Note (Signed)
Pending urine cultures.  History of recurrent UTI -Continue with ceftriaxone

## 2023-11-24 NOTE — Assessment & Plan Note (Signed)
No concern of exacerbation. -Continue as needed bronchodilator

## 2023-11-24 NOTE — Telephone Encounter (Signed)
Pt called and LM with AN. He has not urinated since yesterday morning. He's drank several glasses of water but still can't use bathroom. He went to an infusion for low kidney function earlier this week and his symptoms have returned.   I did notice he is now admitted in the hospital

## 2023-11-24 NOTE — Assessment & Plan Note (Signed)
Patient met sepsis criteria with leukocytosis, tachycardia and tachypnea.  Likely due to UTI.  CT renal stone study with mild bilateral hydronephrosis and some bladder distention.  Bladder scan with no significant retention.  Also noted some bladder wall thickening and small amount of air along the right lateral wall consistent with emphysematous cystitis. Also noted cholelithiasis and colonic diverticulosis with no sign of infection.  Prostate hypertrophy was also noted. -Continue with ceftriaxone -Follow-up final culture results

## 2023-11-24 NOTE — Assessment & Plan Note (Signed)
And worsening weakness, likely due to poor p.o. intake and physical deconditioning.  CT head and cervical spine was negative for any acute abnormality. -PT/OT evaluation

## 2023-11-24 NOTE — H&P (Addendum)
History and Physical    [name]. OZH:[id number] DOB: 1946/02/10 DOA: 11/23/2023  Patient coming from: Home.  Chief Complaint: Increasing burning micturition and weakness.  HPI: Hulett Golan. is a 78 y.o. male with history of Parkinson's disease, diabetes mellitus type 2, complete heart block status post pacemaker placement, COPD, hypertension was brought to the ER by patient's son after patient was having increasing confusion weakness and complaining of increasing burning micturition.  Patient has been having frequent falls and had come to the ER on 11/16/2023 when the scans showed nothing acute.  Patient was hydrated and discharged home.  Subsequent which patient has followed up with his primary care physician and also urologist.  As per the patient's son who provided most of the history patient has been getting more weaker frequent falls and has been having increasing burning micturition.  ED Course: In the ER patient was hypotensive tachycardic and lab work shows significant leukocytosis of 22,000 UA is concerning for UTI.  Patient was given sepsis protocol fluids and on ceftriaxone for sepsis from UTI.  Blood pressure improved with fluids.  Labs show sodium of 124 and lactic acid was elevated 2 and subsequent was 2.6.  COVID and flu test were negative.  Chest x-ray was showing nonspecific finding patient admitted for sepsis with acute renal failure hyponatremia and source of sepsis likely UTI.  Review of Systems: As per HPI, rest all negative.   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)   Cataract    COPD (chronic obstructive pulmonary disease) (HCC)    Diabetes mellitus    Diverticulosis of colon 11/19/2012   left colon, Dr Leone Payor   GERD (gastroesophageal reflux disease)    History of adenomatous polyps of colon - probable attenuated polyposis 07/17/2008   2004: 2 polyps max 8 mm 1 lost and 1 infammatory 2010:  7 polyps TV and tubular adenomas max 6 mm 11/19/2012 :4 polyps; max 1 cm removed; all adenomas 01/05/2017 8 polyps max 10 mm adenomas recall 2021     Hyperlipidemia    Hypertension    Lumbago    buldging disc L4-5   Personal history of adenomatous colonic polyps 07/17/2008   2004 2 polyps max 8 mm (adenomas) 2010 7 polyps TV and tubular adenomas max 6 mm 11/19/2012      Past Surgical History:  Procedure Laterality Date   APPENDECTOMY  1959   CATARACT EXTRACTION Right    COLONOSCOPY  multiple   adenomatous polyps, Dr Leone Payor   ESOPHAGOGASTRODUODENOSCOPY  multiple   INGUINAL HERNIA REPAIR Left 1997   INGUINAL HERNIA REPAIR Bilateral 10/24/2022   Procedure: LAPAROSCOPIC BILATERAL INGUINAL HERNIA REPAIR WITH MESH;  Surgeon: Axel Filler, MD;  Location: [facility];  Service: General;  Laterality: Bilateral;   LEFT HEART CATH AND CORONARY ANGIOGRAPHY N/A 05/24/2022   Procedure: LEFT HEART CATH AND CORONARY ANGIOGRAPHY;  Surgeon: Corky Crafts, MD;  Location: [facility] INVASIVE CV LAB;  Service: Cardiovascular;  Laterality: N/A;   PACEMAKER IMPLANT N/A 08/24/2023   Procedure: PACEMAKER IMPLANT;  Surgeon: Marinus Maw, MD;  Location: [facility] INVASIVE CV LAB;  Service: Cardiovascular;  Laterality: N/A;   TONSILLECTOMY       reports that he quit smoking about 11 years ago. His smoking use included cigarettes and e-cigarettes. He has never used smokeless tobacco. He reports current alcohol use. He reports that he does not use drugs.  No Known Allergies  Family History  Problem Relation Age of Onset   Alzheimer's disease Mother    Heart disease Father        CHF   Stroke Father 50   Tremor Father    Tremor Sister    Pulmonary embolism Brother    Diabetes Brother    Heart disease Paternal Grandfather    Asthma Neg Hx    Colon cancer Neg Hx    Esophageal cancer Neg Hx    Pancreatic cancer Neg Hx    Prostate cancer Neg Hx    Rectal cancer Neg Hx    Stomach cancer Neg Hx     Prior to  Admission medications   Medication Sig Start Date End Date Taking? Authorizing Provider  acetaminophen (TYLENOL) 500 MG tablet Take 500 mg by mouth every 6 (six) hours as needed for moderate pain.   Yes [provider]  albuterol (VENTOLIN HFA) 108 (90 Base) MCG/ACT inhaler Inhale 2 puffs into the lungs every 6 (six) hours as needed for wheezing or shortness of breath.   Yes [provider]  aspirin EC 81 MG tablet Take 81 mg by mouth daily.   Yes [provider]  atorvastatin (LIPITOR) 40 MG tablet Take 1 tablet (40 mg total) by mouth daily. 08/26/23  Yes Zigmund Daniel., MD  carbidopa-levodopa (SINEMET IR) 25-100 MG tablet TAKE 1.5 TABLETS BY MOUTH 3 (THREE) TIMES DAILY. 6AM/10-11AM/3-4PM 08/16/23  Yes Tat, Octaviano Batty, DO  chlorthalidone (HYGROTON) 25 MG tablet TAKE 1 TABLET (25 MG TOTAL) BY MOUTH DAILY. 09/29/23  Yes Mliss Sax, MD  finasteride (PROSCAR) 5 MG tablet Take 1 tablet (5 mg total) by mouth daily. 05/10/23  Yes Mliss Sax, MD  insulin aspart (NOVOLOG) 100 UNIT/ML injection Inject 0-9 Units into the skin 3 (three) times daily with meals. CBG < 70:treat low blood sugar CBG 70 - 120: 0 units  CBG 121 - 150: 1 unit  CBG 151 - 200: 2 units  CBG 201 - 250: 3 units  CBG 251 - 300: 5 units  CBG 301 - 350: 7 units  CBG 351 - 400: 9 units  CBG > 400: call MD 08/25/23  Yes Zigmund Daniel., MD  JARDIANCE 10 MG TABS tablet Take 10 mg by mouth daily. 04/03/20  Yes [provider]  losartan (COZAAR) 100 MG tablet Take 1 tablet (100 mg total) by mouth daily. 10/11/23  Yes Mliss Sax, MD  Metamucil Fiber CHEW Chew 1 each by mouth daily.   Yes [provider]  metFORMIN (GLUCOPHAGE) 1000 MG tablet Take 1 tablet (1,000 mg total) by mouth 2 (two) times daily with a meal. 05/26/22  Yes Corky Crafts, MD  omeprazole (PRILOSEC) 40 MG capsule Take 1 capsule (40 mg total) by mouth daily. 05/10/23  Yes Mliss Sax, MD  tobramycin (TOBREX) 0.3 % ophthalmic solution Place 1 drop into the left eye daily as needed (to prevent infection when getting injection).   Yes [provider]  dorzolamide-timolol (COSOPT) 2-0.5 % ophthalmic solution Place 1 drop into the left eye 2 (two) times daily. Patient not taking: Reported on 11/24/2023    [provider]  Glucose 15 g PACK Take 15 g by mouth as needed (hypoglycemia).    [provider]  Misc. Devices MISC Rollator. G20.A2 11/16/23   Salvatore Decent, FNP    Physical Exam: Constitutional: Moderately built and nourished. Vitals:   11/24/23 0000 11/24/23 0200 11/24/23 0300 11/24/23 0400  BP: (!) 90/58  134/63 (!) 154/61  Pulse: 93 (!) 108 (!) 102 (!) 107  Resp: (!) 5 20 (!) 22 (!) 25  Temp:    98.2 F (36.8 C)  TempSrc:    Oral  SpO2: 98% 95% 100% 99%  Weight:      Height:       Eyes: Anicteric no pallor. ENMT: No discharge from the ears eyes nose or mouth. Neck: No mass felt.  No neck rigidity. Respiratory: No rhonchi or crepitations. Cardiovascular: S1-S2 heard. Abdomen: Soft nontender bowel sounds present. Musculoskeletal: No edema. Skin: No rash. Neurologic: Alert awake oriented to his time place and person.  Moves all extremities. Psychiatric: Appears normal.  Normal affect.   Labs on Admission: I have personally reviewed following labs and imaging studies  CBC: Recent Labs  Lab 11/23/23 2246  WBC 22.3*  NEUTROABS 18.5*  HGB 13.3  HCT 39.8  MCV 79.6*  PLT 253   Basic Metabolic Panel: Recent Labs  Lab 11/21/23 1516 11/23/23 2315  NA 129* 124*  K 3.8 4.4  CL 92* 90*  CO2 26 20*  GLUCOSE 86 186*  BUN 20 26*  CREATININE 1.13 1.49*  CALCIUM 10.2 9.8   GFR: Estimated Creatinine Clearance: 45.5 mL/min (A) (by C-G formula based on SCr of 1.49 mg/dL (H)). Liver Function Tests: Recent Labs  Lab 11/23/23 2315  AST 21  ALT <5  ALKPHOS 98  BILITOT 2.0*  PROT 6.3*  ALBUMIN 3.5   No  results for input(s): "LIPASE", "AMYLASE" in the last 168 hours. No results for input(s): "AMMONIA" in the last 168 hours. Coagulation Profile: Recent Labs  Lab 11/23/23 2248  INR 1.1   Cardiac Enzymes: No results for input(s): "CKTOTAL", "CKMB", "CKMBINDEX", "TROPONINI" in the last 168 hours. BNP (last 3 results) No results for input(s): "PROBNP" in the last 8760 hours. HbA1C: No results for input(s): "HGBA1C" in the last 72 hours. CBG: No results for input(s): "GLUCAP" in the last 168 hours. Lipid Profile: No results for input(s): "CHOL", "HDL", "LDLCALC", "TRIG", "CHOLHDL", "LDLDIRECT" in the last 72 hours. Thyroid Function Tests: No results for input(s): "TSH", "T4TOTAL", "FREET4", "T3FREE", "THYROIDAB" in the last 72 hours. Anemia Panel: No results for input(s): "VITAMINB12", "FOLATE", "FERRITIN", "TIBC", "IRON", "RETICCTPCT" in the last 72 hours. Urine analysis:    Component Value Date/Time   COLORURINE YELLOW 11/23/2023 2245   APPEARANCEUR HAZY (A) 11/23/2023 2245   LABSPEC 1.014 11/23/2023 2245   PHURINE 5.0 11/23/2023 2245   GLUCOSEU >=500 (A) 11/23/2023 2245   GLUCOSEU 500 (A) 11/21/2023 1516   HGBUR NEGATIVE 11/23/2023 2245   BILIRUBINUR NEGATIVE 11/23/2023 2245   KETONESUR 5 (A) 11/23/2023 2245   PROTEINUR NEGATIVE 11/23/2023 2245   UROBILINOGEN 0.2 11/21/2023 1516   NITRITE NEGATIVE 11/23/2023 2245   LEUKOCYTESUR TRACE (A) 11/23/2023 2245   Sepsis Labs: @LABRCNTIP (procalcitonin:4,lacticidven:4) ) Recent Results (from the past 240 hours)  Resp panel by RT-PCR (RSV, Flu A&B, Covid) Anterior Nasal Swab     Status: None   Collection Time: 11/23/23 10:48 PM   Specimen: Anterior Nasal Swab  Result Value Ref Range Status   SARS Coronavirus 2 by RT PCR NEGATIVE NEGATIVE Final   Influenza A by PCR NEGATIVE NEGATIVE Final   Influenza B by PCR NEGATIVE NEGATIVE Final    Comment: (NOTE) The Xpert Xpress SARS-CoV-2/FLU/RSV plus assay is intended as an aid in the  diagnosis of influenza from Nasopharyngeal swab specimens and should not be used as a sole basis for treatment. Nasal  washings and aspirates are unacceptable for Xpert Xpress SARS-CoV-2/FLU/RSV testing.  Fact Sheet for Patients: BloggerCourse.com  Fact Sheet for Healthcare Providers: SeriousBroker.it  This test is not yet approved or cleared by the [place] FDA and has been authorized for detection and/or diagnosis of SARS-CoV-2 by FDA under an Emergency Use Authorization (EUA). This EUA will remain in effect (meaning this test can be used) for the duration of the COVID-19 declaration under Section 564(b)(1) of the Act, 21 U.S.C. section 360bbb-3(b)(1), unless the authorization is terminated or revoked.     Resp Syncytial Virus by PCR NEGATIVE NEGATIVE Final    Comment: (NOTE) Fact Sheet for Patients: BloggerCourse.com  Fact Sheet for Healthcare Providers: SeriousBroker.it  This test is not yet approved or cleared by the [place] FDA and has been authorized for detection and/or diagnosis of SARS-CoV-2 by FDA under an Emergency Use Authorization (EUA). This EUA will remain in effect (meaning this test can be used) for the duration of the COVID-19 declaration under Section 564(b)(1) of the Act, 21 U.S.C. section 360bbb-3(b)(1), unless the authorization is terminated or revoked.  Performed at [facility], [address] [place], [place] [address]      Radiological Exams on Admission: DG Chest Port 1 View Result Date: 11/23/2023 CLINICAL DATA:  Fall, possible sepsis EXAM: PORTABLE CHEST 1 VIEW COMPARISON:  08/25/2023 FINDINGS: Left-sided pacing device as before. Scarring or atelectasis at the lingula and bilateral bases. No pleural effusion or pneumothorax. Normal cardiomediastinal silhouette with aortic atherosclerosis. IMPRESSION: No active disease. Scarring or  atelectasis at the lingula and bilateral bases. Electronically Signed   By: Jasmine Pang M.D.   On: 11/23/2023 23:10    EKG: Independently reviewed.  Tachycardia.  Assessment/Plan Principal Problem:   Sepsis (HCC) Active Problems:   Mixed hyperlipidemia   Essential hypertension   COPD (chronic obstructive pulmonary disease) (HCC)   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, bilateral (HCC)   Parkinson's disease (HCC)   AV block, 3rd degree (HCC)   UTI (urinary tract infection)   ARF (acute renal failure) (HCC)   Hyponatremia    Sepsis likely source could be UTI given the burning micturition.  Patient was tachycardic hypotensive with leukocytosis confusion consistent with sepsis physiology.  Given the persistent burning micturition I have ordered CT renal study.  Follow cultures.  Continue hydration.  Follow lactic acid. Acute renal failure with hyponatremia likely from dehydration and holding patient's home medications including losartan, chlorthalidone.  Continue with hydration follow metabolic panel closely.  Chlorthalidone probably contributing to the hyponatremia.  Urine sodium and osmolality TSH are pending. Frequent falls and weakness likely from dehydration and deconditioning.  CT head and C-spine are pending.  Once patient's sepsis improved will get physical therapy consult.  Medicine checking THS and CK levels.  If persist may consider neurology consult given history of Parkinson's disease. Acute metabolic encephalopathy has improved at the time of my exam.  Likely from sepsis.  Patient has been having off-and-on hallucination could be part of his Parkinson's disease.  CT head is pending.  In addition we will check TSH ammonia levels B12. Parkinsons disease on Sinemet. Diabetes mellitus type 2 last hemoglobin A1c was around 6.1 per patient patient takes NovoLog 70/30 30 units twice daily.  For now Patient on Lantus 15 units along with  sliding scale coverage. COPD mentioned in the chart.  No obvious wheezing. History of third-degree AV block status post pacemaker placement.  Will order pacemaker interrogation.  Hypertension holding ARB and chlorthalidone due to hypotension at presentation and also has acute renal failure hyponatremia.  Given the sepsis physiology patient will need close monitoring and more than 2 midnight stay and inpatient status.   DVT prophylaxis: Lovenox. Code Status: DNR confirmed with patient's son. Family Communication: Patient's son. Disposition Plan: Progressive care. Consults called: None. Admission status: Patient.

## 2023-11-24 NOTE — Assessment & Plan Note (Addendum)
Patient had softer blood pressure on admission so home losartan and chlorthalidone was held. -Blood pressure currently within goal -We will resume home losartan as appropriate -Home chlorthalidone should be discontinued due to hyponatremia

## 2023-11-24 NOTE — Assessment & Plan Note (Signed)
-  Continue home Lipitor 

## 2023-11-24 NOTE — Progress Notes (Signed)
Patient in [facility] to advise if scheduling is needed.   Gwenevere Ghazi  [facility]  [facility]  Direct Dial: [phone]  Fax [phone]

## 2023-11-24 NOTE — Hospital Course (Addendum)
Taken from H&P.  Karl Lawson. is a 78 y.o. male with history of Parkinson's disease, diabetes mellitus type 2, complete heart block status post pacemaker placement, COPD, hypertension was brought to the ER by patient's son after patient was having increasing confusion weakness and complaining of increasing burning micturition.  Patient has been having frequent falls and had come to the ER on 11/16/2023 when the scans showed nothing acute.  Patient was hydrated and discharged home.  Subsequent which patient has followed up with his primary care physician and also urologist.   On presentation patient has softer blood pressure, tachycardic and tachypneic,  Blood pressure improved with IV fluid.labs with leukocytosis at 22,000, UA concerning for UTI.  Sodium 124, BUN 26, creatinine 1.49, T. bili 2, lactic acid 2 and subsequent 2.6.  COVID and flu PCR were negative.  Chest x-ray with no active disease.  Some scarring or atelectasis at the lingula and bilateral bases. Blood and urine cultures were drawn and patient was started on on ceftriaxone.  1/17; vitals with mild tachycardia and tachypnea, remained on room air.  Repeat labs with slightly improving leukocytosis and renal function.  B12 levels low at 55-ordered IM replacement, sodium with some improvement 127, hyponatremia labs still pending.  Preliminary blood cultures negative, urine cultures pending. 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  Also noted BPH, cholelithiasis and diverticulosis without any sign of infection.  Starting on Flomax. Obtained postvoid bladder scan which was negative for any significant retention.  1/18: Patient was quite somnolent this morning, received Haldol and Ativan overnight due to excessive agitation.  Creatinine again increased to 1.86, sodium decreased to 124, improving leukocytosis, urine cultures with gram-negative rods, did not eat or drink  anything since yesterday.  Giving some more IV fluid.  1/19: Vital stable, urine cultures with Klebsiella pneumonia, CBG elevated, corrected sodium at 129, potassium 3.4, worsening creatinine at 2.4.  Renal ultrasound today with renal pelvis fullness, concerning for nephritis.  No obvious hydronephrosis or obstruction.  Nephrology was also consulted. Improving leukocytosis.  1/20: Vital stable, UOP of 2450 recorded.  Sensitivities for Klebsiella pneumonia came back as ESBL, only sensitive to carbapenem, ceftriaxone was switched to meropenem yesterday evening. Patient likely will need 7-day course.  Foley catheter was placed yesterday at nephrology request.  Improving renal functions today. We should try voiding trial in 1 to 2 days. PT is recommending SNF.

## 2023-11-24 NOTE — Assessment & Plan Note (Signed)
-

## 2023-11-24 NOTE — Assessment & Plan Note (Signed)
Patient was on 70 over 3030 units twice daily at home. Recent A1c of 6.1 -Continue with Lantus and SSI

## 2023-11-24 NOTE — Assessment & Plan Note (Signed)
Found to have significantly low B12 at 55. -Ordered IM supplement

## 2023-11-25 DIAGNOSIS — G20A2 Parkinson's disease without dyskinesia, with fluctuations: Secondary | ICD-10-CM

## 2023-11-25 DIAGNOSIS — N179 Acute kidney failure, unspecified: Secondary | ICD-10-CM | POA: Diagnosis not present

## 2023-11-25 DIAGNOSIS — I442 Atrioventricular block, complete: Secondary | ICD-10-CM

## 2023-11-25 DIAGNOSIS — A419 Sepsis, unspecified organism: Secondary | ICD-10-CM | POA: Diagnosis not present

## 2023-11-25 DIAGNOSIS — E871 Hypo-osmolality and hyponatremia: Secondary | ICD-10-CM | POA: Diagnosis not present

## 2023-11-25 DIAGNOSIS — I10 Essential (primary) hypertension: Secondary | ICD-10-CM

## 2023-11-25 DIAGNOSIS — J410 Simple chronic bronchitis: Secondary | ICD-10-CM

## 2023-11-25 DIAGNOSIS — Z794 Long term (current) use of insulin: Secondary | ICD-10-CM

## 2023-11-25 DIAGNOSIS — R296 Repeated falls: Secondary | ICD-10-CM

## 2023-11-25 DIAGNOSIS — E113593 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, bilateral: Secondary | ICD-10-CM

## 2023-11-25 DIAGNOSIS — N3000 Acute cystitis without hematuria: Secondary | ICD-10-CM | POA: Diagnosis not present

## 2023-11-25 LAB — CBC
HCT: 35.6 % — ABNORMAL LOW (ref 39.0–52.0)
Hemoglobin: 12.5 g/dL — ABNORMAL LOW (ref 13.0–17.0)
MCH: 26.9 pg (ref 26.0–34.0)
MCHC: 35.1 g/dL (ref 30.0–36.0)
MCV: 76.7 fL — ABNORMAL LOW (ref 80.0–100.0)
Platelets: 251 10*3/uL (ref 150–400)
RBC: 4.64 MIL/uL (ref 4.22–5.81)
RDW: 16.3 % — ABNORMAL HIGH (ref 11.5–15.5)
WBC: 16.1 10*3/uL — ABNORMAL HIGH (ref 4.0–10.5)
nRBC: 0 % (ref 0.0–0.2)

## 2023-11-25 LAB — GLUCOSE, CAPILLARY
Glucose-Capillary: 243 mg/dL — ABNORMAL HIGH (ref 70–99)
Glucose-Capillary: 204 mg/dL — ABNORMAL HIGH (ref 70–99)
Glucose-Capillary: 212 mg/dL — ABNORMAL HIGH (ref 70–99)
Glucose-Capillary: 208 mg/dL — ABNORMAL HIGH (ref 70–99)

## 2023-11-25 LAB — BASIC METABOLIC PANEL
Anion gap: 14 (ref 5–15)
BUN: 29 mg/dL — ABNORMAL HIGH (ref 8–23)
CO2: 20 mmol/L — ABNORMAL LOW (ref 22–32)
Calcium: 9.4 mg/dL (ref 8.9–10.3)
Chloride: 90 mmol/L — ABNORMAL LOW (ref 98–111)
Creatinine, Ser: 1.86 mg/dL — ABNORMAL HIGH (ref 0.61–1.24)
GFR, Estimated: 37 mL/min — ABNORMAL LOW (ref >60)
Glucose, Bld: 229 mg/dL — ABNORMAL HIGH (ref 70–99)
Potassium: 3.6 mmol/L (ref 3.5–5.1)
Sodium: 124 mmol/L — ABNORMAL LOW (ref 135–145)

## 2023-11-25 MED ORDER — SODIUM CHLORIDE 0.9 % IV SOLN: INTRAVENOUS | Status: AC

## 2023-11-25 MED ORDER — LORAZEPAM 2 MG/ML IJ SOLN
0.5000 mg | Freq: Once | INTRAMUSCULAR | Status: AC | PRN
  Administered 2023-11-25: 0.5 mg via INTRAVENOUS
  Filled 2023-11-25: qty 1

## 2023-11-25 NOTE — Assessment & Plan Note (Signed)
Seems like having CKD stage IIIa.  Creatinine with some worsening again after improvement.  Likely due to poor p.o. intake -Giving some gentle fluid -Monitor renal function -Avoid nephrotoxins

## 2023-11-25 NOTE — Assessment & Plan Note (Signed)
Patient met sepsis criteria with leukocytosis, tachycardia and tachypnea.  Likely due to UTI.  CT renal stone study with mild bilateral hydronephrosis and some bladder distention.  Bladder scan with no significant retention.  Also noted some bladder wall thickening and small amount of air along the right lateral wall consistent with emphysematous cystitis. Also noted cholelithiasis and colonic diverticulosis with no sign of infection.  Prostate hypertrophy was also noted. Urine cultures with gram-negative rods -Continue with ceftriaxone -Follow-up final culture results

## 2023-11-25 NOTE — Evaluation (Addendum)
Physical Therapy Evaluation Patient Details Name: Karl Lawson. MRN: [id number] DOB: August 22, 1946 Today's Date: 11/25/2023  History of Present Illness  78 yo male presents to [facility] on 11/23/23 w/ more frequent falls and burning micturition. Admitted w/ sepsis likely 2/2 UTI, acute renal failure, and hyponatremia. CT head and cervical spine neg. PMHx: AKI, PMH includes obesity, DM2, HTN, HLD, COPD, GERD, Parkinson disease.   Clinical Impression  Pt in bed upon arrival with son present and agreeable to PT eval. Prior to admit, pt had multiple falls where son reports his legs give out. Son has noticed increased LE weakness and unsteadiness for weeks leading up to admission. Pt was using a RW for short distance ambulation. Limited PT eval as pt had decreased alertness throughout session that did not improve while seated at EOB. Pt required MaxAx2 to totalAx2 for bed mobility. Once seated EOB, pt required MinA to MaxA to maintain seated balance as pt leaned posteriorly. Pt was totalAx2 with bed pad to laterally scoot towards HOB. Pt presents to therapy session with decreased LE strength, balance, and mobility. Pt would benefit from acute skilled PT to address functional impairments. Recommending post-acute rehab <3 hrs to work towards independence with mobility. Acute PT to follow.          If plan is discharge home, recommend the following: A lot of help with walking and/or transfers;A lot of help with bathing/dressing/bathroom;Assistance with cooking/housework;Direct supervision/assist for medications management;Direct supervision/assist for financial management;Assist for transportation;Help with stairs or ramp for entrance   Can travel by private vehicle   No    Equipment Recommendations Other (comment) (TBD at next venue)     Functional Status Assessment Patient has had a recent decline in their functional status and demonstrates the ability to make significant improvements in function in a  reasonable and predictable amount of time.     Precautions / Restrictions Precautions Precautions: Fall Restrictions Weight Bearing Restrictions Per Provider Order: No      Mobility  Bed Mobility Overal bed mobility: Needs Assistance Bed Mobility: Supine to Sit, Sit to Supine     Supine to sit: Max assist, +2 for physical assistance, +2 for safety/equipment, HOB elevated Sit to supine: Total assist, +2 for physical assistance, +2 for safety/equipment, HOB elevated   General bed mobility comments: MaxAx2 for sup/sit, pt able to minimally assist with raising trunk. TotalAx2 for lateral scoot towards HOB w/ bed pad. TotalAx2 for return to supine w/ helicopter method and bed pad    Transfers      General transfer comment: deferred 2/2 LOC       Balance Overall balance assessment: Needs assistance, Mild deficits observed, not formally tested, History of Falls Sitting-balance support: Bilateral upper extremity supported, Feet supported Sitting balance-Leahy Scale: Poor Sitting balance - Comments: Required MinA to MaxA for seated balance. Less assistance needed with holding onto rail at end of bed Postural control: Posterior lean       Pertinent Vitals/Pain Pain Assessment Pain Assessment: No/denies pain    Home Living Family/patient expects to be discharged to:: Private residence Living Arrangements: Alone Available Help at Discharge: Family;Available PRN/intermittently Type of Home: House Home Access: Level entry     Alternate Level Stairs-Number of Steps: 4 steps, 12 steps to upstairs Home Layout: Two level Home Equipment: Agricultural consultant (2 wheels);Shower seat;Wheelchair - manual      Prior Function Prior Level of Function : Independent/Modified Independent;Driving    Mobility Comments: has been using RW recently ADLs Comments: ind,  but takes time, son refills pillbox     Extremity/Trunk Assessment   Upper Extremity Assessment Upper Extremity Assessment:  Defer to OT evaluation    Lower Extremity Assessment Lower Extremity Assessment: Generalized weakness    Cervical / Trunk Assessment Cervical / Trunk Assessment: Normal  Communication   Communication Communication: Hearing impairment Cueing Techniques: Verbal cues;Tactile cues  Cognition Arousal: Obtunded Behavior During Therapy: Flat affect Overall Cognitive Status: Difficult to assess    General Comments: pt would intermittently follow commands, however, was very lethargic and would not open his eyes. Alertness did not improve upon sitting EOB        General Comments General comments (skin integrity, edema, etc.): VSS on RA, son present throughout session and answered questions     PT Assessment Patient needs continued PT services  PT Problem List Decreased strength;Decreased range of motion;Decreased activity tolerance;Decreased balance;Decreased mobility;Decreased coordination;Decreased cognition;Decreased safety awareness       PT Treatment Interventions DME instruction;Gait training;Stair training;Functional mobility training;Therapeutic activities;Therapeutic exercise;Balance training;Neuromuscular re-education;Wheelchair mobility training;Patient/family education    PT Goals (Current goals can be found in the Care Plan section)  Acute Rehab PT Goals Patient Stated Goal: unable to participate PT Goal Formulation: Patient unable to participate in goal setting Time For Goal Achievement: 12/09/23 Potential to Achieve Goals: Fair    Frequency Min 1X/week        AM-PAC PT "6 Clicks" Mobility  Outcome Measure Help needed turning from your back to your side while in a flat bed without using bedrails?: Total Help needed moving from lying on your back to sitting on the side of a flat bed without using bedrails?: Total Help needed moving to and from a bed to a chair (including a wheelchair)?: Total Help needed standing up from a chair using your arms (e.g., wheelchair  or bedside chair)?: Total Help needed to walk in hospital room?: Total Help needed climbing 3-5 steps with a railing? : Total 6 Click Score: 6    End of Session   Activity Tolerance: Patient limited by fatigue;Patient limited by lethargy Patient left: in bed;with call bell/phone within reach;with bed alarm set;with family/visitor present Nurse Communication: Mobility status PT Visit Diagnosis: Repeated falls (R29.6);Muscle weakness (generalized) (M62.81);Unsteadiness on feet (R26.81)    Time: 4098-1191 PT Time Calculation (min) (ACUTE ONLY): 17 min   Charges:   PT Evaluation $PT Eval Moderate Complexity: 1 Mod   PT General Charges $$ ACUTE PT VISIT: 1 Visit        Hilton Cork, PT, DPT Secure Chat Preferred  Rehab Office [phone]   Arturo Morton Brion Aliment 11/25/2023, 2:26 PM

## 2023-11-25 NOTE — Evaluation (Signed)
Occupational Therapy Evaluation Patient Details Name: Karl Lawson. MRN: [id number] DOB: 05/15/1946 Today's Date: 11/25/2023   History of Present Illness 78 yo male presents to [facility] on 11/23/23 w/ more frequent falls and burning micturition. Admitted w/ sepsis likely 2/2 UTI, acute renal failure, and hyponatremia. CT head and cervical spine neg. PMHx: AKI, PMH includes obesity, DM2, HTN, HLD, COPD, GERD, Parkinson disease.   Clinical Impression   Pt uses RW at baseline, is ind with ADL but has needed incr time as of lately per family, lives alone. Pt needing up to max A for ADLs, max-total A +2 for bed mobility, transfers deferred at this time as pt lethargic, keeping eyes closed. Pt able to sit EOB x5 min for UE/LE ROM, follows commands with incr time. Pt presenting with impairments listed below, will follow acutely. Patient will benefit from continued inpatient follow up therapy, <3 hours/day to maximize safety/ind with ADL/functional mobility.       If plan is discharge home, recommend the following: Two people to help with walking and/or transfers;A lot of help with bathing/dressing/bathroom;Assistance with cooking/housework;Direct supervision/assist for financial management;Direct supervision/assist for medications management;Assist for transportation;Help with stairs or ramp for entrance    Functional Status Assessment  Patient has had a recent decline in their functional status and demonstrates the ability to make significant improvements in function in a reasonable and predictable amount of time.  Equipment Recommendations  Other (comment) (defer)    Recommendations for Other Services PT consult     Precautions / Restrictions Precautions Precautions: Fall Restrictions Weight Bearing Restrictions Per Provider Order: No      Mobility Bed Mobility Overal bed mobility: Needs Assistance Bed Mobility: Supine to Sit, Sit to Supine     Supine to sit: Max assist, +2 for  physical assistance, +2 for safety/equipment, HOB elevated Sit to supine: Total assist, +2 for physical assistance, +2 for safety/equipment, HOB elevated   General bed mobility comments: MaxAx2 for sup/sit, pt able to minimally assist with raising trunk. TotalAx2 for lateral scoot towards HOB w/ bed pad. TotalAx2 for return to supine w/ helicopter method and bed pad    Transfers                   General transfer comment: deferred 2/2 lethargy, inability to follow commands      Balance Overall balance assessment: Needs assistance, Mild deficits observed, not formally tested, History of Falls Sitting-balance support: Bilateral upper extremity supported, Feet supported Sitting balance-Leahy Scale: Poor Sitting balance - Comments: Required MinA to MaxA for seated balance. Less assistance needed with holding onto rail at end of bed Postural control: Posterior lean                                 ADL either performed or assessed with clinical judgement   ADL Overall ADL's : Needs assistance/impaired Eating/Feeding: Set up   Grooming: Set up   Upper Body Bathing: Moderate assistance   Lower Body Bathing: Maximal assistance   Upper Body Dressing : Moderate assistance   Lower Body Dressing: Maximal assistance   Toilet Transfer: Maximal assistance;+2 for physical assistance   Toileting- Clothing Manipulation and Hygiene: Maximal assistance       Functional mobility during ADLs: Maximal assistance;+2 for physical assistance       Vision   Additional Comments: pt did not open eyes, will further assess     Perception Perception: Not tested  Praxis Praxis: Not tested       Pertinent Vitals/Pain Pain Assessment Pain Assessment: No/denies pain     Extremity/Trunk Assessment Upper Extremity Assessment Upper Extremity Assessment: Generalized weakness;Difficult to assess due to impaired cognition (tremoring movements, hx of parkinson's disease)    Lower Extremity Assessment Lower Extremity Assessment: Defer to PT evaluation   Cervical / Trunk Assessment Cervical / Trunk Assessment: Normal   Communication Communication Communication: Hearing impairment Cueing Techniques: Verbal cues;Tactile cues   Cognition Arousal: Obtunded, Suspect due to medications Behavior During Therapy: Flat affect Overall Cognitive Status: Difficult to assess                                 General Comments: pt would intermittently follow commands, however, was very lethargic and would not open his eyes. Alertness did not improve upon sitting EOB     General Comments  VSS On RA    Exercises     Shoulder Instructions      Home Living Family/patient expects to be discharged to:: Private residence Living Arrangements: Alone Available Help at Discharge: Family;Available PRN/intermittently Type of Home: House Home Access: Level entry     Home Layout: Two level Alternate Level Stairs-Number of Steps: 4 steps, 12 steps to upstairs   Bathroom Shower/Tub: Walk-in shower         Home Equipment: Agricultural consultant (2 wheels);Shower seat;Wheelchair - manual          Prior Functioning/Environment Prior Level of Function : Independent/Modified Independent;Driving             Mobility Comments: has been using RW recently ADLs Comments: ind, but takes time, son refills pillbox        OT Problem List: Decreased strength;Decreased range of motion;Decreased activity tolerance;Impaired balance (sitting and/or standing);Decreased safety awareness;Decreased cognition      OT Treatment/Interventions: Self-care/ADL training;Therapeutic exercise;Energy conservation;DME and/or AE instruction;Therapeutic activities;Patient/family education;Balance training    OT Goals(Current goals can be found in the care plan section) Acute Rehab OT Goals Patient Stated Goal: none stated OT Goal Formulation: With patient Time For Goal Achievement:  12/09/23 Potential to Achieve Goals: Good ADL Goals Pt Will Perform Upper Body Dressing: sitting;with contact guard assist Pt Will Perform Lower Body Dressing: with contact guard assist;sitting/lateral leans;sit to/from stand Pt Will Transfer to Toilet: with contact guard assist;ambulating;regular height toilet  OT Frequency: Min 1X/week    Co-evaluation PT/OT/SLP Co-Evaluation/Treatment: Yes Reason for Co-Treatment: Complexity of the patient's impairments (multi-system involvement);Necessary to address cognition/behavior during functional activity;For patient/therapist safety   OT goals addressed during session: ADL's and self-care      AM-PAC OT "6 Clicks" Daily Activity     Outcome Measure Help from another person eating meals?: A Lot Help from another person taking care of personal grooming?: A Lot Help from another person toileting, which includes using toliet, bedpan, or urinal?: A Lot Help from another person bathing (including washing, rinsing, drying)?: A Lot Help from another person to put on and taking off regular upper body clothing?: A Lot Help from another person to put on and taking off regular lower body clothing?: A Lot 6 Click Score: 12   End of Session Nurse Communication: Mobility status  Activity Tolerance: Patient tolerated treatment well Patient left: in bed;with call bell/phone within reach;with bed alarm set;with family/visitor present;with nursing/sitter in room  OT Visit Diagnosis: Unsteadiness on feet (R26.81);Other abnormalities of gait and mobility (R26.89);Muscle weakness (generalized) (M62.81);History of falling (  Z91.81)                Time: 1610-9604 OT Time Calculation (min): 16 min Charges:  OT General Charges $OT Visit: 1 Visit OT Evaluation $OT Eval Moderate Complexity: 1 Mod  Kaylon Laroche K, OTD, OTR/L SecureChat Preferred Acute Rehab [phone]   Carver Fila Koonce 11/25/2023, 3:38 PM

## 2023-11-25 NOTE — Assessment & Plan Note (Signed)
Patient had softer blood pressure on admission so home losartan and chlorthalidone was held. -Blood pressure currently within goal -We will resume home losartan as appropriate -Home chlorthalidone should be discontinued due to hyponatremia

## 2023-11-25 NOTE — Progress Notes (Signed)
Progress Note   Patient: Karl Lawson. MWU:[id number] DOB: 09-Sep-1946 DOA: 11/23/2023     1 DOS: the patient was seen and examined on 11/25/2023   Brief hospital course: Taken from H&P.  Karl Lawson. is a 78 y.o. male with history of Parkinson's disease, diabetes mellitus type 2, complete heart block status post pacemaker placement, COPD, hypertension was brought to the ER by patient's son after patient was having increasing confusion weakness and complaining of increasing burning micturition.  Patient has been having frequent falls and had come to the ER on 11/16/2023 when the scans showed nothing acute.  Patient was hydrated and discharged home.  Subsequent which patient has followed up with his primary care physician and also urologist.   On presentation patient has softer blood pressure, tachycardic and tachypneic,  Blood pressure improved with IV fluid.labs with leukocytosis at 22,000, UA concerning for UTI.  Sodium 124, BUN 26, creatinine 1.49, T. bili 2, lactic acid 2 and subsequent 2.6.  COVID and flu PCR were negative.  Chest x-ray with no active disease.  Some scarring or atelectasis at the lingula and bilateral bases. Blood and urine cultures were drawn and patient was started on on ceftriaxone.  1/17; vitals with mild tachycardia and tachypnea, remained on room air.  Repeat labs with slightly improving leukocytosis and renal function.  B12 levels low at 55-ordered IM replacement, sodium with some improvement 127, hyponatremia labs still pending.  Preliminary blood cultures negative, urine cultures pending. 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  Also noted BPH, cholelithiasis and diverticulosis without any sign of infection.  Starting on Flomax. Obtained postvoid bladder scan which was negative for any significant retention.  1/18: Patient was quite somnolent this morning, received Haldol and Ativan  overnight due to excessive agitation.  Creatinine again increased to 1.86, sodium decreased to 124, improving leukocytosis, urine cultures with gram-negative rods, did not eat or drink anything since yesterday.  Giving some more IV fluid   Assessment and Plan: * Sepsis [facility]) Patient met sepsis criteria with leukocytosis, tachycardia and tachypnea.  Likely due to UTI.  CT renal stone study with mild bilateral hydronephrosis and some bladder distention.  Bladder scan with no significant retention.  Also noted some bladder wall thickening and small amount of air along the right lateral wall consistent with emphysematous cystitis. Also noted cholelithiasis and colonic diverticulosis with no sign of infection.  Prostate hypertrophy was also noted. Urine cultures with gram-negative rods -Continue with ceftriaxone -Follow-up final culture results  UTI (urinary tract infection) Urine cultures with gram-negative rod.  History of recurrent UTI -Continue with ceftriaxone  ARF (acute renal failure) (HCC) Seems like having CKD stage IIIa.  Creatinine with some worsening again after improvement.  Likely due to poor p.o. intake -Giving some gentle fluid -Monitor renal function -Avoid nephrotoxins  Hyponatremia Sodium to 127>>124.  Patient was on chlorthalidone at home which should be discontinued on discharge. Hyponatremia labs with normal urine osmolality and urine sodium of 42. -Giving some more normal saline -Monitor sodium  Frequent falls And worsening weakness, likely due to poor p.o. intake and physical deconditioning.  CT head and cervical spine was negative for any acute abnormality. 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 -No acute concern  COPD (chronic obstructive pulmonary disease) (HCC) No concern of exacerbation. -Continue as needed bronchodilator  Essential hypertension Patient had softer blood pressure on admission so home losartan  and  chlorthalidone was held. 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 Recent A1c of 6.1 -Continue with Lantus and SSI  B12 deficiency Found to have significantly low B12 at 55. -Ordered IM supplement   Subjective: Patient was quite somnolent when seen today.  Denies any pain.  Son at bedside.  Has not eaten anything since yesterday.  Nursing concern of overnight agitation.  Physical Exam: Vitals:   11/24/23 2330 11/24/23 2350 11/25/23 0433 11/25/23 0819  BP: (!) 149/50 139/67 (!) 131/52 (!) 106/91  Pulse:  66 60 74  Resp: (!) 26 (!) 22 (!) 21 18  Temp:  99.7 F (37.6 C) 99.9 F (37.7 C) 97.7 F (36.5 C)  TempSrc:  Axillary    SpO2:  96% 94% 93%  Weight:      Height:       General.  Frail elderly man, in no acute distress. Pulmonary.  Lungs clear bilaterally, normal respiratory effort. CV.  Regular rate and rhythm, no JVD, rub or murmur. Abdomen.  Soft, nontender, nondistended, BS positive. CNS.  Somnolent, arousable, no apparent deficit Extremities.  No edema, no cyanosis, pulses intact and symmetrical.    Data Reviewed: Prior data reviewed  Family Communication: Discussed with son at bedside  Disposition: Status is: Inpatient Remains inpatient appropriate because: Severity of illness  Planned Discharge Destination: SNF  DVT prophylaxis.  Lovenox Time spent: 50 minutes  This record has been created using Conservation officer, historic buildings. Errors have been sought and corrected,but may not always be located. Such creation errors do not reflect on the standard of care.   Author: Arnetha Courser, MD 11/25/2023  2:59 PM  For on call review [email or web address].

## 2023-11-25 NOTE — Assessment & Plan Note (Signed)
Urine cultures with pretty resistant ESBL Klebsiella pneumonia, only show sensitivity to Carbapenem. -Antibiotics switched with meropenem

## 2023-11-25 NOTE — Assessment & Plan Note (Signed)
And worsening weakness, likely due to poor p.o. intake and physical deconditioning.  CT head and cervical spine was negative for any acute abnormality. -PT/OT is recommending SNF

## 2023-11-25 NOTE — Assessment & Plan Note (Signed)
Sodium to 127>>124>>129>>133.  Patient was on chlorthalidone at home which should be discontinued on discharge. Hyponatremia labs with normal urine osmolality and urine sodium of 42. -Responded to normal saline -Monitor sodium

## 2023-11-25 NOTE — Progress Notes (Signed)
Pt has become mvery anxious, having visual hallucinations , attempting to continuously get out of bed, pt experiencing DOE with these episodes. Provider informed of same, awaiting feed back

## 2023-11-25 NOTE — Plan of Care (Signed)
Pt alert x2-3, ac/hs, RW 18g with NS@75 , tele-paced

## 2023-11-26 ENCOUNTER — Inpatient Hospital Stay (HOSPITAL_COMMUNITY): Payer: Medicare Other

## 2023-11-26 DIAGNOSIS — N3000 Acute cystitis without hematuria: Secondary | ICD-10-CM | POA: Diagnosis not present

## 2023-11-26 DIAGNOSIS — E871 Hypo-osmolality and hyponatremia: Secondary | ICD-10-CM | POA: Diagnosis not present

## 2023-11-26 DIAGNOSIS — A419 Sepsis, unspecified organism: Secondary | ICD-10-CM | POA: Diagnosis not present

## 2023-11-26 DIAGNOSIS — N179 Acute kidney failure, unspecified: Secondary | ICD-10-CM | POA: Diagnosis not present

## 2023-11-26 LAB — BASIC METABOLIC PANEL
Anion gap: 12 (ref 5–15)
BUN: 37 mg/dL — ABNORMAL HIGH (ref 8–23)
CO2: 20 mmol/L — ABNORMAL LOW (ref 22–32)
Calcium: 8.9 mg/dL (ref 8.9–10.3)
Chloride: 95 mmol/L — ABNORMAL LOW (ref 98–111)
Creatinine, Ser: 2.24 mg/dL — ABNORMAL HIGH (ref 0.61–1.24)
GFR, Estimated: 29 mL/min — ABNORMAL LOW (ref >60)
Glucose, Bld: 209 mg/dL — ABNORMAL HIGH (ref 70–99)
Potassium: 3.4 mmol/L — ABNORMAL LOW (ref 3.5–5.1)
Sodium: 127 mmol/L — ABNORMAL LOW (ref 135–145)

## 2023-11-26 LAB — URINE CULTURE: Culture: >=100000 — AB

## 2023-11-26 LAB — CBC
HCT: 34.1 % — ABNORMAL LOW (ref 39.0–52.0)
Hemoglobin: 11.6 g/dL — ABNORMAL LOW (ref 13.0–17.0)
MCH: 26.5 pg (ref 26.0–34.0)
MCHC: 34 g/dL (ref 30.0–36.0)
MCV: 77.9 fL — ABNORMAL LOW (ref 80.0–100.0)
Platelets: 212 10*3/uL (ref 150–400)
RBC: 4.38 MIL/uL (ref 4.22–5.81)
RDW: 16.2 % — ABNORMAL HIGH (ref 11.5–15.5)
WBC: 11.6 10*3/uL — ABNORMAL HIGH (ref 4.0–10.5)
nRBC: 0 % (ref 0.0–0.2)

## 2023-11-26 LAB — GLUCOSE, CAPILLARY
Glucose-Capillary: 195 mg/dL — ABNORMAL HIGH (ref 70–99)
Glucose-Capillary: 219 mg/dL — ABNORMAL HIGH (ref 70–99)
Glucose-Capillary: 197 mg/dL — ABNORMAL HIGH (ref 70–99)
Glucose-Capillary: 183 mg/dL — ABNORMAL HIGH (ref 70–99)

## 2023-11-26 MED ORDER — SODIUM CHLORIDE 0.9 % IV SOLN
1.0000 g | Freq: Two times a day (BID) | INTRAVENOUS | Status: DC
Start: 2023-11-26 — End: 2023-11-28
  Administered 2023-11-26 – 2023-11-28 (×5): 1 g via INTRAVENOUS
  Filled 2023-11-26 (×6): qty 20

## 2023-11-26 NOTE — Progress Notes (Signed)
Progress Note   Patient: Karl Lawson. WUJ:[id number] DOB: [birth year] DOA: 11/23/2023     2 DOS: the patient was seen and examined on 11/26/2023   Brief hospital course: Taken from H&P.  Karl Nee. is a 78 y.o. male with history of Parkinson's disease, diabetes mellitus type 2, complete heart block status post pacemaker placement, COPD, hypertension was brought to the ER by patient's son after patient was having increasing confusion weakness and complaining of increasing burning micturition.  Patient has been having frequent falls and had come to the ER on 11/16/2023 when the scans showed nothing acute.  Patient was hydrated and discharged home.  Subsequent which patient has followed up with his primary care physician and also urologist.   On presentation patient has softer blood pressure, tachycardic and tachypneic,  Blood pressure improved with IV fluid.labs with leukocytosis at 22,000, UA concerning for UTI.  Sodium 124, BUN 26, creatinine 1.49, T. bili 2, lactic acid 2 and subsequent 2.6.  COVID and flu PCR were negative.  Chest x-ray with no active disease.  Some scarring or atelectasis at the lingula and bilateral bases. Blood and urine cultures were drawn and patient was started on on ceftriaxone.  1/17; vitals with mild tachycardia and tachypnea, remained on room air.  Repeat labs with slightly improving leukocytosis and renal function.  B12 levels low at 55-ordered IM replacement, sodium with some improvement 127, hyponatremia labs still pending.  Preliminary blood cultures negative, urine cultures pending. 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  Also noted BPH, cholelithiasis and diverticulosis without any sign of infection.  Starting on Flomax. Obtained postvoid bladder scan which was negative for any significant retention.  1/18: Patient was quite somnolent this morning, received Haldol and Ativan  overnight due to excessive agitation.  Creatinine again increased to 1.86, sodium decreased to 124, improving leukocytosis, urine cultures with gram-negative rods, did not eat or drink anything since yesterday.  Giving some more IV fluid.  1/19: Vital stable, urine cultures with Klebsiella pneumonia, CBG elevated, corrected sodium at 129, potassium 3.4, worsening creatinine at 2.4.  Renal ultrasound today with renal pelvis fullness, concerning for nephritis.  No obvious hydronephrosis or obstruction.  Nephrology was also consulted. Improving leukocytosis.   Assessment and Plan: * Sepsis [facility]) Patient met sepsis criteria with leukocytosis, tachycardia and tachypnea.  Likely due to UTI.  CT renal stone study with mild bilateral hydronephrosis and some bladder distention.  Bladder scan with no significant retention.  Also noted some bladder wall thickening and small amount of air along the right lateral wall consistent with emphysematous cystitis. Also noted cholelithiasis and colonic diverticulosis with no sign of infection.  Prostate hypertrophy was also noted. Urine cultures with Klebsiella pneumonia-pending susceptibility Renal ultrasound today with concern of bilateral nephritis -Continue with ceftriaxone-likely will need later prolonged antibiotics due to recurrent infection -Follow-up final culture results  UTI (urinary tract infection) Urine cultures with Klebsiella pneumonia-pending susceptibility history of recurrent UTI -Continue with ceftriaxone  ARF (acute renal failure) (HCC) Seems like having CKD stage IIIa.  Creatinine continued to get worse with decreased urine production despite getting IV fluid.  Renal ultrasound today with concern of renal pelvis fullness but no obvious obstruction.  Nephrology was also consulted -Strict intake and output -Monitor renal function -Avoid nephrotoxins  Hyponatremia Sodium to 127>>124>>129.  Patient was on chlorthalidone at home which should be  discontinued on discharge. Hyponatremia labs with normal urine osmolality and  urine sodium of 42. -Giving some more normal saline -Monitor sodium  Frequent falls And worsening weakness, likely due to poor p.o. intake and physical deconditioning.  CT head and cervical spine was negative for any acute abnormality. 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 -No acute concern  COPD (chronic obstructive pulmonary disease) (HCC) No concern of exacerbation. -Continue as needed bronchodilator  Essential hypertension Patient had softer blood pressure on admission so home losartan and chlorthalidone was held. 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 Recent A1c of 6.1 -Continue with Lantus and SSI  B12 deficiency Found to have significantly low B12 at 55. -Ordered IM supplement   Subjective: Patient was resting comfortably when seen today, son at bedside with concern of decreased urinary output.  P.o. intake remained poor.  Physical Exam: Vitals:   11/25/23 2041 11/26/23 0516 11/26/23 0528 11/26/23 0801  BP: 112/68 (!) 132/44 (!) 142/49 (!) 125/47  Pulse: 62 61  67  Resp: 17 17  18   Temp: 99.1 F (37.3 C) 98.5 F (36.9 C)  98.6 F (37 C)  TempSrc:      SpO2: 94% 95%  97%  Weight:      Height:       General.  Chronically ill-appearing, frail elderly man, in no acute distress. Pulmonary.  Lungs clear bilaterally, normal respiratory effort. CV.  Regular rate and rhythm, no JVD, rub or murmur. Abdomen.  Soft, nontender, nondistended, BS positive. CNS.  Somnolent.  No focal  neurologic deficit. Extremities.  No edema, no cyanosis, pulses intact and symmetrical.   Data Reviewed: Prior data reviewed  Family Communication: Discussed with son at bedside  Disposition: Status is: Inpatient Remains inpatient appropriate because: Severity of illness  Planned Discharge Destination: SNF  DVT prophylaxis.  Lovenox Time spent: 50 minutes  This record has been created using Conservation officer, historic buildings. Errors have been sought and corrected,but may not always be located. Such creation errors do not reflect on the standard of care.   Author: Arnetha Courser, MD 11/26/2023 1:56 PM  For on call review [email or web address].

## 2023-11-26 NOTE — TOC Initial Note (Addendum)
Transition of Care (TOC) - Initial/Assessment Note    Patient Details  Name: Karl Lawson. MRN: [id number] Date of Birth: Oct 26, 1946  Transition of Care [facility]) CM/SW Contact:    Marliss Coots, LCSW Phone Number: 11/26/2023, 9:13 AM  Clinical Narrative:                  9:13 AM CSW called patient's son (patient is not currently fully oriented), Karl Lawson, regarding therapy recommendation of discharge to SNF. CSW also educated Designer, jewellery on SNF process and insurance coverage. Karl Lawson stated that he was in agreeance with recommendation and expressed preference in [facility], as patient's wife is currently a resident there, but consented CSW to submit referral to other SNFs in [facility].  Expected Discharge Plan: Skilled Nursing Facility Barriers to Discharge: Continued Medical Work up, SNF Pending bed offer   Patient Goals and CMS Choice Patient states their goals for this hospitalization and ongoing recovery are:: SNF CMS Medicare.gov Compare Post Acute Care list provided to:: Patient Represenative (must comment) Karl Lawson; Son; [phone]) Choice offered to / list presented to : Adult Children Karl Lawson; Son; [phone])      Expected Discharge Plan and Services In-house Referral: Clinical Social Work   Post Acute Care Choice: Skilled Nursing Facility Living arrangements for the past 2 months: Single Family Home                                      Prior Living Arrangements/Services Living arrangements for the past 2 months: Single Family Home Lives with:: Self Patient language and need for interpreter reviewed:: Yes        Need for Family Participation in Patient Care: Yes (Comment) Care giver support system in place?: Yes (comment)   Criminal Activity/Legal Involvement Pertinent to Current Situation/Hospitalization: No - Comment as needed  Activities of Daily Living   ADL Screening (condition at time of admission) Independently performs ADLs?:  No Does the patient have a NEW difficulty with bathing/dressing/toileting/self-feeding that is expected to last >3 days?: No (needs assist) Does the patient have a NEW difficulty with getting in/out of bed, walking, or climbing stairs that is expected to last >3 days?: No (needs assist, baseline up with walker) Does the patient have a NEW difficulty with communication that is expected to last >3 days?: No Is the patient deaf or have difficulty hearing?: No Does the patient have difficulty seeing, even when wearing glasses/contacts?: Yes (macular degeneration) Does the patient have difficulty concentrating, remembering, or making decisions?: Yes  Permission Sought/Granted Permission sought to share information with : Facility Medical sales representative, Family Supports Permission granted to share information with : No (Contact information on chart; SNF)  Share Information with NAME: Karl Lawson  Permission granted to share info w AGENCY: SNF  Permission granted to share info w Relationship: Son  Permission granted to share info w Contact Information: [phone]  Emotional Assessment   Attitude/Demeanor/Rapport: Unable to Assess Affect (typically observed): Unable to Assess Orientation: : Oriented to Self Alcohol / Substance Use: Not Applicable Psych Involvement: No (comment)  Admission diagnosis:  Sepsis (HCC) [A41.9] Sepsis, due to unspecified organism, unspecified whether acute organ dysfunction present [facility]) [A41.9] Patient Active Problem List   Diagnosis Date Noted   Sepsis (HCC) 11/24/2023   UTI (urinary tract infection) 11/24/2023   ARF (acute renal failure) (HCC) 11/24/2023   Hyponatremia 11/24/2023   Frequent falls 11/24/2023  B12 deficiency 11/24/2023   AV block, 3rd degree (HCC) 08/18/2023   Acute delirium 08/17/2023   Acute metabolic encephalopathy 08/17/2023   Cellulitis of left elbow 06/13/2023   Benign prostatic hyperplasia 06/13/2023   Urge incontinence of urine  06/13/2023   Dysthymia 02/07/2023   Sciatica of right side 02/07/2023   Parkinson's disease (HCC) 07/27/2022   Abnormal nuclear stress test    Hyperglycemia due to type 2 diabetes mellitus (HCC) 02/07/2022   Long term (current) use of insulin (HCC) 02/07/2022   Obesity 02/07/2022   Lower respiratory infection 02/07/2022   Reactive airway disease 02/07/2022   Unilateral inguinal hernia without obstruction or gangrene 12/13/2021   Medication side effect 01/25/2021   Balanitis 01/25/2021   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, bilateral (HCC) 07/29/2019   COPD (chronic obstructive pulmonary disease) (HCC) 12/15/2016   Skin tag 12/15/2016   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   ABNORMAL ELECTROCARDIOGRAM 10/22/2010   OTHER HEART BLOCK 10/14/2010   GERD 05/22/2009   Mixed hyperlipidemia 07/17/2008   Essential hypertension 07/17/2008   History of adenomatous polyps of colon - probable attenuated polyposis 07/17/2008   ELEVATED PROSTATE SPECIFIC ANTIGEN 07/06/2007   PCP:  Mliss Sax, MD Pharmacy:   [facility] [phone] Ginette Otto, [place] - [address] [place] [address] Phone: [phone] Fax: [phone]     Social Drivers of Health (SDOH) Social History: SDOH Screenings   Food Insecurity: No Food Insecurity (11/24/2023)  Housing: Low Risk  (11/24/2023)  Transportation Needs: No Transportation Needs (11/24/2023)  Utilities: Not At Risk (11/24/2023)  Alcohol Screen: Low Risk  (11/14/2023)  Depression (PHQ2-9): Low Risk  (11/15/2023)  Financial Resource Strain: Low Risk  (11/14/2023)  Physical Activity: Sufficiently Active (11/14/2023)  Social Connections: Moderately Integrated (11/24/2023)  Stress: No Stress Concern Present (05/09/2023)  Tobacco Use: Medium Risk (11/24/2023)  Health Literacy: Adequate Health Literacy (09/19/2023)   SDOH Interventions:     Readmission Risk  Interventions     No data to display

## 2023-11-26 NOTE — [place] FL2 (Signed)
[place] MEDICAID FL2 LEVEL OF CARE FORM     IDENTIFICATION  Patient Name: Karl Lawson. Birthdate: 1946/07/25 Sex: male Admission Date (Current Location): 11/23/2023  [facility] and [place] Number:  Producer, television/film/video and Address:  [facility], [address] [place], [place] [address]      Provider Number: [id number]  Attending Physician Name and Address:  Arnetha Courser, MD  Relative Name and Phone Number:  Hamish Trayer; [phone]    Current Level of Care: Hospital Recommended Level of Care: Skilled Nursing Facility Prior Approval Number:    Date Approved/Denied:   PASRR Number: [id number]  Discharge Plan: SNF    Current Diagnoses: Patient Active Problem List   Diagnosis Date Noted   Sepsis (HCC) 11/24/2023   UTI (urinary tract infection) 11/24/2023   ARF (acute renal failure) (HCC) 11/24/2023   Hyponatremia 11/24/2023   Frequent falls 11/24/2023   B12 deficiency 11/24/2023   AV block, 3rd degree (HCC) 08/18/2023   Acute delirium 08/17/2023   Acute metabolic encephalopathy 08/17/2023   Cellulitis of left elbow 06/13/2023   Benign prostatic hyperplasia 06/13/2023   Urge incontinence of urine 06/13/2023   Dysthymia 02/07/2023   Sciatica of right side 02/07/2023   Parkinson's disease (HCC) 07/27/2022   Abnormal nuclear stress test    Hyperglycemia due to type 2 diabetes mellitus (HCC) 02/07/2022   Long term (current) use of insulin (HCC) 02/07/2022   Obesity 02/07/2022   Lower respiratory infection 02/07/2022   Reactive airway disease 02/07/2022   Unilateral inguinal hernia without obstruction or gangrene 12/13/2021   Medication side effect 01/25/2021   Balanitis 01/25/2021   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, bilateral (HCC) 07/29/2019   COPD (chronic obstructive pulmonary disease) (HCC) 12/15/2016   Skin tag 12/15/2016   Salzmann's nodular dystrophy of left eye  01/27/2016   ABNORMAL ELECTROCARDIOGRAM 10/22/2010   OTHER HEART BLOCK 10/14/2010   GERD 05/22/2009   Mixed hyperlipidemia 07/17/2008   Essential hypertension 07/17/2008   History of adenomatous polyps of colon - probable attenuated polyposis 07/17/2008   ELEVATED PROSTATE SPECIFIC ANTIGEN 07/06/2007    Orientation RESPIRATION BLADDER Height & Weight     Self  Normal (Room Air) Incontinent Weight: 208 lb (94.3 kg) Height:  5\' 8"  (172.7 cm)  BEHAVIORAL SYMPTOMS/MOOD NEUROLOGICAL BOWEL NUTRITION STATUS      Continent Diet (Please see dc summary)  AMBULATORY STATUS COMMUNICATION OF NEEDS Skin   Extensive Assist Verbally Normal                       Personal Care Assistance Level of Assistance  Bathing, Feeding, Dressing Bathing Assistance: Maximum assistance Feeding assistance: Maximum assistance Dressing Assistance: Maximum assistance     Functional Limitations Info  Sight Sight Info: Impaired (R and L)        SPECIAL CARE FACTORS FREQUENCY  PT (By licensed PT), OT (By licensed OT)     PT Frequency: 5x OT Frequency: 5x            Contractures Contractures Info: Not present    Additional Factors Info  Code Status, Allergies Code Status Info: DNR- Limited Do Not Intubate/DNI Allergies Info: NKA           Current Medications (11/26/2023):  This is the current hospital active medication list Current Facility-Administered Medications  Medication Dose Route Frequency Provider Last Rate Last Admin   0.9 %  sodium chloride infusion  Intravenous Continuous Arnetha Courser, MD 75 mL/hr at 11/26/23 0626 Infusion Verify at 11/26/23 0626   acetaminophen (TYLENOL) tablet 650 mg  650 mg Oral Q6H PRN Eduard Clos, MD   650 mg at 11/24/23 0939   albuterol (PROVENTIL) (2.5 MG/3ML) 0.083% nebulizer solution 3 mL  3 mL Inhalation Q6H PRN Eduard Clos, MD   3 mL at 11/26/23 0408   aspirin EC tablet 81 mg  81 mg Oral Daily Eduard Clos, MD   81 mg at  11/26/23 1610   atorvastatin (LIPITOR) tablet 40 mg  40 mg Oral Daily Eduard Clos, MD   40 mg at 11/26/23 9604   carbidopa-levodopa (SINEMET IR) 25-100 MG per tablet immediate release 1.5 tablet  1.5 tablet Oral TID Eduard Clos, MD   1.5 tablet at 11/26/23 0905   cefTRIAXone (ROCEPHIN) 2 g in sodium chloride 0.9 % 100 mL IVPB  2 g Intravenous Q24H Eduard Clos, MD   Stopped at 11/25/23 2348   cyanocobalamin (VITAMIN B12) injection 1,000 mcg  1,000 mcg Intramuscular Q0600 Arnetha Courser, MD   1,000 mcg at 11/26/23 0521   enoxaparin (LOVENOX) injection 40 mg  40 mg Subcutaneous Q24H Eduard Clos, MD   40 mg at 11/26/23 0905   finasteride (PROSCAR) tablet 5 mg  5 mg Oral Daily Eduard Clos, MD   5 mg at 11/26/23 0906   insulin aspart (novoLOG) injection 0-9 Units  0-9 Units Subcutaneous TID WC Eduard Clos, MD   2 Units at 11/26/23 0906   insulin glargine-yfgn [facility]) injection 15 Units  15 Units Subcutaneous Daily Eduard Clos, MD   15 Units at 11/26/23 0904   pantoprazole (PROTONIX) EC tablet 40 mg  40 mg Oral Daily Eduard Clos, MD   40 mg at 11/26/23 5409   tamsulosin (FLOMAX) capsule 0.4 mg  0.4 mg Oral QPC supper Arnetha Courser, MD   0.4 mg at 11/25/23 1645     Discharge Medications: Please see discharge summary for a list of discharge medications.  Relevant Imaging Results:  Relevant Lab Results:   Additional Information [id number]  Marliss Coots, LCSW

## 2023-11-26 NOTE — Plan of Care (Signed)

## 2023-11-26 NOTE — Plan of Care (Addendum)
Urine was pinkish colored, notified MD  Problem: Education: Goal: Ability to describe self-care measures that may prevent or decrease complications (Diabetes Survival Skills Education) will improve Outcome: Progressing Goal: Individualized Educational Video(s) Outcome: Progressing   Problem: Coping: Goal: Ability to adjust to condition or change in health will improve Outcome: Progressing   Problem: Fluid Volume: Goal: Ability to maintain a balanced intake and output will improve Outcome: Progressing   Problem: Health Behavior/Discharge Planning: Goal: Ability to identify and utilize available resources and services will improve Outcome: Progressing Goal: Ability to manage health-related needs will improve Outcome: Progressing   Problem: Metabolic: Goal: Ability to maintain appropriate glucose levels will improve Outcome: Progressing   Problem: Nutritional: Goal: Maintenance of adequate nutrition will improve Outcome: Progressing Goal: Progress toward achieving an optimal weight will improve Outcome: Progressing   Problem: Skin Integrity: Goal: Risk for impaired skin integrity will decrease Outcome: Progressing   Problem: Tissue Perfusion: Goal: Adequacy of tissue perfusion will improve Outcome: Progressing   Problem: Education: Goal: Knowledge of General Education information will improve Description: Including pain rating scale, medication(s)/side effects and non-pharmacologic comfort measures Outcome: Progressing   Problem: Health Behavior/Discharge Planning: Goal: Ability to manage health-related needs will improve Outcome: Progressing   Problem: Clinical Measurements: Goal: Ability to maintain clinical measurements within normal limits will improve Outcome: Progressing Goal: Will remain free from infection Outcome: Progressing Goal: Diagnostic test results will improve Outcome: Progressing Goal: Respiratory complications will improve Outcome:  Progressing Goal: Cardiovascular complication will be avoided Outcome: Progressing   Problem: Activity: Goal: Risk for activity intolerance will decrease Outcome: Progressing   Problem: Nutrition: Goal: Adequate nutrition will be maintained Outcome: Progressing   Problem: Coping: Goal: Level of anxiety will decrease Outcome: Progressing   Problem: Elimination: Goal: Will not experience complications related to bowel motility Outcome: Progressing Goal: Will not experience complications related to urinary retention Outcome: Progressing   Problem: Pain Managment: Goal: General experience of comfort will improve and/or be controlled Outcome: Progressing   Problem: Safety: Goal: Ability to remain free from injury will improve Outcome: Progressing   Problem: Skin Integrity: Goal: Risk for impaired skin integrity will decrease Outcome: Progressing   Problem: Safety: Goal: Non-violent Restraint(s) Outcome: Progressing

## 2023-11-27 DIAGNOSIS — N179 Acute kidney failure, unspecified: Secondary | ICD-10-CM | POA: Diagnosis not present

## 2023-11-27 DIAGNOSIS — N3000 Acute cystitis without hematuria: Secondary | ICD-10-CM | POA: Diagnosis not present

## 2023-11-27 DIAGNOSIS — E871 Hypo-osmolality and hyponatremia: Secondary | ICD-10-CM | POA: Diagnosis not present

## 2023-11-27 DIAGNOSIS — A419 Sepsis, unspecified organism: Secondary | ICD-10-CM | POA: Diagnosis not present

## 2023-11-27 LAB — CBC
HCT: 33.5 % — ABNORMAL LOW (ref 39.0–52.0)
Hemoglobin: 11.5 g/dL — ABNORMAL LOW (ref 13.0–17.0)
MCH: 27 pg (ref 26.0–34.0)
MCHC: 34.3 g/dL (ref 30.0–36.0)
MCV: 78.6 fL — ABNORMAL LOW (ref 80.0–100.0)
Platelets: 226 10*3/uL (ref 150–400)
RBC: 4.26 MIL/uL (ref 4.22–5.81)
RDW: 16.4 % — ABNORMAL HIGH (ref 11.5–15.5)
WBC: 9.8 10*3/uL (ref 4.0–10.5)
nRBC: 0 % (ref 0.0–0.2)

## 2023-11-27 LAB — BASIC METABOLIC PANEL
Anion gap: 11 (ref 5–15)
BUN: 34 mg/dL — ABNORMAL HIGH (ref 8–23)
CO2: 21 mmol/L — ABNORMAL LOW (ref 22–32)
Calcium: 9.1 mg/dL (ref 8.9–10.3)
Chloride: 99 mmol/L (ref 98–111)
Creatinine, Ser: 1.55 mg/dL — ABNORMAL HIGH (ref 0.61–1.24)
GFR, Estimated: 46 mL/min — ABNORMAL LOW (ref >60)
Glucose, Bld: 211 mg/dL — ABNORMAL HIGH (ref 70–99)
Potassium: 3.4 mmol/L — ABNORMAL LOW (ref 3.5–5.1)
Sodium: 131 mmol/L — ABNORMAL LOW (ref 135–145)

## 2023-11-27 LAB — GLUCOSE, CAPILLARY
Glucose-Capillary: 196 mg/dL — ABNORMAL HIGH (ref 70–99)
Glucose-Capillary: 167 mg/dL — ABNORMAL HIGH (ref 70–99)
Glucose-Capillary: 203 mg/dL — ABNORMAL HIGH (ref 70–99)
Glucose-Capillary: 174 mg/dL — ABNORMAL HIGH (ref 70–99)

## 2023-11-27 MED ORDER — CHLORHEXIDINE GLUCONATE CLOTH 2 % EX PADS
6.0000 | MEDICATED_PAD | Freq: Every day | CUTANEOUS | Status: DC
  Administered 2023-11-27 – 2023-12-04 (×7): 6 via TOPICAL

## 2023-11-27 MED ORDER — POTASSIUM CHLORIDE CRYS ER 20 MEQ PO TBCR
20.0000 meq | EXTENDED_RELEASE_TABLET | Freq: Once | ORAL | Status: AC
  Administered 2023-11-27: 20 meq via ORAL
  Filled 2023-11-27: qty 1

## 2023-11-27 NOTE — Progress Notes (Signed)
Progress Note   Patient: Karl Lawson. XLK:[id number] DOB: 1946/01/31 DOA: 11/23/2023     3 DOS: the patient was seen and examined on 11/27/2023   Brief hospital course: Taken from H&P.  Karl Lawson. is a 78 y.o. male with history of Parkinson's disease, diabetes mellitus type 2, complete heart block status post pacemaker placement, COPD, hypertension was brought to the ER by patient's son after patient was having increasing confusion weakness and complaining of increasing burning micturition.  Patient has been having frequent falls and had come to the ER on 11/16/2023 when the scans showed nothing acute.  Patient was hydrated and discharged home.  Subsequent which patient has followed up with his primary care physician and also urologist.   On presentation patient has softer blood pressure, tachycardic and tachypneic,  Blood pressure improved with IV fluid.labs with leukocytosis at 22,000, UA concerning for UTI.  Sodium 124, BUN 26, creatinine 1.49, T. bili 2, lactic acid 2 and subsequent 2.6.  COVID and flu PCR were negative.  Chest x-ray with no active disease.  Some scarring or atelectasis at the lingula and bilateral bases. Blood and urine cultures were drawn and patient was started on on ceftriaxone.  1/17; vitals with mild tachycardia and tachypnea, remained on room air.  Repeat labs with slightly improving leukocytosis and renal function.  B12 levels low at 55-ordered IM replacement, sodium with some improvement 127, hyponatremia labs still pending.  Preliminary blood cultures negative, urine cultures pending. 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  Also noted BPH, cholelithiasis and diverticulosis without any sign of infection.  Starting on Flomax. Obtained postvoid bladder scan which was negative for any significant retention.  1/18: Patient was quite somnolent this morning, received Haldol and Ativan  overnight due to excessive agitation.  Creatinine again increased to 1.86, sodium decreased to 124, improving leukocytosis, urine cultures with gram-negative rods, did not eat or drink anything since yesterday.  Giving some more IV fluid.  1/19: Vital stable, urine cultures with Klebsiella pneumonia, CBG elevated, corrected sodium at 129, potassium 3.4, worsening creatinine at 2.4.  Renal ultrasound today with renal pelvis fullness, concerning for nephritis.  No obvious hydronephrosis or obstruction.  Nephrology was also consulted. Improving leukocytosis.  1/20: Vital stable, UOP of 2450 recorded.  Sensitivities for Klebsiella pneumonia came back as ESBL, only sensitive to carbapenem, ceftriaxone was switched to meropenem yesterday evening. Patient likely will need 7-day course.  Foley catheter was placed yesterday at nephrology request.  Improving renal functions today. We should try voiding trial in 1 to 2 days. PT is recommending SNF.   Assessment and Plan: * Sepsis [facility]) Patient met sepsis criteria with leukocytosis, tachycardia and tachypnea.  Likely due to UTI.  CT renal stone study with mild bilateral hydronephrosis and some bladder distention.  Bladder scan with no significant retention.  Also noted some bladder wall thickening and small amount of air along the right lateral wall consistent with emphysematous cystitis. Also noted cholelithiasis and colonic diverticulosis with no sign of infection.  Prostate hypertrophy was also noted. Urine cultures with ESBL Klebsiella pneumonia- Renal ultrasound today with concern of bilateral nephritis -Antibiotics switched with meropenem-likely will need at least 7-day course  UTI (urinary tract infection) Urine cultures with pretty resistant ESBL Klebsiella pneumonia, only show sensitivity to Carbapenem. -Antibiotics switched with meropenem  ARF (acute renal failure) (HCC) Seems like having CKD stage IIIa.  Creatinine continued to get worse with  decreased urine production despite getting IV fluid.  Renal ultrasound today with concern of renal pelvis fullness but no obvious obstruction.  Nephrology was also consulted Foley catheter was placed at nephrology request, improved UOP and creatinine today -Will need voiding trial in 1 to 2 days -Strict intake and output -Monitor renal function -Avoid nephrotoxins  Hyponatremia Sodium to 127>>124>>129>>133.  Patient was on chlorthalidone at home which should be discontinued on discharge. Hyponatremia labs with normal urine osmolality and urine sodium of 42. -Responded to normal saline -Monitor sodium  Frequent falls And worsening weakness, likely due to poor p.o. intake and physical deconditioning.  CT head and cervical spine was negative for any acute abnormality. 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 -No acute concern  COPD (chronic obstructive pulmonary disease) (HCC) No concern of exacerbation. -Continue as needed bronchodilator  Essential hypertension Patient had softer blood pressure on admission so home losartan and chlorthalidone was held. 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 Recent A1c of 6.1 -Continue with Lantus and SSI  B12 deficiency Found to have significantly low B12 at 55. -Ordered IM supplement   Subjective: Patient was feeling little improved when seen today.  No new concern.  Brother-in-law at bedside.  Foley draining well.  Physical Exam: Vitals:   11/26/23 1930 11/26/23 2123 11/27/23 0429 11/27/23 0915  BP: (!) 121/43 (!) 118/41 (!) 121/41 (!)  128/46  Pulse: 64 (!) 57 65 72  Resp: 18 19 18    Temp: 99 F (37.2 C) 98.7 F (37.1 C) 98.6 F (37 C) 98.2 F (36.8 C)  TempSrc: Oral Oral Oral   SpO2: 95% 96% 97% 97%  Weight:      Height:       General.  Frail elderly man, in no acute distress. Pulmonary.  Lungs clear bilaterally, normal respiratory effort. CV.  Regular rate and rhythm, no JVD, rub or murmur. Abdomen.  Soft, nontender, nondistended, BS positive. CNS.  Alert and oriented .  No focal neurologic deficit. Extremities.  No edema, no cyanosis, pulses intact and symmetrical.   Data Reviewed: Prior data reviewed  Family Communication: Discussed with brother-in-law at bedside  Disposition: Status is: Inpatient Remains inpatient appropriate because: Severity of illness  Planned Discharge Destination: SNF  DVT prophylaxis.  Lovenox Time spent: 45 minutes  This record has been created using Conservation officer, historic buildings. Errors have been sought and corrected,but may not always be located. Such creation errors do not reflect on the standard of care.   Author: Arnetha Courser, MD 11/27/2023 2:20 PM  For on call review [email or web address].

## 2023-11-27 NOTE — TOC Progression Note (Signed)
Transition of Care (TOC) - Progression Note    Patient Details  Name: Karl Lawson. MRN: [id number] Date of Birth: 1946/05/03  Transition of Care [facility]) CM/SW Contact  Leya Paige A [place], [place] Phone Number: 11/27/2023, 2:25 PM  Clinical Narrative:     CSW reached out to pt's son Kathlene November, to provide bed offers with medicare.gov ratings. He stated that he was interested in [facility], as his mother, pt's wife, is there for LTC. CSW to reach out to [place] at [facility] to find out when bed is available. CSW to start insurance authorization once pt is medically stable.    TOC will continue to follow.   Expected Discharge Plan: Skilled Nursing Facility Barriers to Discharge: Continued Medical Work up, SNF Pending bed offer  Expected Discharge Plan and Services In-house Referral: Clinical Social Work   Post Acute Care Choice: Skilled Nursing Facility Living arrangements for the past 2 months: Single Family Home                                       Social Determinants of Health (SDOH) Interventions SDOH Screenings   Food Insecurity: No Food Insecurity (11/24/2023)  Housing: Low Risk  (11/24/2023)  Transportation Needs: No Transportation Needs (11/24/2023)  Utilities: Not At Risk (11/24/2023)  Alcohol Screen: Low Risk  (11/14/2023)  Depression (PHQ2-9): Low Risk  (11/15/2023)  Financial Resource Strain: Low Risk  (11/14/2023)  Physical Activity: Sufficiently Active (11/14/2023)  Social Connections: Moderately Integrated (11/24/2023)  Stress: No Stress Concern Present (05/09/2023)  Tobacco Use: Medium Risk (11/24/2023)  Health Literacy: Adequate Health Literacy (09/19/2023)    Readmission Risk Interventions     No data to display

## 2023-11-27 NOTE — Plan of Care (Signed)

## 2023-11-28 DIAGNOSIS — A419 Sepsis, unspecified organism: Secondary | ICD-10-CM | POA: Diagnosis not present

## 2023-11-28 DIAGNOSIS — R6520 Severe sepsis without septic shock: Secondary | ICD-10-CM | POA: Diagnosis not present

## 2023-11-28 LAB — CULTURE, BLOOD (ROUTINE X 2)
Culture: NO GROWTH
Culture: NO GROWTH

## 2023-11-28 LAB — BASIC METABOLIC PANEL
Anion gap: 10 (ref 5–15)
BUN: 27 mg/dL — ABNORMAL HIGH (ref 8–23)
CO2: 24 mmol/L (ref 22–32)
Calcium: 9.4 mg/dL (ref 8.9–10.3)
Chloride: 101 mmol/L (ref 98–111)
Creatinine, Ser: 1.17 mg/dL (ref 0.61–1.24)
GFR, Estimated: >60 mL/min (ref >60)
Glucose, Bld: 208 mg/dL — ABNORMAL HIGH (ref 70–99)
Potassium: 3.6 mmol/L (ref 3.5–5.1)
Sodium: 135 mmol/L (ref 135–145)

## 2023-11-28 LAB — GLUCOSE, CAPILLARY
Glucose-Capillary: 205 mg/dL — ABNORMAL HIGH (ref 70–99)
Glucose-Capillary: 294 mg/dL — ABNORMAL HIGH (ref 70–99)
Glucose-Capillary: 187 mg/dL — ABNORMAL HIGH (ref 70–99)
Glucose-Capillary: 194 mg/dL — ABNORMAL HIGH (ref 70–99)

## 2023-11-28 MED ORDER — VITAMIN B-12 1000 MCG PO TABS
1000.0000 ug | ORAL_TABLET | Freq: Every day | ORAL | Status: DC
  Administered 2023-11-28 – 2023-12-04 (×7): 1000 ug via ORAL
  Filled 2023-11-28 (×7): qty 1

## 2023-11-28 MED ORDER — ENSURE ENLIVE PO LIQD
237.0000 mL | Freq: Two times a day (BID) | ORAL | Status: DC
Start: 2023-11-28 — End: 2023-12-04
  Administered 2023-11-28 – 2023-12-04 (×11): 237 mL via ORAL

## 2023-11-28 MED ORDER — SODIUM CHLORIDE 0.9 % IV SOLN
1.0000 g | Freq: Three times a day (TID) | INTRAVENOUS | Status: AC
  Administered 2023-11-29 – 2023-12-01 (×7): 1 g via INTRAVENOUS
  Filled 2023-11-28 (×8): qty 20

## 2023-11-28 NOTE — Progress Notes (Signed)
Pt's foley cath removed at 1210, Pt voided 380 ml at 1342.

## 2023-11-28 NOTE — Progress Notes (Signed)
Physical Therapy Treatment Patient Details Name: Karl Lawson. MRN: [id number] DOB: 09/14/1946 Today's Date: 11/28/2023   History of Present Illness 78 yo male presents to [facility] on 11/23/23 w/ more frequent falls and burning micturition. Admitted w/ sepsis likely 2/2 UTI, acute renal failure, and hyponatremia. CT head and cervical spine neg. PMHx: AKI, PMH includes obesity, DM2, HTN, HLD, COPD, GERD, Parkinson disease.    PT Comments  Pt received in supine and agreeable to session. Pt demonstrates improved alertness and participation this session. Pt able to sit to EOB with min A, however requires increased cues for problem solving and sequencing. Pt requires mod A to stand from EOB due to posterior bias, but progresses to min A from recliner with improved BUE support and anterior weight shift. Pt able to pivot with min A +2 and perform static standing marches during second standing trial. Pt demonstrates increased BLE buckling with increased fatigue. Pt continues to benefit from PT services to progress toward functional mobility goals.     If plan is discharge home, recommend the following: A lot of help with walking and/or transfers;A lot of help with bathing/dressing/bathroom;Assistance with cooking/housework;Direct supervision/assist for medications management;Direct supervision/assist for financial management;Assist for transportation;Help with stairs or ramp for entrance   Can travel by private vehicle     No  Equipment Recommendations  Other (comment) (TBD at next venue)    Recommendations for Other Services       Precautions / Restrictions Precautions Precautions: Fall Restrictions Weight Bearing Restrictions Per Provider Order: No     Mobility  Bed Mobility Overal bed mobility: Needs Assistance Bed Mobility: Supine to Sit     Supine to sit: Min assist, HOB elevated, Used rails     General bed mobility comments: Cues for technique and min A for trunk elevation     Transfers Overall transfer level: Needs assistance Equipment used: Rolling walker (2 wheels) Transfers: Sit to/from Stand, Bed to chair/wheelchair/BSC Sit to Stand: Mod assist, Min assist   Step pivot transfers: Min assist, +2 safety/equipment       General transfer comment: STS from EOB with mod A progressing to min A from recliner with cues for hand placement. Step pivot to recliner with BLE instability requiring min A +2 for balance and safety. Pt demonstrates slight posterior bias and difficulty with BUE support on RW    Ambulation/Gait             Pre-gait activities: static standing marches General Gait Details: deferred due to BLE buckling      Balance Overall balance assessment: Needs assistance, History of Falls Sitting-balance support: Bilateral upper extremity supported, Feet supported Sitting balance-Leahy Scale: Fair Sitting balance - Comments: sitting EOB   Standing balance support: Bilateral upper extremity supported, During functional activity, Reliant on assistive device for balance Standing balance-Leahy Scale: Poor Standing balance comment: with RW support                            Cognition Arousal: Alert Behavior During Therapy: Flat affect Overall Cognitive Status: Impaired/Different from baseline                                 General Comments: Pt able to state he is at [facility], but still demonstrates some intermittent confusion        Exercises General Exercises - Lower Extremity Hip Flexion/Marching: AROM,  Standing, Both, 10 reps    General Comments        Pertinent Vitals/Pain Pain Assessment Pain Assessment: No/denies pain     PT Goals (current goals can now be found in the care plan section) Acute Rehab PT Goals Patient Stated Goal: unable to participate PT Goal Formulation: Patient unable to participate in goal setting Time For Goal Achievement: 12/09/23 Progress towards PT goals:  Progressing toward goals    Frequency    Min 1X/week       AM-PAC PT "6 Clicks" Mobility   Outcome Measure  Help needed turning from your back to your side while in a flat bed without using bedrails?: A Little Help needed moving from lying on your back to sitting on the side of a flat bed without using bedrails?: A Little Help needed moving to and from a bed to a chair (including a wheelchair)?: A Lot Help needed standing up from a chair using your arms (e.g., wheelchair or bedside chair)?: A Lot Help needed to walk in hospital room?: Total Help needed climbing 3-5 steps with a railing? : Total 6 Click Score: 12    End of Session Equipment Utilized During Treatment: Gait belt Activity Tolerance: Patient limited by fatigue;Patient tolerated treatment well Patient left: with call bell/phone within reach;in chair;with chair alarm set Nurse Communication: Mobility status PT Visit Diagnosis: Repeated falls (R29.6);Muscle weakness (generalized) (M62.81);Unsteadiness on feet (R26.81)     Time: 4098-1191 PT Time Calculation (min) (ACUTE ONLY): 25 min  Charges:    $Therapeutic Activity: 23-37 mins PT General Charges $$ ACUTE PT VISIT: 1 Visit                     Johny Shock, PTA Acute Rehabilitation Services Secure Chat Preferred  [phone]    Johny Shock 11/28/2023, 10:22 AM

## 2023-11-28 NOTE — Progress Notes (Signed)
PROGRESS NOTE  Karl Lawson.  ZOX:[id number] DOB: 05-12-1946 DOA: 11/23/2023 PCP: Mliss Sax, MD   Brief Narrative: Patient is a 78 year old male with history of Parkinson disease, diabetes type 2, complete heart block status post pacemaker placement, COPD, hypertension who was brought here from home for evaluation of increased confusion, weakness, dysuria.  On presentation, he was hypotensive ,tachycardiac, tachypneic.  Lab work showed WC count of 22,000, sodium of 124, creatinine of 1.49.  UA suspicious for UTI.  Patient was started on ceftriaxone.  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  Started on Flomax.  Urine culture showed ESBL Klebsiella pneumoniae, started on meropenem.  Due to urinary retention, Foley placed.Marland Kitchen  Hospital course remarkable for agitation.  PT recommending SNF.TOC following  Assessment & Plan:  Principal Problem:   Sepsis (HCC) Active Problems:   UTI (urinary tract infection)   ARF (acute renal failure) (HCC)   Hyponatremia   Frequent falls   AV block, 3rd degree (HCC)   COPD (chronic obstructive pulmonary disease) (HCC)   Essential hypertension   Parkinson's disease (HCC)   Mixed hyperlipidemia   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, bilateral (HCC)   B12 deficiency  Sepsis secondary to UTI: Presented with leukocytosis, tachycardia, tachypnea.  Currently hemodynamically stable.  Afebrile.  Sepsis physiology has resolved.  Blood cultures did not show any growth.  Urine culture showing Klebsiella pneumonia.  ESBL UTI: Urine culture showed Klebsiella pneumoniae, ESBL.  Currently on meropenem.  Plan for 5 days course  Acute renal failure and CKD stage IIIb/urine retention: Baseline creatinine fluctuating from 1.2-1.5.  Creatinine worsened so Foley placed.  Renal ultrasound did not show any obstruction.  Creatinine normalized today.  Will give voiding trial.  Continue Flomax.  Avoid nephrotoxins  Hyponatremia: Improved with normal saline  Third-degree AV block: Status post pacemaker placement  COPD: Currently not in exacerbation.  Continue bronchodilators as needed  Hypertension: Blood pressure currently stable.  Home losartan chlorthalidone on hold  Parkinson's disease:On  Sinemet.  Oriented to place currently.  Was agitated last night and had to be given Haldol, Ativan  Hyperlipidemia: On Lipitor  Type 2 diabetes: On insulin at home.  Patient A1c of 6.1.  Continue current insulin regimen  Vitamin B-12 deficiency: Low at 55.  Ordered IM supplementation.  Continue oral supplementation daily.  Debility/deconditioning: PT/OT recommending SNF on discharge.  TOC following.        DVT prophylaxis:enoxaparin (LOVENOX) injection 40 mg Start: 11/24/23 1000     Code Status: Limited: Do not attempt resuscitation (DNR) -DNR-LIMITED -Do Not Intubate/DNI   Family Communication: None at the bedside  Patient status:Inpatient  Patient is from :home  Anticipated discharge to:SNF  Estimated DC date:1-2 days   Consultants: None  Procedures:none  Antimicrobials:  Anti-infectives (From admission, onward)    Start     Dose/Rate Route Frequency Ordered Stop   11/26/23 1600  meropenem (MERREM) 1 g in sodium chloride 0.9 % 100 mL IVPB        1 g 200 mL/hr over 30 Minutes Intravenous Every 12 hours 11/26/23 1453     11/24/23 2300  cefTRIAXone (ROCEPHIN) 2 g in sodium chloride 0.9 % 100 mL IVPB  Status:  Discontinued        2 g 200 mL/hr over 30 Minutes Intravenous Every 24 hours 11/24/23 0441 11/26/23 1453   11/23/23 2230  cefTRIAXone (ROCEPHIN) 2 g in sodium chloride 0.9 % 100  mL IVPB        2 g 200 mL/hr over 30 Minutes Intravenous Once 11/23/23 2224 11/24/23 0004       Subjective: Patient seen and examined at bedside today.  Hemodynamically stable.  Overall comfortable, sitting on the chair.  He was  agitated last night and was given Haldol and Ativan.  Slightly confused this morning, oriented to place only.  Obeys commands.  Not in any distress.  Holding Foley bag on his hands.  Denies shortness of breath or cough.  No abdomen pain, nausea or vomiting  Objective: Vitals:   11/27/23 1930 11/28/23 0000 11/28/23 0400 11/28/23 0731  BP: 132/60 (!) 140/70 (!) 144/72 (!) 142/52  Pulse: 78 71 70 92  Resp:  19 20   Temp: 97.6 F (36.4 C) 98.5 F (36.9 C) 98.2 F (36.8 C) 98.3 F (36.8 C)  TempSrc: Oral Oral Oral Oral  SpO2: 98% 98% 96% (!) 89%  Weight:      Height:        Intake/Output Summary (Last 24 hours) at 11/28/2023 0944 Last data filed at 11/28/2023 0249 Gross per 24 hour  Intake 330 ml  Output 1800 ml  Net -1470 ml   Filed Weights   11/23/23 2100  Weight: 94.3 kg    Examination:  General exam: Overall comfortable, not in distress,obese HEENT: PERRL Respiratory system:  no wheezes or crackles  Cardiovascular system: S1 & S2 heard, RRR.  Gastrointestinal system: Abdomen is nondistended, soft and nontender. Central nervous system: Alert and awake, obeys commands, oriented to place only Extremities: No edema, no clubbing ,no cyanosis Skin: No rashes, no ulcers,no icterus   GU: Foley   Data Reviewed: I have personally reviewed following labs and imaging studies  CBC: Recent Labs  Lab 11/23/23 2246 11/24/23 0837 11/25/23 0849 11/26/23 0653 11/27/23 0914  WBC 22.3* 19.9* 16.1* 11.6* 9.8  NEUTROABS 18.5* 17.4*  --   --   --   HGB 13.3 13.9 12.5* 11.6* 11.5*  HCT 39.8 40.9 35.6* 34.1* 33.5*  MCV 79.6* 79.7* 76.7* 77.9* 78.6*  PLT 253 244 251 212 226   Basic Metabolic Panel: Recent Labs  Lab 11/23/23 2315 11/24/23 0837 11/25/23 0849 11/26/23 0653 11/27/23 0914  NA 124* 127* 124* 127* 131*  K 4.4 3.8 3.6 3.4* 3.4*  CL 90* 93* 90* 95* 99  CO2 20* 21* 20* 20* 21*  GLUCOSE 186* 190* 229* 209* 211*  BUN 26* 22 29* 37* 34*  CREATININE 1.49* 1.23 1.86*  2.24* 1.55*  CALCIUM 9.8 9.8 9.4 8.9 9.1     Recent Results (from the past 240 hours)  Urine Culture     Status: Abnormal   Collection Time: 11/23/23 10:45 PM   Specimen: Urine, Random  Result Value Ref Range Status   Specimen Description URINE, RANDOM  Final   Special Requests   Final    NONE Reflexed from [id number] Performed at [facility], [address], [place], [place] [address]    Culture (A)  Final    >=100,000 COLONIES/mL KLEBSIELLA PNEUMONIAE Confirmed Extended Spectrum Beta-Lactamase Producer (ESBL).  In bloodstream infections from ESBL organisms, carbapenems are preferred over piperacillin/tazobactam. They are shown to have a lower risk of mortality.    Report Status 11/26/2023 FINAL  Final   Organism ID, Bacteria KLEBSIELLA PNEUMONIAE (A)  Final      Susceptibility   Klebsiella pneumoniae - MIC*    AMPICILLIN >=32 RESISTANT Resistant     CEFAZOLIN >=64 RESISTANT Resistant  CEFEPIME >=32 RESISTANT Resistant     CEFTRIAXONE >=64 RESISTANT Resistant     CIPROFLOXACIN 2 RESISTANT Resistant     GENTAMICIN >=16 RESISTANT Resistant     IMIPENEM 1 SENSITIVE Sensitive     NITROFURANTOIN 128 RESISTANT Resistant     TRIMETH/SULFA >=320 RESISTANT Resistant     AMPICILLIN/SULBACTAM >=32 RESISTANT Resistant     PIP/TAZO 32 INTERMEDIATE Intermediate ug/mL    * >=100,000 COLONIES/mL KLEBSIELLA PNEUMONIAE  Resp panel by RT-PCR (RSV, Flu A&B, Covid) Anterior Nasal Swab     Status: None   Collection Time: 11/23/23 10:48 PM   Specimen: Anterior Nasal Swab  Result Value Ref Range Status   SARS Coronavirus 2 by RT PCR NEGATIVE NEGATIVE Final   Influenza A by PCR NEGATIVE NEGATIVE Final   Influenza B by PCR NEGATIVE NEGATIVE Final    Comment: (NOTE) The Xpert Xpress SARS-CoV-2/FLU/RSV plus assay is intended as an aid in the diagnosis of influenza from Nasopharyngeal swab specimens and should not be used as a sole basis for treatment. Nasal washings and aspirates are  unacceptable for Xpert Xpress SARS-CoV-2/FLU/RSV testing.  Fact Sheet for Patients: BloggerCourse.com  Fact Sheet for Healthcare Providers: SeriousBroker.it  This test is not yet approved or cleared by the [place] FDA and has been authorized for detection and/or diagnosis of SARS-CoV-2 by FDA under an Emergency Use Authorization (EUA). This EUA will remain in effect (meaning this test can be used) for the duration of the COVID-19 declaration under Section 564(b)(1) of the Act, 21 U.S.C. section 360bbb-3(b)(1), unless the authorization is terminated or revoked.     Resp Syncytial Virus by PCR NEGATIVE NEGATIVE Final    Comment: (NOTE) Fact Sheet for Patients: BloggerCourse.com  Fact Sheet for Healthcare Providers: SeriousBroker.it  This test is not yet approved or cleared by the [place] FDA and has been authorized for detection and/or diagnosis of SARS-CoV-2 by FDA under an Emergency Use Authorization (EUA). This EUA will remain in effect (meaning this test can be used) for the duration of the COVID-19 declaration under Section 564(b)(1) of the Act, 21 U.S.C. section 360bbb-3(b)(1), unless the authorization is terminated or revoked.  Performed at [facility], [address] [place], [place] [address]   Blood Culture (routine x 2)     Status: None (Preliminary result)   Collection Time: 11/23/23 11:00 PM   Specimen: BLOOD  Result Value Ref Range Status   Specimen Description BLOOD LEFT ANTECUBITAL  Final   Special Requests   Final    BOTTLES DRAWN AEROBIC AND ANAEROBIC Blood Culture results may not be optimal due to an inadequate volume of blood received in culture bottles   Culture   Final    NO GROWTH 4 DAYS Performed at [facility], [address] [place], [place] [address]    Report Status PENDING  Incomplete  Blood Culture (routine x 2)      Status: None (Preliminary result)   Collection Time: 11/23/23 11:15 PM   Specimen: BLOOD  Result Value Ref Range Status   Specimen Description BLOOD RIGHT ANTECUBITAL  Final   Special Requests   Final    BOTTLES DRAWN AEROBIC AND ANAEROBIC Blood Culture results may not be optimal due to an inadequate volume of blood received in culture bottles   Culture   Final    NO GROWTH 4 DAYS Performed at [facility], [address], [place], [place] [address]    Report Status PENDING  Incomplete  Radiology Studies: US RENAL Result Date: 11/26/2023 CLINICAL DATA:  Acute kidney injury EXAM: RENAL / URINARY TRACT ULTRASOUND COMPLETE COMPARISON:  CT 11/24/2023 FINDINGS: Right Kidney: Renal measurements: 12.9 x 6.5 x 8.9 cm = volume: 397 mL. Slightly increased echogenicity. Mild fullness of the renal collecting system. 3 cm cyst in the midportion. Left Kidney: Renal measurements: 13.2 x 6.7 x 7.3 cm = volume: 341 mL. Slightly increased echogenicity. Mild fullness of the renal collecting system. 2.5 cm cyst in the midportion. Bladder: Nonvisualization of the bladder. Other: None. IMPRESSION: 1. Enlarged kidneys with slightly increased echogenicity of the kidneys bilaterally. Mild fullness of the renal collecting systems bilaterally. Findings consistent with acute nephritis. Fullness of the collecting systems not specifically explained. Sonographer did not find the bladder, but it appear to be distended on the CT study of 2 days ago. Electronically Signed   By: Paulina Fusi M.D.   On: 11/26/2023 13:49    Scheduled Meds:  aspirin EC  81 mg Oral Daily   atorvastatin  40 mg Oral Daily   carbidopa-levodopa  1.5 tablet Oral TID   Chlorhexidine Gluconate Cloth  6 each Topical Daily   cyanocobalamin  1,000 mcg Intramuscular Q0600   enoxaparin (LOVENOX) injection  40 mg Subcutaneous Q24H   feeding supplement  237 mL Oral BID BM   finasteride  5 mg Oral Daily   insulin aspart  0-9 Units Subcutaneous TID  WC   insulin glargine-yfgn  15 Units Subcutaneous Daily   pantoprazole  40 mg Oral Daily   tamsulosin  0.4 mg Oral QPC supper   Continuous Infusions:  meropenem (MERREM) IV 1 g (11/28/23 0511)     LOS: 4 days   Burnadette Pop, MD [facility] P1/21/2025, 9:44 AM

## 2023-11-28 NOTE — Progress Notes (Signed)
PHARMACY NOTE:  ANTIMICROBIAL RENAL DOSAGE ADJUSTMENT  Current antimicrobial regimen includes a mismatch between antimicrobial dosage and estimated renal function.  As per policy approved by the Pharmacy & Therapeutics and Medical Executive Committees, the antimicrobial dosage will be adjusted accordingly.  Current antimicrobial dosage:  meropenem 1g IV q12h  Indication: ESBL UTI  Renal Function:  Estimated Creatinine Clearance: 58 mL/min (by C-G formula based on SCr of 1.17 mg/dL). []      On intermittent HD, scheduled: []      On CRRT    Antimicrobial dosage has been changed to:  1g IV q8h  Additional comments:   Thank you for allowing pharmacy to be a part of this patient's care.  Loralee Pacas, PharmD, BCPS 11/28/2023 9:07 PM

## 2023-11-28 NOTE — Plan of Care (Signed)
  Problem: Nutritional: Goal: Maintenance of adequate nutrition will improve Outcome: Progressing   Problem: Nutritional: Goal: Progress toward achieving an optimal weight will improve Outcome: Progressing   Problem: Skin Integrity: Goal: Risk for impaired skin integrity will decrease Outcome: Progressing

## 2023-11-28 NOTE — Telephone Encounter (Signed)
No I would send Mychart message and close encounter

## 2023-11-29 DIAGNOSIS — A419 Sepsis, unspecified organism: Secondary | ICD-10-CM | POA: Diagnosis not present

## 2023-11-29 DIAGNOSIS — R6520 Severe sepsis without septic shock: Secondary | ICD-10-CM | POA: Diagnosis not present

## 2023-11-29 LAB — GLUCOSE, CAPILLARY
Glucose-Capillary: 229 mg/dL — ABNORMAL HIGH (ref 70–99)
Glucose-Capillary: 290 mg/dL — ABNORMAL HIGH (ref 70–99)
Glucose-Capillary: 231 mg/dL — ABNORMAL HIGH (ref 70–99)
Glucose-Capillary: 272 mg/dL — ABNORMAL HIGH (ref 70–99)

## 2023-11-29 MED ORDER — HALOPERIDOL LACTATE 5 MG/ML IJ SOLN
2.0000 mg | Freq: Once | INTRAMUSCULAR | Status: AC | PRN
  Administered 2023-11-29: 2 mg via INTRAVENOUS
  Filled 2023-11-29: qty 1

## 2023-11-29 NOTE — Progress Notes (Addendum)
PROGRESS NOTE  Karl Lawson.  ZOX:[id number] DOB: 1946-01-29 DOA: 11/23/2023 PCP: Mliss Sax, MD   Brief Narrative: Patient is a 78 year old male with history of Parkinson disease, diabetes type 2, complete heart block status post pacemaker placement, COPD, hypertension who was brought here from home for evaluation of increased confusion, weakness, dysuria.He lives alone.  On presentation, he was hypotensive ,tachycardiac, tachypneic.  Lab work showed WC count of 22,000, sodium of 124, creatinine of 1.49.  UA suspicious for UTI.  Patient was started on ceftriaxone.  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  Started on Flomax.  Urine culture showed ESBL Klebsiella pneumoniae, started on meropenem.  Due to urinary retention, Foley placed.Marland Kitchen  Hospital course remarkable for agitation now calmer .  PT recommending SNF.TOC following.  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  Currently hemodynamically stable.  Afebrile.  Sepsis physiology has resolved.  Blood cultures did not show any growth.  Urine culture showing Klebsiella pneumonia.  ESBL UTI: Urine culture showed Klebsiella pneumoniae, ESBL.  Currently on meropenem.  Plan for 5 days course,day 4/5  Acute renal failure and CKD stage IIIb/urine retention: Baseline creatinine  fluctuating from 1.2-1.5.  Creatinine worsened so Foley placed.  Renal ultrasound did not show any obstruction.  Creatinine normalized now,foley removed.   Continue Flomax.  Third-degree AV block: Status post pacemaker placement  COPD: Currently not in exacerbation.  Continue bronchodilators as needed.On room air  Hypertension: Blood pressure currently stable.  Home losartan chlorthalidone on hold for now, will resume if blood pressure trends outpatient  Parkinson's disease:On  Sinemet.  Hospital course remarkable for agitation.  This morning he was calm and comfortable, alert and oriented  Hyperlipidemia: On Lipitor  Type 2 diabetes: On insulin at home.  Patient A1c of 6.1.  Continue current insulin regimen  Vitamin B-12 deficiency: Low at 55.  Given few doses of  IM supplementation.  Continue oral supplementation daily.  Debility/deconditioning: PT/OT recommending SNF on discharge.  TOC following.        DVT prophylaxis:enoxaparin (LOVENOX) injection 40 mg Start: 11/24/23 1000     Code Status: Limited: Do not attempt resuscitation (DNR) -DNR-LIMITED -Do Not Intubate/DNI   Family Communication: None at the bedside.I called his son Mr. Noa Laskin on phone for update on 1/22  Patient status:Inpatient  Patient is from :home  Anticipated discharge to:SNF  Estimated DC date:on 1/24   Consultants: None  Procedures:none  Antimicrobials:  Anti-infectives (From admission, onward)    Start     Dose/Rate Route Frequency Ordered Stop   11/29/23 0200  meropenem (MERREM) 1 g in sodium chloride 0.9 % 100 mL IVPB        1 g 200 mL/hr over 30 Minutes Intravenous Every 8 hours 11/28/23 2108     11/26/23 1600  meropenem (MERREM) 1 g in sodium chloride 0.9 % 100 mL IVPB  Status:  Discontinued        1 g 200 mL/hr over 30 Minutes Intravenous Every 12 hours 11/26/23 1453 11/28/23 2108   11/24/23 2300  cefTRIAXone (ROCEPHIN) 2 g in sodium chloride 0.9 % 100 mL IVPB  Status:   Discontinued        2 g 200 mL/hr over 30 Minutes Intravenous Every 24 hours 11/24/23 0441 11/26/23 1453   11/23/23 2230  cefTRIAXone (ROCEPHIN) 2 g in sodium chloride 0.9 % 100 mL IVPB        2 g 200 mL/hr over 30 Minutes Intravenous Once 11/23/23 2224 11/24/23 0004       Subjective: Patient seen and examined at bedside today.  He looks comfortable this morning.  Lying in bed.  As per the report, he was agitated and confused last night.  During my evaluation he was alert and oriented.  Foley has been removed and he is voiding by himself.  Not in any kind of distress.  Objective: Vitals:   11/28/23 1957 11/28/23 2344 11/29/23 0417 11/29/23 0811  BP: 128/76 121/63 135/81 (!) 146/57  Pulse: (!) 105 67 100 90  Resp: 18 18 18    Temp: 98.2 F (36.8 C) (!) 97.4 F (36.3 C) 97.8 F (36.6 C) 97.9 F (36.6 C)  TempSrc: Oral Oral Oral Oral  SpO2: 96% 100% 96% 95%  Weight:      Height:        Intake/Output Summary (Last 24 hours) at 11/29/2023 1107 Last data filed at 11/29/2023 0400 Gross per 24 hour  Intake 1091.43 ml  Output 950 ml  Net 141.43 ml   Filed Weights   11/23/23 2100  Weight: 94.3 kg    Examination:  General exam: Overall comfortable, not in distress,obese,weak appearing HEENT: PERRL Respiratory system:  no wheezes or crackles  Cardiovascular system: S1 & S2 heard, RRR.  Gastrointestinal system: Abdomen is nondistended, soft and nontender. Central nervous system: Alert and oriented Extremities: No edema, no clubbing ,no cyanosis Skin: No rashes, no ulcers,no icterus     Data Reviewed: I have personally reviewed following labs and imaging studies  CBC: Recent Labs  Lab 11/23/23 2246 11/24/23 0837 11/25/23 0849 11/26/23 0653 11/27/23 0914  WBC 22.3* 19.9* 16.1* 11.6* 9.8  NEUTROABS 18.5* 17.4*  --   --   --   HGB 13.3 13.9 12.5* 11.6* 11.5*  HCT 39.8 40.9 35.6* 34.1* 33.5*  MCV 79.6* 79.7* 76.7* 77.9* 78.6*  PLT 253 244 251 212 226   Basic  Metabolic Panel: Recent Labs  Lab 11/24/23 0837 11/25/23 0849 11/26/23 0653 11/27/23 0914 11/28/23 0843  NA 127* 124* 127* 131* 135  K 3.8 3.6 3.4* 3.4* 3.6  CL 93* 90* 95* 99 101  CO2 21* 20* 20* 21* 24  GLUCOSE 190* 229* 209* 211* 208*  BUN 22 29* 37* 34* 27*  CREATININE 1.23 1.86* 2.24* 1.55* 1.17  CALCIUM 9.8 9.4 8.9 9.1 9.4     Recent Results (from the past 240 hours)  Urine Culture     Status: Abnormal   Collection Time: 11/23/23 10:45 PM   Specimen: Urine, Random  Result Value Ref Range Status   Specimen Description URINE, RANDOM  Final   Special Requests   Final    NONE Reflexed from [id number] Performed at [facility], [address] [place], [place] [address]    Culture (A)  Final    >=100,000 COLONIES/mL KLEBSIELLA PNEUMONIAE Confirmed Extended Spectrum Beta-Lactamase Producer (ESBL).  In bloodstream infections  from ESBL organisms, carbapenems are preferred over piperacillin/tazobactam. They are shown to have a lower risk of mortality.    Report Status 11/26/2023 FINAL  Final   Organism ID, Bacteria KLEBSIELLA PNEUMONIAE (A)  Final      Susceptibility   Klebsiella pneumoniae - MIC*    AMPICILLIN >=32 RESISTANT Resistant     CEFAZOLIN >=64 RESISTANT Resistant     CEFEPIME >=32 RESISTANT Resistant     CEFTRIAXONE >=64 RESISTANT Resistant     CIPROFLOXACIN 2 RESISTANT Resistant     GENTAMICIN >=16 RESISTANT Resistant     IMIPENEM 1 SENSITIVE Sensitive     NITROFURANTOIN 128 RESISTANT Resistant     TRIMETH/SULFA >=320 RESISTANT Resistant     AMPICILLIN/SULBACTAM >=32 RESISTANT Resistant     PIP/TAZO 32 INTERMEDIATE Intermediate ug/mL    * >=100,000 COLONIES/mL KLEBSIELLA PNEUMONIAE  Resp panel by RT-PCR (RSV, Flu A&B, Covid) Anterior Nasal Swab     Status: None   Collection Time: 11/23/23 10:48 PM   Specimen: Anterior Nasal Swab  Result Value Ref Range Status   SARS Coronavirus 2 by RT PCR NEGATIVE NEGATIVE Final   Influenza A by PCR NEGATIVE NEGATIVE  Final   Influenza B by PCR NEGATIVE NEGATIVE Final    Comment: (NOTE) The Xpert Xpress SARS-CoV-2/FLU/RSV plus assay is intended as an aid in the diagnosis of influenza from Nasopharyngeal swab specimens and should not be used as a sole basis for treatment. Nasal washings and aspirates are unacceptable for Xpert Xpress SARS-CoV-2/FLU/RSV testing.  Fact Sheet for Patients: BloggerCourse.com  Fact Sheet for Healthcare Providers: SeriousBroker.it  This test is not yet approved or cleared by the [place] FDA and has been authorized for detection and/or diagnosis of SARS-CoV-2 by FDA under an Emergency Use Authorization (EUA). This EUA will remain in effect (meaning this test can be used) for the duration of the COVID-19 declaration under Section 564(b)(1) of the Act, 21 U.S.C. section 360bbb-3(b)(1), unless the authorization is terminated or revoked.     Resp Syncytial Virus by PCR NEGATIVE NEGATIVE Final    Comment: (NOTE) Fact Sheet for Patients: BloggerCourse.com  Fact Sheet for Healthcare Providers: SeriousBroker.it  This test is not yet approved or cleared by the [place] FDA and has been authorized for detection and/or diagnosis of SARS-CoV-2 by FDA under an Emergency Use Authorization (EUA). This EUA will remain in effect (meaning this test can be used) for the duration of the COVID-19 declaration under Section 564(b)(1) of the Act, 21 U.S.C. section 360bbb-3(b)(1), unless the authorization is terminated or revoked.  Performed at [facility], [address], [place], [place] [address]   Blood Culture (routine x 2)     Status: None   Collection Time: 11/23/23 11:00 PM   Specimen: BLOOD  Result Value Ref Range Status   Specimen Description BLOOD LEFT ANTECUBITAL  Final   Special Requests   Final    BOTTLES DRAWN AEROBIC AND ANAEROBIC Blood Culture  results may not be optimal due to an inadequate volume of blood received in culture bottles   Culture   Final    NO GROWTH 5 DAYS Performed at [facility], [address], [place], [place] [address]    Report Status 11/28/2023 FINAL  Final  Blood Culture (routine x 2)     Status: None   Collection Time: 11/23/23 11:15 PM   Specimen: BLOOD  Result Value Ref Range Status   Specimen Description BLOOD RIGHT ANTECUBITAL  Final   Special  Requests   Final    BOTTLES DRAWN AEROBIC AND ANAEROBIC Blood Culture results may not be optimal due to an inadequate volume of blood received in culture bottles   Culture   Final    NO GROWTH 5 DAYS Performed at [facility], [address], [place], [place] [address]    Report Status 11/28/2023 FINAL  Final     Radiology Studies: No results found.   Scheduled Meds:  aspirin EC  81 mg Oral Daily   atorvastatin  40 mg Oral Daily   carbidopa-levodopa  1.5 tablet Oral TID   Chlorhexidine Gluconate Cloth  6 each Topical Daily   vitamin B-12  1,000 mcg Oral Daily   enoxaparin (LOVENOX) injection  40 mg Subcutaneous Q24H   feeding supplement  237 mL Oral BID BM   finasteride  5 mg Oral Daily   insulin aspart  0-9 Units Subcutaneous TID WC   insulin glargine-yfgn  15 Units Subcutaneous Daily   pantoprazole  40 mg Oral Daily   tamsulosin  0.4 mg Oral QPC supper   Continuous Infusions:  meropenem (MERREM) IV 1 g (11/29/23 0924)     LOS: 5 days   Burnadette Pop, MD [facility] P1/22/2025, 11:07 AM

## 2023-11-29 NOTE — Plan of Care (Signed)

## 2023-11-29 NOTE — Progress Notes (Signed)
Occupational Therapy Treatment Patient Details Name: Karl Lawson. MRN: [id number] DOB: July 24, 1946 Today's Date: 11/29/2023   History of present illness 78 yo male presents to [facility] on 11/23/23 w/ more frequent falls and burning micturition. Admitted w/ sepsis likely 2/2 UTI, acute renal failure, and hyponatremia. CT head and cervical spine neg. PMHx: AKI, PMH includes obesity, DM2, HTN, HLD, COPD, GERD, Parkinson disease.   OT comments  Pt progressing towards goals this session, needing set up - CGA for ADLs, able to perform toileting and x2 standing grooming tasks during session. Pt  CGA for transfers with RW, cues for hand positioning and to remain upright. Pt flexing trunk while at sink due to fatigue. HR 100-110. Pt presenting with impairments listed below, will follow acutely. Patient will benefit from continued inpatient follow up therapy, <3 hours/day to maximize safety/ind with ADL/functional mobility.       If plan is discharge home, recommend the following:  Two people to help with walking and/or transfers;A lot of help with bathing/dressing/bathroom;Assistance with cooking/housework;Direct supervision/assist for financial management;Direct supervision/assist for medications management;Assist for transportation;Help with stairs or ramp for entrance   Equipment Recommendations       Recommendations for Other Services PT consult    Precautions / Restrictions Precautions Precautions: Fall Restrictions Weight Bearing Restrictions Per Provider Order: No       Mobility Bed Mobility               General bed mobility comments: OOB in bathroom upon arrival    Transfers Overall transfer level: Needs assistance Equipment used: Rolling walker (2 wheels) Transfers: Sit to/from Stand, Bed to chair/wheelchair/BSC Sit to Stand: Contact guard assist                 Balance Overall balance assessment: Needs assistance, History of Falls Sitting-balance support:  Bilateral upper extremity supported, Feet supported Sitting balance-Leahy Scale: Good     Standing balance support: Bilateral upper extremity supported, During functional activity, Reliant on assistive device for balance Standing balance-Leahy Scale: Poor Standing balance comment: with RW support                           ADL either performed or assessed with clinical judgement   ADL Overall ADL's : Needs assistance/impaired     Grooming: Set up;Standing;Wash/dry hands Grooming Details (indicate cue type and reason): soaking dentures in cup                 Toilet Transfer: Contact guard assist;Ambulation;Rolling walker (2 wheels) Toilet Transfer Details (indicate cue type and reason): amb from toilet to chair Toileting- Clothing Manipulation and Hygiene: Supervision/safety;Sitting/lateral lean Toileting - Clothing Manipulation Details (indicate cue type and reason): pericare in sitting     Functional mobility during ADLs: Contact guard assist;Rolling walker (2 wheels)      Extremity/Trunk Assessment Upper Extremity Assessment Upper Extremity Assessment: Generalized weakness   Lower Extremity Assessment Lower Extremity Assessment: Defer to PT evaluation        Vision   Additional Comments: hx of macular degeneration   Perception Perception Perception: Not tested   Praxis Praxis Praxis: Not tested    Cognition Arousal: Alert Behavior During Therapy: Flat affect, WFL for tasks assessed/performed Overall Cognitive Status: Impaired/Different from baseline Area of Impairment: Orientation, Attention, Memory, Following commands, Safety/judgement, Awareness, Problem solving                   Current Attention Level: Focused  Following Commands: Follows one step commands consistently, Follows one step commands with increased time     Problem Solving: Slow processing, Decreased initiation, Difficulty sequencing, Requires verbal cues, Requires  tactile cues General Comments: incr time to perform BADL tasks        Exercises      Shoulder Instructions       General Comments VSS, sister in law present during session    Pertinent Vitals/ Pain       Pain Assessment Pain Assessment: No/denies pain  Home Living                                          Prior Functioning/Environment              Frequency  Min 1X/week        Progress Toward Goals  OT Goals(current goals can now be found in the care plan section)  Progress towards OT goals: Progressing toward goals  Acute Rehab OT Goals Patient Stated Goal: none stated OT Goal Formulation: With patient Time For Goal Achievement: 12/09/23 Potential to Achieve Goals: Good ADL Goals Pt Will Perform Upper Body Dressing: sitting;with contact guard assist Pt Will Perform Lower Body Dressing: with contact guard assist;sitting/lateral leans;sit to/from stand Pt Will Transfer to Toilet: with contact guard assist;ambulating;regular height toilet  Plan      Co-evaluation                 AM-PAC OT "6 Clicks" Daily Activity     Outcome Measure   Help from another person eating meals?: A Little Help from another person taking care of personal grooming?: A Little Help from another person toileting, which includes using toliet, bedpan, or urinal?: A Little Help from another person bathing (including washing, rinsing, drying)?: A Lot Help from another person to put on and taking off regular upper body clothing?: A Little Help from another person to put on and taking off regular lower body clothing?: A Lot 6 Click Score: 16    End of Session Equipment Utilized During Treatment: Gait belt;Rolling walker (2 wheels)  OT Visit Diagnosis: Unsteadiness on feet (R26.81);Other abnormalities of gait and mobility (R26.89);Muscle weakness (generalized) (M62.81);History of falling (Z91.81)   Activity Tolerance Patient tolerated treatment well    Patient Left in chair;with call bell/phone within reach;with chair alarm set   Nurse Communication Mobility status        Time: 1440-1500 OT Time Calculation (min): 20 min  Charges: OT General Charges $OT Visit: 1 Visit OT Treatments $Self Care/Home Management : 8-22 mins  Carver Fila, OTD, OTR/L SecureChat Preferred Acute Rehab [phone]   Carver Fila Koonce 11/29/2023, 3:12 PM

## 2023-11-30 ENCOUNTER — Ambulatory Visit: Payer: Medicare Other | Admitting: Neurology

## 2023-11-30 DIAGNOSIS — A419 Sepsis, unspecified organism: Secondary | ICD-10-CM | POA: Diagnosis not present

## 2023-11-30 DIAGNOSIS — R6520 Severe sepsis without septic shock: Secondary | ICD-10-CM | POA: Diagnosis not present

## 2023-11-30 LAB — HEMOGLOBIN A1C
Hgb A1c MFr Bld: 7.2 % — ABNORMAL HIGH (ref 4.8–5.6)
Mean Plasma Glucose: 159.94 mg/dL

## 2023-11-30 LAB — GLUCOSE, CAPILLARY
Glucose-Capillary: 253 mg/dL — ABNORMAL HIGH (ref 70–99)
Glucose-Capillary: 208 mg/dL — ABNORMAL HIGH (ref 70–99)
Glucose-Capillary: 260 mg/dL — ABNORMAL HIGH (ref 70–99)
Glucose-Capillary: 168 mg/dL — ABNORMAL HIGH (ref 70–99)

## 2023-11-30 MED ORDER — QUETIAPINE FUMARATE 25 MG PO TABS
25.0000 mg | ORAL_TABLET | Freq: Every day | ORAL | Status: DC
  Administered 2023-11-30 – 2023-12-03 (×4): 25 mg via ORAL
  Filled 2023-11-30 (×4): qty 1

## 2023-11-30 MED ORDER — HALOPERIDOL LACTATE 5 MG/ML IJ SOLN
5.0000 mg | Freq: Once | INTRAMUSCULAR | Status: AC | PRN
  Administered 2023-11-30: 5 mg via INTRAVENOUS
  Filled 2023-11-30: qty 1

## 2023-11-30 NOTE — Progress Notes (Signed)
TRH night cross cover note:  I was notified by RN that this patient is agitated, pulling at their telemetry leads, and repeatedly getting out of bed, with these behaviors refractory to attempts at verbal redirection.  In the setting of associated interference with ongoing medical treatment posing potential harm to themself, I have placed order for haldol 5 mg iv x 1 dose prn for agitation.    Newton Pigg, DO Hospitalist

## 2023-11-30 NOTE — Plan of Care (Signed)

## 2023-11-30 NOTE — TOC Progression Note (Addendum)
Transition of Care (TOC) - Progression Note    Patient Details  Name: Karl Lawson. MRN: [id number] Date of Birth: [birth year]  Transition of Care [facility]) CM/SW Contact  Agron Swiney A [place], [place] Phone Number: 11/30/2023, 2:34 PM  Clinical Narrative:     Update 12/01/23   Pt's authorization was approved for [employer].  Auth ID: [id number] Reference ID: [id number] Approval Dates:  1/23 - 1/27 NRD 1/27 P   However, pt unable to go to facility today or over weekend due to Haldol injection overnight on 11/30/23. Facility requested possible DC for Monday if no agitation or behaviors. Provider notified.     Insurance authorization request started for [facility] for possible DC tomorrow. Status pending. Auth ID: [id number]    TOC will continue to follow.    Expected Discharge Plan: Skilled Nursing Facility Barriers to Discharge: Continued Medical Work up, SNF Pending bed offer  Expected Discharge Plan and Services In-house Referral: Clinical Social Work   Post Acute Care Choice: Skilled Nursing Facility Living arrangements for the past 2 months: Single Family Home                                       Social Determinants of Health (SDOH) Interventions SDOH Screenings   Food Insecurity: No Food Insecurity (11/24/2023)  Housing: Low Risk  (11/24/2023)  Transportation Needs: No Transportation Needs (11/24/2023)  Utilities: Not At Risk (11/24/2023)  Alcohol Screen: Low Risk  (11/14/2023)  Depression (PHQ2-9): Low Risk  (11/15/2023)  Financial Resource Strain: Low Risk  (11/14/2023)  Physical Activity: Sufficiently Active (11/14/2023)  Social Connections: Moderately Integrated (11/24/2023)  Stress: No Stress Concern Present (05/09/2023)  Tobacco Use: Medium Risk (11/24/2023)  Health Literacy: Adequate Health Literacy (09/19/2023)    Readmission Risk Interventions     No data to display

## 2023-11-30 NOTE — Progress Notes (Signed)
Physical Therapy Treatment Patient Details Name: Karl Lawson. MRN: [id number] DOB: January 04, 1946 Today's Date: 11/30/2023   History of Present Illness 78 yo male presents to [facility] on 11/23/23 w/ more frequent falls and burning micturition. Admitted w/ sepsis likely 2/2 UTI, acute renal failure, and hyponatremia. CT head and cervical spine neg. PMHx: AKI, PMH includes obesity, DM2, HTN, HLD, COPD, GERD, Parkinson disease.    PT Comments  Patient resting in recliner at start of session and agreeable to mobilize with therapy. Pt completed repeated sit<>stands for functional LE strengthening, CGA provided for safety and pt reliant on bil UE to power up. Pt progress gait training today with 2x10' fwd/bkwd steps and then amb ~25' to bathroom. Pt requires min assist to power up from lower surface of toilet. EOS pt agreeable to remain in recliner to maximize time OOB. Alarm on and call bell within reach. Will continue to progress pt as able during stay.     If plan is discharge home, recommend the following: A little help with walking and/or transfers;A little help with bathing/dressing/bathroom;Assistance with cooking/housework;Assist for transportation;Help with stairs or ramp for entrance;Supervision due to cognitive status   Can travel by private vehicle     Yes  Equipment Recommendations  None recommended by PT    Recommendations for Other Services       Precautions / Restrictions Precautions Precautions: Fall Restrictions Weight Bearing Restrictions Per Provider Order: No     Mobility  Bed Mobility Overal bed mobility: Needs Assistance             General bed mobility comments: pt OOB in recliner    Transfers Overall transfer level: Needs assistance Equipment used: Rolling walker (2 wheels) Transfers: Sit to/from Stand Sit to Stand: Contact guard assist, Min assist           General transfer comment: pt reliant on bil UE to power up, CGA for safety with rise from  relciner to RW. pt using grab bar in bathroom and min assist for rise from low toilet height.    Ambulation/Gait Ambulation/Gait assistance: Min assist Gait Distance (Feet): 25 Feet Assistive device: Rolling walker (2 wheels) Gait Pattern/deviations: Step-through pattern, Decreased stride length, Shuffle, Trunk flexed Gait velocity: decr     General Gait Details: shuffled steps, min assist to steady and manage walker proximity.   Stairs             Wheelchair Mobility     Tilt Bed    Modified Rankin (Stroke Patients Only)       Balance Overall balance assessment: Needs assistance, History of Falls Sitting-balance support: Bilateral upper extremity supported, Feet supported Sitting balance-Leahy Scale: Good     Standing balance support: Bilateral upper extremity supported, During functional activity, Reliant on assistive device for balance                                Cognition Arousal: Alert Behavior During Therapy: WFL for tasks assessed/performed Overall Cognitive Status: Impaired/Different from baseline                     Current Attention Level: Selective   Following Commands: Follows one step commands consistently, Follows multi-step commands with increased time                Exercises      General Comments        Pertinent Vitals/Pain Pain Assessment Pain  Assessment: No/denies pain    Home Living                          Prior Function            PT Goals (current goals can now be found in the care plan section) Acute Rehab PT Goals Patient Stated Goal: unable to participate PT Goal Formulation: Patient unable to participate in goal setting Time For Goal Achievement: 12/09/23 Potential to Achieve Goals: Fair Progress towards PT goals: Progressing toward goals    Frequency    Min 1X/week      PT Plan      Co-evaluation              AM-PAC PT "6 Clicks" Mobility   Outcome  Measure  Help needed turning from your back to your side while in a flat bed without using bedrails?: A Little Help needed moving from lying on your back to sitting on the side of a flat bed without using bedrails?: A Little Help needed moving to and from a bed to a chair (including a wheelchair)?: A Little Help needed standing up from a chair using your arms (e.g., wheelchair or bedside chair)?: A Little Help needed to walk in hospital room?: A Little Help needed climbing 3-5 steps with a railing? : Total 6 Click Score: 16    End of Session Equipment Utilized During Treatment: Gait belt Activity Tolerance: Patient limited by fatigue;Patient tolerated treatment well Patient left: with call bell/phone within reach;in chair;with chair alarm set Nurse Communication: Mobility status PT Visit Diagnosis: Repeated falls (R29.6);Muscle weakness (generalized) (M62.81);Unsteadiness on feet (R26.81)     Time: 2952-8413 PT Time Calculation (min) (ACUTE ONLY): 28 min  Charges:    $Gait Training: 8-22 mins $Therapeutic Activity: 8-22 mins PT General Charges $$ ACUTE PT VISIT: 1 Visit                     Wynn Maudlin, DPT Acute Rehabilitation Services Office [phone]  11/30/23 1:07 PM

## 2023-11-30 NOTE — Progress Notes (Signed)
Mobility Specialist: Progress Note   11/30/23 1104  Mobility  Activity Transferred from bed to chair  Level of Assistance Contact guard assist, steadying assist  Assistive Device Front wheel walker  Activity Response Tolerated well  Mobility Referral Yes  Mobility visit 1 Mobility  Mobility Specialist Start Time (ACUTE ONLY) 0954  Mobility Specialist Stop Time (ACUTE ONLY) 1008  Mobility Specialist Time Calculation (min) (ACUTE ONLY) 14 min    Pt was agreeable to mobility session - received in bed. No complaints. CG throughout. Left in chair with all needs met, call bell in reach.   Maurene Capes Mobility Specialist Please contact via SecureChat or Rehab office at [phone]

## 2023-11-30 NOTE — Progress Notes (Addendum)
PROGRESS NOTE  Karl Lawson.  LOV:[id number] DOB: 1946/03/30 DOA: 11/23/2023 PCP: Mliss Sax, MD   Brief Narrative: Patient is a 79 year old male with history of Parkinson disease, diabetes type 2, complete heart block status post pacemaker placement, COPD, hypertension who was brought here from home for evaluation of increased confusion, weakness, dysuria.He lives alone.  On presentation, he was hypotensive ,tachycardiac, tachypneic.  Lab work showed WC count of 22,000, sodium of 124, creatinine of 1.49.  UA suspicious for UTI.  Patient was started on ceftriaxone.  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  Started on Flomax.  Urine culture showed ESBL Klebsiella pneumoniae, started on meropenem.  Due to urinary retention, Foley placed.Marland Kitchen  Hospital course remarkable for agitation now calmer .  PT recommending SNF.TOC following.  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  Currently hemodynamically stable.  Afebrile.  Sepsis physiology has resolved.  Blood cultures did not show any growth.  Urine culture showing Klebsiella pneumonia.  ESBL UTI: Urine culture showed Klebsiella pneumoniae, ESBL.  Currently on meropenem.  Plan for 5 days course,day 4/5  Acute renal failure and CKD stage IIIb/urine retention: Baseline creatinine  fluctuating from 1.2-1.5.  Creatinine worsened so Foley placed.  Renal ultrasound did not show any obstruction.  Creatinine normalized now,foley removed.   Continue Flomax.  Third-degree AV block: Status post pacemaker placement  COPD: Currently not in exacerbation.  Continue bronchodilators as needed.On room air  Hypertension: Blood pressure currently stable.  Home losartan chlorthalidone on hold for now, will resume if blood pressure trends up  Parkinson's disease:On  Sinemet.  Hospital course remarkable for intermittent agitation.  This morning he was calm and comfortable, alert and mostly oriented.  Continue Sinemet.  Started on low-dose Seroquel  Hyperlipidemia: On Lipitor  Type 2 diabetes: On sliding scale, metformin, Jardiance at home.   A1c of 7.2.  Continue current insulin regimen.  Will resume home medications on discharge.  Diabetic coordinator also consulted for persistent hyperglycemia.  Vitamin B-12 deficiency: Low at 55.  Given few doses of  IM supplementation.  Continue oral supplementation daily.  Debility/deconditioning: PT/OT recommending SNF on discharge.  TOC following.        DVT prophylaxis:enoxaparin (LOVENOX) injection 40 mg Start: 11/24/23 1000     Code Status: Limited: Do not attempt resuscitation (DNR) -DNR-LIMITED -Do Not Intubate/DNI   Family Communication: Called and discussed with his son Mr. Aldyn Swenor on phone on 1/22  Patient status:Inpatient  Patient is from :home  Anticipated discharge to:SNF  Estimated DC date:on 1/24   Consultants: None  Procedures:none  Antimicrobials:  Anti-infectives (From admission, onward)    Start     Dose/Rate Route Frequency Ordered Stop   11/29/23 0200  meropenem (MERREM) 1 g in sodium chloride 0.9 % 100 mL IVPB        1 g 200 mL/hr over 30 Minutes  Intravenous Every 8 hours 11/28/23 2108 12/01/23 1759   11/26/23 1600  meropenem (MERREM) 1 g in sodium chloride 0.9 % 100 mL IVPB  Status:  Discontinued         1 g 200 mL/hr over 30 Minutes Intravenous Every 12 hours 11/26/23 1453 11/28/23 2108   11/24/23 2300  cefTRIAXone (ROCEPHIN) 2 g in sodium chloride 0.9 % 100 mL IVPB  Status:  Discontinued        2 g 200 mL/hr over 30 Minutes Intravenous Every 24 hours 11/24/23 0441 11/26/23 1453   11/23/23 2230  cefTRIAXone (ROCEPHIN) 2 g in sodium chloride 0.9 % 100 mL IVPB        2 g 200 mL/hr over 30 Minutes Intravenous Once 11/23/23 2224 11/24/23 0004       Subjective: Patient seen and examined at bedside today.  Hemodynamically stable.  Lying in bed.  Appears comfortable.  As per report, he was agitated last night and was given Haldol.  During evaluation, he was calm and cooperative, knows current month.  Obeys commands  Objective: Vitals:   11/29/23 1719 11/29/23 2055 11/30/23 0012 11/30/23 0853  BP: 137/88 (!) 122/52 (!) 130/56 (!) 160/74  Pulse: 90 67 97 87  Resp: 16 18 18 16   Temp: 98.1 F (36.7 C) 98.3 F (36.8 C) 98.2 F (36.8 C) 97.9 F (36.6 C)  TempSrc: Oral     SpO2: 96% 99% 96% 98%  Weight:      Height:        Intake/Output Summary (Last 24 hours) at 11/30/2023 1035 Last data filed at 11/29/2023 1603 Gross per 24 hour  Intake 430.39 ml  Output --  Net 430.39 ml   Filed Weights   11/23/23 2100  Weight: 94.3 kg    Examination:    General exam: Overall comfortable, not in distress,obese, deconditioned HEENT: PERRL Respiratory system:  no wheezes or crackles  Cardiovascular system: S1 & S2 heard, RRR.  Gastrointestinal system: Abdomen is nondistended, soft and nontender. Central nervous system: Alert and awake, oriented to place, knows current month Extremities: No edema, no clubbing ,no cyanosis Skin: No rashes, no ulcers,no icterus     Data Reviewed: I have personally reviewed following labs and imaging studies  CBC: Recent Labs  Lab 11/23/23 2246 11/24/23 0837 11/25/23 0849 11/26/23 0653 11/27/23 0914  WBC 22.3* 19.9* 16.1* 11.6* 9.8  NEUTROABS  18.5* 17.4*  --   --   --   HGB 13.3 13.9 12.5* 11.6* 11.5*  HCT 39.8 40.9 35.6* 34.1* 33.5*  MCV 79.6* 79.7* 76.7* 77.9* 78.6*  PLT 253 244 251 212 226   Basic Metabolic Panel: Recent Labs  Lab 11/24/23 0837 11/25/23 0849 11/26/23 0653 11/27/23 0914 11/28/23 0843  NA 127* 124* 127* 131* 135  K 3.8 3.6 3.4* 3.4* 3.6  CL 93* 90* 95* 99 101  CO2 21* 20* 20* 21* 24  GLUCOSE 190* 229* 209* 211* 208*  BUN 22 29* 37* 34* 27*  CREATININE 1.23 1.86* 2.24* 1.55* 1.17  CALCIUM 9.8 9.4 8.9 9.1 9.4     Recent Results (from the past 240 hours)  Urine Culture     Status: Abnormal   Collection Time: 11/23/23 10:45 PM   Specimen: Urine, Random  Result Value Ref Range Status   Specimen Description URINE, RANDOM  Final   Special Requests   Final    NONE Reflexed from [id number] Performed at [facility], [address], [place], [place] [address]  Culture (A)  Final    >=100,000 COLONIES/mL KLEBSIELLA PNEUMONIAE Confirmed Extended Spectrum Beta-Lactamase Producer (ESBL).  In bloodstream infections from ESBL organisms, carbapenems are preferred over piperacillin/tazobactam. They are shown to have a lower risk of mortality.    Report Status 11/26/2023 FINAL  Final   Organism ID, Bacteria KLEBSIELLA PNEUMONIAE (A)  Final      Susceptibility   Klebsiella pneumoniae - MIC*    AMPICILLIN >=32 RESISTANT Resistant     CEFAZOLIN >=64 RESISTANT Resistant     CEFEPIME >=32 RESISTANT Resistant     CEFTRIAXONE >=64 RESISTANT Resistant     CIPROFLOXACIN 2 RESISTANT Resistant     GENTAMICIN >=16 RESISTANT Resistant     IMIPENEM 1 SENSITIVE Sensitive     NITROFURANTOIN 128 RESISTANT Resistant     TRIMETH/SULFA >=320 RESISTANT Resistant     AMPICILLIN/SULBACTAM >=32 RESISTANT Resistant     PIP/TAZO 32 INTERMEDIATE Intermediate ug/mL    * >=100,000 COLONIES/mL KLEBSIELLA PNEUMONIAE  Resp panel by RT-PCR (RSV, Flu A&B, Covid) Anterior Nasal Swab     Status: None   Collection Time: 11/23/23  10:48 PM   Specimen: Anterior Nasal Swab  Result Value Ref Range Status   SARS Coronavirus 2 by RT PCR NEGATIVE NEGATIVE Final   Influenza A by PCR NEGATIVE NEGATIVE Final   Influenza B by PCR NEGATIVE NEGATIVE Final    Comment: (NOTE) The Xpert Xpress SARS-CoV-2/FLU/RSV plus assay is intended as an aid in the diagnosis of influenza from Nasopharyngeal swab specimens and should not be used as a sole basis for treatment. Nasal washings and aspirates are unacceptable for Xpert Xpress SARS-CoV-2/FLU/RSV testing.  Fact Sheet for Patients: BloggerCourse.com  Fact Sheet for Healthcare Providers: SeriousBroker.it  This test is not yet approved or cleared by the [place] FDA and has been authorized for detection and/or diagnosis of SARS-CoV-2 by FDA under an Emergency Use Authorization (EUA). This EUA will remain in effect (meaning this test can be used) for the duration of the COVID-19 declaration under Section 564(b)(1) of the Act, 21 U.S.C. section 360bbb-3(b)(1), unless the authorization is terminated or revoked.     Resp Syncytial Virus by PCR NEGATIVE NEGATIVE Final    Comment: (NOTE) Fact Sheet for Patients: BloggerCourse.com  Fact Sheet for Healthcare Providers: SeriousBroker.it  This test is not yet approved or cleared by the [place] FDA and has been authorized for detection and/or diagnosis of SARS-CoV-2 by FDA under an Emergency Use Authorization (EUA). This EUA will remain in effect (meaning this test can be used) for the duration of the COVID-19 declaration under Section 564(b)(1) of the Act, 21 U.S.C. section 360bbb-3(b)(1), unless the authorization is terminated or revoked.  Performed at [facility], [address], [place], [place] [address]   Blood Culture (routine x 2)     Status: None   Collection Time: 11/23/23 11:00 PM   Specimen: BLOOD   Result Value Ref Range Status   Specimen Description BLOOD LEFT ANTECUBITAL  Final   Special Requests   Final    BOTTLES DRAWN AEROBIC AND ANAEROBIC Blood Culture results may not be optimal due to an inadequate volume of blood received in culture bottles   Culture   Final    NO GROWTH 5 DAYS Performed at [facility], [address] [place], [place] [address]    Report Status 11/28/2023 FINAL  Final  Blood Culture (routine x 2)     Status: None   Collection Time: 11/23/23 11:15 PM  Specimen: BLOOD  Result Value Ref Range Status   Specimen Description BLOOD RIGHT ANTECUBITAL  Final   Special Requests   Final    BOTTLES DRAWN AEROBIC AND ANAEROBIC Blood Culture results may not be optimal due to an inadequate volume of blood received in culture bottles   Culture   Final    NO GROWTH 5 DAYS Performed at [facility], [address], [place], [place] [address]    Report Status 11/28/2023 FINAL  Final     Radiology Studies: No results found.   Scheduled Meds:  aspirin EC  81 mg Oral Daily   atorvastatin  40 mg Oral Daily   carbidopa-levodopa  1.5 tablet Oral TID   Chlorhexidine Gluconate Cloth  6 each Topical Daily   vitamin B-12  1,000 mcg Oral Daily   enoxaparin (LOVENOX) injection  40 mg Subcutaneous Q24H   feeding supplement  237 mL Oral BID BM   finasteride  5 mg Oral Daily   insulin aspart  0-9 Units Subcutaneous TID WC   insulin glargine-yfgn  15 Units Subcutaneous Daily   pantoprazole  40 mg Oral Daily   QUEtiapine  25 mg Oral QHS   tamsulosin  0.4 mg Oral QPC supper   Continuous Infusions:  meropenem (MERREM) IV 1 g (11/30/23 0259)     LOS: 6 days   Burnadette Pop, MD [facility] P1/23/2025, 10:35 AM

## 2023-12-01 DIAGNOSIS — A419 Sepsis, unspecified organism: Secondary | ICD-10-CM | POA: Diagnosis not present

## 2023-12-01 DIAGNOSIS — R6520 Severe sepsis without septic shock: Secondary | ICD-10-CM | POA: Diagnosis not present

## 2023-12-01 LAB — BASIC METABOLIC PANEL
Anion gap: 13 (ref 5–15)
BUN: 29 mg/dL — ABNORMAL HIGH (ref 8–23)
CO2: 27 mmol/L (ref 22–32)
Calcium: 9.8 mg/dL (ref 8.9–10.3)
Chloride: 100 mmol/L (ref 98–111)
Creatinine, Ser: 1.51 mg/dL — ABNORMAL HIGH (ref 0.61–1.24)
GFR, Estimated: 47 mL/min — ABNORMAL LOW (ref >60)
Glucose, Bld: 204 mg/dL — ABNORMAL HIGH (ref 70–99)
Potassium: 3.8 mmol/L (ref 3.5–5.1)
Sodium: 140 mmol/L (ref 135–145)

## 2023-12-01 LAB — GLUCOSE, CAPILLARY
Glucose-Capillary: 267 mg/dL — ABNORMAL HIGH (ref 70–99)
Glucose-Capillary: 243 mg/dL — ABNORMAL HIGH (ref 70–99)
Glucose-Capillary: 269 mg/dL — ABNORMAL HIGH (ref 70–99)

## 2023-12-01 MED ORDER — INSULIN GLARGINE-YFGN 100 UNIT/ML ~~LOC~~ SOLN
20.0000 [IU] | Freq: Every day | SUBCUTANEOUS | Status: DC
Start: 2023-12-02 — End: 2023-12-04
  Administered 2023-12-02 – 2023-12-04 (×3): 20 [IU] via SUBCUTANEOUS
  Filled 2023-12-01 (×3): qty 0.2

## 2023-12-01 MED ORDER — HALOPERIDOL LACTATE 5 MG/ML IJ SOLN
5.0000 mg | Freq: Four times a day (QID) | INTRAMUSCULAR | Status: DC | PRN
  Administered 2023-12-01: 5 mg via INTRAVENOUS
  Filled 2023-12-01: qty 1

## 2023-12-01 NOTE — Progress Notes (Signed)
Occupational Therapy Treatment Patient Details Name: Karl Lawson. MRN: [id number] DOB: 06/23/1946 Today's Date: 12/01/2023   History of present illness 78 yo male presents to [facility] on 11/23/23 w/ more frequent falls and burning micturition. Admitted w/ sepsis likely 2/2 UTI, acute renal failure, and hyponatremia. CT head and cervical spine neg. PMHx: AKI, PMH includes obesity, DM2, HTN, HLD, COPD, GERD, Parkinson disease.   OT comments  Patient progressing towards goals. Patient seen with sister in law present, and patient noteably drowsy (suspect due to medications as patient had received halidol overnight). Patient able to complete bed mobility at CGA, and sit<>stands with CGA to doff and don new brief (Mod A for lower body management). Given patient's need for 24/7 support and increased deficits, OT continuing to recommend therapy < 3 hours. OT will continue to follow.      If plan is discharge home, recommend the following:  A little help with walking and/or transfers;A lot of help with bathing/dressing/bathroom;Assistance with cooking/housework;Direct supervision/assist for medications management;Direct supervision/assist for financial management;Assist for transportation;Help with stairs or ramp for entrance;Supervision due to cognitive status   Equipment Recommendations  Other (comment) (defer to next venue)    Recommendations for Other Services      Precautions / Restrictions Precautions Precautions: Fall Restrictions Weight Bearing Restrictions Per Provider Order: No       Mobility Bed Mobility Overal bed mobility: Needs Assistance Bed Mobility: Supine to Sit, Sit to Supine     Supine to sit: Contact guard, HOB elevated, Used rails Sit to supine: Contact guard assist, HOB elevated, Used rails   General bed mobility comments: able to complete with increased time    Transfers Overall transfer level: Needs assistance Equipment used: Rolling walker (2  wheels) Transfers: Sit to/from Stand Sit to Stand: Contact guard assist, Min assist           General transfer comment: pt reliant on RUE to power up, CGA for safety with rise from EOB x2. Cues for RW management initially, but good carry over for second sit<>stand     Balance Overall balance assessment: Needs assistance, History of Falls Sitting-balance support: Bilateral upper extremity supported, Feet supported Sitting balance-Leahy Scale: Good     Standing balance support: Bilateral upper extremity supported, During functional activity, Reliant on assistive device for balance                               ADL either performed or assessed with clinical judgement   ADL Overall ADL's : Needs assistance/impaired                     Lower Body Dressing: Moderate assistance;Sit to/from stand;Sitting/lateral leans Lower Body Dressing Details (indicate cue type and reason): donning and doffing brief Toilet Transfer: Contact guard assist;Ambulation;Rolling walker (2 wheels) Toilet Transfer Details (indicate cue type and reason): sit<>stands from EOB to doff and don brief         Functional mobility during ADLs: Minimal assistance;Cueing for sequencing;Cueing for safety;Rolling walker (2 wheels) General ADL Comments: Patient progressing towards goals. Patient seen with sister in law present, and patient noteably drowsy (suspect due to medications as patient had received halidol overnight). Patient able to complete bed mobility at CGA, and sit<>stands with CGA to doff and don new brief (Mod A for lower body management). Given patient's need for 24/7 support and increased deficits, OT continuing to recommend therapy < 3 hours. OT  will continue to follow.    Extremity/Trunk Assessment              Vision       Perception     Praxis      Cognition Arousal: Alert Behavior During Therapy: WFL for tasks assessed/performed Overall Cognitive Status:  Impaired/Different from baseline Area of Impairment: Attention, Memory, Following commands, Safety/judgement, Awareness, Problem solving                   Current Attention Level: Selective Memory: Decreased short-term memory Following Commands: Follows one step commands consistently, Follows multi-step commands with increased time Safety/Judgement: Decreased awareness of safety, Decreased awareness of deficits Awareness: Emergent Problem Solving: Slow processing, Decreased initiation, Difficulty sequencing, Requires verbal cues, Requires tactile cues General Comments: Patient drowsy, but had received halidol around midnight last night, could follow commands with increased time, decreased awareness with near urinary incontinence        Exercises      Shoulder Instructions       General Comments      Pertinent Vitals/ Pain       Pain Assessment Pain Assessment: No/denies pain  Home Living                                          Prior Functioning/Environment              Frequency  Min 1X/week        Progress Toward Goals  OT Goals(current goals can now be found in the care plan section)  Progress towards OT goals: Progressing toward goals  Acute Rehab OT Goals Patient Stated Goal: to take a nap OT Goal Formulation: With patient Time For Goal Achievement: 12/09/23 Potential to Achieve Goals: Fair  Plan      Co-evaluation                 AM-PAC OT "6 Clicks" Daily Activity     Outcome Measure   Help from another person eating meals?: A Little Help from another person taking care of personal grooming?: A Little Help from another person toileting, which includes using toliet, bedpan, or urinal?: A Lot Help from another person bathing (including washing, rinsing, drying)?: A Lot Help from another person to put on and taking off regular upper body clothing?: A Little Help from another person to put on and taking off regular  lower body clothing?: A Lot 6 Click Score: 15    End of Session Equipment Utilized During Treatment: Gait belt;Rolling walker (2 wheels)  OT Visit Diagnosis: Unsteadiness on feet (R26.81);Other abnormalities of gait and mobility (R26.89);Muscle weakness (generalized) (M62.81);History of falling (Z91.81)   Activity Tolerance Patient tolerated treatment well   Patient Left in bed;with call bell/phone within reach;with bed alarm set;with family/visitor present   Nurse Communication Mobility status        Time: 1610-9604 OT Time Calculation (min): 22 min  Charges: OT General Charges $OT Visit: 1 Visit OT Treatments $Self Care/Home Management : 8-22 mins  Pollyann Glen E. Clay Menser, OTR/L Acute Rehabilitation Services [phone]   Cherlyn Cushing 12/01/2023, 12:54 PM

## 2023-12-01 NOTE — Progress Notes (Signed)
PROGRESS NOTE  Karl Lawson.  RJJ:[id number] DOB: 14-Sep-1946 DOA: 11/23/2023 PCP: Mliss Sax, MD   Brief Narrative: Patient is a 78 year old male with history of Parkinson disease, diabetes type 2, complete heart block status post pacemaker placement, COPD, hypertension who was brought here from home for evaluation of increased confusion, weakness, dysuria.He lives alone.  On presentation, he was hypotensive ,tachycardiac, tachypneic.  Lab work showed WC count of 22,000, sodium of 124, creatinine of 1.49.  UA suspicious for UTI.  Patient was started on ceftriaxone.  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  Started on Flomax.  Urine culture showed ESBL Klebsiella pneumoniae, started on meropenem.  Due to urinary retention, Foley placed.Marland Kitchen  Hospital course remarkable for agitation now calmer .  PT recommending SNF.TOC following.  Plan for discharge to SNF after completion of 5 days course of meropenem.  He will complete the antibiotic course  today.  Medically stable for discharge.  Assessment & Plan:  Principal Problem:   Sepsis (HCC) Active Problems:   UTI (urinary tract infection)   ARF (acute renal failure) (HCC)   Hyponatremia   Frequent falls   AV block, 3rd degree (HCC)   COPD (chronic obstructive pulmonary disease) (HCC)   Essential hypertension   Parkinson's disease (HCC)   Mixed hyperlipidemia   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, bilateral (HCC)   B12 deficiency  Sepsis secondary to UTI: Presented with leukocytosis, tachycardia, tachypnea.  Currently hemodynamically stable.  Afebrile.  Sepsis physiology has resolved.  Blood cultures did not show any growth.  Urine culture showing Klebsiella pneumonia.  ESBL UTI: Urine culture showed Klebsiella pneumoniae, ESBL.  Currently on meropenem.  Plan for 5 days course,day 5/5  Acute renal failure and CKD  stage IIIb/urine retention: Baseline creatinine fluctuating from 1.2-1.5.  Creatinine worsened so Foley placed.  Renal ultrasound did not show any obstruction.  Creatinine normalized now,foley removed.   Continue Flomax.  Third-degree AV block: Status post pacemaker placement  COPD: Currently not in exacerbation.  Continue bronchodilators as needed.On room air  Hypertension: Blood pressure currently stable.  Home losartan chlorthalidone on hold for now, will resume if blood pressure trends up  Parkinson's disease:On  Sinemet.  Hospital course remarkable for intermittent agitation.  This morning he was calm and comfortable, alert and mostly oriented.  Continue Sinemet. Continue  low-dose Seroquel  Hyperlipidemia: On Lipitor  Type 2 diabetes: On sliding scale, metformin, Jardiance at home.   A1c of 7.2.  Continue current insulin regimen.  Will resume home medications on discharge.  Diabetic coordinator also consulted for persistent hyperglycemia.  Vitamin B-12 deficiency: Low at 55.  Given few doses of  IM supplementation.  Continue oral supplementation daily.  Debility/deconditioning: PT/OT recommending SNF on discharge.  TOC following.        DVT prophylaxis:enoxaparin (LOVENOX) injection 40 mg Start: 11/24/23 1000     Code Status: Limited: Do not attempt resuscitation (DNR) -DNR-LIMITED -Do Not Intubate/DNI   Family Communication: Called and discussed with his son Mr. Clayburn Weekly on phone on 1/22.  Discussed with sister-in-law on 1/24  Patient status:Inpatient  Patient is from :home  Anticipated discharge to:SNF  Estimated DC date: Whenever possible   Consultants: None  Procedures:none  Antimicrobials:  Anti-infectives (From admission, onward)    Start     Dose/Rate Route Frequency Ordered Stop   11/29/23 0200  meropenem (MERREM) 1 g in sodium chloride 0.9 % 100 mL  IVPB        1 g 200 mL/hr over 30 Minutes Intravenous Every 8 hours 11/28/23 2108 12/01/23 1759    11/26/23 1600  meropenem (MERREM) 1 g in sodium chloride 0.9 % 100 mL IVPB  Status:  Discontinued        1 g 200 mL/hr over 30 Minutes Intravenous Every 12 hours 11/26/23 1453 11/28/23 2108   11/24/23 2300  cefTRIAXone (ROCEPHIN) 2 g in sodium chloride 0.9 % 100 mL IVPB  Status:  Discontinued        2 g 200 mL/hr over 30 Minutes Intravenous Every 24 hours 11/24/23 0441 11/26/23 1453   11/23/23 2230  cefTRIAXone (ROCEPHIN) 2 g in sodium chloride 0.9 % 100 mL IVPB        2 g 200 mL/hr over 30 Minutes Intravenous Once 11/23/23 2224 11/24/23 0004       Subjective: Patient seen and examined the bedside today.  During evaluation, he was comfortable.  Sitting at the edge of the bed.  Mostly alert and oriented.  Denies any new complaints.  Objective: Vitals:   11/30/23 0853 11/30/23 1447 12/01/23 0427 12/01/23 0908  BP: (!) 160/74 (!) 152/80 128/66 121/70  Pulse: 87 96 (!) 102 97  Resp: 16 16 18 18   Temp: 97.9 F (36.6 C)  98 F (36.7 C) 97.7 F (36.5 C)  TempSrc:   Oral   SpO2: 98% 100% 94% 98%  Weight:      Height:       No intake or output data in the 24 hours ending 12/01/23 1117  Filed Weights   11/23/23 2100  Weight: 94.3 kg    Examination:  General exam: Overall comfortable, not in distress,obese, deconditioned HEENT: PERRL Respiratory system:  no wheezes or crackles  Cardiovascular system: S1 & S2 heard, RRR.  Gastrointestinal system: Abdomen is nondistended, soft and nontender. Central nervous system: Alert and oriented Extremities: No edema, no clubbing ,no cyanosis Skin: No rashes, no ulcers,no icterus     Data Reviewed: I have personally reviewed following labs and imaging studies  CBC: Recent Labs  Lab 11/25/23 0849 11/26/23 0653 11/27/23 0914  WBC 16.1* 11.6* 9.8  HGB 12.5* 11.6* 11.5*  HCT 35.6* 34.1* 33.5*  MCV 76.7* 77.9* 78.6*  PLT 251 212 226   Basic Metabolic Panel: Recent Labs  Lab 11/25/23 0849 11/26/23 0653 11/27/23 0914  11/28/23 0843 12/01/23 0700  NA 124* 127* 131* 135 140  K 3.6 3.4* 3.4* 3.6 3.8  CL 90* 95* 99 101 100  CO2 20* 20* 21* 24 27  GLUCOSE 229* 209* 211* 208* 204*  BUN 29* 37* 34* 27* 29*  CREATININE 1.86* 2.24* 1.55* 1.17 1.51*  CALCIUM 9.4 8.9 9.1 9.4 9.8     Recent Results (from the past 240 hours)  Urine Culture     Status: Abnormal   Collection Time: 11/23/23 10:45 PM   Specimen: Urine, Random  Result Value Ref Range Status   Specimen Description URINE, RANDOM  Final   Special Requests   Final    NONE Reflexed from [id number] Performed at [facility], [address], [place], [place] [address]    Culture (A)  Final    >=100,000 COLONIES/mL KLEBSIELLA PNEUMONIAE Confirmed Extended Spectrum Beta-Lactamase Producer (ESBL).  In bloodstream infections from ESBL organisms, carbapenems are preferred over piperacillin/tazobactam. They are shown to have a lower risk of mortality.    Report Status 11/26/2023 FINAL  Final   Organism ID, Bacteria KLEBSIELLA PNEUMONIAE (  A)  Final      Susceptibility   Klebsiella pneumoniae - MIC*    AMPICILLIN >=32 RESISTANT Resistant     CEFAZOLIN >=64 RESISTANT Resistant     CEFEPIME >=32 RESISTANT Resistant     CEFTRIAXONE >=64 RESISTANT Resistant     CIPROFLOXACIN 2 RESISTANT Resistant     GENTAMICIN >=16 RESISTANT Resistant     IMIPENEM 1 SENSITIVE Sensitive     NITROFURANTOIN 128 RESISTANT Resistant     TRIMETH/SULFA >=320 RESISTANT Resistant     AMPICILLIN/SULBACTAM >=32 RESISTANT Resistant     PIP/TAZO 32 INTERMEDIATE Intermediate ug/mL    * >=100,000 COLONIES/mL KLEBSIELLA PNEUMONIAE  Resp panel by RT-PCR (RSV, Flu A&B, Covid) Anterior Nasal Swab     Status: None   Collection Time: 11/23/23 10:48 PM   Specimen: Anterior Nasal Swab  Result Value Ref Range Status   SARS Coronavirus 2 by RT PCR NEGATIVE NEGATIVE Final   Influenza A by PCR NEGATIVE NEGATIVE Final   Influenza B by PCR NEGATIVE NEGATIVE Final    Comment: (NOTE) The  Xpert Xpress SARS-CoV-2/FLU/RSV plus assay is intended as an aid in the diagnosis of influenza from Nasopharyngeal swab specimens and should not be used as a sole basis for treatment. Nasal washings and aspirates are unacceptable for Xpert Xpress SARS-CoV-2/FLU/RSV testing.  Fact Sheet for Patients: BloggerCourse.com  Fact Sheet for Healthcare Providers: SeriousBroker.it  This test is not yet approved or cleared by the [place] FDA and has been authorized for detection and/or diagnosis of SARS-CoV-2 by FDA under an Emergency Use Authorization (EUA). This EUA will remain in effect (meaning this test can be used) for the duration of the COVID-19 declaration under Section 564(b)(1) of the Act, 21 U.S.C. section 360bbb-3(b)(1), unless the authorization is terminated or revoked.     Resp Syncytial Virus by PCR NEGATIVE NEGATIVE Final    Comment: (NOTE) Fact Sheet for Patients: BloggerCourse.com  Fact Sheet for Healthcare Providers: SeriousBroker.it  This test is not yet approved or cleared by the [place] FDA and has been authorized for detection and/or diagnosis of SARS-CoV-2 by FDA under an Emergency Use Authorization (EUA). This EUA will remain in effect (meaning this test can be used) for the duration of the COVID-19 declaration under Section 564(b)(1) of the Act, 21 U.S.C. section 360bbb-3(b)(1), unless the authorization is terminated or revoked.  Performed at [facility], [address], [place], [place] [address]   Blood Culture (routine x 2)     Status: None   Collection Time: 11/23/23 11:00 PM   Specimen: BLOOD  Result Value Ref Range Status   Specimen Description BLOOD LEFT ANTECUBITAL  Final   Special Requests   Final    BOTTLES DRAWN AEROBIC AND ANAEROBIC Blood Culture results may not be optimal due to an inadequate volume of blood received in culture  bottles   Culture   Final    NO GROWTH 5 DAYS Performed at [facility], [address], [place], [place] [address]    Report Status 11/28/2023 FINAL  Final  Blood Culture (routine x 2)     Status: None   Collection Time: 11/23/23 11:15 PM   Specimen: BLOOD  Result Value Ref Range Status   Specimen Description BLOOD RIGHT ANTECUBITAL  Final   Special Requests   Final    BOTTLES DRAWN AEROBIC AND ANAEROBIC Blood Culture results may not be optimal due to an inadequate volume of blood received in culture bottles   Culture  Final    NO GROWTH 5 DAYS Performed at [facility], [address] [place], [place] [address]    Report Status 11/28/2023 FINAL  Final     Radiology Studies: No results found.   Scheduled Meds:  aspirin EC  81 mg Oral Daily   atorvastatin  40 mg Oral Daily   carbidopa-levodopa  1.5 tablet Oral TID   Chlorhexidine Gluconate Cloth  6 each Topical Daily   vitamin B-12  1,000 mcg Oral Daily   enoxaparin (LOVENOX) injection  40 mg Subcutaneous Q24H   feeding supplement  237 mL Oral BID BM   finasteride  5 mg Oral Daily   insulin aspart  0-9 Units Subcutaneous TID WC   insulin glargine-yfgn  15 Units Subcutaneous Daily   pantoprazole  40 mg Oral Daily   QUEtiapine  25 mg Oral QHS   tamsulosin  0.4 mg Oral QPC supper   Continuous Infusions:  meropenem (MERREM) IV 1 g (12/01/23 0945)     LOS: 7 days   Burnadette Pop, MD [facility] P1/24/2025, 11:17 AM

## 2023-12-01 NOTE — Progress Notes (Signed)
[facility] night cross cover note:  I was notified by RN that this patient is agitated, pulling at their telemetry leads, showing some aggression towards the staff, with these behaviors refractory to attempts at verbal redirection.  In the setting of associated interference with ongoing medical treatment posing potential harm to themself, I have placed order for prn iv haldol.    Newton Pigg, DO Hospitalist

## 2023-12-01 NOTE — Inpatient Diabetes Management (Signed)
Inpatient Diabetes Program Recommendations  AACE/ADA: New Consensus Statement on Inpatient Glycemic Control (2015)  Target Ranges:  Prepandial:   less than 140 mg/dL      Peak postprandial:   less than 180 mg/dL (1-2 hours)      Critically ill patients:  140 - 180 mg/dL   Lab Results  Component Value Date   GLUCAP 267 (H) 12/01/2023   HGBA1C 7.2 (H) 11/30/2023    Review of Glycemic Control  Latest Reference Range & Units 11/30/23 08:57 11/30/23 13:14 11/30/23 18:15 11/30/23 22:25 12/01/23 09:10  Glucose-Capillary 70 - 99 mg/dL 604 (H) 540 (H) 981 (H) 168 (H) 267 (H)  (H): Data is abnormally high  Diabetes history: DM2 Outpatient Diabetes medications: Novolog 0-9 units TID, Jardiance 10 mg every day, Metformin 1000 mg BID Current orders for Inpatient glycemic control: Semglee 15 units every day, Novolog 0-9 units TID  Inpatient Diabetes Program Recommendations:    Please consider increasing basal insulin:  Semglee 20 units every day  Will continue to follow while inpatient.  Thank you, Dulce Sellar, MSN, CDCES Diabetes Coordinator Inpatient Diabetes Program [phone] (team pager from 8a-5p)

## 2023-12-01 NOTE — Progress Notes (Signed)
Upon initial assessment patient Aox3, disoriented to situation. Explained plan of care for the day. Patient verbalized agreement. He has been calm, cooperative, and pleasant. He can be forgetful but is easily re-directed.

## 2023-12-02 DIAGNOSIS — R6520 Severe sepsis without septic shock: Secondary | ICD-10-CM | POA: Diagnosis not present

## 2023-12-02 DIAGNOSIS — A419 Sepsis, unspecified organism: Secondary | ICD-10-CM | POA: Diagnosis not present

## 2023-12-02 LAB — GLUCOSE, CAPILLARY
Glucose-Capillary: 251 mg/dL — ABNORMAL HIGH (ref 70–99)
Glucose-Capillary: 325 mg/dL — ABNORMAL HIGH (ref 70–99)
Glucose-Capillary: 188 mg/dL — ABNORMAL HIGH (ref 70–99)
Glucose-Capillary: 199 mg/dL — ABNORMAL HIGH (ref 70–99)

## 2023-12-02 NOTE — Progress Notes (Signed)
PROGRESS NOTE  Karl Lawson.  ZOX:[id number] DOB: 09-08-1946 DOA: 11/23/2023 PCP: Mliss Sax, MD   Brief Narrative: Patient is a 78 year old male with history of Parkinson disease, diabetes type 2, complete heart block status post pacemaker placement, COPD, hypertension who was brought here from home for evaluation of increased confusion, weakness, dysuria.He lives alone.  On presentation, he was hypotensive ,tachycardiac, tachypneic.  Lab work showed WC count of 22,000, sodium of 124, creatinine of 1.49.  UA suspicious for UTI.  Patient was started on ceftriaxone.  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  Started on Flomax.  Urine culture showed ESBL Klebsiella pneumoniae, started on meropenem.  Due to urinary retention, Foley placed.Marland Kitchen  Hospital course remarkable for agitation now calmer .  PT recommending SNF.TOC following.  Plan for discharge to SNF after completion of 5 days course of meropenem.  He will complete the antibiotic course  today.  Medically stable for discharge.  Assessment & Plan:  Principal Problem:   Sepsis (HCC) Active Problems:   UTI (urinary tract infection)   ARF (acute renal failure) (HCC)   Hyponatremia   Frequent falls   AV block, 3rd degree (HCC)   COPD (chronic obstructive pulmonary disease) (HCC)   Essential hypertension   Parkinson's disease (HCC)   Mixed hyperlipidemia   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, bilateral (HCC)   B12 deficiency  Sepsis secondary to UTI: Presented with leukocytosis, tachycardia, tachypnea.  Currently hemodynamically stable.  Afebrile.  Sepsis physiology has resolved.  Blood cultures did not show any growth.  Urine culture showing Klebsiella pneumonia.  ESBL UTI: Urine culture showed Klebsiella pneumoniae, ESBL.  Currently on meropenem.  Plan for 5 days course,day 5/5  Acute renal failure and CKD  stage IIIb/urine retention: Baseline creatinine fluctuating from 1.2-1.5.  Creatinine worsened so Foley placed.  Renal ultrasound did not show any obstruction.  Creatinine normalized now,foley removed.   Continue Flomax.  Third-degree AV block: Status post pacemaker placement  COPD: Currently not in exacerbation.  Continue bronchodilators as needed.On room air  Hypertension: Blood pressure currently stable.  Home losartan chlorthalidone on hold for now, will resume if blood pressure trends up  Parkinson's disease:On  Sinemet.  Hospital course remarkable for intermittent agitation.  This morning he was calm and comfortable, alert and mostly oriented.  Continue Sinemet. Continue  low-dose Seroquel  Hyperlipidemia: On Lipitor  Type 2 diabetes: On sliding scale, metformin, Jardiance at home.   A1c of 7.2.  Continue current insulin regimen.  Will resume home medications on discharge.  Diabetic coordinator also consulted for persistent hyperglycemia.  Vitamin B-12 deficiency: Low at 55.  Given few doses of  IM supplementation.  Continue oral supplementation daily.  Debility/deconditioning: PT/OT recommending SNF on discharge.  TOC following.        DVT prophylaxis:enoxaparin (LOVENOX) injection 40 mg Start: 11/24/23 1000     Code Status: Limited: Do not attempt resuscitation (DNR) -DNR-LIMITED -Do Not Intubate/DNI   Family Communication: Called and discussed with his son Mr. Douglass Dunshee on phone on 1/22.  Discussed with sister-in-law on 1/24  Patient status:Inpatient  Patient is from :home  Anticipated discharge to:SNF  Estimated DC date: Whenever possible.  Facility is taking him on Monday   Consultants: None  Procedures:none  Antimicrobials:  Anti-infectives (From admission, onward)    Start     Dose/Rate Route Frequency Ordered Stop   11/29/23 0200  meropenem (MERREM) 1 g  in sodium chloride 0.9 % 100 mL IVPB        1 g 200 mL/hr over 30 Minutes Intravenous Every 8 hours  11/28/23 2108 12/01/23 1759   11/26/23 1600  meropenem (MERREM) 1 g in sodium chloride 0.9 % 100 mL IVPB  Status:  Discontinued        1 g 200 mL/hr over 30 Minutes Intravenous Every 12 hours 11/26/23 1453 11/28/23 2108   11/24/23 2300  cefTRIAXone (ROCEPHIN) 2 g in sodium chloride 0.9 % 100 mL IVPB  Status:  Discontinued        2 g 200 mL/hr over 30 Minutes Intravenous Every 24 hours 11/24/23 0441 11/26/23 1453   11/23/23 2230  cefTRIAXone (ROCEPHIN) 2 g in sodium chloride 0.9 % 100 mL IVPB        2 g 200 mL/hr over 30 Minutes Intravenous Once 11/23/23 2224 11/24/23 0004       Subjective: Patient seen and examined at bedside today.  He was lying on the bed.  Appears comfortable.  Alert and oriented this morning.  He said he did not have a good sleep last night.  Objective: Vitals:   12/01/23 1538 12/01/23 2113 12/02/23 0500 12/02/23 0758  BP: (!) 140/83 139/65 120/68 (!) 152/77  Pulse: 87 96 (!) 103 91  Resp: 18 17 16 18   Temp: 98.5 F (36.9 C) 97.8 F (36.6 C) 98 F (36.7 C)   TempSrc:      SpO2: 98% 97% 96% 96%  Weight:      Height:       No intake or output data in the 24 hours ending 12/02/23 1051  Filed Weights   11/23/23 2100  Weight: 94.3 kg    Examination:   General exam: Overall comfortable, not in distress,obese, weak and deconditioned HEENT: PERRL Respiratory system:  no wheezes or crackles  Cardiovascular system: S1 & S2 heard, RRR.  Gastrointestinal system: Abdomen is nondistended, soft and nontender. Central nervous system: Alert and oriented Extremities: No edema, no clubbing ,no cyanosis Skin: No rashes, no ulcers,no icterus     Data Reviewed: I have personally reviewed following labs and imaging studies  CBC: Recent Labs  Lab 11/26/23 0653 11/27/23 0914  WBC 11.6* 9.8  HGB 11.6* 11.5*  HCT 34.1* 33.5*  MCV 77.9* 78.6*  PLT 212 226   Basic Metabolic Panel: Recent Labs  Lab 11/26/23 0653 11/27/23 0914 11/28/23 0843 12/01/23 0700   NA 127* 131* 135 140  K 3.4* 3.4* 3.6 3.8  CL 95* 99 101 100  CO2 20* 21* 24 27  GLUCOSE 209* 211* 208* 204*  BUN 37* 34* 27* 29*  CREATININE 2.24* 1.55* 1.17 1.51*  CALCIUM 8.9 9.1 9.4 9.8     Recent Results (from the past 240 hours)  Urine Culture     Status: Abnormal   Collection Time: 11/23/23 10:45 PM   Specimen: Urine, Random  Result Value Ref Range Status   Specimen Description URINE, RANDOM  Final   Special Requests   Final    NONE Reflexed from [id number] Performed at [facility], [address], [place], [place] [address]    Culture (A)  Final    >=100,000 COLONIES/mL KLEBSIELLA PNEUMONIAE Confirmed Extended Spectrum Beta-Lactamase Producer (ESBL).  In bloodstream infections from ESBL organisms, carbapenems are preferred over piperacillin/tazobactam. They are shown to have a lower risk of mortality.    Report Status 11/26/2023 FINAL  Final   Organism ID, Bacteria KLEBSIELLA PNEUMONIAE (A)  Final  Susceptibility   Klebsiella pneumoniae - MIC*    AMPICILLIN >=32 RESISTANT Resistant     CEFAZOLIN >=64 RESISTANT Resistant     CEFEPIME >=32 RESISTANT Resistant     CEFTRIAXONE >=64 RESISTANT Resistant     CIPROFLOXACIN 2 RESISTANT Resistant     GENTAMICIN >=16 RESISTANT Resistant     IMIPENEM 1 SENSITIVE Sensitive     NITROFURANTOIN 128 RESISTANT Resistant     TRIMETH/SULFA >=320 RESISTANT Resistant     AMPICILLIN/SULBACTAM >=32 RESISTANT Resistant     PIP/TAZO 32 INTERMEDIATE Intermediate ug/mL    * >=100,000 COLONIES/mL KLEBSIELLA PNEUMONIAE  Resp panel by RT-PCR (RSV, Flu A&B, Covid) Anterior Nasal Swab     Status: None   Collection Time: 11/23/23 10:48 PM   Specimen: Anterior Nasal Swab  Result Value Ref Range Status   SARS Coronavirus 2 by RT PCR NEGATIVE NEGATIVE Final   Influenza A by PCR NEGATIVE NEGATIVE Final   Influenza B by PCR NEGATIVE NEGATIVE Final    Comment: (NOTE) The Xpert Xpress SARS-CoV-2/FLU/RSV plus assay is intended as an aid in  the diagnosis of influenza from Nasopharyngeal swab specimens and should not be used as a sole basis for treatment. Nasal washings and aspirates are unacceptable for Xpert Xpress SARS-CoV-2/FLU/RSV testing.  Fact Sheet for Patients: BloggerCourse.com  Fact Sheet for Healthcare Providers: SeriousBroker.it  This test is not yet approved or cleared by the [place] FDA and has been authorized for detection and/or diagnosis of SARS-CoV-2 by FDA under an Emergency Use Authorization (EUA). This EUA will remain in effect (meaning this test can be used) for the duration of the COVID-19 declaration under Section 564(b)(1) of the Act, 21 U.S.C. section 360bbb-3(b)(1), unless the authorization is terminated or revoked.     Resp Syncytial Virus by PCR NEGATIVE NEGATIVE Final    Comment: (NOTE) Fact Sheet for Patients: BloggerCourse.com  Fact Sheet for Healthcare Providers: SeriousBroker.it  This test is not yet approved or cleared by the [place] FDA and has been authorized for detection and/or diagnosis of SARS-CoV-2 by FDA under an Emergency Use Authorization (EUA). This EUA will remain in effect (meaning this test can be used) for the duration of the COVID-19 declaration under Section 564(b)(1) of the Act, 21 U.S.C. section 360bbb-3(b)(1), unless the authorization is terminated or revoked.  Performed at [facility], [address] [place], [place] [address]   Blood Culture (routine x 2)     Status: None   Collection Time: 11/23/23 11:00 PM   Specimen: BLOOD  Result Value Ref Range Status   Specimen Description BLOOD LEFT ANTECUBITAL  Final   Special Requests   Final    BOTTLES DRAWN AEROBIC AND ANAEROBIC Blood Culture results may not be optimal due to an inadequate volume of blood received in culture bottles   Culture   Final    NO GROWTH 5 DAYS Performed at [facility], [address], [place], [place] [address]    Report Status 11/28/2023 FINAL  Final  Blood Culture (routine x 2)     Status: None   Collection Time: 11/23/23 11:15 PM   Specimen: BLOOD  Result Value Ref Range Status   Specimen Description BLOOD RIGHT ANTECUBITAL  Final   Special Requests   Final    BOTTLES DRAWN AEROBIC AND ANAEROBIC Blood Culture results may not be optimal due to an inadequate volume of blood received in culture bottles   Culture   Final    NO GROWTH 5  DAYS Performed at [facility], [address] [place], [place] [address]    Report Status 11/28/2023 FINAL  Final     Radiology Studies: No results found.   Scheduled Meds:  aspirin EC  81 mg Oral Daily   atorvastatin  40 mg Oral Daily   carbidopa-levodopa  1.5 tablet Oral TID   Chlorhexidine Gluconate Cloth  6 each Topical Daily   vitamin B-12  1,000 mcg Oral Daily   enoxaparin (LOVENOX) injection  40 mg Subcutaneous Q24H   feeding supplement  237 mL Oral BID BM   finasteride  5 mg Oral Daily   insulin aspart  0-9 Units Subcutaneous TID WC   insulin glargine-yfgn  20 Units Subcutaneous Daily   pantoprazole  40 mg Oral Daily   QUEtiapine  25 mg Oral QHS   tamsulosin  0.4 mg Oral QPC supper   Continuous Infusions:     LOS: 8 days   Burnadette Pop, MD [facility] P1/25/2025, 10:51 AM

## 2023-12-03 DIAGNOSIS — A419 Sepsis, unspecified organism: Secondary | ICD-10-CM | POA: Diagnosis not present

## 2023-12-03 DIAGNOSIS — R6520 Severe sepsis without septic shock: Secondary | ICD-10-CM | POA: Diagnosis not present

## 2023-12-03 LAB — GLUCOSE, CAPILLARY
Glucose-Capillary: 214 mg/dL — ABNORMAL HIGH (ref 70–99)
Glucose-Capillary: 242 mg/dL — ABNORMAL HIGH (ref 70–99)
Glucose-Capillary: 203 mg/dL — ABNORMAL HIGH (ref 70–99)
Glucose-Capillary: 160 mg/dL — ABNORMAL HIGH (ref 70–99)

## 2023-12-03 LAB — CBC
HCT: 36.3 % — ABNORMAL LOW (ref 39.0–52.0)
Hemoglobin: 12.2 g/dL — ABNORMAL LOW (ref 13.0–17.0)
MCH: 26.5 pg (ref 26.0–34.0)
MCHC: 33.6 g/dL (ref 30.0–36.0)
MCV: 78.9 fL — ABNORMAL LOW (ref 80.0–100.0)
Platelets: 299 10*3/uL (ref 150–400)
RBC: 4.6 MIL/uL (ref 4.22–5.81)
RDW: 16.2 % — ABNORMAL HIGH (ref 11.5–15.5)
WBC: 8.1 10*3/uL (ref 4.0–10.5)
nRBC: 0 % (ref 0.0–0.2)

## 2023-12-03 LAB — BASIC METABOLIC PANEL
Anion gap: 10 (ref 5–15)
BUN: 36 mg/dL — ABNORMAL HIGH (ref 8–23)
CO2: 27 mmol/L (ref 22–32)
Calcium: 9.6 mg/dL (ref 8.9–10.3)
Chloride: 100 mmol/L (ref 98–111)
Creatinine, Ser: 1.73 mg/dL — ABNORMAL HIGH (ref 0.61–1.24)
GFR, Estimated: 40 mL/min — ABNORMAL LOW (ref >60)
Glucose, Bld: 229 mg/dL — ABNORMAL HIGH (ref 70–99)
Potassium: 4.2 mmol/L (ref 3.5–5.1)
Sodium: 137 mmol/L (ref 135–145)

## 2023-12-03 NOTE — Progress Notes (Signed)
PROGRESS NOTE  Karl Lawson.  ZOX:[id number] DOB: 1946-09-25 DOA: 11/23/2023 PCP: Mliss Sax, MD   Brief Narrative: Patient is a 78 year old male with history of Parkinson disease, diabetes type 2, complete heart block status post pacemaker placement, COPD, hypertension who was brought here from home for evaluation of increased confusion, weakness, dysuria.He lives alone.  On presentation, he was hypotensive ,tachycardiac, tachypneic.  Lab work showed WC count of 22,000, sodium of 124, creatinine of 1.49.  UA suspicious for UTI.  Patient was started on ceftriaxone.  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  Started on Flomax.  Urine culture showed ESBL Klebsiella pneumoniae, started on meropenem.  Due to urinary retention, Foley placed.Marland Kitchen  Hospital course remarkable for agitation now calmer .  PT recommending SNF.TOC following.  S/P  5 days course of meropenem.   Medically stable for discharge.  Assessment & Plan:  Principal Problem:   Sepsis (HCC) Active Problems:   UTI (urinary tract infection)   ARF (acute renal failure) (HCC)   Hyponatremia   Frequent falls   AV block, 3rd degree (HCC)   COPD (chronic obstructive pulmonary disease) (HCC)   Essential hypertension   Parkinson's disease (HCC)   Mixed hyperlipidemia   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, bilateral (HCC)   B12 deficiency  Sepsis secondary to UTI: Presented with leukocytosis, tachycardia, tachypnea.  Currently hemodynamically stable.  Afebrile.  Sepsis physiology has resolved.  Blood cultures did not show any growth.  Urine culture showed Klebsiella pneumonia.  ESBL UTI: Urine culture showed Klebsiella pneumoniae, ESBL.  Started on meropenem.  Completed 5 days course of antibiotics  Acute renal failure and CKD stage IIIb/urine retention: Baseline creatinine fluctuating from 1.2-1.5.   Creatinine worsened so Foley placed.  Renal ultrasound did not show any obstruction.  Creatinine normalized now,foley removed.   Continue Flomax.  Third-degree AV block: Status post pacemaker placement  COPD: Currently not in exacerbation.  Continue bronchodilators as needed.On room air  Hypertension: Blood pressure currently stable.  Home losartan chlorthalidone on hold for now, will resume if blood pressure trends up  Parkinson's disease:On  Sinemet.  Hospital course remarkable for intermittent agitation.  This morning he was calm and comfortable, alert and mostly oriented.  Continue Sinemet. Continue  low-dose Seroquel  Hyperlipidemia: On Lipitor  Type 2 diabetes: On sliding scale, metformin, Jardiance at home.   A1c of 7.2.  Continue current insulin regimen.  Will resume home medications on discharge.  Diabetic coordinator also consulted for persistent hyperglycemia.  Vitamin B-12 deficiency: Low at 55.  Given few doses of  IM supplementation.  Continue oral supplementation daily.  Debility/deconditioning: PT/OT recommending SNF on discharge.  TOC following.        DVT prophylaxis:enoxaparin (LOVENOX) injection 40 mg Start: 11/24/23 1000     Code Status: Limited: Do not attempt resuscitation (DNR) -DNR-LIMITED -Do Not Intubate/DNI   Family Communication: Called and discussed with his son Mr. Hartwell Vandiver on phone on 1/22.  Discussed with sister-in-law on 1/24  Patient status:Inpatient  Patient is from :home  Anticipated discharge to:SNF  Estimated DC date: Whenever possible.  Facility is taking him on Monday   Consultants: None  Procedures:none  Antimicrobials:  Anti-infectives (From admission, onward)    Start     Dose/Rate Route Frequency Ordered Stop   11/29/23 0200  meropenem (MERREM) 1 g in sodium chloride 0.9 % 100 mL IVPB  1 g 200 mL/hr over 30 Minutes Intravenous Every 8 hours 11/28/23 2108 12/01/23 1759   11/26/23 1600  meropenem (MERREM) 1 g in  sodium chloride 0.9 % 100 mL IVPB  Status:  Discontinued        1 g 200 mL/hr over 30 Minutes Intravenous Every 12 hours 11/26/23 1453 11/28/23 2108   11/24/23 2300  cefTRIAXone (ROCEPHIN) 2 g in sodium chloride 0.9 % 100 mL IVPB  Status:  Discontinued        2 g 200 mL/hr over 30 Minutes Intravenous Every 24 hours 11/24/23 0441 11/26/23 1453   11/23/23 2230  cefTRIAXone (ROCEPHIN) 2 g in sodium chloride 0.9 % 100 mL IVPB        2 g 200 mL/hr over 30 Minutes Intravenous Once 11/23/23 2224 11/24/23 0004       Subjective: Patient seen and examined at bedside today.  Hemodynamically stable.  Lying in bed.  No new changes from yesterday.  Comfortable.  Denies new complaints  Objective: Vitals:   12/02/23 0758 12/02/23 1648 12/03/23 0330 12/03/23 0807  BP: (!) 152/77 (!) 143/76 130/69 121/61  Pulse: 91 99 (!) 103 93  Resp: 18 18 18    Temp:  98.1 F (36.7 C) 98 F (36.7 C) 98 F (36.7 C)  TempSrc:  Oral    SpO2: 96% 100% 97% 93%  Weight:      Height:        Intake/Output Summary (Last 24 hours) at 12/03/2023 0958 Last data filed at 12/02/2023 1800 Gross per 24 hour  Intake 480 ml  Output 7 ml  Net 473 ml    Filed Weights   11/23/23 2100  Weight: 94.3 kg    Examination:    General exam: Overall comfortable, not in distress, lying in bed, chronically deconditioned HEENT: PERRL Respiratory system:  no wheezes or crackles  Cardiovascular system: S1 & S2 heard, RRR.  Gastrointestinal system: Abdomen is nondistended, soft and nontender. Central nervous system: Alert and oriented Extremities: No edema, no clubbing ,no cyanosis Skin: No rashes, no ulcers,no icterus     Data Reviewed: I have personally reviewed following labs and imaging studies  CBC: Recent Labs  Lab 11/27/23 0914 12/03/23 0730  WBC 9.8 8.1  HGB 11.5* 12.2*  HCT 33.5* 36.3*  MCV 78.6* 78.9*  PLT 226 299   Basic Metabolic Panel: Recent Labs  Lab 11/27/23 0914 11/28/23 0843 12/01/23 0700  12/03/23 0730  NA 131* 135 140 137  K 3.4* 3.6 3.8 4.2  CL 99 101 100 100  CO2 21* 24 27 27   GLUCOSE 211* 208* 204* 229*  BUN 34* 27* 29* 36*  CREATININE 1.55* 1.17 1.51* 1.73*  CALCIUM 9.1 9.4 9.8 9.6     Recent Results (from the past 240 hours)  Urine Culture     Status: Abnormal   Collection Time: 11/23/23 10:45 PM   Specimen: Urine, Random  Result Value Ref Range Status   Specimen Description URINE, RANDOM  Final   Special Requests   Final    NONE Reflexed from [id number] Performed at [facility], [address], [place], [place] [address]    Culture (A)  Final    >=100,000 COLONIES/mL KLEBSIELLA PNEUMONIAE Confirmed Extended Spectrum Beta-Lactamase Producer (ESBL).  In bloodstream infections from ESBL organisms, carbapenems are preferred over piperacillin/tazobactam. They are shown to have a lower risk of mortality.    Report Status 11/26/2023 FINAL  Final   Organism ID, Bacteria KLEBSIELLA PNEUMONIAE (A)  Final  Susceptibility   Klebsiella pneumoniae - MIC*    AMPICILLIN >=32 RESISTANT Resistant     CEFAZOLIN >=64 RESISTANT Resistant     CEFEPIME >=32 RESISTANT Resistant     CEFTRIAXONE >=64 RESISTANT Resistant     CIPROFLOXACIN 2 RESISTANT Resistant     GENTAMICIN >=16 RESISTANT Resistant     IMIPENEM 1 SENSITIVE Sensitive     NITROFURANTOIN 128 RESISTANT Resistant     TRIMETH/SULFA >=320 RESISTANT Resistant     AMPICILLIN/SULBACTAM >=32 RESISTANT Resistant     PIP/TAZO 32 INTERMEDIATE Intermediate ug/mL    * >=100,000 COLONIES/mL KLEBSIELLA PNEUMONIAE  Resp panel by RT-PCR (RSV, Flu A&B, Covid) Anterior Nasal Swab     Status: None   Collection Time: 11/23/23 10:48 PM   Specimen: Anterior Nasal Swab  Result Value Ref Range Status   SARS Coronavirus 2 by RT PCR NEGATIVE NEGATIVE Final   Influenza A by PCR NEGATIVE NEGATIVE Final   Influenza B by PCR NEGATIVE NEGATIVE Final    Comment: (NOTE) The Xpert Xpress SARS-CoV-2/FLU/RSV plus assay is intended as  an aid in the diagnosis of influenza from Nasopharyngeal swab specimens and should not be used as a sole basis for treatment. Nasal washings and aspirates are unacceptable for Xpert Xpress SARS-CoV-2/FLU/RSV testing.  Fact Sheet for Patients: BloggerCourse.com  Fact Sheet for Healthcare Providers: SeriousBroker.it  This test is not yet approved or cleared by the [place] FDA and has been authorized for detection and/or diagnosis of SARS-CoV-2 by FDA under an Emergency Use Authorization (EUA). This EUA will remain in effect (meaning this test can be used) for the duration of the COVID-19 declaration under Section 564(b)(1) of the Act, 21 U.S.C. section 360bbb-3(b)(1), unless the authorization is terminated or revoked.     Resp Syncytial Virus by PCR NEGATIVE NEGATIVE Final    Comment: (NOTE) Fact Sheet for Patients: BloggerCourse.com  Fact Sheet for Healthcare Providers: SeriousBroker.it  This test is not yet approved or cleared by the [place] FDA and has been authorized for detection and/or diagnosis of SARS-CoV-2 by FDA under an Emergency Use Authorization (EUA). This EUA will remain in effect (meaning this test can be used) for the duration of the COVID-19 declaration under Section 564(b)(1) of the Act, 21 U.S.C. section 360bbb-3(b)(1), unless the authorization is terminated or revoked.  Performed at [facility], [address], [place], [place] [address]   Blood Culture (routine x 2)     Status: None   Collection Time: 11/23/23 11:00 PM   Specimen: BLOOD  Result Value Ref Range Status   Specimen Description BLOOD LEFT ANTECUBITAL  Final   Special Requests   Final    BOTTLES DRAWN AEROBIC AND ANAEROBIC Blood Culture results may not be optimal due to an inadequate volume of blood received in culture bottles   Culture   Final    NO GROWTH 5  DAYS Performed at [facility], [address], [place], [place] [address]    Report Status 11/28/2023 FINAL  Final  Blood Culture (routine x 2)     Status: None   Collection Time: 11/23/23 11:15 PM   Specimen: BLOOD  Result Value Ref Range Status   Specimen Description BLOOD RIGHT ANTECUBITAL  Final   Special Requests   Final    BOTTLES DRAWN AEROBIC AND ANAEROBIC Blood Culture results may not be optimal due to an inadequate volume of blood received in culture bottles   Culture   Final    NO GROWTH 5  DAYS Performed at [facility], [address] [place], [place] [address]    Report Status 11/28/2023 FINAL  Final     Radiology Studies: No results found.   Scheduled Meds:  aspirin EC  81 mg Oral Daily   atorvastatin  40 mg Oral Daily   carbidopa-levodopa  1.5 tablet Oral TID   Chlorhexidine Gluconate Cloth  6 each Topical Daily   vitamin B-12  1,000 mcg Oral Daily   enoxaparin (LOVENOX) injection  40 mg Subcutaneous Q24H   feeding supplement  237 mL Oral BID BM   finasteride  5 mg Oral Daily   insulin aspart  0-9 Units Subcutaneous TID WC   insulin glargine-yfgn  20 Units Subcutaneous Daily   pantoprazole  40 mg Oral Daily   QUEtiapine  25 mg Oral QHS   tamsulosin  0.4 mg Oral QPC supper   Continuous Infusions:     LOS: 9 days   Burnadette Pop, MD [facility] P1/26/2025, 9:58 AM

## 2023-12-03 NOTE — Plan of Care (Signed)
Pt alet x2, tele-NSR, ac/hs, contact-ESBL, pLan-SNF

## 2023-12-04 ENCOUNTER — Ambulatory Visit: Payer: Medicare Other | Attending: Internal Medicine | Admitting: Internal Medicine

## 2023-12-04 DIAGNOSIS — A419 Sepsis, unspecified organism: Secondary | ICD-10-CM | POA: Diagnosis not present

## 2023-12-04 DIAGNOSIS — R6520 Severe sepsis without septic shock: Secondary | ICD-10-CM | POA: Diagnosis not present

## 2023-12-04 LAB — GLUCOSE, CAPILLARY
Glucose-Capillary: 202 mg/dL — ABNORMAL HIGH (ref 70–99)
Glucose-Capillary: 209 mg/dL — ABNORMAL HIGH (ref 70–99)
Glucose-Capillary: 215 mg/dL — ABNORMAL HIGH (ref 70–99)

## 2023-12-04 MED ORDER — QUETIAPINE FUMARATE 25 MG PO TABS: 25.0000 mg | ORAL_TABLET | Freq: Every day | ORAL | Status: DC

## 2023-12-04 MED ORDER — TAMSULOSIN HCL 0.4 MG PO CAPS: 0.4000 mg | ORAL_CAPSULE | Freq: Every day | ORAL | Status: AC

## 2023-12-04 MED ORDER — INSULIN GLARGINE-YFGN 100 UNIT/ML ~~LOC~~ SOLN: 20.0000 [IU] | Freq: Every day | SUBCUTANEOUS | Status: AC

## 2023-12-04 MED ORDER — CYANOCOBALAMIN 1000 MCG PO TABS: 1000.0000 ug | ORAL_TABLET | Freq: Every day | ORAL | Status: AC

## 2023-12-04 NOTE — Discharge Summary (Signed)
Physician Discharge Summary  [name]. ZOX:[id number] DOB: Mar 30, 1946 DOA: 11/23/2023  PCP: Mliss Sax, MD  Admit date: 11/23/2023 Discharge date: 12/04/2023  Admitted From: Home Disposition:  Home  Discharge Condition:Stable CODE STATUS:DNR Diet recommendation: Carb consistent  Brief/Interim Summary: Patient is a 78 year old male with history of Parkinson disease, diabetes type 2, complete heart block status post pacemaker placement, COPD, hypertension who was brought here from home for evaluation of increased confusion, weakness, dysuria.He lives alone.  On presentation, he was hypotensive ,tachycardiac, tachypneic.  Lab work showed WC count of 22,000, sodium of 124, creatinine of 1.49.  UA suspicious for UTI.  Patient was started on ceftriaxone.  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  Started on Flomax.  Urine culture showed ESBL Klebsiella pneumoniae, started on meropenem.  Due to urinary retention, Foley placed,now removed.  Hospital course remarkable for agitation now calmer .  PT recommending SNF.TOC following.  S/P  5 days course of meropenem.   Medically stable for discharge.   Following problems were addressed during the hospitalization:  Sepsis secondary to UTI: Presented with leukocytosis, tachycardia, tachypnea.  Currently hemodynamically stable.  Afebrile.  Sepsis physiology has resolved.  Blood cultures did not show any growth.  Urine culture showed Klebsiella pneumonia.   ESBL UTI: Urine culture showed Klebsiella pneumoniae, ESBL.  Started on meropenem.  Completed 5 days course of antibiotics   Acute renal failure and CKD stage IIIb/urine retention: Baseline creatinine fluctuating from 1.2-1.5.  Creatinine worsened so Foley placed.  Renal ultrasound did not show any obstruction.  Creatinine normalized now,foley removed.   Continue Flomax,proscar.   Third-degree AV block: Status post  pacemaker placement   COPD: Currently not in exacerbation.  Continue bronchodilators as needed.On room air   Hypertension: Blood pressure currently stable.  Home losartan chlorthalidone on hold for now   Parkinson's disease:On  Sinemet.  Hospital course remarkable for intermittent agitation.  This morning he was calm and comfortable, alert and mostly oriented.  Continue Sinemet. Continue  low-dose Seroquel   Hyperlipidemia: On Lipitor   Type 2 diabetes: On sliding scale, metformin, Jardiance at home.   A1c of 7.2.  Continue current insulin regimen.  Metformin will be discontinued.   Vitamin B-12 deficiency: Low at 55.  Given few doses of  IM supplementation.  Continue oral supplementation daily.   Debility/deconditioning: PT/OT recommending SNF on discharge.  TOC following.  Discharge Diagnoses:  Principal Problem:   Sepsis (HCC) Active Problems:   UTI (urinary tract infection)   ARF (acute renal failure) (HCC)   Hyponatremia   Frequent falls   AV block, 3rd degree (HCC)   COPD (chronic obstructive pulmonary disease) (HCC)   Essential hypertension   Parkinson's disease (HCC)   Mixed hyperlipidemia   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, bilateral (HCC)   B12 deficiency    Discharge Instructions  Discharge Instructions     Diet Carb Modified   Complete by: As directed    Discharge instructions   Complete by: As directed    1)Please take your medications as instructed 2)Monitor your blood sugars 3)Do a BMP test in a week to check your kidney function.   Increase activity slowly   Complete by: As directed       Allergies as of 12/04/2023   No Known Allergies      Medication List     STOP taking these medications    chlorthalidone 25 MG  tablet Commonly known as: HYGROTON   losartan 100 MG tablet Commonly known as: COZAAR   metFORMIN 1000 MG tablet Commonly known as: GLUCOPHAGE       TAKE these  medications    acetaminophen 500 MG tablet Commonly known as: TYLENOL Take 500 mg by mouth every 6 (six) hours as needed for moderate pain.   albuterol 108 (90 Base) MCG/ACT inhaler Commonly known as: VENTOLIN HFA Inhale 2 puffs into the lungs every 6 (six) hours as needed for wheezing or shortness of breath.   aspirin EC 81 MG tablet Take 81 mg by mouth daily.   atorvastatin 40 MG tablet Commonly known as: LIPITOR Take 1 tablet (40 mg total) by mouth daily.   carbidopa-levodopa 25-100 MG tablet Commonly known as: SINEMET IR TAKE 1.5 TABLETS BY MOUTH 3 (THREE) TIMES DAILY. 6AM/10-11AM/3-4PM   cyanocobalamin 1000 MCG tablet Take 1 tablet (1,000 mcg total) by mouth daily. Start taking on: December 05, 2023   dorzolamide-timolol 2-0.5 % ophthalmic solution Commonly known as: COSOPT Place 1 drop into the left eye 2 (two) times daily.   finasteride 5 MG tablet Commonly known as: PROSCAR Take 1 tablet (5 mg total) by mouth daily.   Glucose 15 g Pack Take 15 g by mouth as needed (hypoglycemia).   insulin aspart 100 UNIT/ML injection Commonly known as: novoLOG Inject 0-9 Units into the skin 3 (three) times daily with meals. CBG < 70:treat low blood sugar CBG 70 - 120: 0 units  CBG 121 - 150: 1 unit  CBG 151 - 200: 2 units  CBG 201 - 250: 3 units  CBG 251 - 300: 5 units  CBG 301 - 350: 7 units  CBG 351 - 400: 9 units  CBG > 400: call MD   insulin glargine-yfgn 100 UNIT/ML injection Commonly known as: SEMGLEE Inject 0.2 mLs (20 Units total) into the skin daily. Start taking on: December 05, 2023   Jardiance 10 MG Tabs tablet Generic drug: empagliflozin Take 10 mg by mouth daily.   Metamucil Fiber Chew Chew 1 each by mouth daily.   Misc. Archivist. G20.A2   omeprazole 40 MG capsule Commonly known as: PRILOSEC Take 1 capsule (40 mg total) by mouth daily.   QUEtiapine 25 MG tablet Commonly known as: SEROQUEL Take 1 tablet (25 mg total) by mouth at  bedtime.   tamsulosin 0.4 MG Caps capsule Commonly known as: FLOMAX Take 1 capsule (0.4 mg total) by mouth daily after supper.   tobramycin 0.3 % ophthalmic solution Commonly known as: TOBREX Place 1 drop into the left eye daily as needed (to prevent infection when getting injection).        Contact information for after-discharge care     Destination     [facility] Preferred SNF .   Service: Skilled Nursing Contact information: [address] [place] [address] [phone]                    No Known Allergies  Consultations: None   Procedures/Studies: US RENAL Result Date: 11/26/2023 CLINICAL DATA:  Acute kidney injury EXAM: RENAL / URINARY TRACT ULTRASOUND COMPLETE COMPARISON:  CT 11/24/2023 FINDINGS: Right Kidney: Renal measurements: 12.9 x 6.5 x 8.9 cm = volume: 397 mL. Slightly increased echogenicity. Mild fullness of the renal collecting system. 3 cm cyst in the midportion. Left Kidney: Renal measurements: 13.2 x 6.7 x 7.3 cm = volume: 341 mL. Slightly increased echogenicity. Mild fullness of the renal collecting  system. 2.5 cm cyst in the midportion. Bladder: Nonvisualization of the bladder. Other: None. IMPRESSION: 1. Enlarged kidneys with slightly increased echogenicity of the kidneys bilaterally. Mild fullness of the renal collecting systems bilaterally. Findings consistent with acute nephritis. Fullness of the collecting systems not specifically explained. Sonographer did not find the bladder, but it appear to be distended on the CT study of 2 days ago. Electronically Signed   By: Paulina Fusi M.D.   On: 11/26/2023 13:49   CT HEAD WO CONTRAST ( ) Result Date: 11/24/2023 CLINICAL DATA:  Mental status change.  Neck trauma. EXAM: CT HEAD WITHOUT CONTRAST CT CERVICAL SPINE WITHOUT CONTRAST TECHNIQUE: Multidetector CT imaging of the head and cervical spine was performed following the standard protocol without intravenous contrast.  Multiplanar CT image reconstructions of the cervical spine were also generated. RADIATION DOSE REDUCTION: This exam was performed according to the departmental dose-optimization program which includes automated exposure control, adjustment of the mA and/or kV according to patient size and/or use of iterative reconstruction technique. COMPARISON:  Head CT 11/16/2023 FINDINGS: CT HEAD FINDINGS Brain: No evidence of acute infarction, hemorrhage, hydrocephalus, extra-axial collection or mass lesion/mass effect. There is mild diffuse low-attenuation within the subcortical and periventricular white matter compatible with chronic microvascular disease. Prominence of the sulci and ventricles compatible with brain atrophy. Vascular: No hyperdense vessel or unexpected calcification. Skull: Normal. Negative for fracture or focal lesion. Sinuses/Orbits: No acute finding. Other: None. CT CERVICAL SPINE FINDINGS Alignment: No acute posttraumatic malalignment. Reversal of normal cervical lordosis may reflect patient positioning or muscle spasm. Skull base and vertebrae: No acute fracture. No primary bone lesion or focal pathologic process. Soft tissues and spinal canal: No acute fracture. No primary bone lesion or focal pathologic process. Disc levels: Disc space narrowing and endplate spurring noted at C4-5, C5-6, and C6-7. Upper chest: Negative. Other: Choose 1 IMPRESSION: 1. No acute intracranial abnormalities. 2. Chronic microvascular disease and brain atrophy. 3. No evidence for cervical spine fracture or subluxation. 4. Cervical degenerative disc disease. Electronically Signed   By: Signa Kell M.D.   On: 11/24/2023 08:29   CT CERVICAL SPINE WO CONTRAST Result Date: 11/24/2023 CLINICAL DATA:  Mental status change.  Neck trauma. EXAM: CT HEAD WITHOUT CONTRAST CT CERVICAL SPINE WITHOUT CONTRAST TECHNIQUE: Multidetector CT imaging of the head and cervical spine was performed following the standard protocol without  intravenous contrast. Multiplanar CT image reconstructions of the cervical spine were also generated. RADIATION DOSE REDUCTION: This exam was performed according to the departmental dose-optimization program which includes automated exposure control, adjustment of the mA and/or kV according to patient size and/or use of iterative reconstruction technique. COMPARISON:  Head CT 11/16/2023 FINDINGS: CT HEAD FINDINGS Brain: No evidence of acute infarction, hemorrhage, hydrocephalus, extra-axial collection or mass lesion/mass effect. There is mild diffuse low-attenuation within the subcortical and periventricular white matter compatible with chronic microvascular disease. Prominence of the sulci and ventricles compatible with brain atrophy. Vascular: No hyperdense vessel or unexpected calcification. Skull: Normal. Negative for fracture or focal lesion. Sinuses/Orbits: No acute finding. Other: None. CT CERVICAL SPINE FINDINGS Alignment: No acute posttraumatic malalignment. Reversal of normal cervical lordosis may reflect patient positioning or muscle spasm. Skull base and vertebrae: No acute fracture. No primary bone lesion or focal pathologic process. Soft tissues and spinal canal: No acute fracture. No primary bone lesion or focal pathologic process. Disc levels: Disc space narrowing and endplate spurring noted at C4-5, C5-6, and C6-7. Upper chest: Negative. Other: Choose 1 IMPRESSION: 1. No acute  intracranial abnormalities. 2. Chronic microvascular disease and brain atrophy. 3. No evidence for cervical spine fracture or subluxation. 4. Cervical degenerative disc disease. Electronically Signed   By: Signa Kell M.D.   On: 11/24/2023 08:29   CT RENAL STONE STUDY Result Date: 11/24/2023 CLINICAL DATA:  Abdominal/flank pain. EXAM: CT ABDOMEN AND PELVIS WITHOUT CONTRAST TECHNIQUE: Multidetector CT imaging of the abdomen and pelvis was performed following the standard protocol without IV contrast. RADIATION DOSE  REDUCTION: This exam was performed according to the departmental dose-optimization program which includes automated exposure control, adjustment of the mA and/or kV according to patient size and/or use of iterative reconstruction technique. COMPARISON:  None Available. FINDINGS: Lower chest: Dependent opacity in both lower lobes suspected be atelectasis. Hepatobiliary: Liver normal in size and overall attenuation. Few small calcifications in the right lobe consistent with healed granuloma. No liver mass. Gallbladder is distended with multiple dependent stones. No wall thickening or adjacent inflammation. No bile duct dilation. Pancreas: Unremarkable. No pancreatic ductal dilatation or surrounding inflammatory changes. Spleen: Spleen normal in size. Low-attenuation 2 cm mass, posteroinferior spleen, consistent with a cyst or hemangioma. No other abnormality. Adrenals/Urinary Tract: No adrenal mass. Kidneys normal in overall size orientation and position. Two low-attenuation masses consistent with cysts on the left, upper pole 2.6 cm, midpole 2.3 cm. Exophytic low-attenuation mass, medial lower pole the right kidney, 1.6 cm. These are consistent cysts with no follow-up recommended. No intrarenal stones. Mild dilation of both intrarenal collecting systems proximal to mid ureters. No ureteral stones. Bladder is distended. Wall mildly thickened. Small amount of air tracks along the right bladder wall. There is adjacent fat haziness and fluid attenuation consistent with inflammation. No bladder mass or stone. Stomach/Bowel: Stomach is unremarkable. Small bowel and colon are normal in caliber. No wall thickening. No inflammation. Multiple colonic diverticula, mostly on the left. Vascular/Lymphatic: Aortic atherosclerotic calcifications. No aneurysm. No enlarged lymph nodes. Reproductive: Enlarged prostate measuring 6.5 x 5.7 x 6.2 cm. Surgical vascular clips noted in the anterior inferior pelvis, extraperitoneal. Other: No  ascites. Musculoskeletal: No fracture or acute finding.  No bone lesion. IMPRESSION: 1. Bladder is distended with mild wall thickening and adjacent inflammatory changes, as well as a small amount of air along the right lateral wall. Findings consistent with emphysematous cystitis. 2. No other evidence of an acute abnormality within the abdomen or pelvis. 3. Mild bilateral hydroureteronephrosis is presumed due to the bladder distension. No renal or ureteral stones. 4. Multiple gallstones, colonic diverticulosis and aortic atherosclerosis. 5. Prostate hypertrophy.  Suspect bladder outlet obstruction. Electronically Signed   By: Amie Portland M.D.   On: 11/24/2023 08:26   DG Chest Port 1 View Result Date: 11/23/2023 CLINICAL DATA:  Fall, possible sepsis EXAM: PORTABLE CHEST 1 VIEW COMPARISON:  08/25/2023 FINDINGS: Left-sided pacing device as before. Scarring or atelectasis at the lingula and bilateral bases. No pleural effusion or pneumothorax. Normal cardiomediastinal silhouette with aortic atherosclerosis. IMPRESSION: No active disease. Scarring or atelectasis at the lingula and bilateral bases. Electronically Signed   By: Jasmine Pang M.D.   On: 11/23/2023 23:10   CT Head Wo Contrast Result Date: 11/16/2023 CLINICAL DATA:  Dizziness EXAM: CT HEAD WITHOUT CONTRAST TECHNIQUE: Contiguous axial images were obtained from the base of the skull through the vertex without intravenous contrast. RADIATION DOSE REDUCTION: This exam was performed according to the departmental dose-optimization program which includes automated exposure control, adjustment of the mA and/or kV according to patient size and/or use of iterative reconstruction technique. COMPARISON:  MRI 08/17/2023 FINDINGS: Brain: No acute territorial infarction, hemorrhage or intracranial mass. Atrophy and mild chronic small vessel ischemic changes of the white matter. Stable ventricle size Vascular: No hyperdense vessels. Mild carotid vascular calcification  Skull: Normal. Negative for fracture or focal lesion. Sinuses/Orbits: No acute finding. Other: None IMPRESSION: 1. No CT evidence for acute intracranial abnormality. 2. Atrophy and mild chronic small vessel ischemic changes of the white matter. Electronically Signed   By: Jasmine Pang M.D.   On: 11/16/2023 18:42      Subjective: Patient seen and examined at bedside today.  He was sitting at the edge of bed.  Mostly alert and oriented.  Not agitated.  Medically stable for discharge to SNF today.  Discharge Exam: Vitals:   12/04/23 0631 12/04/23 0754  BP: 130/74 130/78  Pulse: 98 98  Resp:  18  Temp:  98.1 F (36.7 C)  SpO2: 97% 96%   Vitals:   12/03/23 1550 12/03/23 2226 12/04/23 0631 12/04/23 0754  BP: (!) 151/72 129/86 130/74 130/78  Pulse: 86 (!) 102 98 98  Resp:  19  18  Temp: 98.5 F (36.9 C) 98 F (36.7 C)  98.1 F (36.7 C)  TempSrc:  Oral  Oral  SpO2: 98% 96% 97% 96%  Weight:      Height:        General: Pt is alert, awake, not in acute distress Cardiovascular: RRR, S1/S2 +, no rubs, no gallops Respiratory: CTA bilaterally, no wheezing, no rhonchi Abdominal: Soft, NT, ND, bowel sounds + Extremities: no edema, no cyanosis    The results of significant diagnostics from this hospitalization (including imaging, microbiology, ancillary and laboratory) are listed below for reference.     Microbiology: No results found for this or any previous visit (from the past 240 hours).   Labs: BNP (last 3 results) No results for input(s): "BNP" in the last 8760 hours. Basic Metabolic Panel: Recent Labs  Lab 11/28/23 0843 12/01/23 0700 12/03/23 0730  NA 135 140 137  K 3.6 3.8 4.2  CL 101 100 100  CO2 24 27 27   GLUCOSE 208* 204* 229*  BUN 27* 29* 36*  CREATININE 1.17 1.51* 1.73*  CALCIUM 9.4 9.8 9.6   Liver Function Tests: No results for input(s): "AST", "ALT", "ALKPHOS", "BILITOT", "PROT", "ALBUMIN" in the last 168 hours. No results for input(s): "LIPASE",  "AMYLASE" in the last 168 hours. No results for input(s): "AMMONIA" in the last 168 hours. CBC: Recent Labs  Lab 12/03/23 0730  WBC 8.1  HGB 12.2*  HCT 36.3*  MCV 78.9*  PLT 299   Cardiac Enzymes: No results for input(s): "CKTOTAL", "CKMB", "CKMBINDEX", "TROPONINI" in the last 168 hours. BNP: Invalid input(s): "POCBNP" CBG: Recent Labs  Lab 12/03/23 1215 12/03/23 1551 12/03/23 2058 12/04/23 0804 12/04/23 0844  GLUCAP 242* 203* 160* 202* 209*   D-Dimer No results for input(s): "DDIMER" in the last 72 hours. Hgb A1c No results for input(s): "HGBA1C" in the last 72 hours. Lipid Profile No results for input(s): "CHOL", "HDL", "LDLCALC", "TRIG", "CHOLHDL", "LDLDIRECT" in the last 72 hours. Thyroid function studies No results for input(s): "TSH", "T4TOTAL", "T3FREE", "THYROIDAB" in the last 72 hours.  Invalid input(s): "FREET3" Anemia work up No results for input(s): "VITAMINB12", "FOLATE", "FERRITIN", "TIBC", "IRON", "RETICCTPCT" in the last 72 hours. Urinalysis    Component Value Date/Time   COLORURINE YELLOW 11/23/2023 2245   APPEARANCEUR HAZY (A) 11/23/2023 2245   LABSPEC 1.014 11/23/2023 2245   PHURINE 5.0 11/23/2023 2245  GLUCOSEU >=500 (A) 11/23/2023 2245   GLUCOSEU 500 (A) 11/21/2023 1516   HGBUR NEGATIVE 11/23/2023 2245   BILIRUBINUR NEGATIVE 11/23/2023 2245   KETONESUR 5 (A) 11/23/2023 2245   PROTEINUR NEGATIVE 11/23/2023 2245   UROBILINOGEN 0.2 11/21/2023 1516   NITRITE NEGATIVE 11/23/2023 2245   LEUKOCYTESUR TRACE (A) 11/23/2023 2245   Sepsis Labs Recent Labs  Lab 12/03/23 0730  WBC 8.1   Microbiology No results found for this or any previous visit (from the past 240 hours).  Please note: You were cared for by a hospitalist during your hospital stay. Once you are discharged, your primary care physician will handle any further medical issues. Please note that NO REFILLS for any discharge medications will be authorized once you are discharged, as  it is imperative that you return to your primary care physician (or establish a relationship with a primary care physician if you do not have one) for your post hospital discharge needs so that they can reassess your need for medications and monitor your lab values.    Time coordinating discharge: 40 minutes  SIGNED:   Burnadette Pop, MD  [facility] 12/04/2023, 10:08 AM Pager [phone]  If 7PM-7AM, please contact night-coverage [email or web address] Password [email or web address]

## 2023-12-04 NOTE — TOC Transition Note (Signed)
Transition of Care [facility]) - Discharge Note   Patient Details  Name: Karl Lawson. MRN: [id number] Date of Birth: [birth year]  [facility]) CM/SW Contact:  Danisha Brassfield A [place], LCSWA Phone Number: 12/04/2023, 10:40 AM   Clinical Narrative:     Patient will DC to: [facility]  Anticipated DC date: 12/04/23  Family notified: Roxanna Mew  Transport by: Sharin Mons      Per MD patient ready for DC to .[facility] RN, patient, patient's family, and facility notified of DC. Discharge Summary and FL2 sent to facility. RN to call report prior to discharge (Rom [phone]). DC packet on chart. Ambulance transport requested for patient.     CSW will sign off for now as social work intervention is no longer needed. Please consult [place] again if new needs arise.   Final next level of care: Skilled Nursing Facility Barriers to Discharge: Barriers Resolved   Patient Goals and CMS Choice Patient states their goals for this hospitalization and ongoing recovery are:: SNF CMS Medicare.gov Compare Post Acute Care list provided to:: Patient Represenative (must comment) Bonnie Roig; Son; [phone]) Choice offered to / list presented to : Adult Children Ayvion Kavanagh; Son; [phone])      Discharge Placement              Patient chooses bed at: [facility] Patient to be transferred to facility by: [facility] Name of family member notified: Abimelec Grochowski Patient and family notified of of transfer: 12/04/23  Discharge Plan and Services Additional resources added to the After Visit Summary for   In-house Referral: Clinical Social Work   Post Acute Care Choice: Skilled Nursing Facility                               Social Drivers of Health (SDOH) Interventions SDOH Screenings   Food Insecurity: No Food Insecurity (11/24/2023)  Housing: Low Risk  (11/24/2023)  Transportation Needs: No Transportation Needs (11/24/2023)  Utilities:  Not At Risk (11/24/2023)  Alcohol Screen: Low Risk  (11/14/2023)  Depression (PHQ2-9): Low Risk  (11/15/2023)  Financial Resource Strain: Low Risk  (11/14/2023)  Physical Activity: Sufficiently Active (11/14/2023)  Social Connections: Moderately Integrated (11/24/2023)  Stress: No Stress Concern Present (05/09/2023)  Tobacco Use: Medium Risk (11/24/2023)  Health Literacy: Adequate Health Literacy (09/19/2023)     Readmission Risk Interventions     No data to display

## 2023-12-04 NOTE — Progress Notes (Signed)
Mobility Specialist: Progress Note   12/04/23 1235  Mobility  Activity Ambulated independently in hallway  Level of Assistance Contact guard assist, steadying assist  Assistive Device Front wheel walker  Distance Ambulated (ft) 50 ft  Activity Response Tolerated well  Mobility Referral Yes  Mobility visit 1 Mobility  Mobility Specialist Start Time (ACUTE ONLY) 0955  Mobility Specialist Stop Time (ACUTE ONLY) 1009  Mobility Specialist Time Calculation (min) (ACUTE ONLY) 14 min    Pt was agreeable to mobility session - received in bed. CG throughout. No complaints. Returned to room without fault. Left in bed with all needs met, call bell in reach.   Maurene Capes Mobility Specialist Please contact via SecureChat or Rehab office at [phone]

## 2023-12-05 ENCOUNTER — Encounter: Payer: Self-pay | Admitting: Internal Medicine

## 2023-12-08 ENCOUNTER — Encounter: Payer: Medicare Other | Admitting: Cardiology

## 2023-12-12 ENCOUNTER — Encounter: Payer: Medicare Other | Admitting: Internal Medicine

## 2023-12-15 ENCOUNTER — Telehealth: Payer: Self-pay

## 2023-12-15 NOTE — Patient Instructions (Signed)
 Visit Information  Thank you for taking time to visit with me today. Please don't hesitate to contact me if I can be of assistance to you.   Following are the goals we discussed today:   Goals Addressed             This Visit's Progress    Diabetes and Parkinson's Management       Care Coordination Interventions: Evaluation of current treatment plan related to Parkinson's and patient's adherence to plan as established by provider Discussed plans with patient for ongoing care management follow up and provided patient with direct contact information for care management team Provided education to patient about basic DM disease process Reviewed medications with patient and discussed importance of medication adherence  Spoke with patient. He reports is still unable to walk right now.  He remains at [facility].  He states that discharge talks 12-27-23 but if he is not walking by them they can extend.  Discussed continuing to work with rehab with goal of going home.  He verbalized understanding.            If you are experiencing a Mental Health or Behavioral Health Crisis or need someone to talk to, please call the Suicide and Crisis Lifeline: 988   Patient verbalizes understanding of instructions and care plan provided today and agrees to view in MyChart. Active MyChart status and patient understanding of how to access instructions and care plan via MyChart confirmed with patient.     The patient has been provided with contact information for the care management team and has been advised to call with any health related questions or concerns.   Yunis Voorheis J. Gene Colee RN, MSN [facility], [facility] RN Care Manager Direct Dial: [phone]  Fax: [phone] Website: [email or web address]

## 2023-12-15 NOTE — Patient Outreach (Signed)
  Care Coordination   Follow Up Visit Note   12/15/2023 Name: Karl Lawson. MRN: [id number] DOB: 1946-05-31  Karl Lawson. is a 78 y.o. year old male who sees Berneta Elsie Sayre, MD for primary care. I spoke with  Karl Lawson. by phone today.  What matters to the patients health and wellness today?  Being strong enough to walk    Goals Addressed             This Visit's Progress    Diabetes and Parkinson's Management       Care Coordination Interventions: Evaluation of current treatment plan related to Parkinson's and patient's adherence to plan as established by provider Discussed plans with patient for ongoing care management follow up and provided patient with direct contact information for care management team Provided education to patient about basic DM disease process Reviewed medications with patient and discussed importance of medication adherence  Spoke with patient. He reports is still unable to walk right now.  He remains at [facility].  He states that discharge talks 12-27-23 but if he is not walking by them they can extend.  Discussed continuing to work with rehab with goal of going home.  He verbalized understanding.         SDOH assessments and interventions completed:  Yes     Care Coordination Interventions:  Yes, provided   Follow up plan:  RN CM will follow up as appropriate pending SNF discharge    Encounter Outcome:  Patient Visit Completed   Lavaun DOROTHA Seeds RN, MSN [facility]  [facility], [facility] RN Care Manager Direct Dial: [phone]  Fax: [phone] Website: [email or web address]

## 2023-12-22 ENCOUNTER — Telehealth: Payer: Self-pay | Admitting: Family Medicine

## 2023-12-22 ENCOUNTER — Ambulatory Visit: Payer: Medicare Other | Admitting: Family Medicine

## 2023-12-22 ENCOUNTER — Telehealth: Payer: Self-pay

## 2023-12-22 NOTE — Telephone Encounter (Signed)
Patient dropped off document Home Health Certificate (Order ID [id number]), to be filled out by provider. Patient requested to send it back via Fax within 7-days. Document is located in providers tray at front office.Please advise at  [phone]

## 2023-12-22 NOTE — Telephone Encounter (Signed)
Copied from CRM [id number]. Topic: General - Other >> Dec 22, 2023  8:07 AM Truddie Crumble wrote: Reason for CRM: patient son called stating he has a [facility] form that he would like the doctor to fill out for the patient . Patient son stated he will drop it off by the office

## 2023-12-25 ENCOUNTER — Telehealth: Payer: Self-pay

## 2023-12-25 ENCOUNTER — Telehealth: Payer: Self-pay | Admitting: Family Medicine

## 2023-12-25 NOTE — Telephone Encounter (Unsigned)
 Copied from CRM [id number]. Topic: Clinical - Medical Advice >> Dec 25, 2023  4:01 PM Almira Coaster wrote: Reason for CRM: Patient is being discharged from rehab and will be going to assistant living and his son was calling to see if his insulin prescription is up to date and is the right amount he should be taking, advised that an appointment may be required to verify if any changes needed to be made. Declined stated he was unable to bring patient in.

## 2023-12-25 NOTE — Telephone Encounter (Signed)
 Copied from CRM [id number]. Topic: General - Other >> Dec 25, 2023  8:26 AM Irine Seal wrote: Reason for CRM: patient's son Karl Lawson, calling on behalf of the patient, Karl Lawson stated that patient is going into assisted living facility- they are requesting a providers note that the patient can either self medicate or needs med management. Karl Lawson would like it to be sent directly to the facility if possible   [facility] [address], [place], [place] [address]  [phone] Phone Could not locate fax

## 2023-12-25 NOTE — Transitions of Care (Post Inpatient/ED Visit) (Unsigned)
   12/25/2023  Name: Karl Lawson. MRN: [id number] DOB: [birth year]  Today's TOC FU Call Status: Today's TOC FU Call Status:: Unsuccessful Call (1st Attempt) Unsuccessful Call (1st Attempt) Date: 12/25/23  Attempted to reach the patient regarding the most recent Inpatient/ED visit.  Follow Up Plan: Additional outreach attempts will be made to reach the patient to complete the Transitions of Care (Post Inpatient/ED visit) call.   Signature Karena Addison, LPN [facility] Nurse Health Advisor Direct Dial [phone]

## 2023-12-26 NOTE — Telephone Encounter (Signed)
 Copied from CRM [id number]. Topic: General - Other >> Dec 25, 2023  4:34 PM [name] wrote: Reason for CRM: Patient son Karl Lawson is requesting a FL2 Form to be sent over to [employer] on [address] Phone number - [phone].

## 2023-12-26 NOTE — Transitions of Care (Post Inpatient/ED Visit) (Signed)
 12/26/2023  Name: Karl Lawson. MRN: [id number] DOB: 04-08-1946  Today's TOC FU Call Status: Today's TOC FU Call Status:: Successful TOC FU Call Completed Unsuccessful Call (1st Attempt) Date: 12/25/23 [facility] FU Call Complete Date: 12/26/23 Patient's Name and Date of Birth confirmed.  Transition Care Management Follow-up Telephone Call Date of Discharge: 12/23/23 Discharge Facility: Other [facility]) Name of Other ([facility]) Discharge Facility: [facility] Type of Discharge: Inpatient Admission Primary Inpatient Discharge Diagnosis:: bradycardia How have you been since you were released from the hospital?: Better Any questions or concerns?: No  Items Reviewed: Did you receive and understand the discharge instructions provided?: Yes Medications obtained,verified, and reconciled?: Yes (Medications Reviewed) Any new allergies since your discharge?: No Dietary orders reviewed?: Yes Do you have support at home?: Yes People in Home: child(ren), adult  Medications Reviewed Today: Medications Reviewed Today     Reviewed by Karena Addison, LPN (Licensed Practical Nurse) on 12/26/23 at 1226  Med List Status: <None>   Medication Order Taking? Sig Documenting Provider Last Dose Status Informant  acetaminophen (TYLENOL) 500 MG tablet [id number] No Take 500 mg by mouth every 6 (six) hours as needed for moderate pain. [provider] Past Week Active Child, Pharmacy Records  albuterol (VENTOLIN HFA) 108 (90 Base) MCG/ACT inhaler [id number] No Inhale 2 puffs into the lungs every 6 (six) hours as needed for wheezing or shortness of breath. [provider] Past Month Active Child, Pharmacy Records  aspirin EC 81 MG tablet [id number] No Take 81 mg by mouth daily. [provider] 11/23/2023 Morning Active Child, Pharmacy Records           Med Note (WHITE, Elvin So   Fri Nov 24, 2023  1:28 AM)    atorvastatin (LIPITOR) 40 MG tablet [id number] No Take 1 tablet (40 mg  total) by mouth daily. Zigmund Daniel., MD Past Week Active            Med Note (WHITE, Elvin So   Fri Nov 24, 2023  1:29 AM) Ran out, waiting on refill prescription  carbidopa-levodopa (SINEMET IR) 25-100 MG tablet [id number] No TAKE 1.5 TABLETS BY MOUTH 3 (THREE) TIMES DAILY. 6AM/10-11AM/3-4PM Vladimir Faster, DO 11/23/2023 Morning Active Child, Pharmacy Records  cyanocobalamin 1000 MCG tablet [id number]  Take 1 tablet (1,000 mcg total) by mouth daily. Burnadette Pop, MD  Active   dorzolamide-timolol (COSOPT) 2-0.5 % ophthalmic solution [id number] No Place 1 drop into the left eye 2 (two) times daily.  Patient not taking: Reported on 11/24/2023   [provider] Not Taking Active Multiple Informants, Pharmacy Records           Med Note (WHITE, Elvin So   Fri Nov 24, 2023  7:27 AM) No documentation to show that this is still an active prescription. Patient mentioned not using, however, it hasn't been removed by provider.  finasteride (PROSCAR) 5 MG tablet [id number] No Take 1 tablet (5 mg total) by mouth daily. Mliss Sax, MD 11/23/2023 Morning Active Child, Pharmacy Records  Glucose 15 g PACK [id number] No Take 15 g by mouth as needed (hypoglycemia). [provider] Taking Active Child, Pharmacy Records  insulin aspart (NOVOLOG) 100 UNIT/ML injection [id number] No Inject 0-9 Units into the skin 3 (three) times daily with meals. CBG < 70:treat low blood sugar CBG 70 - 120: 0 units  CBG 121 - 150: 1 unit  CBG 151 - 200: 2 units  CBG 201 - 250: 3 units  CBG 251 - 300:  5 units  CBG 301 - 350: 7 units  CBG 351 - 400: 9 units  CBG > 400: call MD Zigmund Daniel., MD 11/22/2023 Active   insulin glargine-yfgn (SEMGLEE) 100 UNIT/ML injection [id number]  Inject 0.2 mLs (20 Units total) into the skin daily. Burnadette Pop, MD  Active   JARDIANCE 10 MG TABS tablet [id number] No Take 10 mg by mouth daily. [provider] 11/23/2023 Morning Active Child, Pharmacy  Records  Metamucil Fiber CHEW [id number] No Chew 1 each by mouth daily. [provider] 11/23/2023 Morning Active Child, Pharmacy Records  Misc. Devices MISC [id number] No Rollator. G20.Germain Osgood, FNP Taking Active   omeprazole (PRILOSEC) 40 MG capsule [id number] No Take 1 capsule (40 mg total) by mouth daily. Mliss Sax, MD 11/23/2023 Morning Active Child, Pharmacy Records  QUEtiapine (SEROQUEL) 25 MG tablet [id number]  Take 1 tablet (25 mg total) by mouth at bedtime. Burnadette Pop, MD  Active   tamsulosin [facility]) 0.4 MG CAPS capsule [id number]  Take 1 capsule (0.4 mg total) by mouth daily after supper. Burnadette Pop, MD  Active   tobramycin (TOBREX) 0.3 % ophthalmic solution [id number] No Place 1 drop into the left eye daily as needed (to prevent infection when getting injection). [provider] 11/22/2023 Active Multiple Informants, Pharmacy Records           Med Note (WHITE, Elvin So   Fri Nov 24, 2023  7:25 AM) Patient said he's taking as needed, however, there is no documentation to verify it. Called patient's son, but he has no idea if the patient is still using the eye drops.            Home Care and Equipment/Supplies: Were Home Health Services Ordered?: NA (moving to Asisted living) Any new equipment or medical supplies ordered?: NA  Functional Questionnaire: Do you need assistance with bathing/showering or dressing?: Yes Do you need assistance with meal preparation?: Yes Do you need assistance with eating?: No Do you have difficulty maintaining continence: No Do you need assistance with getting out of bed/getting out of a chair/moving?: No Do you have difficulty managing or taking your medications?: Yes  Follow up appointments reviewed: PCP Follow-up appointment confirmed?: No (declined appt, will call back) MD Provider Line [phone] Given: No Specialist Hospital Follow-up appointment confirmed?: NA Do you need transportation to  your follow-up appointment?: No Do you understand care options if your condition(s) worsen?: Yes-patient verbalized understanding    SIGNATURE Karena Addison, LPN [facility] Nurse Health Advisor Direct Dial [phone]

## 2024-01-01 ENCOUNTER — Telehealth: Payer: Self-pay

## 2024-01-01 NOTE — Patient Instructions (Signed)
 Visit Information  Thank you for taking time to visit with me today. Please don't hesitate to contact me if I can be of assistance to you.   Following are the goals we discussed today:   Goals Addressed             This Visit's Progress    COMPLETED: Diabetes and Parkinson's Management       Care Coordination Interventions: Evaluation of current treatment plan related to Parkinson's and patient's adherence to plan as established by provider Discussed plans with patient for ongoing care management follow up and provided patient with direct contact information for care management team Provided education to patient about basic DM disease process Reviewed medications with patient and discussed importance of medication adherence   2/24./25 spoke with son [name].  He reports patient is doing okay he has been discharged from rehab and now resides at [facility] as him being at home alone is not safe.  Advised CM will close case as patient now in assisted living with care needs being met.           If you are experiencing a Mental Health or Behavioral Health Crisis or need someone to talk to, please call the Suicide and Crisis Lifeline: 988   Patient verbalizes understanding of instructions and care plan provided today and agrees to view in MyChart. Active MyChart status and patient understanding of how to access instructions and care plan via MyChart confirmed with patient.     No further follow up required: Patient residing in assisted living  Bary Leriche RN, MSN [facility], [facility] RN Care Manager Direct Dial: [phone]  Fax: [phone] Website: [email or web address]

## 2024-01-01 NOTE — Patient Outreach (Signed)
 Care Coordination   Follow Up Visit Note   01/01/2024 Name: Karl Lawson. MRN: [id number] DOB: January 01, 1946  Karl Lawson. is a 78 y.o. year old male who sees Mliss Sax, MD for primary care. I  spoke with son Karl Lawson(DPR) by phone today.  What matters to the patients health and wellness today?  N/A    Goals Addressed             This Visit's Progress    COMPLETED: Diabetes and Parkinson's Management       Care Coordination Interventions: Evaluation of current treatment plan related to Parkinson's and patient's adherence to plan as established by provider Discussed plans with patient for ongoing care management follow up and provided patient with direct contact information for care management team Provided education to patient about basic DM disease process Reviewed medications with patient and discussed importance of medication adherence   2/24./25 spoke with son Karl Lawson.  He reports patient is doing okay he has been discharged from rehab and now resides at [facility] as him being at home alone is not safe.  Advised CM will close case as patient now in assisted living with care needs being met.          SDOH assessments and interventions completed:  Yes     Care Coordination Interventions:  Yes, provided   Follow up plan: No further intervention required.   Encounter Outcome:  Patient Visit Completed   Karl Leriche RN, MSN [facility], [facility] RN Care Manager Direct Dial: [phone]  Fax: [phone] Website: [email or web address]

## 2024-01-05 ENCOUNTER — Ambulatory Visit (INDEPENDENT_AMBULATORY_CARE_PROVIDER_SITE_OTHER)

## 2024-01-05 DIAGNOSIS — I4430 Unspecified atrioventricular block: Secondary | ICD-10-CM | POA: Diagnosis not present

## 2024-01-09 LAB — CUP PACEART REMOTE DEVICE CHECK
Battery Remaining Longevity: 140 mo
Battery Voltage: 3.2 V
Brady Statistic AP VP Percent: 9.08 %
Brady Statistic AP VS Percent: 0.07 %
Brady Statistic AS VP Percent: 90.2 %
Brady Statistic AS VS Percent: 0.65 %
Brady Statistic RA Percent Paced: 9.13 %
Brady Statistic RV Percent Paced: 99.28 %
Date Time Interrogation Session: 20250228181129
Implantable Lead Connection Status: 753985
Implantable Lead Connection Status: 753985
Implantable Lead Implant Date: 20241017
Implantable Lead Implant Date: 20241017
Implantable Lead Location: 753859
Implantable Lead Location: 753860
Implantable Lead Model: 3830
Implantable Lead Model: 5076
Implantable Pulse Generator Implant Date: 20241017
Lead Channel Impedance Value: 323 Ohm
Lead Channel Impedance Value: 361 Ohm
Lead Channel Impedance Value: 494 Ohm
Lead Channel Impedance Value: 570 Ohm
Lead Channel Pacing Threshold Amplitude: 0.625 V
Lead Channel Pacing Threshold Amplitude: 1 V
Lead Channel Pacing Threshold Pulse Width: 0.4 ms
Lead Channel Pacing Threshold Pulse Width: 0.4 ms
Lead Channel Sensing Intrinsic Amplitude: 20.625 mV
Lead Channel Sensing Intrinsic Amplitude: 20.625 mV
Lead Channel Sensing Intrinsic Amplitude: 6.25 mV
Lead Channel Sensing Intrinsic Amplitude: 6.25 mV
Lead Channel Setting Pacing Amplitude: 2 V
Lead Channel Setting Pacing Amplitude: 3.5 V
Lead Channel Setting Pacing Pulse Width: 0.4 ms
Lead Channel Setting Sensing Sensitivity: 1.2 mV
Zone Setting Status: 755011

## 2024-01-10 ENCOUNTER — Encounter: Payer: Self-pay | Admitting: Internal Medicine

## 2024-02-05 NOTE — Progress Notes (Signed)
 Remote pacemaker transmission.

## 2024-02-05 NOTE — Addendum Note (Signed)
 Addended by: Elease Etienne A on: 02/05/2024 10:15 AM   Modules accepted: Orders

## 2024-02-19 ENCOUNTER — Encounter: Payer: Self-pay | Admitting: Family Medicine

## 2024-02-19 ENCOUNTER — Ambulatory Visit (INDEPENDENT_AMBULATORY_CARE_PROVIDER_SITE_OTHER): Payer: Medicare Other | Admitting: Family Medicine

## 2024-02-19 VITALS — BP 118/74 | HR 83 | Temp 97.7°F | Ht 68.0 in | Wt 198.6 lb

## 2024-02-19 DIAGNOSIS — Z794 Long term (current) use of insulin: Secondary | ICD-10-CM

## 2024-02-19 DIAGNOSIS — E113593 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, bilateral: Secondary | ICD-10-CM

## 2024-02-19 DIAGNOSIS — R2681 Unsteadiness on feet: Secondary | ICD-10-CM | POA: Diagnosis not present

## 2024-02-19 LAB — BASIC METABOLIC PANEL WITH GFR
BUN: 20 mg/dL (ref 6–23)
CO2: 24 meq/L (ref 19–32)
Calcium: 10.2 mg/dL (ref 8.4–10.5)
Chloride: 102 meq/L (ref 96–112)
Creatinine, Ser: 0.98 mg/dL (ref 0.40–1.50)
GFR: 73.92 mL/min (ref >60.00)
Glucose, Bld: 227 mg/dL — ABNORMAL HIGH (ref 70–99)
Potassium: 3.9 meq/L (ref 3.5–5.1)
Sodium: 137 meq/L (ref 135–145)

## 2024-02-19 LAB — HEMOGLOBIN A1C: Hgb A1c MFr Bld: 9.7 % — ABNORMAL HIGH (ref 4.6–6.5)

## 2024-02-19 NOTE — Progress Notes (Signed)
 Established Patient Office Visit   Subjective:  Patient ID: Karl Lawson., male    DOB: [birth year]  Age: 78 y.o. MRN: [id number]  Chief Complaint  Patient presents with   Medical Management of Chronic Issues    3 month follow. Pt is not fasting.     HPI Encounter Diagnoses  Name Primary?   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  Accompanied by his sister-in-law Jan.  Follow-up of his type 2 diabetes.  He says that he is getting 2 shots in the morning before breakfast.  1 shot in the afternoon after lunch.  No insulin for the evening meal.  He is snacking at night but the pack of peanut butter crackers.  Fasting sugars have been up in the 200s.  He is scheduled to have NovoLog regular preprandially based on sliding scale and glargine at at bedtime.  History of Parkinson's disease and CVA.  He is using a rollator but describes a burning neuropathy in his left leg.  His leg sometimes gives way.  Denies knee or ankle pain.  Denies significant back pain.  He has been taking gabapentin for the neuropathy and this has been helpful.   Review of Systems  Constitutional: Negative.   HENT: Negative.    Eyes:  Negative for blurred vision, discharge and redness.  Respiratory: Negative.    Cardiovascular: Negative.   Gastrointestinal:  Negative for abdominal pain.  Genitourinary: Negative.   Musculoskeletal: Negative.  Negative for myalgias.  Skin:  Negative for rash.  Neurological:  Negative for tingling, loss of consciousness and weakness.  Endo/Heme/Allergies:  Negative for polydipsia.     Current Outpatient Medications:    acetaminophen (TYLENOL) 500 MG tablet, Take 500 mg by mouth every 6 (six) hours as needed for moderate pain., Disp: , Rfl:    albuterol (VENTOLIN HFA) 108 (90 Base) MCG/ACT inhaler, Inhale 2 puffs into the lungs  every 6 (six) hours as needed for wheezing or shortness of breath., Disp: , Rfl:    ascorbic Acid (VITAMIN C) 500 MG CPCR, Take 500 mg by mouth daily., Disp: , Rfl:    aspirin EC 81 MG tablet, Take 81 mg by mouth daily., Disp: , Rfl:    atorvastatin (LIPITOR) 40 MG tablet, Take 1 tablet (40 mg total) by mouth daily., Disp: , Rfl:    carbidopa-levodopa (SINEMET IR) 25-100 MG tablet, TAKE 1.5 TABLETS BY MOUTH 3 (THREE) TIMES DAILY. 6AM/10-11AM/3-4PM, Disp: 405 tablet, Rfl: 0   cyanocobalamin 1000 MCG tablet, Take 1 tablet (1,000 mcg total) by mouth daily., Disp: , Rfl:    dorzolamide-timolol (COSOPT) 2-0.5 % ophthalmic solution, Place 1 drop into the left eye 2 (two) times daily., Disp: , Rfl:    finasteride (PROSCAR) 5 MG tablet, Take 1 tablet (5 mg total) by mouth daily., Disp: 90 tablet, Rfl: 3   gabapentin (NEURONTIN) 100 MG capsule, Take by mouth., Disp: , Rfl:    Glucose 15 g PACK, Take 15 g by mouth as needed (hypoglycemia)., Disp: , Rfl:    insulin aspart (NOVOLOG) 100 UNIT/ML injection, Inject 0-9 Units into the skin 3 (three) times daily with meals. CBG < 70:treat low blood sugar CBG 70 - 120: 0 units  CBG 121 - 150: 1 unit  CBG 151 - 200: 2 units  CBG 201 - 250: 3 units  CBG  251 - 300: 5 units  CBG 301 - 350: 7 units  CBG 351 - 400: 9 units  CBG > 400: call MD, Disp: , Rfl:    insulin glargine-yfgn (SEMGLEE) 100 UNIT/ML injection, Inject 0.2 mLs (20 Units total) into the skin daily., Disp: , Rfl:    JARDIANCE 10 MG TABS tablet, Take 10 mg by mouth daily., Disp: , Rfl:    Metamucil Fiber CHEW, Chew 1 each by mouth daily., Disp: , Rfl:    metFORMIN (GLUCOPHAGE) 500 MG tablet, Take 500 mg by mouth 2 (two) times daily with a meal., Disp: , Rfl:    Misc. Devices MISC, Rollator. G20.A2, Disp: 1 each, Rfl: 0   omeprazole (PRILOSEC) 40 MG capsule, Take 1 capsule (40 mg total) by mouth daily., Disp: 90 capsule, Rfl: 3   QUEtiapine (SEROQUEL) 25 MG tablet, Take 1 tablet (25 mg total) by mouth at  bedtime., Disp: , Rfl:    tamsulosin (FLOMAX) 0.4 MG CAPS capsule, Take 1 capsule (0.4 mg total) by mouth daily after supper., Disp: , Rfl:    tobramycin (TOBREX) 0.3 % ophthalmic solution, Place 1 drop into the left eye daily as needed (to prevent infection when getting injection)., Disp: , Rfl:    Objective:     BP 118/74 (BP Location: Right Arm, Cuff Size: Large)   Pulse 83   Temp 97.7 F (36.5 C) (Temporal)   Ht 5\' 8"  (1.727 m)   Wt 198 lb 9.6 oz (90.1 kg)   SpO2 97%   BMI 30.20 kg/m    Physical Exam Constitutional:      General: He is not in acute distress.    Appearance: Normal appearance. He is not ill-appearing, toxic-appearing or diaphoretic.  HENT:     Head: Normocephalic and atraumatic.     Right Ear: External ear normal.     Left Ear: External ear normal.  Eyes:     General: No scleral icterus.       Right eye: No discharge.        Left eye: No discharge.     Extraocular Movements: Extraocular movements intact.     Conjunctiva/sclera: Conjunctivae normal.  Cardiovascular:     Rate and Rhythm: Normal rate and regular rhythm.  Pulmonary:     Effort: Pulmonary effort is normal. No respiratory distress.     Breath sounds: No wheezing, rhonchi or rales.  Skin:    General: Skin is warm and dry.  Neurological:     Mental Status: He is alert and oriented to person, place, and time.  Psychiatric:        Mood and Affect: Mood normal.        Behavior: Behavior normal.      No results found for any visits on 02/19/24.    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  Recommended that he take the glargine insulin at night and use the NovoLog regular preprandially as directed by sliding scale.  Avoid  simple carbohydrates after the evening meal.  Tonna Frederic, MD

## 2024-03-07 ENCOUNTER — Ambulatory Visit: Payer: Medicare Other | Admitting: Neurology

## 2024-03-28 NOTE — Progress Notes (Unsigned)
 Assessment/Plan:   1.  Parkinsons disease, diagnosed September, 2023             -Continue carbidopa /levodopa  25/100, 1.5 tablet 3 times per day at 6am/10-11am/3-4pm.   -discussed exercise and info to community programs discussed  -discussed walker at all times.  Discussed type of walker.  He has never fallen with a walker  - Discussed increasing water intake.  Discussed how to do that.   2.  Alcohol  use             -he is now abstinent from alcohol  as assisted living won't let him drink.  Discussed I am proud of him as he is making better overall choices as well, even in terms of meals   3.  Bilateral ptosis versus pseudoptosis             -Acetylcholine receptor antibodies were negative             -Patient is already seeing both ophthalmology as well as a retina specialist for macular degeneration   4.  Probable diabetic peripheral neuropathy             -Patient sees endocrinology outside of our system.    5.  LBP  -seeing sports med, Dr. Peggy Bowens and Dr. Adonis Alamin  6.  History of hypertension  -On multiple antihypertensives.  Blood pressure was low in the hospital and he was orthostatic in the hospital.  He and I discussed concept of permissive hypertension in Parkinsons disease.  Patient is multiple antihypertensives.  7.  UTI's  - Follows with [facility].  Subjective:   Karl Lawson. was seen today in follow up for Parkinsons disease.  My previous records were reviewed prior to todays visit as well as outside records available to me.  Pt with sister in law who supplements the history.   Patient was in the hospital at the end of January.  He presented with confusion, weakness, dysuria and found to be septic and hyponatremic due to urosepsis.  He also had urinary retention.  Patient was treated with antibiotics.  It appears that they started him on quetiapine  during the hospitalization for some agitation but he is no longer on it now.  He last saw his primary care  physician April 14.  Those notes are reviewed.  Patient was describing some gait instability at that visit and home health PT was ordered.  He reports he is doing PT at assisted living.  He fell to one knee at his grandsons baseball game about a month ago.  He also fell on Friday and went down on that same knee - "I was going from my counter to the kitchen and I just went down.  He doesn't fall with the walker.  I get a pain and just go down from the neuropathy."  No hallucinations.  He asks about his bladder and urinary tract infections.  He does have a urologist.  Current prescribed movement disorder medications: Carbidopa /levodopa  25/100, 1.5 tablets 3 times per day  Prior medications: Hallucinations with Robitussin; quetiapine  (started in the hospital when he had urosepsis)  ALLERGIES:  No Known Allergies  CURRENT MEDICATIONS:  Current Meds  Medication Sig   acetaminophen  (TYLENOL ) 500 MG tablet Take 500 mg by mouth every 6 (six) hours as needed for moderate pain.   albuterol  (VENTOLIN  HFA) 108 (90 Base) MCG/ACT inhaler Inhale 2 puffs into the lungs every 6 (six) hours as needed for wheezing or shortness of breath.  ascorbic Acid (VITAMIN C) 500 MG CPCR Take 500 mg by mouth daily.   aspirin  EC 81 MG tablet Take 81 mg by mouth daily.   atorvastatin  (LIPITOR) 40 MG tablet Take 1 tablet (40 mg total) by mouth daily.   carbidopa -levodopa  (SINEMET  IR) 25-100 MG tablet TAKE 1.5 TABLETS BY MOUTH 3 (THREE) TIMES DAILY. 6AM/10-11AM/3-4PM   cyanocobalamin  1000 MCG tablet Take 1 tablet (1,000 mcg total) by mouth daily.   finasteride  (PROSCAR ) 5 MG tablet Take 1 tablet (5 mg total) by mouth daily.   gabapentin (NEURONTIN) 100 MG capsule Take by mouth.   Glucose 15 g PACK Take 15 g by mouth as needed (hypoglycemia).   insulin  aspart (NOVOLOG ) 100 UNIT/ML injection Inject 0-9 Units into the skin 3 (three) times daily with meals. CBG < 70:treat low blood sugar CBG 70 - 120: 0 units  CBG 121 - 150: 1  unit  CBG 151 - 200: 2 units  CBG 201 - 250: 3 units  CBG 251 - 300: 5 units  CBG 301 - 350: 7 units  CBG 351 - 400: 9 units  CBG > 400: call MD   insulin  glargine-yfgn (SEMGLEE ) 100 UNIT/ML injection Inject 0.2 mLs (20 Units total) into the skin daily.   Metamucil Fiber CHEW Chew 1 each by mouth daily.   metFORMIN  (GLUCOPHAGE ) 500 MG tablet Take 500 mg by mouth 2 (two) times daily with a meal.   Misc. Devices MISC Rollator. G20.A2   omeprazole  (PRILOSEC) 40 MG capsule Take 1 capsule (40 mg total) by mouth daily.   tamsulosin  (FLOMAX ) 0.4 MG CAPS capsule Take 1 capsule (0.4 mg total) by mouth daily after supper.     Objective:   PHYSICAL EXAMINATION:    VITALS:   Vitals:   04/03/24 0955  BP: 118/80  Pulse: 80  SpO2: 98%  Weight: 199 lb 6.4 oz (90.4 kg)    GEN:  The patient appears stated age and is in NAD. HEENT:  Normocephalic, atraumatic.  The mucous membranes are moist. The superficial temporal arteries are without ropiness or tenderness. CV:  rrr Lungs:  CTAB Neck/HEME:  There are no carotid bruits bilaterally.   Neurological examination:   Orientation: The patient is alert and oriented x3.  Cranial nerves: There is good facial symmetry.  There is bilateral ptosis vs pseudoptosis.  There is hypomimia with lips parted at times.  The speech is fluent and clear. Soft palate rises symmetrically and there is no tongue deviation. Hearing is intact to conversational tone. Sensation: Sensation is intact to light touch throughout  Motor: Strength is at least antigravity x 4   Movement examination: Tone: There is mild increased in the RLE Abnormal movements: none Coordination:  There is no decremation with any form of RAMS, including alternating supination and pronation of the forearm, hand opening and closing, finger taps, heel taps.  Mild decremation with toe taps on the L Gait and Station: The patient pushes off to arise.  His L arm is in a sling.  He leans on the examiner.   He has a few stutter steps but does well in hall.   I have reviewed and interpreted the following labs independently    Chemistry      Component Value Date/Time   NA 137 02/19/2024 1454   NA 142 05/17/2022 1354   K 3.9 02/19/2024 1454   CL 102 02/19/2024 1454   CO2 24 02/19/2024 1454   BUN 20 02/19/2024 1454   BUN 10 05/17/2022 1354  CREATININE 0.98 02/19/2024 1454      Component Value Date/Time   CALCIUM  10.2 02/19/2024 1454   ALKPHOS 100 11/24/2023 0837   AST 16 11/24/2023 0837   ALT <5 11/24/2023 0837   BILITOT 1.7 (H) 11/24/2023 0837       Lab Results  Component Value Date   WBC 8.1 12/03/2023   HGB 12.2 (L) 12/03/2023   HCT 36.3 (L) 12/03/2023   MCV 78.9 (L) 12/03/2023   PLT 299 12/03/2023    Lab Results  Component Value Date   TSH 2.134 11/24/2023     Total time spent on today's visit was 32 minutes, including both face-to-face time and nonface-to-face time.  Time included that spent on review of records (prior notes available to me/labs/imaging if pertinent), discussing treatment and goals, answering patient's questions and coordinating care.  Cc:  Tonna Frederic, MD

## 2024-04-03 ENCOUNTER — Ambulatory Visit (INDEPENDENT_AMBULATORY_CARE_PROVIDER_SITE_OTHER): Payer: Medicare Other | Admitting: Neurology

## 2024-04-03 VITALS — BP 118/80 | HR 80 | Wt 199.4 lb

## 2024-04-03 DIAGNOSIS — G20A1 Parkinson's disease without dyskinesia, without mention of fluctuations: Secondary | ICD-10-CM | POA: Diagnosis not present

## 2024-04-03 NOTE — Patient Instructions (Addendum)
 Use the old fashioned walker all the time!  Increase your water intake.    Call me and let me know where to send your medication!

## 2024-04-05 ENCOUNTER — Ambulatory Visit (INDEPENDENT_AMBULATORY_CARE_PROVIDER_SITE_OTHER)

## 2024-04-05 DIAGNOSIS — I4430 Unspecified atrioventricular block: Secondary | ICD-10-CM

## 2024-04-05 LAB — CUP PACEART REMOTE DEVICE CHECK
Battery Remaining Longevity: 143 mo
Battery Voltage: 3.16 V
Brady Statistic AP VP Percent: 12.46 %
Brady Statistic AP VS Percent: 0.03 %
Brady Statistic AS VP Percent: 87.46 %
Brady Statistic AS VS Percent: 0.05 %
Brady Statistic RA Percent Paced: 12.5 %
Brady Statistic RV Percent Paced: 99.92 %
Date Time Interrogation Session: 20250530033521
Implantable Lead Connection Status: 753985
Implantable Lead Connection Status: 753985
Implantable Lead Implant Date: 20241017
Implantable Lead Implant Date: 20241017
Implantable Lead Location: 753859
Implantable Lead Location: 753860
Implantable Lead Model: 3830
Implantable Lead Model: 5076
Implantable Pulse Generator Implant Date: 20241017
Lead Channel Impedance Value: 304 Ohm
Lead Channel Impedance Value: 342 Ohm
Lead Channel Impedance Value: 513 Ohm
Lead Channel Impedance Value: 589 Ohm
Lead Channel Pacing Threshold Amplitude: 0.625 V
Lead Channel Pacing Threshold Amplitude: 1.125 V
Lead Channel Pacing Threshold Pulse Width: 0.4 ms
Lead Channel Pacing Threshold Pulse Width: 0.4 ms
Lead Channel Sensing Intrinsic Amplitude: 19.75 mV
Lead Channel Sensing Intrinsic Amplitude: 19.75 mV
Lead Channel Sensing Intrinsic Amplitude: 6.875 mV
Lead Channel Sensing Intrinsic Amplitude: 6.875 mV
Lead Channel Setting Pacing Amplitude: 1.75 V
Lead Channel Setting Pacing Amplitude: 2 V
Lead Channel Setting Pacing Pulse Width: 0.4 ms
Lead Channel Setting Sensing Sensitivity: 1.2 mV
Zone Setting Status: 755011

## 2024-04-11 ENCOUNTER — Ambulatory Visit: Payer: Self-pay | Admitting: Internal Medicine

## 2024-05-06 ENCOUNTER — Emergency Department (HOSPITAL_COMMUNITY)

## 2024-05-06 ENCOUNTER — Emergency Department (HOSPITAL_COMMUNITY)
Admission: EM | Admit: 2024-05-06 | Discharge: 2024-05-07 | Disposition: A | Discharge status: Skilled Nursing Facility | Attending: Emergency Medicine | Admitting: Emergency Medicine

## 2024-05-06 ENCOUNTER — Other Ambulatory Visit: Payer: Self-pay

## 2024-05-06 ENCOUNTER — Encounter (HOSPITAL_COMMUNITY): Payer: Self-pay

## 2024-05-06 DIAGNOSIS — E119 Type 2 diabetes mellitus without complications: Secondary | ICD-10-CM | POA: Insufficient documentation

## 2024-05-06 DIAGNOSIS — G20A1 Parkinson's disease without dyskinesia, without mention of fluctuations: Secondary | ICD-10-CM | POA: Insufficient documentation

## 2024-05-06 DIAGNOSIS — I10 Essential (primary) hypertension: Secondary | ICD-10-CM | POA: Insufficient documentation

## 2024-05-06 DIAGNOSIS — J449 Chronic obstructive pulmonary disease, unspecified: Secondary | ICD-10-CM | POA: Diagnosis not present

## 2024-05-06 DIAGNOSIS — Z794 Long term (current) use of insulin: Secondary | ICD-10-CM | POA: Diagnosis not present

## 2024-05-06 DIAGNOSIS — R410 Disorientation, unspecified: Secondary | ICD-10-CM | POA: Insufficient documentation

## 2024-05-06 DIAGNOSIS — R441 Visual hallucinations: Secondary | ICD-10-CM | POA: Insufficient documentation

## 2024-05-06 DIAGNOSIS — Z7982 Long term (current) use of aspirin: Secondary | ICD-10-CM | POA: Insufficient documentation

## 2024-05-06 DIAGNOSIS — Z7984 Long term (current) use of oral hypoglycemic drugs: Secondary | ICD-10-CM | POA: Insufficient documentation

## 2024-05-06 LAB — CBC WITH DIFFERENTIAL/PLATELET
Abs Immature Granulocytes: 0.04 10*3/uL (ref 0.00–0.07)
Basophils Absolute: 0 10*3/uL (ref 0.0–0.1)
Basophils Relative: 0 %
Eosinophils Absolute: 0.3 10*3/uL (ref 0.0–0.5)
Eosinophils Relative: 3 %
HCT: 38.9 % — ABNORMAL LOW (ref 39.0–52.0)
Hemoglobin: 12.4 g/dL — ABNORMAL LOW (ref 13.0–17.0)
Immature Granulocytes: 1 %
Lymphocytes Relative: 23 %
Lymphs Abs: 2 10*3/uL (ref 0.7–4.0)
MCH: 25.8 pg — ABNORMAL LOW (ref 26.0–34.0)
MCHC: 31.9 g/dL (ref 30.0–36.0)
MCV: 81 fL (ref 80.0–100.0)
Monocytes Absolute: 1.1 10*3/uL — ABNORMAL HIGH (ref 0.1–1.0)
Monocytes Relative: 13 %
Neutro Abs: 5.1 10*3/uL (ref 1.7–7.7)
Neutrophils Relative %: 60 %
Platelets: 228 10*3/uL (ref 150–400)
RBC: 4.8 MIL/uL (ref 4.22–5.81)
RDW: 15 % (ref 11.5–15.5)
WBC: 8.5 10*3/uL (ref 4.0–10.5)
nRBC: 0 % (ref 0.0–0.2)

## 2024-05-06 LAB — URINALYSIS, ROUTINE W REFLEX MICROSCOPIC
Bacteria, UA: NONE SEEN
Bilirubin Urine: NEGATIVE
Glucose, UA: >=500 mg/dL — AB
Hgb urine dipstick: NEGATIVE
Ketones, ur: 5 mg/dL — AB
Leukocytes,Ua: NEGATIVE
Nitrite: NEGATIVE
Protein, ur: NEGATIVE mg/dL
Specific Gravity, Urine: 1.026 (ref 1.005–1.030)
pH: 6 (ref 5.0–8.0)

## 2024-05-06 LAB — COMPREHENSIVE METABOLIC PANEL WITH GFR
ALT: 6 U/L (ref 0–44)
AST: 12 U/L — ABNORMAL LOW (ref 15–41)
Albumin: 3.5 g/dL (ref 3.5–5.0)
Alkaline Phosphatase: 108 U/L (ref 38–126)
Anion gap: 11 (ref 5–15)
BUN: 21 mg/dL (ref 8–23)
CO2: 22 mmol/L (ref 22–32)
Calcium: 9.8 mg/dL (ref 8.9–10.3)
Chloride: 106 mmol/L (ref 98–111)
Creatinine, Ser: 0.88 mg/dL (ref 0.61–1.24)
GFR, Estimated: >60 mL/min (ref >60)
Glucose, Bld: 214 mg/dL — ABNORMAL HIGH (ref 70–99)
Potassium: 3.7 mmol/L (ref 3.5–5.1)
Sodium: 139 mmol/L (ref 135–145)
Total Bilirubin: 0.7 mg/dL (ref 0.0–1.2)
Total Protein: 6.2 g/dL — ABNORMAL LOW (ref 6.5–8.1)

## 2024-05-06 LAB — I-STAT CG4 LACTIC ACID, ED: Lactic Acid, Venous: 1.4 mmol/L (ref 0.5–1.9)

## 2024-05-06 NOTE — ED Triage Notes (Addendum)
 Pt BIB [facility] from [facility]. Facility reports visual hallucinations and intermittent confusion since the beginning of June. Hx of UTIs. Pt reports BLE weakness, denies pain. A&Ox4 with brief moments of confusion.   EMS  158/70 BP 80 P 95%RA 199 CBG 20 LAC

## 2024-05-06 NOTE — ED Provider Notes (Signed)
 [place] EMERGENCY DEPARTMENT AT [facility] Provider Note   CSN: [id number] Arrival date & time: 05/06/24  1858     Patient presents with: Altered Mental Status   Karl Lawson. is a 78 y.o. male with past medical history of HTN, HLD, GERD, COPD, T2DM, Parkinson's (on Sinemet ), sepsis (11/2023) presents to emergency department via EMS for evaluation of increased confusion and visual hallucinations over the past four weeks.  Son at bedside reports that he has had increased confusion over this past week.  Initially, confusion was worse at night as he was not having consistent sleeping patterns.  They started him on melatonin for this.  Since then, he has had some confusion throughout the day prompting ED evaluation.  Today, he attempted to elope from senior assisted living facility which is not typical for him  Patient has no complaints currently.  But he does endorse that he has had visual hallucinations for the past few weeks.  No auditory hallucinations.  No cough, chest pain, shortness of breath, burning with urination, SI, HI.    Altered Mental Status Presenting symptoms: confusion   Associated symptoms: no abdominal pain, no fever, no headaches, no light-headedness, no nausea, no palpitations, no seizures, no vomiting and no weakness       Prior to Admission medications   Medication Sig Start Date End Date Taking? Authorizing Provider  acetaminophen  (TYLENOL ) 500 MG tablet Take 500 mg by mouth every 6 (six) hours as needed for moderate pain.    [provider]  albuterol  (VENTOLIN  HFA) 108 (90 Base) MCG/ACT inhaler Inhale 2 puffs into the lungs every 6 (six) hours as needed for wheezing or shortness of breath.    [provider]  ascorbic Acid (VITAMIN C) 500 MG CPCR Take 500 mg by mouth daily.    [provider]  aspirin  EC 81 MG tablet Take 81 mg by mouth daily.    [provider]  atorvastatin  (LIPITOR) 40 MG tablet Take 1 tablet  (40 mg total) by mouth daily. 08/26/23   Perri DELENA Meliton Mickey., MD  carbidopa -levodopa  (SINEMET  IR) 25-100 MG tablet TAKE 1.5 TABLETS BY MOUTH 3 (THREE) TIMES DAILY. 6AM/10-11AM/3-4PM 08/16/23   Tat, Asberry RAMAN, DO  cyanocobalamin  1000 MCG tablet Take 1 tablet (1,000 mcg total) by mouth daily. 12/05/23   Adhikari, Amrit, MD  dorzolamide-timolol (COSOPT) 2-0.5 % ophthalmic solution Place 1 drop into the left eye 2 (two) times daily.    [provider]  doxycycline  (VIBRA -TABS) 100 MG tablet Take 1 tablet (100 mg total) by mouth 2 (two) times daily for 10 days. 05/07/24 05/17/24  Berneta Elsie Sayre, MD  finasteride  (PROSCAR ) 5 MG tablet Take 1 tablet (5 mg total) by mouth daily. 05/10/23   Berneta Elsie Sayre, MD  gabapentin (NEURONTIN) 100 MG capsule Take by mouth. 02/17/24   [provider]  Glucose 15 g PACK Take 15 g by mouth as needed (hypoglycemia).    [provider]  insulin  aspart (NOVOLOG ) 100 UNIT/ML injection Inject 0-9 Units into the skin 3 (three) times daily with meals. CBG < 70:treat low blood sugar CBG 70 - 120: 0 units  CBG 121 - 150: 1 unit  CBG 151 - 200: 2 units  CBG 201 - 250: 3 units  CBG 251 - 300: 5 units  CBG 301 - 350: 7 units  CBG 351 - 400: 9 units  CBG > 400: call MD 08/25/23   Perri DELENA Meliton Mickey., MD  insulin   glargine-yfgn (SEMGLEE ) 100 UNIT/ML injection Inject 0.2 mLs (20 Units total) into the skin daily. 12/05/23   Jillian Buttery, MD  JARDIANCE  10 MG TABS tablet Take 10 mg by mouth daily. 04/03/20   [provider]  Metamucil Fiber CHEW Chew 1 each by mouth daily.    [provider]  metFORMIN  (GLUCOPHAGE ) 500 MG tablet Take 500 mg by mouth 2 (two) times daily with a meal.    [provider]  Misc. Devices MISC Rollator. G20.A2 11/16/23   Billy Knee, FNP  omeprazole  (PRILOSEC) 40 MG capsule Take 1 capsule (40 mg total) by mouth daily. 05/10/23   Berneta Elsie Sayre, MD  QUEtiapine  (SEROQUEL ) 25 MG tablet  Take 1 tablet (25 mg total) by mouth at bedtime. 12/04/23   Jillian Buttery, MD  tamsulosin  (FLOMAX ) 0.4 MG CAPS capsule Take 1 capsule (0.4 mg total) by mouth daily after supper. 12/04/23   Adhikari, Amrit, MD  tobramycin (TOBREX) 0.3 % ophthalmic solution Place 1 drop into the left eye daily as needed (to prevent infection when getting injection).    [provider]    Allergies: Patient has no known allergies.    Review of Systems  Constitutional:  Negative for chills, fatigue and fever.  Respiratory:  Negative for cough, chest tightness, shortness of breath and wheezing.   Cardiovascular:  Negative for chest pain and palpitations.  Gastrointestinal:  Negative for abdominal pain, constipation, diarrhea, nausea and vomiting.  Neurological:  Negative for dizziness, seizures, weakness, light-headedness, numbness and headaches.  Psychiatric/Behavioral:  Positive for behavioral problems and confusion.     Updated Vital Signs BP 135/78   Pulse 60   Temp 98 F (36.7 C)   Resp 14   Ht 5' 8 (1.727 m)   Wt 90.3 kg   SpO2 97%   BMI 30.26 kg/m   Physical Exam Vitals and nursing note reviewed.  Constitutional:      General: He is not in acute distress.    Appearance: Normal appearance.  HENT:     Head: Normocephalic and atraumatic.  Eyes:     General: Lids are normal. Vision grossly intact. No visual field deficit.    Extraocular Movements:     Right eye: Normal extraocular motion and no nystagmus.     Left eye: Normal extraocular motion and no nystagmus.     Conjunctiva/sclera: Conjunctivae normal.  Cardiovascular:     Rate and Rhythm: Normal rate.  Pulmonary:     Effort: Pulmonary effort is normal. No respiratory distress.  Musculoskeletal:     Right lower leg: No edema.     Left lower leg: No edema.  Skin:    General: Skin is warm.     Capillary Refill: Capillary refill takes less than 2 seconds.     Coloration: Skin is not jaundiced or pale.  Neurological:      Mental Status: He is alert and oriented to person, place, and time. Mental status is at baseline.     GCS: GCS eye subscore is 4. GCS verbal subscore is 5. GCS motor subscore is 6.     Cranial Nerves: No cranial nerve deficit, dysarthria or facial asymmetry.     Sensory: No sensory deficit.     Motor: No weakness, tremor, abnormal muscle tone, seizure activity or pronator drift.     Coordination: Coordination normal. Finger-Nose-Finger Test and Heel to [facility] Test normal.     Comments: Grip strength equal bilaterally.  Motor 5/5 of BUE and BLE.  Sensation 2/2  of BUE and RLE.  Has sensation 1/2 of LLE per baseline over past couple of months 2/2 neuropathy     (all labs ordered are listed, but only abnormal results are displayed) Labs Reviewed  COMPREHENSIVE METABOLIC PANEL WITH GFR - Abnormal; Notable for the following components:      Result Value   Glucose, Bld 214 (*)    Total Protein 6.2 (*)    AST 12 (*)    All other components within normal limits  CBC WITH DIFFERENTIAL/PLATELET - Abnormal; Notable for the following components:   Hemoglobin 12.4 (*)    HCT 38.9 (*)    MCH 25.8 (*)    Monocytes Absolute 1.1 (*)    All other components within normal limits  URINALYSIS, ROUTINE W REFLEX MICROSCOPIC - Abnormal; Notable for the following components:   Glucose, UA >=500 (*)    Ketones, ur 5 (*)    All other components within normal limits  I-STAT CG4 LACTIC ACID, ED    EKG: EKG Interpretation Date/Time:  Monday May 06 2024 23:10:38 EDT Ventricular Rate:  63 PR Interval:  203 QRS Duration:  123 QT Interval:  416 QTC Calculation: 426 R Axis:   -33  Text Interpretation: Sinus rhythm IVCD, consider atypical RBBB Confirmed by Midge Golas ([id number]) on 05/06/2024 11:40:20 PM  Radiology: CT Head Wo Contrast Result Date: 05/06/2024 CLINICAL DATA:  Mental status change EXAM: CT HEAD WITHOUT CONTRAST TECHNIQUE: Contiguous axial images were obtained from the base of the skull through  the vertex without intravenous contrast. RADIATION DOSE REDUCTION: This exam was performed according to the departmental dose-optimization program which includes automated exposure control, adjustment of the mA and/or kV according to patient size and/or use of iterative reconstruction technique. COMPARISON:  None Available. FINDINGS: Brain: No acute intracranial hemorrhage. No focal mass lesion. No CT evidence of acute infarction. No midline shift or mass effect. No hydrocephalus. Basilar cisterns are patent. There are periventricular and subcortical white matter hypodensities. Generalized cortical atrophy. Vascular: No hyperdense vessel or unexpected calcification. Skull: Normal. Negative for fracture or focal lesion. Sinuses/Orbits: Paranasal sinuses and mastoid air cells are clear. Orbits are clear. Other: None. IMPRESSION: 1. No acute intracranial findings. 2. Atrophy and white matter microvascular disease. Electronically Signed   By: Jackquline Boxer M.D.   On: 05/06/2024 20:42      Medications Ordered in the ED - No data to display                                  Medical Decision Making Amount and/or Complexity of Data Reviewed Labs: ordered. Radiology: ordered.   Patient presents to the ED for concern of confusion, visual hallucinations, this involves an extensive number of treatment options, and is a complaint that carries with it a high risk of complications and morbidity.  The differential diagnosis includes ICH, CVA/TIA, electrolyte abnormality, infection, sepsis, dementia, hypoglycemia, hyperglycemia   Co morbidities that complicate the patient evaluation  HPI   Additional history obtained:  Additional history obtained from EMS, Family, and Nursing   External records from outside source obtained and reviewed including triage RN note, son at bedside, EMS   Lab Tests:  I Ordered, and personally interpreted labs.  The pertinent results include:   Lactic WNL Hgb 12.4 CBG  214 UA noninfectious   Imaging Studies ordered:  I ordered imaging studies including CT head wo con  I independently visualized and interpreted imaging  which showed   No acute intracranial findings. Atrophy and white matter microvascular disease. I agree with the radiologist interpretation   Cardiac Monitoring:  The patient was maintained on a cardiac monitor.  I personally viewed and interpreted the cardiac monitored which showed an underlying rhythm of: NSR    Consultations Obtained:  I requested consultation with Neurology Dr. Sal Khaliqdina,  and discussed lab and imaging findings as well as pertinent plan - they recommend:  reassured with no focal deficits, confusion while in ED. He reviewed previous admission note, recent outpatient neurology with Dr. Evonnie.  He does not feel that further workup, imaging is necessary from neuro standpoint. He recommends patient return to Dr. Evonnie to discuss medication options for behavioral disruptions   Problem List / ED Course:  Confusion Son reports patient acts like this when he has sepsis and previously had sepsis from UTI on 11/2023 Per admission note has been having increased confusion and hallucinations at that time as well and could be related to Parkinson's vs UTI Hospital course notable for intermittent agitation per note on 12/04/23 - was started on seroquel  during admission but is no longer on it A&Ox3 while in ED Unsure if patient has dementia at baseline.  Paperwork from nursing facility notes that he has unspecified moderate dementia.  Son and patient do not know of this diagnosis however Patient reports at high doses of gabapentin it increases hallucinations so recently decreased it Visual hallucinations None currently No SI, HI, thoughts of self injury   Reevaluation:  After the interventions noted above, I reevaluated the patient and found that they have :stayed the same     Dispostion:  After consideration of  the diagnostic results and the patients response to treatment, I feel that the patent would benefit from outpatient management with neurology.  Discussed ED workup, disposition, return to ED precautions with patient, son at bedside who expresses understanding agrees with plan.  All questions answered to their satisfaction.  They are agreeable to plan.  Discharge instructions provided on paperwork  Final diagnoses:  Confusion  Visual hallucinations    ED Discharge Orders     None          Minnie Tinnie BRAVO, PA 05/08/24 1530    Pamella Sharper A, DO 05/11/24 [phone]

## 2024-05-07 ENCOUNTER — Ambulatory Visit (INDEPENDENT_AMBULATORY_CARE_PROVIDER_SITE_OTHER): Admitting: Family Medicine

## 2024-05-07 ENCOUNTER — Other Ambulatory Visit: Payer: Self-pay

## 2024-05-07 ENCOUNTER — Telehealth: Payer: Self-pay | Admitting: Neurology

## 2024-05-07 ENCOUNTER — Encounter: Payer: Self-pay | Admitting: Family Medicine

## 2024-05-07 VITALS — BP 135/62 | HR 62 | Temp 97.5°F | Ht 68.0 in | Wt 196.8 lb

## 2024-05-07 DIAGNOSIS — L089 Local infection of the skin and subcutaneous tissue, unspecified: Secondary | ICD-10-CM | POA: Insufficient documentation

## 2024-05-07 DIAGNOSIS — Z860101 Personal history of adenomatous and serrated colon polyps: Secondary | ICD-10-CM

## 2024-05-07 DIAGNOSIS — Z09 Encounter for follow-up examination after completed treatment for conditions other than malignant neoplasm: Secondary | ICD-10-CM | POA: Diagnosis not present

## 2024-05-07 DIAGNOSIS — Z23 Encounter for immunization: Secondary | ICD-10-CM

## 2024-05-07 MED ORDER — DOXYCYCLINE HYCLATE 100 MG PO TABS: 100.0000 mg | ORAL_TABLET | Freq: Two times a day (BID) | ORAL | 0 refills | Status: DC

## 2024-05-07 MED ORDER — DOXYCYCLINE HYCLATE 100 MG PO TABS: 100.0000 mg | ORAL_TABLET | Freq: Two times a day (BID) | ORAL | 0 refills | Status: AC

## 2024-05-07 NOTE — ED Notes (Signed)
 [facility] called

## 2024-05-07 NOTE — Progress Notes (Signed)
 Established Patient Office Visit   Subjective:  Patient ID: Karl Lawson., male    DOB: [birth year]  Age: 78 y.o. MRN: [id number]  Chief Complaint  Patient presents with   Follow-up   Diabetes    Fasting, having trouble adjusting number seen in ER yesterday for confusing     Diabetes Pertinent negatives for diabetes include no blurred vision, no polydipsia and no weakness.   Encounter Diagnoses  Name Primary?   Hospital discharge follow-up Yes   Pustule    History of adenomatous polyps of colon - probable attenuated polyposis    Immunization due    For hospital discharge follow-up accompanied by his sister-in-law Jan.  He was seen for confusion.  ER doctors note was not available.  Reviewed the lab work and the CT scan of the head.  Everything looked okay.  Glucose was 214.  Assisted living personnel are helping with insulin  administration.  He has follow-up appointments with endocrinology this afternoon.  Brings in his glucometer that showed an average blood sugar 167.  Just had follow-up with Dr. Evonnie for Parkinson's.   Review of Systems  Constitutional: Negative.   HENT: Negative.    Eyes:  Negative for blurred vision, discharge and redness.  Respiratory: Negative.    Cardiovascular: Negative.   Gastrointestinal:  Negative for abdominal pain.  Genitourinary: Negative.   Musculoskeletal: Negative.  Negative for myalgias.  Skin:  Negative for rash.  Neurological:  Negative for tingling, loss of consciousness and weakness.  Endo/Heme/Allergies:  Negative for polydipsia.     Current Outpatient Medications:    acetaminophen  (TYLENOL ) 500 MG tablet, Take 500 mg by mouth every 6 (six) hours as needed for moderate pain., Disp: , Rfl:    albuterol  (VENTOLIN  HFA) 108 (90 Base) MCG/ACT inhaler, Inhale 2 puffs into the lungs every 6 (six) hours as needed for wheezing or shortness of breath., Disp: , Rfl:    ascorbic Acid (VITAMIN C) 500 MG CPCR, Take 500 mg by mouth daily.,  Disp: , Rfl:    aspirin  EC 81 MG tablet, Take 81 mg by mouth daily., Disp: , Rfl:    atorvastatin  (LIPITOR) 40 MG tablet, Take 1 tablet (40 mg total) by mouth daily., Disp: , Rfl:    carbidopa -levodopa  (SINEMET  IR) 25-100 MG tablet, TAKE 1.5 TABLETS BY MOUTH 3 (THREE) TIMES DAILY. 6AM/10-11AM/3-4PM, Disp: 405 tablet, Rfl: 0   cyanocobalamin  1000 MCG tablet, Take 1 tablet (1,000 mcg total) by mouth daily., Disp: , Rfl:    dorzolamide-timolol (COSOPT) 2-0.5 % ophthalmic solution, Place 1 drop into the left eye 2 (two) times daily., Disp: , Rfl:    doxycycline  (VIBRA -TABS) 100 MG tablet, Take 1 tablet (100 mg total) by mouth 2 (two) times daily for 10 days., Disp: 20 tablet, Rfl: 0   finasteride  (PROSCAR ) 5 MG tablet, Take 1 tablet (5 mg total) by mouth daily., Disp: 90 tablet, Rfl: 3   gabapentin (NEURONTIN) 100 MG capsule, Take by mouth., Disp: , Rfl:    insulin  aspart (NOVOLOG ) 100 UNIT/ML injection, Inject 0-9 Units into the skin 3 (three) times daily with meals. CBG < 70:treat low blood sugar CBG 70 - 120: 0 units  CBG 121 - 150: 1 unit  CBG 151 - 200: 2 units  CBG 201 - 250: 3 units  CBG 251 - 300: 5 units  CBG 301 - 350: 7 units  CBG 351 - 400: 9 units  CBG > 400: call MD, Disp: , Rfl:    insulin   glargine-yfgn (SEMGLEE ) 100 UNIT/ML injection, Inject 0.2 mLs (20 Units total) into the skin daily., Disp: , Rfl:    JARDIANCE  10 MG TABS tablet, Take 10 mg by mouth daily., Disp: , Rfl:    Metamucil Fiber CHEW, Chew 1 each by mouth daily., Disp: , Rfl:    metFORMIN  (GLUCOPHAGE ) 500 MG tablet, Take 500 mg by mouth 2 (two) times daily with a meal., Disp: , Rfl:    Misc. Devices MISC, Rollator. G20.A2, Disp: 1 each, Rfl: 0   omeprazole  (PRILOSEC) 40 MG capsule, Take 1 capsule (40 mg total) by mouth daily., Disp: 90 capsule, Rfl: 3   tamsulosin  (FLOMAX ) 0.4 MG CAPS capsule, Take 1 capsule (0.4 mg total) by mouth daily after supper., Disp: , Rfl:    tobramycin (TOBREX) 0.3 % ophthalmic solution, Place 1  drop into the left eye daily as needed (to prevent infection when getting injection)., Disp: , Rfl:    Glucose 15 g PACK, Take 15 g by mouth as needed (hypoglycemia)., Disp: , Rfl:    QUEtiapine  (SEROQUEL ) 25 MG tablet, Take 1 tablet (25 mg total) by mouth at bedtime., Disp: , Rfl:    Objective:     BP 135/62 (BP Location: Right Arm, Patient Position: Sitting)   Pulse 62   Temp (!) 97.5 F (36.4 C) (Temporal)   Ht 5' 8 (1.727 m)   Wt 196 lb 12.8 oz (89.3 kg)   SpO2 95%   BMI 29.92 kg/m    Physical Exam Constitutional:      General: He is not in acute distress.    Appearance: Normal appearance. He is not ill-appearing, toxic-appearing or diaphoretic.  HENT:     Head: Normocephalic and atraumatic.     Right Ear: External ear normal.     Left Ear: External ear normal.     Mouth/Throat:     Mouth: Mucous membranes are moist.     Pharynx: Oropharynx is clear. No oropharyngeal exudate or posterior oropharyngeal erythema.   Eyes:     General: No scleral icterus.       Right eye: No discharge.        Left eye: No discharge.     Extraocular Movements: Extraocular movements intact.     Conjunctiva/sclera: Conjunctivae normal.     Pupils: Pupils are equal, round, and reactive to light.    Cardiovascular:     Rate and Rhythm: Normal rate and regular rhythm.     Pulses:          Dorsalis pedis pulses are 1+ on the right side and 1+ on the left side.       Posterior tibial pulses are 1+ on the right side and 1+ on the left side.  Pulmonary:     Effort: Pulmonary effort is normal. No respiratory distress.     Breath sounds: Normal breath sounds. No wheezing or rales.  Abdominal:     General: Bowel sounds are normal.     Tenderness: There is no abdominal tenderness. There is no guarding.   Musculoskeletal:     Cervical back: No rigidity or tenderness.   Skin:    General: Skin is warm and dry.       Neurological:     Mental Status: He is alert and oriented to person, place,  and time.     Comments: Cognitive slowing.  Psychiatric:        Mood and Affect: Mood normal.        Behavior: Behavior normal.  Results for orders placed or performed during the hospital encounter of 05/06/24  Comprehensive metabolic panel  Result Value Ref Range   Sodium 139 135 - 145 mmol/L   Potassium 3.7 3.5 - 5.1 mmol/L   Chloride 106 98 - 111 mmol/L   CO2 22 22 - 32 mmol/L   Glucose, Bld 214 (H) 70 - 99 mg/dL   BUN 21 8 - 23 mg/dL   Creatinine, Ser 9.11 0.61 - 1.24 mg/dL   Calcium  9.8 8.9 - 10.3 mg/dL   Total Protein 6.2 (L) 6.5 - 8.1 g/dL   Albumin 3.5 3.5 - 5.0 g/dL   AST 12 (L) 15 - 41 U/L   ALT 6 0 - 44 U/L   Alkaline Phosphatase 108 38 - 126 U/L   Total Bilirubin 0.7 0.0 - 1.2 mg/dL   GFR, Estimated >39 >39 mL/min   Anion gap 11 5 - 15  CBC with Differential  Result Value Ref Range   WBC 8.5 4.0 - 10.5 K/uL   RBC 4.80 4.22 - 5.81 MIL/uL   Hemoglobin 12.4 (L) 13.0 - 17.0 g/dL   HCT 61.0 (L) 60.9 - 47.9 %   MCV 81.0 80.0 - 100.0 fL   MCH 25.8 (L) 26.0 - 34.0 pg   MCHC 31.9 30.0 - 36.0 g/dL   RDW 84.9 88.4 - 84.4 %   Platelets 228 150 - 400 K/uL   nRBC 0.0 0.0 - 0.2 %   Neutrophils Relative % 60 %   Neutro Abs 5.1 1.7 - 7.7 K/uL   Lymphocytes Relative 23 %   Lymphs Abs 2.0 0.7 - 4.0 K/uL   Monocytes Relative 13 %   Monocytes Absolute 1.1 (H) 0.1 - 1.0 K/uL   Eosinophils Relative 3 %   Eosinophils Absolute 0.3 0.0 - 0.5 K/uL   Basophils Relative 0 %   Basophils Absolute 0.0 0.0 - 0.1 K/uL   Immature Granulocytes 1 %   Abs Immature Granulocytes 0.04 0.00 - 0.07 K/uL  Urinalysis, Routine w reflex microscopic -Urine, Catheterized  Result Value Ref Range   Color, Urine YELLOW YELLOW   APPearance CLEAR CLEAR   Specific Gravity, Urine 1.026 1.005 - 1.030   pH 6.0 5.0 - 8.0   Glucose, UA >=500 (A) NEGATIVE mg/dL   Hgb urine dipstick NEGATIVE NEGATIVE   Bilirubin Urine NEGATIVE NEGATIVE   Ketones, ur 5 (A) NEGATIVE mg/dL   Protein, ur NEGATIVE  NEGATIVE mg/dL   Nitrite NEGATIVE NEGATIVE   Leukocytes,Ua NEGATIVE NEGATIVE   RBC / HPF 0-5 0 - 5 RBC/hpf   WBC, UA 0-5 0 - 5 WBC/hpf   Bacteria, UA NONE SEEN NONE SEEN   Squamous Epithelial / HPF 0-5 0 - 5 /HPF  I-Stat CG4 Lactic Acid  Result Value Ref Range   Lactic Acid, Venous 1.4 0.5 - 1.9 mmol/L      The ASCVD Risk score (Arnett DK, et al., 2019) failed to calculate for the following reasons:   The valid total cholesterol range is 130 to 320 mg/dL    Assessment & Plan:   Hospital discharge follow-up  Pustule -     Doxycycline  Hyclate; Take 1 tablet (100 mg total) by mouth 2 (two) times daily for 10 days.  Dispense: 20 tablet; Refill: 0  History of adenomatous polyps of colon - probable attenuated polyposis -     Ambulatory referral to Gastroenterology  Immunization due -     Pneumococcal conjugate vaccine 20-valent    Return in about 3 months (  around 08/07/2024).  Suspect that confusion and cognitive slowing could be associated with Parkinson's.  They will schedule follow-up with neurology.  Continue follow-up with endocrinology.  Happy to hear that he is not responsible for administration of his insulin .  Elsie Sim Lent, MD

## 2024-05-07 NOTE — Telephone Encounter (Signed)
 Copied from CRM [id number]. Topic: Clinical - Prescription Issue >> May 07, 2024  3:44 PM Karl Lawson wrote: Reason for CRM: pt called and stated that the prescription that was sent in today is the incoorrect pharmacy. Before I could get the info, the call disconnected. Please call and advise.

## 2024-05-07 NOTE — Discharge Instructions (Addendum)
 Thank for let us  evaluate you today.  We did not see any signs of infection.  No electrolyte abnormalities.  Your CT did not show any bleeding.  Please follow-up with neurology for further management of behavioral disturbances  Return to Emergency Department if you experience chest pain, shortness of breath, altered mentation, seizures, weakness or numbness in one-sided body

## 2024-05-07 NOTE — Telephone Encounter (Signed)
 From PCP notes that the patient saw today  Return in about 3 months (around 08/07/2024).  Suspect that confusion and cognitive slowing could be associated with Parkinson's.  They will schedule follow-up with neurology.  Continue follow-up with endocrinology.  Happy to hear that he is not responsible for administration of his insulin .   Elsie Sim Lent, MD There is a appointment available for this patient if you want to see him on July 16 at 1:00 pm

## 2024-05-07 NOTE — Telephone Encounter (Signed)
 Pt son called and states that patient went to the ED yesterday and the Dr today. The ED did some blood work there. Patient is seeing things and very confused  please call

## 2024-05-13 ENCOUNTER — Emergency Department (HOSPITAL_BASED_OUTPATIENT_CLINIC_OR_DEPARTMENT_OTHER)

## 2024-05-13 ENCOUNTER — Encounter (HOSPITAL_BASED_OUTPATIENT_CLINIC_OR_DEPARTMENT_OTHER): Payer: Self-pay

## 2024-05-13 ENCOUNTER — Emergency Department (HOSPITAL_BASED_OUTPATIENT_CLINIC_OR_DEPARTMENT_OTHER)
Admission: EM | Admit: 2024-05-13 | Discharge: 2024-05-13 | Disposition: A | Discharge status: Home / Self Care | Attending: Student | Admitting: Student

## 2024-05-13 DIAGNOSIS — W19XXXA Unspecified fall, initial encounter: Secondary | ICD-10-CM

## 2024-05-13 DIAGNOSIS — Z7982 Long term (current) use of aspirin: Secondary | ICD-10-CM | POA: Insufficient documentation

## 2024-05-13 DIAGNOSIS — G20A1 Parkinson's disease without dyskinesia, without mention of fluctuations: Secondary | ICD-10-CM | POA: Diagnosis not present

## 2024-05-13 DIAGNOSIS — W01198A Fall on same level from slipping, tripping and stumbling with subsequent striking against other object, initial encounter: Secondary | ICD-10-CM | POA: Diagnosis not present

## 2024-05-13 DIAGNOSIS — Z794 Long term (current) use of insulin: Secondary | ICD-10-CM | POA: Diagnosis not present

## 2024-05-13 DIAGNOSIS — S0990XA Unspecified injury of head, initial encounter: Secondary | ICD-10-CM | POA: Diagnosis present

## 2024-05-13 DIAGNOSIS — Z7984 Long term (current) use of oral hypoglycemic drugs: Secondary | ICD-10-CM | POA: Diagnosis not present

## 2024-05-13 DIAGNOSIS — S0101XA Laceration without foreign body of scalp, initial encounter: Secondary | ICD-10-CM | POA: Diagnosis not present

## 2024-05-13 DIAGNOSIS — H1131 Conjunctival hemorrhage, right eye: Secondary | ICD-10-CM | POA: Insufficient documentation

## 2024-05-13 MED ORDER — ACETAMINOPHEN 325 MG PO TABS: 650.0000 mg | ORAL_TABLET | Freq: Once | ORAL | Status: DC

## 2024-05-13 MED ORDER — LIDOCAINE-EPINEPHRINE (PF) 2 %-1:200000 IJ SOLN
10.0000 mL | Freq: Once | INTRAMUSCULAR | Status: AC
  Administered 2024-05-13: 10 mL
  Filled 2024-05-13: qty 20

## 2024-05-13 MED ORDER — LIDOCAINE-EPINEPHRINE-TETRACAINE (LET) TOPICAL GEL
3.0000 mL | Freq: Once | TOPICAL | Status: AC
  Administered 2024-05-13: 3 mL via TOPICAL
  Filled 2024-05-13: qty 3

## 2024-05-13 NOTE — ED Notes (Signed)
 Patient transported to CT

## 2024-05-13 NOTE — ED Triage Notes (Signed)
 Pt BIB [facility] from [facility] for a fall resulting in ~2in lac to top of head, bleeding controlled with bandage. Pt reports he can't remember why he fell, may have been dizzy at the time, reports hx of 'dizzy spells for awhile'. No blood thinners, no LOC per EMS. Pt CA&Ox4 upon arrival  EMS vitals:  158/86 P 99 RR 18 SpO2 94% CBG 152

## 2024-05-13 NOTE — ED Provider Notes (Signed)
 [place] EMERGENCY DEPARTMENT AT [facility] Provider Note   CSN: [id number] Arrival date & time: 05/13/24  2018     Patient presents with: Fall and Head Laceration   Karl Lawson. is a 78 y.o. male.   78 year old male brought in by EMS from Senior living facility for a fall.  Patient states that he was bending over to pick the remote control up off the floor when he fell and hit the top of his head on the ground.  No loss of consciousness.  Son at bedside assists with history, patient with history of Parkinson's, difficult historian.  Patient is not anticoagulated.       Prior to Admission medications   Medication Sig Start Date End Date Taking? Authorizing Provider  acetaminophen  (TYLENOL ) 500 MG tablet Take 500 mg by mouth every 6 (six) hours as needed for moderate pain.    [provider]  albuterol  (VENTOLIN  HFA) 108 (90 Base) MCG/ACT inhaler Inhale 2 puffs into the lungs every 6 (six) hours as needed for wheezing or shortness of breath.    [provider]  ascorbic Acid (VITAMIN C) 500 MG CPCR Take 500 mg by mouth daily.    [provider]  aspirin  EC 81 MG tablet Take 81 mg by mouth daily.    [provider]  atorvastatin  (LIPITOR) 40 MG tablet Take 1 tablet (40 mg total) by mouth daily. 08/26/23   Perri DELENA Meliton Mickey., MD  carbidopa -levodopa  (SINEMET  IR) 25-100 MG tablet TAKE 1.5 TABLETS BY MOUTH 3 (THREE) TIMES DAILY. 6AM/10-11AM/3-4PM 08/16/23   Tat, Asberry RAMAN, DO  cyanocobalamin  1000 MCG tablet Take 1 tablet (1,000 mcg total) by mouth daily. 12/05/23   Adhikari, Amrit, MD  dorzolamide-timolol (COSOPT) 2-0.5 % ophthalmic solution Place 1 drop into the left eye 2 (two) times daily.    [provider]  doxycycline  (VIBRA -TABS) 100 MG tablet Take 1 tablet (100 mg total) by mouth 2 (two) times daily for 10 days. 05/07/24 05/17/24  Berneta Elsie Sayre, MD  finasteride  (PROSCAR ) 5 MG tablet Take 1 tablet (5 mg total) by mouth  daily. 05/10/23   Berneta Elsie Sayre, MD  gabapentin (NEURONTIN) 100 MG capsule Take by mouth. 02/17/24   [provider]  Glucose 15 g PACK Take 15 g by mouth as needed (hypoglycemia).    [provider]  insulin  aspart (NOVOLOG ) 100 UNIT/ML injection Inject 0-9 Units into the skin 3 (three) times daily with meals. CBG < 70:treat low blood sugar CBG 70 - 120: 0 units  CBG 121 - 150: 1 unit  CBG 151 - 200: 2 units  CBG 201 - 250: 3 units  CBG 251 - 300: 5 units  CBG 301 - 350: 7 units  CBG 351 - 400: 9 units  CBG > 400: call MD 08/25/23   Perri DELENA Meliton Mickey., MD  insulin  glargine-yfgn (SEMGLEE ) 100 UNIT/ML injection Inject 0.2 mLs (20 Units total) into the skin daily. 12/05/23   Jillian Buttery, MD  JARDIANCE  10 MG TABS tablet Take 10 mg by mouth daily. 04/03/20   [provider]  Metamucil Fiber CHEW Chew 1 each by mouth daily.    [provider]  metFORMIN  (GLUCOPHAGE ) 500 MG tablet Take 500 mg by mouth 2 (two) times daily with a meal.    [provider]  Misc. Devices MISC Rollator. G20.A2 11/16/23   Billy Knee, FNP  omeprazole  (PRILOSEC) 40 MG capsule Take 1 capsule (40 mg total) by mouth daily.  05/10/23   Berneta Elsie Sayre, MD  QUEtiapine  (SEROQUEL ) 25 MG tablet Take 1 tablet (25 mg total) by mouth at bedtime. 12/04/23   Jillian Buttery, MD  tamsulosin  (FLOMAX ) 0.4 MG CAPS capsule Take 1 capsule (0.4 mg total) by mouth daily after supper. 12/04/23   Adhikari, Amrit, MD  tobramycin (TOBREX) 0.3 % ophthalmic solution Place 1 drop into the left eye daily as needed (to prevent infection when getting injection).    [provider]    Allergies: Patient has no known allergies.    Review of Systems Negative except as per HPI Updated Vital Signs BP 138/78   Pulse 94   Temp 98.3 F (36.8 C) (Oral)   Resp (!) 21   Ht 5' 8 (1.727 m)   SpO2 95%   BMI 29.92 kg/m   Physical Exam Vitals and nursing note reviewed.   Constitutional:      General: He is not in acute distress.    Appearance: He is well-developed. He is not diaphoretic.  HENT:     Head: Normocephalic.      Comments: Approx 6cm laceration to crown of head     Nose: Nose normal.     Mouth/Throat:     Mouth: Mucous membranes are moist.  Eyes:     Extraocular Movements: Extraocular movements intact.     Pupils: Pupils are equal, round, and reactive to light.     Comments: Small right subconjunctival hemorrhage at the 12 o'clock position.  Cardiovascular:     Rate and Rhythm: Normal rate and regular rhythm.     Heart sounds: Normal heart sounds.  Pulmonary:     Effort: Pulmonary effort is normal.     Breath sounds: Normal breath sounds.  Abdominal:     Palpations: Abdomen is soft.     Tenderness: There is no abdominal tenderness.  Musculoskeletal:        General: No swelling, tenderness, deformity or signs of injury. Normal range of motion.     Cervical back: Neck supple. No tenderness or bony tenderness.     Thoracic back: No tenderness or bony tenderness.     Lumbar back: No tenderness or bony tenderness.     Right lower leg: No edema.     Left lower leg: No edema.  Skin:    General: Skin is warm and dry.     Findings: No erythema or rash.  Neurological:     Mental Status: He is alert and oriented to person, place, and time.  Psychiatric:        Behavior: Behavior normal.     (all labs ordered are listed, but only abnormal results are displayed) Labs Reviewed - No data to display  EKG: None  Radiology: CT HEAD WO CONTRAST ( ) Result Date: 05/13/2024 CLINICAL DATA:  Head trauma, minor (Age >= 65y); Neck trauma (Age >= 65y) EXAM: CT HEAD WITHOUT CONTRAST CT CERVICAL SPINE WITHOUT CONTRAST TECHNIQUE: Multidetector CT imaging of the head and cervical spine was performed following the standard protocol without intravenous contrast. Multiplanar CT image reconstructions of the cervical spine were also generated. RADIATION  DOSE REDUCTION: This exam was performed according to the departmental dose-optimization program which includes automated exposure control, adjustment of the mA and/or kV according to patient size and/or use of iterative reconstruction technique. COMPARISON:  CT head and C-spine 11/24/2023 FINDINGS: CT HEAD FINDINGS Brain: Cerebral ventricle sizes are concordant with the degree of cerebral volume loss. Patchy and confluent areas of decreased attenuation are noted  throughout the deep and periventricular white matter of the cerebral hemispheres bilaterally, compatible with chronic microvascular ischemic disease. No evidence of large-territorial acute infarction. No parenchymal hemorrhage. No mass lesion. No extra-axial collection. No mass effect or midline shift. No hydrocephalus. Basilar cisterns are patent. Vascular: No hyperdense vessel. Atherosclerotic calcifications are present within the cavernous internal carotid and vertebral arteries. Skull: No acute fracture or focal lesion. Sinuses/Orbits: Paranasal sinuses and mastoid air cells are clear. Bilateral lens replacement. Otherwise the orbits are unremarkable. Other: None. CT CERVICAL SPINE FINDINGS Alignment: Normal. Skull base and vertebrae: Moderate severe C6-C7 degenerative changes. No associated severe osseous neural foraminal or central canal stenosis. No acute fracture. No aggressive appearing focal osseous lesion or focal pathologic process. Soft tissues and spinal canal: No prevertebral fluid or swelling. No visible canal hematoma. Upper chest: Moderate centrilobular and paraseptal emphysematous changes. Other: None. IMPRESSION: 1. No acute intracranial abnormality. 2. No acute displaced fracture or traumatic listhesis of the cervical spine. 3.  Emphysema (ICD10-J43.9). Electronically Signed   By: Morgane  Naveau M.D.   On: 05/13/2024 21:02   CT CERVICAL SPINE WO CONTRAST Result Date: 05/13/2024 CLINICAL DATA:  Head trauma, minor (Age >= 65y); Neck  trauma (Age >= 65y) EXAM: CT HEAD WITHOUT CONTRAST CT CERVICAL SPINE WITHOUT CONTRAST TECHNIQUE: Multidetector CT imaging of the head and cervical spine was performed following the standard protocol without intravenous contrast. Multiplanar CT image reconstructions of the cervical spine were also generated. RADIATION DOSE REDUCTION: This exam was performed according to the departmental dose-optimization program which includes automated exposure control, adjustment of the mA and/or kV according to patient size and/or use of iterative reconstruction technique. COMPARISON:  CT head and C-spine 11/24/2023 FINDINGS: CT HEAD FINDINGS Brain: Cerebral ventricle sizes are concordant with the degree of cerebral volume loss. Patchy and confluent areas of decreased attenuation are noted throughout the deep and periventricular white matter of the cerebral hemispheres bilaterally, compatible with chronic microvascular ischemic disease. No evidence of large-territorial acute infarction. No parenchymal hemorrhage. No mass lesion. No extra-axial collection. No mass effect or midline shift. No hydrocephalus. Basilar cisterns are patent. Vascular: No hyperdense vessel. Atherosclerotic calcifications are present within the cavernous internal carotid and vertebral arteries. Skull: No acute fracture or focal lesion. Sinuses/Orbits: Paranasal sinuses and mastoid air cells are clear. Bilateral lens replacement. Otherwise the orbits are unremarkable. Other: None. CT CERVICAL SPINE FINDINGS Alignment: Normal. Skull base and vertebrae: Moderate severe C6-C7 degenerative changes. No associated severe osseous neural foraminal or central canal stenosis. No acute fracture. No aggressive appearing focal osseous lesion or focal pathologic process. Soft tissues and spinal canal: No prevertebral fluid or swelling. No visible canal hematoma. Upper chest: Moderate centrilobular and paraseptal emphysematous changes. Other: None. IMPRESSION: 1. No acute  intracranial abnormality. 2. No acute displaced fracture or traumatic listhesis of the cervical spine. 3.  Emphysema (ICD10-J43.9). Electronically Signed   By: Morgane  Naveau M.D.   On: 05/13/2024 21:02     .Laceration Repair  Date/Time: 05/13/2024 10:11 PM  Performed by: Beverley Leita LABOR, PA-C Authorized by: Beverley Leita LABOR, PA-C   Consent:    Consent obtained:  Verbal   Consent given by:  Patient (son)   Risks, benefits, and alternatives were discussed: yes     Risks discussed:  Infection, pain, need for additional repair, poor cosmetic result and poor wound healing   Alternatives discussed:  No treatment Universal protocol:    Patient identity confirmed:  Verbally with patient Anesthesia:    Anesthesia method:  Topical application and local infiltration   Topical anesthetic:  LET   Local anesthetic:  Lidocaine  2% WITH epi Laceration details:    Location:  Scalp   Scalp location:  Crown   Length (cm):  5   Depth (mm):  5 Pre-procedure details:    Preparation:  Patient was prepped and draped in usual sterile fashion and imaging obtained to evaluate for foreign bodies Exploration:    Hemostasis achieved with:  LET and epinephrine    Imaging obtained comment:  CT   Imaging outcome: foreign body not noted     Wound exploration: wound explored through full range of motion and entire depth of wound visualized     Wound extent: no foreign body and no underlying fracture     Contaminated: no   Treatment:    Area cleansed with:  Saline   Amount of cleaning:  Standard   Irrigation solution:  Sterile saline Skin repair:    Repair method:  Staples   Number of staples:  4 Approximation:    Approximation:  Close Repair type:    Repair type:  Simple Post-procedure details:    Dressing:  Open (no dressing)   Procedure completion:  Tolerated well, no immediate complications    Medications Ordered in the ED  lidocaine -EPINEPHrine -tetracaine  (LET) topical gel (3 mLs Topical Given  05/13/24 2105)  lidocaine -EPINEPHrine  (XYLOCAINE  W/EPI) 2 %-1:200000 (PF) injection 10 mL (10 mLs Infiltration Given 05/13/24 2105)                                    Medical Decision Making Amount and/or Complexity of Data Reviewed Radiology: ordered.  Risk Prescription drug management.   This patient presents to the ED for concern of fall, this involves an extensive number of treatment options, and is a complaint that carries with it a high risk of complications and morbidity.  The differential diagnosis includes intracranial hemorrhage, skull fracture, c-spine injury   Co morbidities / Chronic conditions that complicate the patient evaluation  Parkinson's, diabetes, hypertension, hyperlipidemia, GERD, COPD   Additional history obtained:  Additional history obtained from EMR Son at bedside who contributes to history as above External records from outside source obtained and reviewed including history on file    Imaging Studies ordered:  I ordered imaging studies including CT head, CT C-spine I independently visualized and interpreted imaging which showed no  acute injury I agree with the radiologist interpretation    Problem List / ED Course / Critical interventions / Medication management  78 year old male brought in by EMS from living facility.  Patient states that he was bending over to pick up the remote off of the floor when he fell forward hitting his head on the ground.  No loss of consciousness.  Bleeding controlled on arrival, not on thinners.  CT head and C-spine reassuring.  Wound was cleaned with saline and closed with staples.  Recommend wound recheck with PCP in 2 days, stable removal in 7 days.  Return to ER for any worsening or concerning symptoms. I ordered medication including LAT, lidocaine  with epi Reevaluation of the patient after these medicines showed that the patient tolerated procedure well I have reviewed the patients home medicines and have made  adjustments as needed    Social Determinants of Health:  Lives at facility   Test / Admission - Considered:  Stable for discharge      Final diagnoses:  Fall,  initial encounter  Laceration of scalp, initial encounter    ED Discharge Orders     None          Beverley Leita DELENA DEVONNA 05/13/24 2214    Albertina Dixon, MD 05/14/24 [phone]

## 2024-05-13 NOTE — ED Notes (Signed)
 RN reviewed discharge instructions with pt. Pt verbalized understanding and had no further questions. VSS upon discharge.

## 2024-05-13 NOTE — ED Notes (Signed)
 This RN attempted to call report to [facility] x3 with no answer. Pt son to transport pt back to facility.

## 2024-05-13 NOTE — ED Notes (Signed)
 Denies any current dizziness/neck pain. No other complaints

## 2024-05-13 NOTE — Discharge Instructions (Signed)
 Keep wound clean and dry. Recheck with your primary care this week as scheduled, staple removal in 7 days. Return to the ER for any worsening or concerning symptoms.

## 2024-05-21 NOTE — Progress Notes (Unsigned)
 Assessment/Plan:   1.  Parkinsons disease, diagnosed September, 2023             -Continue carbidopa /levodopa  25/100, 1.5 tablet 3 times per day but change timing to 7am/11am/4pm.  Discussed timing in relationship to food.  -discussed exercise and info to community programs discussed  -discussed walker at all times.  Discussed type of walker.  He has never fallen with a walker  - Discussed increasing water intake.  Discussed how to do that.  -order for PT given   2.  Alcohol  use             - He is in assisted living and no longer drinking alcohol  at all.   3.  Bilateral ptosis versus pseudoptosis             -Acetylcholine receptor antibodies were negative             -Patient is already seeing both ophthalmology as well as a retina specialist for macular degeneration   4.  Probable diabetic peripheral neuropathy             -Patient sees endocrinology outside of our system.    5.  LBP  -seeing sports med, Dr. Cleatrice and Dr. Louis  6.  History of hypertension  -On multiple antihypertensives.  Blood pressure was low in the hospital and he was orthostatic in the hospital.  He and I discussed concept of permissive hypertension in Parkinsons disease.  Patient is multiple antihypertensives.  7.  UTI's  - Follows with [facility].  8.  Uncontrolled diabetes  - Last A1c was 9.7 back in April but they report had one today at endocrinology and it was better and was 8.3  9.  Hallucinations with day/night reversal  -d/c gabapentin  -start seroquel  25 mg q hs x 1 month and then 1/2 q am and 1 at bed  - Discussed importance of regular, daily schedule and discussed exactly what that meant.  Limit napping in the day  Subjective:   Karl Lawson. was seen today in follow up for Parkinsons disease.  My previous records were reviewed prior to todays visit as well as outside records available to me.  Pt with sister in law who supplements the history.   I last saw the patient at the  very end of May.  Patient went to the emergency room June 30 for evaluation of confusion and hallucinations for 1 week.  Emergency room records indicate that it started after he was started on melatonin.  Emergency room workup was unremarkable and patient was discharged home.  He saw his primary care physician on July 1 and was told to make a follow-up with me as it was suspected that his confusion and cognitive slowing could be associated with his Parkinson's.  I did have the opportunity to talk with his primary care through the EMR, noting that acute hallucinations are generally not associated with Parkinson's, but also noted that the medical workup have thus far been negative.  Patient was back in the emergency room on July 7 after a fall in which he hit his head.  Patient was apparently already bending over to pick up something off of the floor and his center of gravity continue to go forward and fell and hit the top of his head on the ground.  Neuroimaging in the emergency room was unremarkable.  Patient sustained a laceration to his head and staples were placed.  He is having hallucinations.  He will see his son even though he isn't there.  I realized it wasn't real when he wouldn't talk back.  It happens a few times per week.  Having trouble sleeping.  Wife died end of 28-Feb-2024.   They note that they were at endocrinology this morning and A1C was better.  His A1C was 8.3  he is not drinking any alcohol .    Current prescribed movement disorder medications: Carbidopa /levodopa  25/100, 1.5 tablets 3 times per day  Prior medications: Hallucinations with Robitussin; quetiapine  (started in the hospital when he had urosepsis)  ALLERGIES:  No Known Allergies  CURRENT MEDICATIONS:  Current Meds  Medication Sig   acetaminophen  (TYLENOL ) 500 MG tablet Take 500 mg by mouth every 6 (six) hours as needed for moderate pain.   albuterol  (VENTOLIN  HFA) 108 (90 Base) MCG/ACT inhaler Inhale 2 puffs into the lungs  every 6 (six) hours as needed for wheezing or shortness of breath.   ascorbic Acid (VITAMIN C) 500 MG CPCR Take 500 mg by mouth daily.   aspirin  EC 81 MG tablet Take 81 mg by mouth daily.   atorvastatin  (LIPITOR) 40 MG tablet Take 1 tablet (40 mg total) by mouth daily.   carbidopa -levodopa  (SINEMET  IR) 25-100 MG tablet TAKE 1.5 TABLETS BY MOUTH 3 (THREE) TIMES DAILY. 6AM/10-11AM/3-4PM   cyanocobalamin  1000 MCG tablet Take 1 tablet (1,000 mcg total) by mouth daily.   dorzolamide-timolol (COSOPT) 2-0.5 % ophthalmic solution Place 1 drop into the left eye 2 (two) times daily.   finasteride  (PROSCAR ) 5 MG tablet Take 1 tablet (5 mg total) by mouth daily.   gabapentin (NEURONTIN) 100 MG capsule Take by mouth.   Glucose 15 g PACK Take 15 g by mouth as needed (hypoglycemia).   insulin  aspart (NOVOLOG ) 100 UNIT/ML injection Inject 0-9 Units into the skin 3 (three) times daily with meals. CBG < 70:treat low blood sugar CBG 70 - 120: 0 units  CBG 121 - 150: 1 unit  CBG 151 - 200: 2 units  CBG 201 - 250: 3 units  CBG 251 - 300: 5 units  CBG 301 - 350: 7 units  CBG 351 - 400: 9 units  CBG > 400: call MD   insulin  glargine-yfgn (SEMGLEE ) 100 UNIT/ML injection Inject 0.2 mLs (20 Units total) into the skin daily.   JARDIANCE  10 MG TABS tablet Take 10 mg by mouth daily.   Metamucil Fiber CHEW Chew 1 each by mouth daily.   metFORMIN  (GLUCOPHAGE ) 500 MG tablet Take 500 mg by mouth 2 (two) times daily with a meal.   Misc. Devices MISC Rollator. G20.A2   omeprazole  (PRILOSEC) 40 MG capsule Take 1 capsule (40 mg total) by mouth daily.   QUEtiapine  (SEROQUEL ) 25 MG tablet 1 po q hs x 1 month, then 1/2 in the AM, 1 at bed   tamsulosin  (FLOMAX ) 0.4 MG CAPS capsule Take 1 capsule (0.4 mg total) by mouth daily after supper.   tobramycin (TOBREX) 0.3 % ophthalmic solution Place 1 drop into the left eye daily as needed (to prevent infection when getting injection).   [DISCONTINUED] QUEtiapine  (SEROQUEL ) 25 MG tablet  Take 1 tablet (25 mg total) by mouth at bedtime.     Objective:   PHYSICAL EXAMINATION:    VITALS:   Vitals:   05/22/24 1311  BP: 132/80  Pulse: 76  SpO2: 96%  Weight: 205 lb 3.2 oz (93.1 kg)     GEN:  The patient appears stated age and is in NAD. HEENT:  Normocephalic, atraumatic.  The mucous membranes are moist. The superficial temporal arteries are without ropiness or tenderness. CV:  rrr Lungs:  CTAB Neck/HEME:  There are no carotid bruits bilaterally.   Neurological examination:   Orientation: The patient is alert and oriented x3.  Cranial nerves: There is good facial symmetry.  There is bilateral ptosis vs pseudoptosis.  There is hypomimia with lips parted at times.  The speech is fluent and clear. Soft palate rises symmetrically and there is no tongue deviation. Hearing is intact to conversational tone. Sensation: Sensation is intact to light touch throughout  Motor: Strength is at least antigravity x 4   Movement examination: Tone: There is mild increased in the RLE Abnormal movements: none Coordination:  There is no decremation with any form of RAMS, including alternating supination and pronation of the forearm, hand opening and closing, finger taps, heel taps.  Mild decremation with toe taps on the L Gait and Station: The patient pushes off to arise.  He slightly drags the L leg.  I have reviewed and interpreted the following labs independently    Chemistry      Component Value Date/Time   NA 139 05/06/2024 1917   NA 142 05/17/2022 1354   K 3.7 05/06/2024 1917   CL 106 05/06/2024 1917   CO2 22 05/06/2024 1917   BUN 21 05/06/2024 1917   BUN 10 05/17/2022 1354   CREATININE 0.88 05/06/2024 1917      Component Value Date/Time   CALCIUM  9.8 05/06/2024 1917   ALKPHOS 108 05/06/2024 1917   AST 12 (L) 05/06/2024 1917   ALT 6 05/06/2024 1917   BILITOT 0.7 05/06/2024 1917       Lab Results  Component Value Date   WBC 8.5 05/06/2024   HGB 12.4 (L)  05/06/2024   HCT 38.9 (L) 05/06/2024   MCV 81.0 05/06/2024   PLT 228 05/06/2024    Lab Results  Component Value Date   TSH 2.134 11/24/2023     Total time spent on today's visit was 40 minutes, including both face-to-face time and nonface-to-face time.  Time included that spent on review of records (prior notes available to me/labs/imaging if pertinent), discussing treatment and goals, answering patient's questions and coordinating care.  Cc:  Berneta Elsie Sayre, MD

## 2024-05-22 ENCOUNTER — Ambulatory Visit: Admitting: Neurology

## 2024-05-22 ENCOUNTER — Encounter: Payer: Self-pay | Admitting: Neurology

## 2024-05-22 VITALS — BP 132/80 | HR 76 | Wt 205.2 lb

## 2024-05-22 DIAGNOSIS — G20A1 Parkinson's disease without dyskinesia, without mention of fluctuations: Secondary | ICD-10-CM | POA: Diagnosis not present

## 2024-05-22 DIAGNOSIS — R441 Visual hallucinations: Secondary | ICD-10-CM

## 2024-05-22 MED ORDER — QUETIAPINE FUMARATE 25 MG PO TABS: ORAL_TABLET | ORAL | 1 refills | Status: DC

## 2024-05-23 NOTE — Progress Notes (Signed)
 Remote pacemaker transmission.

## 2024-05-28 ENCOUNTER — Telehealth: Payer: Self-pay | Admitting: Family Medicine

## 2024-05-28 NOTE — Telephone Encounter (Signed)
 error

## 2024-06-27 ENCOUNTER — Telehealth: Payer: Self-pay | Admitting: Neurology

## 2024-06-27 NOTE — Telephone Encounter (Signed)
 Pt.s son is asking for ealier appt to speak and change Rx's father is using

## 2024-06-28 ENCOUNTER — Telehealth: Payer: Self-pay | Admitting: Neurology

## 2024-06-28 NOTE — Telephone Encounter (Signed)
 Pt's son Julianna called this afternoon and wanting to see if his father can get a prescriptions called Nuplazid. Thanks

## 2024-06-28 NOTE — Telephone Encounter (Signed)
 Sent my chart message  to call office, closing out encounter.

## 2024-06-28 NOTE — Telephone Encounter (Signed)
No answer will send mychart message

## 2024-06-28 NOTE — Telephone Encounter (Signed)
 No answer at 2:08pm 06/28/2024

## 2024-07-01 ENCOUNTER — Telehealth: Payer: Self-pay | Admitting: Neurology

## 2024-07-01 NOTE — Telephone Encounter (Signed)
 Pt's son called in and stated that he wanted that medication for his father, due to the father has been hallucinating every day and he is confused as well. Son stated that he read that this medicine is good for Parkinson's  Disease. The Nuplazid is the medication.  Thanks

## 2024-07-02 ENCOUNTER — Telehealth: Payer: Self-pay

## 2024-07-02 NOTE — Telephone Encounter (Signed)
 I discussed with Garrel that you did discuss this Nuplazid with the wife and patient. Mention he is welcome to come to visit to hear more of the converesations and he said he,he had been busy. They will stay on Seroquel  and discuss this further if needed. Thanked me for calling and all questions were answered.

## 2024-07-02 NOTE — Telephone Encounter (Signed)
 Son Garrel called whom is On the [facility]. His wife seen great reviews on the Nuplazid. That is why he was calling to see if you could send a new Rx in.His hallucinations are getting worse and not sleeping at night, more sleeping throughout the day. I am routing this to Dr.Rebecca Tat to review.

## 2024-07-05 ENCOUNTER — Ambulatory Visit (INDEPENDENT_AMBULATORY_CARE_PROVIDER_SITE_OTHER)

## 2024-07-05 DIAGNOSIS — I4430 Unspecified atrioventricular block: Secondary | ICD-10-CM | POA: Diagnosis not present

## 2024-07-06 LAB — CUP PACEART REMOTE DEVICE CHECK
Battery Remaining Longevity: 140 mo
Battery Voltage: 3.12 V
Brady Statistic AP VP Percent: 23.06 %
Brady Statistic AP VS Percent: 0.01 %
Brady Statistic AS VP Percent: 76.74 %
Brady Statistic AS VS Percent: 0.2 %
Brady Statistic RA Percent Paced: 23.03 %
Brady Statistic RV Percent Paced: 99.8 %
Date Time Interrogation Session: 20250829015820
Implantable Lead Connection Status: 753985
Implantable Lead Connection Status: 753985
Implantable Lead Implant Date: 20241017
Implantable Lead Implant Date: 20241017
Implantable Lead Location: 753859
Implantable Lead Location: 753860
Implantable Lead Model: 3830
Implantable Lead Model: 5076
Implantable Pulse Generator Implant Date: 20241017
Lead Channel Impedance Value: 323 Ohm
Lead Channel Impedance Value: 342 Ohm
Lead Channel Impedance Value: 494 Ohm
Lead Channel Impedance Value: 589 Ohm
Lead Channel Pacing Threshold Amplitude: 0.75 V
Lead Channel Pacing Threshold Amplitude: 1 V
Lead Channel Pacing Threshold Pulse Width: 0.4 ms
Lead Channel Pacing Threshold Pulse Width: 0.4 ms
Lead Channel Sensing Intrinsic Amplitude: 19.625 mV
Lead Channel Sensing Intrinsic Amplitude: 19.625 mV
Lead Channel Sensing Intrinsic Amplitude: 5.75 mV
Lead Channel Sensing Intrinsic Amplitude: 5.75 mV
Lead Channel Setting Pacing Amplitude: 1.5 V
Lead Channel Setting Pacing Amplitude: 2 V
Lead Channel Setting Pacing Pulse Width: 0.4 ms
Lead Channel Setting Sensing Sensitivity: 1.2 mV
Zone Setting Status: 755011

## 2024-07-08 ENCOUNTER — Ambulatory Visit: Payer: Self-pay | Admitting: Internal Medicine

## 2024-07-15 NOTE — Progress Notes (Signed)
 Remote PPM Transmission

## 2024-08-06 ENCOUNTER — Ambulatory Visit: Admitting: Family Medicine

## 2024-08-08 ENCOUNTER — Encounter: Payer: Self-pay | Admitting: Family Medicine

## 2024-08-08 ENCOUNTER — Ambulatory Visit: Admitting: Family Medicine

## 2024-08-08 VITALS — BP 126/70 | HR 78 | Temp 97.4°F | Ht 68.0 in | Wt 207.6 lb

## 2024-08-08 DIAGNOSIS — Z23 Encounter for immunization: Secondary | ICD-10-CM

## 2024-08-08 DIAGNOSIS — G4701 Insomnia due to medical condition: Secondary | ICD-10-CM | POA: Diagnosis not present

## 2024-08-08 DIAGNOSIS — E7800 Pure hypercholesterolemia, unspecified: Secondary | ICD-10-CM

## 2024-08-08 DIAGNOSIS — I10 Essential (primary) hypertension: Secondary | ICD-10-CM

## 2024-08-08 LAB — LDL CHOLESTEROL, DIRECT: Direct LDL: 43 mg/dL

## 2024-08-08 LAB — BASIC METABOLIC PANEL WITH GFR
BUN: 16 mg/dL (ref 6–23)
CO2: 27 meq/L (ref 19–32)
Calcium: 9.8 mg/dL (ref 8.4–10.5)
Chloride: 104 meq/L (ref 96–112)
Creatinine, Ser: 0.93 mg/dL (ref 0.40–1.50)
GFR: 78.46 mL/min (ref >60.00)
Glucose, Bld: 155 mg/dL — ABNORMAL HIGH (ref 70–99)
Potassium: 3.8 meq/L (ref 3.5–5.1)
Sodium: 140 meq/L (ref 135–145)

## 2024-08-08 LAB — MICROALBUMIN / CREATININE URINE RATIO
Creatinine,U: 53.9 mg/dL
Microalb Creat Ratio: 41.2 mg/g — ABNORMAL HIGH (ref 0.0–30.0)
Microalb, Ur: 2.2 mg/dL — ABNORMAL HIGH (ref 0.0–1.9)

## 2024-08-08 NOTE — Progress Notes (Signed)
 Okay thanks that it was it was yeah thanks  Established Patient Office Visit   Subjective:  Patient ID: Karl Bracco., male    DOB: Jul 23, 1946  Age: 78 y.o. MRN: [id number]  Chief Complaint  Patient presents with   Medical Management of Chronic Issues    3 month follow up.    HPI Encounter Diagnoses  Name Primary?   Essential hypertension Yes   Pure hypercholesterolemia    Insomnia due to medical condition    Immunization due    For follow-up accompanied by his sister-in-law Jan.  Doing better.  Sleep has improved with the quetiapine .  He is no longer hallucinating.  Continues follow-up with endocrinology for diabetes and neurology for Parkinson's.  Sounds as though assisted living personnel are waking him up to administer his evening dose of Lantus .  Blood pressure is well-controlled with current regimen.  He is no longer feeling as lightheaded.   Review of Systems  Constitutional: Negative.   HENT: Negative.    Eyes:  Negative for blurred vision, discharge and redness.  Respiratory: Negative.    Cardiovascular: Negative.   Gastrointestinal:  Negative for abdominal pain.  Genitourinary: Negative.   Musculoskeletal: Negative.  Negative for myalgias.  Skin:  Negative for rash.  Neurological:  Negative for tingling, loss of consciousness and weakness.  Endo/Heme/Allergies:  Negative for polydipsia.     Current Outpatient Medications:    acetaminophen  (TYLENOL ) 500 MG tablet, Take 500 mg by mouth every 6 (six) hours as needed for moderate pain., Disp: , Rfl:    albuterol  (VENTOLIN  HFA) 108 (90 Base) MCG/ACT inhaler, Inhale 2 puffs into the lungs every 6 (six) hours as needed for wheezing or shortness of breath., Disp: , Rfl:    ascorbic Acid (VITAMIN C) 500 MG CPCR, Take 500 mg by mouth daily., Disp: , Rfl:    aspirin  EC 81 MG tablet, Take 81 mg by mouth daily., Disp: , Rfl:    atorvastatin  (LIPITOR) 40 MG tablet, Take 1 tablet (40 mg total) by mouth daily., Disp: , Rfl:     carbidopa -levodopa  (SINEMET  IR) 25-100 MG tablet, TAKE 1.5 TABLETS BY MOUTH 3 (THREE) TIMES DAILY. 6AM/10-11AM/3-4PM, Disp: 405 tablet, Rfl: 0   cyanocobalamin  1000 MCG tablet, Take 1 tablet (1,000 mcg total) by mouth daily., Disp: , Rfl:    dorzolamide-timolol (COSOPT) 2-0.5 % ophthalmic solution, Place 1 drop into the left eye 2 (two) times daily., Disp: , Rfl:    finasteride  (PROSCAR ) 5 MG tablet, Take 1 tablet (5 mg total) by mouth daily., Disp: 90 tablet, Rfl: 3   gabapentin (NEURONTIN) 100 MG capsule, Take by mouth., Disp: , Rfl:    Glucose 15 g PACK, Take 15 g by mouth as needed (hypoglycemia)., Disp: , Rfl:    insulin  aspart (NOVOLOG ) 100 UNIT/ML injection, Inject 0-9 Units into the skin 3 (three) times daily with meals. CBG < 70:treat low blood sugar CBG 70 - 120: 0 units  CBG 121 - 150: 1 unit  CBG 151 - 200: 2 units  CBG 201 - 250: 3 units  CBG 251 - 300: 5 units  CBG 301 - 350: 7 units  CBG 351 - 400: 9 units  CBG > 400: call MD, Disp: , Rfl:    insulin  glargine-yfgn (SEMGLEE ) 100 UNIT/ML injection, Inject 0.2 mLs (20 Units total) into the skin daily., Disp: , Rfl:    JARDIANCE  10 MG TABS tablet, Take 10 mg by mouth daily., Disp: , Rfl:    Metamucil Fiber  CHEW, Chew 1 each by mouth daily., Disp: , Rfl:    metFORMIN  (GLUCOPHAGE ) 500 MG tablet, Take 500 mg by mouth 2 (two) times daily with a meal., Disp: , Rfl:    Misc. Devices MISC, Rollator. G20.A2, Disp: 1 each, Rfl: 0   omeprazole  (PRILOSEC) 40 MG capsule, Take 1 capsule (40 mg total) by mouth daily., Disp: 90 capsule, Rfl: 3   QUEtiapine  (SEROQUEL ) 25 MG tablet, 1 po q hs x 1 month, then 1/2 in the AM, 1 at bed, Disp: 135 tablet, Rfl: 1   tamsulosin  (FLOMAX ) 0.4 MG CAPS capsule, Take 1 capsule (0.4 mg total) by mouth daily after supper., Disp: , Rfl:    tobramycin (TOBREX) 0.3 % ophthalmic solution, Place 1 drop into the left eye daily as needed (to prevent infection when getting injection)., Disp: , Rfl:    Objective:     BP  126/70 (BP Location: Right Arm, Patient Position: Sitting, Cuff Size: Normal)   Pulse 78   Temp (!) 97.4 F (36.3 C) (Temporal)   Ht 5' 8 (1.727 m)   Wt 207 lb 9.6 oz (94.2 kg)   SpO2 96%   BMI 31.57 kg/m    Physical Exam Constitutional:      General: He is not in acute distress.    Appearance: Normal appearance. He is not ill-appearing, toxic-appearing or diaphoretic.  HENT:     Head: Normocephalic and atraumatic.     Right Ear: External ear normal.     Left Ear: External ear normal.  Eyes:     General: No scleral icterus.       Right eye: No discharge.        Left eye: No discharge.     Extraocular Movements: Extraocular movements intact.     Conjunctiva/sclera: Conjunctivae normal.  Cardiovascular:     Rate and Rhythm: Normal rate and regular rhythm.  Pulmonary:     Effort: Pulmonary effort is normal. No respiratory distress.     Breath sounds: No wheezing or rales.  Skin:    General: Skin is warm and dry.  Neurological:     Mental Status: He is alert and oriented to person, place, and time.  Psychiatric:        Mood and Affect: Mood normal.        Behavior: Behavior normal.      No results found for any visits on 08/08/24.    The ASCVD Risk score (Arnett DK, et al., 2019) failed to calculate for the following reasons:   The valid total cholesterol range is 130 to 320 mg/dL    Assessment & Plan:   Essential hypertension -     Microalbumin / creatinine urine ratio -     Basic metabolic panel with GFR  Pure hypercholesterolemia -     LDL cholesterol, direct  Insomnia due to medical condition  Immunization due -     Flu vaccine HIGH DOSE PF(Fluzone Trivalent) -     Pneumococcal conjugate vaccine 20-valent    Return in about 6 months (around 02/06/2025), or Please administer Lantus  before bedtime..  Continue quetiapine  at current dose.  Continue follow-up with endocrinology and neurology.   Elsie Sim Lent, MD  10/3 addendum: Increasing micro  albumin to creatinine ratio.  May need to consider adding a low-dose ACE.  Unfortunately he has suffered from lightheadedness and hypotension.

## 2024-08-09 ENCOUNTER — Ambulatory Visit: Payer: Self-pay | Admitting: Family Medicine

## 2024-10-07 ENCOUNTER — Ambulatory Visit

## 2024-10-07 DIAGNOSIS — I4430 Unspecified atrioventricular block: Secondary | ICD-10-CM

## 2024-10-08 LAB — CUP PACEART REMOTE DEVICE CHECK
Battery Remaining Longevity: 137 mo
Battery Voltage: 3.06 V
Brady Statistic AP VP Percent: 23.08 %
Brady Statistic AP VS Percent: 0.03 %
Brady Statistic AS VP Percent: 76.82 %
Brady Statistic AS VS Percent: 0.08 %
Brady Statistic RA Percent Paced: 23.13 %
Brady Statistic RV Percent Paced: 99.89 %
Date Time Interrogation Session: 20251130231623
Implantable Lead Connection Status: 753985
Implantable Lead Connection Status: 753985
Implantable Lead Implant Date: 20241017
Implantable Lead Implant Date: 20241017
Implantable Lead Location: 753859
Implantable Lead Location: 753860
Implantable Lead Model: 3830
Implantable Lead Model: 5076
Implantable Pulse Generator Implant Date: 20241017
Lead Channel Impedance Value: 323 Ohm
Lead Channel Impedance Value: 361 Ohm
Lead Channel Impedance Value: 494 Ohm
Lead Channel Impedance Value: 532 Ohm
Lead Channel Pacing Threshold Amplitude: 0.625 V
Lead Channel Pacing Threshold Amplitude: 1 V
Lead Channel Pacing Threshold Pulse Width: 0.4 ms
Lead Channel Pacing Threshold Pulse Width: 0.4 ms
Lead Channel Sensing Intrinsic Amplitude: 10.125 mV
Lead Channel Sensing Intrinsic Amplitude: 10.125 mV
Lead Channel Sensing Intrinsic Amplitude: 20 mV
Lead Channel Sensing Intrinsic Amplitude: 20 mV
Lead Channel Setting Pacing Amplitude: 1.5 V
Lead Channel Setting Pacing Amplitude: 2 V
Lead Channel Setting Pacing Pulse Width: 0.4 ms
Lead Channel Setting Sensing Sensitivity: 1.2 mV
Zone Setting Status: 755011

## 2024-10-11 ENCOUNTER — Ambulatory Visit: Payer: Self-pay | Admitting: Internal Medicine

## 2024-10-11 NOTE — Progress Notes (Signed)
 Remote PPM Transmission

## 2024-10-13 ENCOUNTER — Emergency Department (HOSPITAL_COMMUNITY)

## 2024-10-13 ENCOUNTER — Other Ambulatory Visit: Payer: Self-pay

## 2024-10-13 ENCOUNTER — Emergency Department (HOSPITAL_COMMUNITY)
Admission: EM | Admit: 2024-10-13 | Discharge: 2024-10-14 | Disposition: A | Source: Skilled Nursing Facility | Attending: Emergency Medicine | Admitting: Emergency Medicine

## 2024-10-13 ENCOUNTER — Encounter (HOSPITAL_COMMUNITY): Payer: Self-pay

## 2024-10-13 DIAGNOSIS — R441 Visual hallucinations: Secondary | ICD-10-CM | POA: Diagnosis not present

## 2024-10-13 DIAGNOSIS — F060 Psychotic disorder with hallucinations due to known physiological condition: Secondary | ICD-10-CM | POA: Diagnosis not present

## 2024-10-13 DIAGNOSIS — G20A1 Parkinson's disease without dyskinesia, without mention of fluctuations: Secondary | ICD-10-CM | POA: Diagnosis present

## 2024-10-13 DIAGNOSIS — G20B2 Parkinson's disease with dyskinesia, with fluctuations: Secondary | ICD-10-CM

## 2024-10-13 DIAGNOSIS — G20C Parkinsonism, unspecified: Secondary | ICD-10-CM

## 2024-10-13 LAB — COMPREHENSIVE METABOLIC PANEL WITH GFR
ALT: 17 U/L (ref 0–44)
AST: 11 U/L — ABNORMAL LOW (ref 15–41)
Albumin: 4.5 g/dL (ref 3.5–5.0)
Alkaline Phosphatase: 122 U/L (ref 38–126)
Anion gap: 15 (ref 5–15)
BUN: 15 mg/dL (ref 8–23)
CO2: 21 mmol/L — ABNORMAL LOW (ref 22–32)
Calcium: 10.2 mg/dL (ref 8.9–10.3)
Chloride: 104 mmol/L (ref 98–111)
Creatinine, Ser: 0.89 mg/dL (ref 0.61–1.24)
GFR, Estimated: >60 mL/min (ref >60)
Glucose, Bld: 167 mg/dL — ABNORMAL HIGH (ref 70–99)
Potassium: 3.3 mmol/L — ABNORMAL LOW (ref 3.5–5.1)
Sodium: 139 mmol/L (ref 135–145)
Total Bilirubin: 1.1 mg/dL (ref 0.0–1.2)
Total Protein: 6.8 g/dL (ref 6.5–8.1)

## 2024-10-13 LAB — URINALYSIS, ROUTINE W REFLEX MICROSCOPIC
Bacteria, UA: NONE SEEN
Bilirubin Urine: NEGATIVE
Glucose, UA: >=500 mg/dL — AB
Hgb urine dipstick: NEGATIVE
Ketones, ur: 20 mg/dL — AB
Leukocytes,Ua: NEGATIVE
Nitrite: NEGATIVE
Protein, ur: 30 mg/dL — AB
Specific Gravity, Urine: 1.026 (ref 1.005–1.030)
pH: 5 (ref 5.0–8.0)

## 2024-10-13 LAB — CBC
HCT: 44.3 % (ref 39.0–52.0)
Hemoglobin: 14.1 g/dL (ref 13.0–17.0)
MCH: 26.5 pg (ref 26.0–34.0)
MCHC: 31.8 g/dL (ref 30.0–36.0)
MCV: 83.1 fL (ref 80.0–100.0)
Platelets: 202 K/uL (ref 150–400)
RBC: 5.33 MIL/uL (ref 4.22–5.81)
RDW: 15.9 % — ABNORMAL HIGH (ref 11.5–15.5)
WBC: 8.5 K/uL (ref 4.0–10.5)
nRBC: 0 % (ref 0.0–0.2)

## 2024-10-13 LAB — URINE DRUG SCREEN
Amphetamines: NEGATIVE
Barbiturates: NEGATIVE
Benzodiazepines: NEGATIVE
Cocaine: NEGATIVE
Fentanyl: NEGATIVE
Methadone Scn, Ur: NEGATIVE
Opiates: NEGATIVE
Tetrahydrocannabinol: NEGATIVE

## 2024-10-13 LAB — ETHANOL: Alcohol, Ethyl (B): <15 mg/dL (ref <15)

## 2024-10-13 MED ORDER — ZIPRASIDONE MESYLATE 20 MG IM SOLR: 20.0000 mg | INTRAMUSCULAR | Status: DC | PRN

## 2024-10-13 MED ORDER — ACETAMINOPHEN 325 MG PO TABS: 650.0000 mg | ORAL_TABLET | ORAL | Status: DC | PRN

## 2024-10-13 MED ORDER — LORAZEPAM 1 MG PO TABS: 1.0000 mg | ORAL_TABLET | ORAL | Status: DC | PRN

## 2024-10-13 MED ORDER — RISPERIDONE 0.5 MG PO TBDP: 2.0000 mg | ORAL_TABLET | Freq: Three times a day (TID) | ORAL | Status: DC | PRN

## 2024-10-13 MED ORDER — ONDANSETRON HCL 4 MG PO TABS: 4.0000 mg | ORAL_TABLET | Freq: Three times a day (TID) | ORAL | Status: DC | PRN
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
## 2024-10-13 NOTE — ED Provider Notes (Signed)
 [place] EMERGENCY DEPARTMENT AT [facility] Provider Note   CSN: [id number] Arrival date & time: 10/13/24  9089     Patient presents with: Psychiatric Evaluation   Karl Lawson. is a 78 y.o. male with a history of T2DM and Parkinsons who presents to the ED from nursing facility with increase in auditory/visual hallucinations. Associated symptoms include aggressive behavior with other residents and confusion. The patient's son, who is present for visit, is a good historian. Patient's son states that this is out of his normal, he also states that patient has had UTI's in the past and will sometimes experience hallucinations. Patient is aware of hallucinations, however he is unsure why he is having them.  The patient is pleasant and in no acute distress at this time.    HPI     Prior to Admission medications   Medication Sig Start Date End Date Taking? Authorizing Provider  acetaminophen  (TYLENOL ) 500 MG tablet Take 500 mg by mouth every 6 (six) hours as needed for moderate pain.   Yes [provider]  albuterol  (VENTOLIN  HFA) 108 (90 Base) MCG/ACT inhaler Inhale 2 puffs into the lungs every 6 (six) hours as needed for wheezing or shortness of breath.   Yes [provider]  ascorbic Acid (VITAMIN C) 500 MG CPCR Take 500 mg by mouth daily.   Yes [provider]  aspirin  EC 81 MG tablet Take 81 mg by mouth daily.   Yes [provider]  atorvastatin  (LIPITOR) 40 MG tablet Take 1 tablet (40 mg total) by mouth daily. 08/26/23  Yes Perri DELENA Meliton Mickey., MD  carbidopa -levodopa  (SINEMET  IR) 25-100 MG tablet TAKE 1.5 TABLETS BY MOUTH 3 (THREE) TIMES DAILY. 6AM/10-11AM/3-4PM 08/16/23  Yes Tat, Asberry RAMAN, DO  cyanocobalamin  1000 MCG tablet Take 1 tablet (1,000 mcg total) by mouth daily. 12/05/23  Yes Jillian Buttery, MD  dorzolamide -timolol  (COSOPT ) 2-0.5 % ophthalmic solution Place 1 drop into the left eye 2 (two) times daily.   Yes [provider]  finasteride  (PROSCAR ) 5 MG tablet Take 1 tablet (5 mg total) by mouth daily. 05/10/23  Yes Berneta Elsie Sayre, MD  Glucose 15 g PACK Take 15 g by mouth as needed (hypoglycemia).   Yes [provider]  insulin  aspart (NOVOLOG ) 100 UNIT/ML injection Inject 0-9 Units into the skin 3 (three) times daily with meals. CBG < 70:treat low blood sugar CBG 70 - 120: 0 units  CBG 121 - 150: 1 unit  CBG 151 - 200: 2 units  CBG 201 - 250: 3 units  CBG 251 - 300: 5 units  CBG 301 - 350: 7 units  CBG 351 - 400: 9 units  CBG > 400: call MD Patient taking differently: Inject 0-7 Units into the skin 3 (three) times daily with meals. CBG < 120 units: 0 units CBG 121 - 150: 2 unit  CBG 151 - 200: 3 units  CBG 201 - 250: 4 units  CBG 251 - 300: 5 units  CBG 301 - 350: 6 units  CBG 351 - 400: 7 units  CBG > 400: call MD 08/25/23  Yes Perri DELENA Meliton Mickey., MD  insulin  glargine-yfgn (SEMGLEE ) 100 UNIT/ML injection Inject 0.2 mLs (20 Units total) into the skin daily. Patient taking differently: Inject 10 Units into the skin daily. 12/05/23  Yes Jillian Buttery, MD  JARDIANCE  10 MG TABS tablet Take 10 mg by mouth daily. 04/03/20  Yes [provider]  Metamucil Fiber CHEW [place]  1 each by mouth daily.   Yes [provider]  metFORMIN  (GLUCOPHAGE ) 500 MG tablet Take 500 mg by mouth 2 (two) times daily with a meal.   Yes [provider]  methenamine  (HIPREX ) 1 g tablet Take 1 g by mouth 2 (two) times daily.   Yes [provider]  nystatin cream (MYCOSTATIN) Apply 1 Application topically 2 (two) times daily.   Yes [provider]  omeprazole  (PRILOSEC) 40 MG capsule Take 1 capsule (40 mg total) by mouth daily. 05/10/23  Yes Berneta Elsie Sayre, MD  QUEtiapine  (SEROQUEL ) 25 MG tablet 1 po q hs x 1 month, then 1/2 in the AM, 1 at bed Patient taking differently: Take 12.5 mg by mouth every morning. 05/22/24  Yes Tat, Asberry RAMAN, DO  QUEtiapine   (SEROQUEL ) 25 MG tablet Take 25 mg by mouth at bedtime.   Yes [provider]  tamsulosin  (FLOMAX ) 0.4 MG CAPS capsule Take 1 capsule (0.4 mg total) by mouth daily after supper. 12/04/23  Yes Jillian Buttery, MD  tobramycin  (TOBREX ) 0.3 % ophthalmic solution Place 1 drop into the left eye daily as needed (to prevent infection when getting injection).   Yes [provider]  Misc. Devices MISC Rollator. G20.A2 11/16/23   Karl Knee, FNP    Allergies: Patient has no known allergies.    Review of Systems  Psychiatric/Behavioral:  Positive for hallucinations.     Updated Vital Signs BP (!) 153/88 (BP Location: Right Arm)   Pulse 96   Temp 98 F (36.7 C) (Oral)   Resp 18   SpO2 96%   Physical Exam Vitals and nursing note reviewed.  Constitutional:      General: He is not in acute distress.    Appearance: Normal appearance.  HENT:     Head: Normocephalic and atraumatic.  Eyes:     Extraocular Movements: Extraocular movements intact.     Conjunctiva/sclera: Conjunctivae normal.     Pupils: Pupils are equal, round, and reactive to light.  Cardiovascular:     Rate and Rhythm: Normal rate and regular rhythm.     Pulses: Normal pulses.  Pulmonary:     Effort: Pulmonary effort is normal. No respiratory distress.  Abdominal:     General: Abdomen is flat.     Palpations: Abdomen is soft.     Tenderness: There is no abdominal tenderness.  Musculoskeletal:        General: Normal range of motion.     Cervical back: Normal range of motion.  Skin:    General: Skin is warm and dry.     Capillary Refill: Capillary refill takes less than 2 seconds.  Neurological:     General: No focal deficit present.     Mental Status: He is alert. Mental status is at baseline.     Comments: Patient alert and oriented.  Speech clear and appropriate.  No aphasia or dysarthria.   Cranial nerves III through XII intact: Motor strength 5-5 in all extremities with normal tone and no pronator  drift.   Sensation intact to light touch in upper and lower extremities bilaterally.  No focal neurologic deficits appreciated.   Psychiatric:        Attention and Perception: Attention normal. He does not perceive auditory or visual hallucinations.        Mood and Affect: Mood normal.        Speech: Speech normal.        Behavior: Behavior normal. Behavior is not agitated or  aggressive. Behavior is cooperative.     (all labs ordered are listed, but only abnormal results are displayed) Labs Reviewed  COMPREHENSIVE METABOLIC PANEL WITH GFR - Abnormal; Notable for the following components:      Result Value   Potassium 3.3 (*)    CO2 21 (*)    Glucose, Bld 167 (*)    AST 11 (*)    All other components within normal limits  CBC - Abnormal; Notable for the following components:   RDW 15.9 (*)    All other components within normal limits  URINALYSIS, ROUTINE W REFLEX MICROSCOPIC - Abnormal; Notable for the following components:   Glucose, UA >=500 (*)    Ketones, ur 20 (*)    Protein, ur 30 (*)    All other components within normal limits  ETHANOL  URINE DRUG SCREEN    EKG: None  Radiology: CT Head Wo Contrast Result Date: 10/13/2024 EXAM: CT HEAD WITHOUT CONTRAST 10/13/2024 12:54:12 PM TECHNIQUE: CT of the head was performed without the administration of intravenous contrast. Automated exposure control, iterative reconstruction, and/or weight based adjustment of the mA/kV was utilized to reduce the radiation dose to as low as reasonably achievable. COMPARISON: CT head 05/13/2024 CLINICAL HISTORY: Mental status change, unknown cause FINDINGS: BRAIN AND VENTRICLES: No acute hemorrhage. No evidence of acute infarct. No hydrocephalus. No extra-axial collection. No mass effect or midline shift. ORBITS: No acute abnormality. SINUSES: No acute abnormality. SOFT TISSUES AND SKULL: No acute soft tissue abnormality. No skull fracture. IMPRESSION: 1. No acute intracranial abnormality.  Electronically signed by: Gilmore Molt MD 10/13/2024 01:01 PM EST RP Workstation: [id number]     Procedures   Medications Ordered in the ED  risperiDONE  (RISPERDAL  M-TABS) disintegrating tablet 2 mg (has no administration in time range)    And  LORazepam  (ATIVAN ) tablet 1 mg (has no administration in time range)    And  ziprasidone  (GEODON ) injection 20 mg (has no administration in time range)  acetaminophen  (TYLENOL ) tablet 650 mg (has no administration in time range)  ondansetron  (ZOFRAN ) tablet 4 mg (has no administration in time range)                                 Medical Decision Making Amount and/or Complexity of Data Reviewed Labs: ordered. Decision-making details documented in ED Course. Radiology: ordered. Decision-making details documented in ED Course.  Risk OTC drugs. Prescription drug management.   Patient presents to the ED for: Visual/auditory hallucinations This involves an extensive number of treatment options and is a complaint that carries with it a high risk of complications  Differential diagnosis includes: Medication side effects Metabolic etiology Neurologic etiology Toxic ingestion Co-morbid conditions: Diabetes, hypertension, Parkinsons  Additional history/records obtained and reviewed: Additional history obtained from  outside medical records External records from outside source obtained and reviewed including PCP records regarding psychiatric medication.  Clinical Course as of 10/13/24 2033  Sun Oct 13, 2024  1148 Temp: 54 F (36.7 C) Patient afebrile, vital stable, patient in [ML]  1148 cbc(!) WNL [ML]  1148 Urinalysis, Routine w reflex microscopic -Urine, Clean Catch(!) No acute findings [ML]  1300 Ethanol Negative [ML]  1400 Comprehensive metabolic panel(!) No acute findings [ML]  1400 Rapid urine drug screen (hospital performed) Negative [ML]  1402 CT Head Wo Contrast No acute findings [ML]  1636 EKG 12-Lead Paced rhythm  [ML]  2032 Per psych -patient to be monitored overnight with medication  changes [ML]    Clinical Course User Index [ML] Willma Duwaine CROME, [place]    Data Reviewed / Actions Taken: Labs ordered/reviewed with my independent interpretation in ED course above. Imaging ordered/reviewed with my independent interpretation in ED course above. I agree with the radiologists interpretation.  EKG ordered/reviewed with my independent interpretation in ED course above. The patient did not require continuous cardiac monitoring during the ED stay.  ED Course / Reassessments: Problem List: Hallucinations 78 year old male presented for hallucinations. Initial assessment included history, physical exam, and review of prior medical records. Patients physical exam was unremarkable, patient was cooperative without hallucinations. Laboratory studies and imaging were reassuring. Vital signs were obtained and monitored, and the patient remained stable.  Patient was medically cleared, psychiatry to monitor patient and make medication changes overnight.  Disposition: Disposition: Observation by psychiatry Rationale for disposition: Further evaluation and care of hallucinations.  This note was produced using Electronics Engineer. While I have reviewed and verified all clinical information, transcription errors may remain.      Final diagnoses:  None    ED Discharge Orders     None          Willma Duwaine CROME, [place] 10/13/24 2044    Charlyn Sora, MD 10/14/24 [phone]

## 2024-10-14 MED ORDER — ACETAMINOPHEN 500 MG PO TABS: 500.0000 mg | ORAL_TABLET | Freq: Four times a day (QID) | ORAL | Status: DC | PRN

## 2024-10-14 MED ORDER — QUETIAPINE FUMARATE 25 MG PO TABS
12.5000 mg | ORAL_TABLET | Freq: Every morning | ORAL | Status: DC
  Administered 2024-10-14: 12.5 mg via ORAL
  Filled 2024-10-14: qty 1

## 2024-10-14 MED ORDER — EMPAGLIFLOZIN 10 MG PO TABS: 10.0000 mg | ORAL_TABLET | Freq: Every day | ORAL | Status: DC

## 2024-10-14 MED ORDER — PANTOPRAZOLE SODIUM 40 MG PO TBEC
40.0000 mg | DELAYED_RELEASE_TABLET | Freq: Every day | ORAL | Status: DC
  Administered 2024-10-14: 40 mg via ORAL
  Filled 2024-10-14: qty 1

## 2024-10-14 MED ORDER — DORZOLAMIDE HCL-TIMOLOL MAL 2-0.5 % OP SOLN: 1.0000 [drp] | Freq: Two times a day (BID) | OPHTHALMIC | Status: DC

## 2024-10-14 MED ORDER — VITAMIN B-12 1000 MCG PO TABS: 1000.0000 ug | ORAL_TABLET | Freq: Every day | ORAL | Status: DC

## 2024-10-14 MED ORDER — CARBIDOPA-LEVODOPA 25-100 MG PO TABS
1.5000 | ORAL_TABLET | Freq: Three times a day (TID) | ORAL | Status: DC
  Administered 2024-10-14: 1.5 via ORAL
  Filled 2024-10-14: qty 2

## 2024-10-14 MED ORDER — FINASTERIDE 5 MG PO TABS: 5.0000 mg | ORAL_TABLET | Freq: Every day | ORAL | Status: DC

## 2024-10-14 MED ORDER — ALBUTEROL SULFATE HFA 108 (90 BASE) MCG/ACT IN AERS: 2.0000 | INHALATION_SPRAY | Freq: Four times a day (QID) | RESPIRATORY_TRACT | Status: DC | PRN

## 2024-10-14 MED ORDER — GLUCOSE 15 G PO PACK: 15.0000 g | PACK | ORAL | Status: DC | PRN

## 2024-10-14 MED ORDER — QUETIAPINE FUMARATE 25 MG PO TABS: 37.5000 mg | ORAL_TABLET | Freq: Every day | ORAL | 0 refills | Status: AC

## 2024-10-14 MED ORDER — TOBRAMYCIN 0.3 % OP SOLN: 1.0000 [drp] | Freq: Every day | OPHTHALMIC | Status: DC | PRN

## 2024-10-14 MED ORDER — METHENAMINE HIPPURATE 1 G PO TABS: 1.0000 g | ORAL_TABLET | Freq: Two times a day (BID) | ORAL | Status: DC

## 2024-10-14 MED ORDER — QUETIAPINE FUMARATE 25 MG PO TABS: 25.0000 mg | ORAL_TABLET | Freq: Every day | ORAL | Status: DC

## 2024-10-14 MED ORDER — TAMSULOSIN HCL 0.4 MG PO CAPS: 0.4000 mg | ORAL_CAPSULE | Freq: Every day | ORAL | Status: DC

## 2024-10-14 MED ORDER — QUETIAPINE FUMARATE 25 MG PO TABS: 12.5000 mg | ORAL_TABLET | Freq: Every morning | ORAL | 0 refills | Status: AC

## 2024-10-14 MED ORDER — METFORMIN HCL 500 MG PO TABS
500.0000 mg | ORAL_TABLET | Freq: Two times a day (BID) | ORAL | Status: DC
  Administered 2024-10-14: 500 mg via ORAL
  Filled 2024-10-14: qty 1

## 2024-10-14 MED ORDER — ASPIRIN 81 MG PO TBEC
81.0000 mg | DELAYED_RELEASE_TABLET | Freq: Every day | ORAL | Status: DC
  Administered 2024-10-14: 81 mg via ORAL
  Filled 2024-10-14: qty 1

## 2024-10-14 MED ORDER — INSULIN ASPART 100 UNIT/ML IJ SOLN: 0.0000 [IU] | Freq: Three times a day (TID) | INTRAMUSCULAR | Status: DC

## 2024-10-14 MED ORDER — ATORVASTATIN CALCIUM 40 MG PO TABS
40.0000 mg | ORAL_TABLET | Freq: Every day | ORAL | Status: DC
  Administered 2024-10-14: 40 mg via ORAL
  Filled 2024-10-14: qty 1

## 2024-10-14 NOTE — ED Notes (Signed)
 Patient resting quietly in bed with eyes closed. Respirations even and unlabored, noted rise and fall of chest with each breath, no acute distress noted at this time.

## 2024-10-14 NOTE — ED Notes (Signed)
 [facility] called and spoke with Rosina the administrator of [facility], pts facility, to inform them that pt is being psychiatrically cleared and will return back to the facility later this afternoon. Rosina requested a copy of pts discharge summary to be sent with pt.   Chesley Holt, [facility]  10/14/24

## 2024-10-14 NOTE — ED Provider Notes (Addendum)
 Emergency Medicine Observation Re-evaluation Note  Karl Lawson. is a 78 y.o. male, seen on rounds today.  Pt initially presented to the ED for complaints of Psychiatric Evaluation Currently, the patient is in his room eating his breakfast.  Physical Exam  BP (!) 161/59 (BP Location: Right Arm)   Pulse 66   Temp 98 F (36.7 C) (Oral)   Resp 18   SpO2 95%  Physical Exam General: Alert Cardiac: Normal rate Lungs: Normal effort Psych: Mood is appropriate  ED Course / MDM  EKG:   I have reviewed the labs performed to date as well as medications administered while in observation.  Recent changes in the last 24 hours include psychiatry eval recommending reevaluation in the morning.  Upon my assessment of the patient, he is pleasant, calm and cooperative.  Has resting tremor consistent with his Parkinson's disease.  When I noticed the patient's veteran had, briefly discussed his time of service with him.  I believe patient should be appropriate for return back to his facility.  Reassessment 11:15 AM-patient has been cleared by psychiatry, will return to [facility].  Plan  Current plan is for psych drucie Mannie Fairy ONEIDA, DO 10/14/24 0854    Mannie Fairy T, DO 10/14/24 1114

## 2024-10-14 NOTE — ED Notes (Addendum)
 [facility] called pts son, Wynter Grave to inform him that pt has been psych cleared and will be returning back to [facility] this afternoon. [facility] left a HIPAA compliant message to return the call. Pts son returned the call and was informed that pt has been cleared and is on his way back to his facility. Pts son was appreciative of the call.   Chesley Holt, [facility]   10/14/24

## 2024-10-14 NOTE — ED Notes (Addendum)
 Patient awake at this time. Patient came outside of room looking for the bathroom. Patient was redirected back to the room and showed where the bathroom was. Patient laid back himself back to sleep after using the bathroom.
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
## 2024-10-21 NOTE — Progress Notes (Unsigned)
 Assessment/Plan:   1.  Parkinsons disease, diagnosed September, 2023             -Continue carbidopa /levodopa  25/100, 1.5 tablet 3 times per day but change timing to 7am/11am/4pm.  Discussed timing in relationship to food.  -discussed exercise and info to community programs discussed  -discussed walker at all times.  Discussed type of walker.  He has never fallen with a walker  2.  Alcohol  use             - He is in assisted living and no longer drinking alcohol  at all.   3.  Bilateral ptosis versus pseudoptosis             -Acetylcholine receptor antibodies were negative             -Patient is already seeing both ophthalmology as well as a retina specialist for macular degeneration   4.  Probable diabetic peripheral neuropathy             -Patient sees endocrinology outside of our system.    5.  LBP  -seeing sports med, Dr. Cleatrice and Dr. Louis  6.  History of hypertension  -On multiple antihypertensives.  Blood pressure was low in the hospital and he was orthostatic in the hospital.  He and I discussed concept of permissive hypertension in Parkinsons disease.  Patient is multiple antihypertensives.  7.  UTI's  - Follows with [facility].  8.  Uncontrolled diabetes  - not ideal but improving  9.  Hallucinations with day/night reversal  -d/c gabapentin  -start seroquel  25 mg q hs x 1 month and then 1/2 q am and 1 at bed  - Discussed importance of regular, daily schedule and discussed exactly what that meant.  Limit napping in the day  Subjective:   Karl Lawson. was seen today in follow up for Parkinsons disease.  My previous records were reviewed prior to todays visit as well as outside records available to me.  Pt with sister in law who supplements the history.   Last visit, we talked about the importance of using his walker at all times, as he had never fallen with a walker.  He reports today that ***.  Last visit, we started the patient on quetiapine  for  hallucinations and day/night reversal.  About a month later, his son called and stated that he would like a prescription for Nuplazid.  His son had not been to the visit (his daughter-in-law had) and we tried to call him back several times as well as sent a MyChart message that went unread regarding the similarities between quetiapine  and Nuplazid.  Eventually, we were able to get a hold of his son and ultimately the decision was made to stay on the quetiapine .  Patient did go to the emergency room on December 7 from his nursing facility with aggressive behavior with confusion and hallucinations, and son thought perhaps the patient had a urinary tract infection.  The urinalysis did not particularly look suspicious for infection.  Patient was given Geodon  injection, Risperdal  tablet and Ativan  and screened by psychiatry and felt safe to be discharged.  It does appear that the nurse practitioner has increased his quetiapine  so that he is not taking 25 mg, 1.5 tablets in the morning and 1.5 tablets in the evening.  Current prescribed movement disorder medications: Carbidopa /levodopa  25/100, 1.5 tablets 3 times per day Quetiapine  25 mg, 1.5 tablets in the morning and 1.5 tablets in the evening  Prior medications: Hallucinations with Robitussin; quetiapine  (started in the hospital when he had urosepsis)  ALLERGIES:  No Known Allergies  CURRENT MEDICATIONS:  No outpatient medications have been marked as taking for the 10/23/24 encounter (Appointment) with Evangela Heffler, Asberry RAMAN, DO.     Objective:   PHYSICAL EXAMINATION:    VITALS:   There were no vitals filed for this visit.    GEN:  The patient appears stated age and is in NAD. HEENT:  Normocephalic, atraumatic.  The mucous membranes are moist. The superficial temporal arteries are without ropiness or tenderness. CV:  rrr Lungs:  CTAB Neck/HEME:  There are no carotid bruits bilaterally.   Neurological examination:   Orientation: The patient is  alert and oriented x3.  Cranial nerves: There is good facial symmetry.  There is bilateral ptosis vs pseudoptosis.  There is hypomimia with lips parted at times.  The speech is fluent and clear. Soft palate rises symmetrically and there is no tongue deviation. Hearing is intact to conversational tone. Sensation: Sensation is intact to light touch throughout  Motor: Strength is at least antigravity x 4   Movement examination: Tone: There is mild increased in the RLE Abnormal movements: none Coordination:  There is no decremation with any form of RAMS, including alternating supination and pronation of the forearm, hand opening and closing, finger taps, heel taps.  Mild decremation with toe taps on the L Gait and Station: The patient pushes off to arise.  He slightly drags the L leg.  I have reviewed and interpreted the following labs independently    Chemistry      Component Value Date/Time   NA 139 10/13/2024 1109   NA 142 05/17/2022 1354   K 3.3 (L) 10/13/2024 1109   CL 104 10/13/2024 1109   CO2 21 (L) 10/13/2024 1109   BUN 15 10/13/2024 1109   BUN 10 05/17/2022 1354   CREATININE 0.89 10/13/2024 1109      Component Value Date/Time   CALCIUM  10.2 10/13/2024 1109   ALKPHOS 122 10/13/2024 1109   AST 11 (L) 10/13/2024 1109   ALT 17 10/13/2024 1109   BILITOT 1.1 10/13/2024 1109       Lab Results  Component Value Date   WBC 8.5 10/13/2024   HGB 14.1 10/13/2024   HCT 44.3 10/13/2024   MCV 83.1 10/13/2024   PLT 202 10/13/2024    Lab Results  Component Value Date   TSH 2.134 11/24/2023     Total time spent on today's visit was *** minutes, including both face-to-face time and nonface-to-face time.  Time included that spent on review of records (prior notes available to me/labs/imaging if pertinent), discussing treatment and goals, answering patient's questions and coordinating care.  Cc:  Berneta Elsie Sayre, MD

## 2024-10-23 ENCOUNTER — Ambulatory Visit: Admitting: Neurology

## 2024-10-23 ENCOUNTER — Encounter: Payer: Self-pay | Admitting: Neurology

## 2024-10-23 VITALS — BP 126/74 | HR 74 | Wt 205.0 lb

## 2024-10-23 DIAGNOSIS — G20A1 Parkinson's disease without dyskinesia, without mention of fluctuations: Secondary | ICD-10-CM | POA: Diagnosis not present

## 2024-10-23 DIAGNOSIS — R441 Visual hallucinations: Secondary | ICD-10-CM | POA: Diagnosis not present

## 2024-10-23 MED ORDER — CARBIDOPA-LEVODOPA ER 25-100 MG PO TBCR: EXTENDED_RELEASE_TABLET | ORAL | 1 refills | Status: AC

## 2024-10-23 NOTE — Patient Instructions (Signed)
°  VISIT SUMMARY: Today, we discussed your ongoing issues with hallucinations and sleep disturbances related to your Parkinson's disease. We also reviewed your medication regimen and made some adjustments to help manage your symptoms better. Additionally, we talked about the importance of using your walker consistently to prevent falls.  YOUR PLAN: -PARKINSON'S DISEASE WITH HALLUCINATIONS AND SLEEP DISTURBANCE: Parkinson's disease is a disorder of the nervous system that affects movement, and it can sometimes cause hallucinations and sleep problems. We have adjusted your medication regimen to help manage these symptoms better. You will now take 2 tablets of carbidopa -levodopa  25/100 ER at 7 AM, 2 tablets at 11 AM, and 1 tablet at 4 PM. We have also referred you to psychiatry for further management of your hallucinations and antipsychotic medication. It is important to use your walker consistently to prevent falls, and adding tennis balls to the wheels can help make it easier to use.  -AGE-RELATED MACULAR DEGENERATION: Age-related macular degeneration is an eye condition that can cause vision loss in older adults. We will continue to monitor this condition and manage it as needed.  INSTRUCTIONS: Please follow up with the psychiatry department for further management of your hallucinations and antipsychotic medication. Make sure to take your carbidopa -levodopa  as prescribed: 2 tablets at 7 AM, 2 tablets at 11 AM, and 1 tablet at 4 PM. Use your walker consistently to prevent falls, and consider adding tennis balls to the wheels for easier use.                     Contains text generated by Abridge.                                 Contains text generated by Abridge.

## 2024-10-25 ENCOUNTER — Ambulatory Visit: Admitting: Family Medicine

## 2025-01-03 ENCOUNTER — Encounter

## 2025-01-06 ENCOUNTER — Ambulatory Visit

## 2025-02-06 ENCOUNTER — Ambulatory Visit: Admitting: Family Medicine

## 2025-04-04 ENCOUNTER — Encounter

## 2025-04-07 ENCOUNTER — Ambulatory Visit

## 2025-05-01 ENCOUNTER — Ambulatory Visit: Payer: Self-pay | Admitting: Neurology

## 2025-07-04 ENCOUNTER — Encounter

## 2025-07-07 ENCOUNTER — Ambulatory Visit
